# Patient Record
Sex: Male | Born: 1935 | Race: White | Hispanic: No | Marital: Married | Smoking: Former smoker
Health system: Southern US, Community
[De-identification: ages and names within clinical notes are randomized; demographics above are authoritative.]

## PROBLEM LIST (undated history)

## (undated) DIAGNOSIS — H269 Unspecified cataract: Secondary | ICD-10-CM

## (undated) DIAGNOSIS — Z8739 Personal history of other diseases of the musculoskeletal system and connective tissue: Secondary | ICD-10-CM

## (undated) DIAGNOSIS — N183 Chronic kidney disease, stage 3 unspecified: Secondary | ICD-10-CM

## (undated) DIAGNOSIS — I714 Abdominal aortic aneurysm, without rupture, unspecified: Secondary | ICD-10-CM

## (undated) DIAGNOSIS — M1990 Unspecified osteoarthritis, unspecified site: Secondary | ICD-10-CM

## (undated) DIAGNOSIS — M109 Gout, unspecified: Secondary | ICD-10-CM

## (undated) DIAGNOSIS — E785 Hyperlipidemia, unspecified: Secondary | ICD-10-CM

## (undated) DIAGNOSIS — Z8719 Personal history of other diseases of the digestive system: Secondary | ICD-10-CM

## (undated) DIAGNOSIS — Z923 Personal history of irradiation: Secondary | ICD-10-CM

## (undated) DIAGNOSIS — I10 Essential (primary) hypertension: Secondary | ICD-10-CM

## (undated) DIAGNOSIS — Z8619 Personal history of other infectious and parasitic diseases: Secondary | ICD-10-CM

## (undated) DIAGNOSIS — I471 Supraventricular tachycardia: Secondary | ICD-10-CM

## (undated) DIAGNOSIS — I4891 Unspecified atrial fibrillation: Secondary | ICD-10-CM

## (undated) DIAGNOSIS — I739 Peripheral vascular disease, unspecified: Secondary | ICD-10-CM

## (undated) DIAGNOSIS — Z8673 Personal history of transient ischemic attack (TIA), and cerebral infarction without residual deficits: Secondary | ICD-10-CM

## (undated) DIAGNOSIS — C61 Malignant neoplasm of prostate: Secondary | ICD-10-CM

## (undated) DIAGNOSIS — I4719 Other supraventricular tachycardia: Secondary | ICD-10-CM

## (undated) DIAGNOSIS — D649 Anemia, unspecified: Secondary | ICD-10-CM

## (undated) DIAGNOSIS — E119 Type 2 diabetes mellitus without complications: Secondary | ICD-10-CM

## (undated) DIAGNOSIS — K7460 Unspecified cirrhosis of liver: Secondary | ICD-10-CM

## (undated) HISTORY — DX: Personal history of irradiation: Z92.3

## (undated) HISTORY — DX: Essential (primary) hypertension: I10

## (undated) HISTORY — DX: Personal history of other diseases of the digestive system: Z87.19

## (undated) HISTORY — PX: APPENDECTOMY: SHX54

## (undated) HISTORY — PX: COLON SURGERY: SHX602

## (undated) HISTORY — DX: Chronic kidney disease, stage 3 (moderate): N18.3

## (undated) HISTORY — DX: Abdominal aortic aneurysm, without rupture, unspecified: I71.40

## (undated) HISTORY — PX: CAROTID ENDARTERECTOMY: SUR193

## (undated) HISTORY — PX: CHOLECYSTECTOMY: SHX55

## (undated) HISTORY — DX: Gout, unspecified: M10.9

## (undated) HISTORY — DX: Unspecified cataract: H26.9

## (undated) HISTORY — PX: PARTIAL COLECTOMY: SHX5273

## (undated) HISTORY — DX: Unspecified atrial fibrillation: I48.91

## (undated) HISTORY — DX: Other supraventricular tachycardia: I47.19

## (undated) HISTORY — DX: Supraventricular tachycardia: I47.1

## (undated) HISTORY — DX: Personal history of other diseases of the musculoskeletal system and connective tissue: Z87.39

## (undated) HISTORY — DX: Chronic kidney disease, stage 3 unspecified: N18.30

## (undated) HISTORY — DX: Abdominal aortic aneurysm, without rupture: I71.4

## (undated) HISTORY — DX: Personal history of transient ischemic attack (TIA), and cerebral infarction without residual deficits: Z86.73

## (undated) HISTORY — DX: Type 2 diabetes mellitus without complications: E11.9

## (undated) HISTORY — DX: Personal history of other infectious and parasitic diseases: Z86.19

## (undated) HISTORY — DX: Anemia, unspecified: D64.9

## (undated) HISTORY — DX: Hyperlipidemia, unspecified: E78.5

---

## 1979-05-16 HISTORY — PX: ABDOMINAL AORTIC ANEURYSM REPAIR: SUR1152

## 2009-05-31 ENCOUNTER — Ambulatory Visit: Admission: RE | Admit: 2009-05-31 | Discharge: 2009-08-22 | Payer: Self-pay | Admitting: Radiation Oncology

## 2009-09-17 ENCOUNTER — Encounter: Payer: Self-pay | Admitting: Cardiology

## 2009-12-19 ENCOUNTER — Encounter: Payer: Self-pay | Admitting: Cardiology

## 2009-12-20 ENCOUNTER — Ambulatory Visit: Payer: Self-pay | Admitting: Cardiology

## 2009-12-21 ENCOUNTER — Encounter: Payer: Self-pay | Admitting: Cardiology

## 2009-12-22 ENCOUNTER — Encounter: Payer: Self-pay | Admitting: Cardiology

## 2010-01-14 ENCOUNTER — Ambulatory Visit: Payer: Self-pay | Admitting: Cardiology

## 2010-01-14 DIAGNOSIS — I714 Abdominal aortic aneurysm, without rupture, unspecified: Secondary | ICD-10-CM | POA: Insufficient documentation

## 2010-01-14 DIAGNOSIS — I471 Supraventricular tachycardia: Secondary | ICD-10-CM | POA: Insufficient documentation

## 2010-01-14 DIAGNOSIS — N183 Chronic kidney disease, stage 3 unspecified: Secondary | ICD-10-CM | POA: Insufficient documentation

## 2010-01-14 DIAGNOSIS — I10 Essential (primary) hypertension: Secondary | ICD-10-CM | POA: Insufficient documentation

## 2010-01-14 DIAGNOSIS — E785 Hyperlipidemia, unspecified: Secondary | ICD-10-CM | POA: Insufficient documentation

## 2010-08-14 ENCOUNTER — Ambulatory Visit: Payer: Self-pay | Admitting: Cardiology

## 2010-08-29 ENCOUNTER — Encounter: Payer: Self-pay | Admitting: Cardiology

## 2010-10-14 NOTE — Consult Note (Signed)
Summary: [facility]OLOG[facility]ULT/ [facility]  [facility]PH[facility]OLOGY CONSULT/ [facility]   Imported By: Zachary George 01/14/2010 08:32:33  _____________________________________________________________________  External Attachment:    Type:   Image     Comment:   External Document

## 2010-10-14 NOTE — Assessment & Plan Note (Signed)
Summary: POST [facility] F/U-[facility]   Visit Type:  Follow-up Primary Provider:  Dr. Kyra Manges   History of Present Illness: 77 year old male presents to the office for the first time. He was seen in consultation at [facility] in early April. He had evidence of a supraventricular tachycardia at that time associated with hypotension. 2-D echocardiography is reviewed below. He was seen by nephrology for renal insufficiency, and also underwent a ventilation perfusion lung scan which was low probability for pulmonary embolus.  He reports doing well since discharge. Home blood pressure and heart rate checks have been stable with systolics generally ranging between 100 and 130, and heart rates in the 80s. He has had no palpitations or dizziness.  I reviewed his medications. It is not entirely clear whether his metoprolol is short acting oral long-acting, however he is taking it once a day. We will try to clarify this.  Preventive Screening-Counseling & Management  Alcohol-Tobacco     Smoking Status: current  Current Medications (verified): 1)  Simvastatin 40 Mg Tabs (Simvastatin) .... Take 1 Tab Daily 2)  Lopressor 50 Mg Tabs (Metoprolol Tartrate) .... Take 1 Tab Daily 3)  Quinapril Hcl 10 Mg Tabs (Quinapril Hcl) .... Take 1 Tab Daily 4)  Gemfibrozil 600 Mg Tabs (Gemfibrozil) .... Take 1 Tab Two Times A Day 5)  Hydrochlorothiazide 50 Mg Tabs (Hydrochlorothiazide) .... Take 1 Tab Daily 6)  Bicalutamide 50 Mg Tabs (Bicalutamide) .... Take 1 Tab Daily 7)  Calcium 600 600 Mg Tabs (Calcium Carbonate) .... Take 1 Tab Daily 8)  Aspir-Low 81 Mg Tbec (Aspirin) .... Take 1 Tab Daily 9)  Daily Multiple Vitamins  Tabs (Multiple Vitamin) .... Take 1 Tab Daily  Allergies (verified): 1)  ! Morphine  Past History:  Family History: Last updated: 01/14/2010 Mother: cancer Atrial fibrillation and other family members No obvious premature cardiovascular disease  Social History: Last updated:  01/14/2010 Married  Tobacco Use - Yes Alcohol Use - yes (occasional) Relocated here from [place]  Past Medical History: Aneurysm-Aortic Hyperlipidemia Hypertension Gouty arthritis Bilateral hearing loss Chronic renal insufficiency Prostate cancer SVT  Past Surgical History: Cholecystectomy Abdominal aortic aneurysm repair, 30 years ago ([place])  Family History: Mother: cancer Atrial fibrillation and other family members No obvious premature cardiovascular disease  Social History: Married  Tobacco Use - Yes Alcohol Use - yes (occasional) [place]  [place]  Review of Systems  The patient denies anorexia, fever, weight gain, chest pain, syncope, dyspnea on exertion, peripheral edema, melena, hematochezia, and severe indigestion/heartburn.         Otherwise reviewed and negative.  Vital Signs:  Patient profile:   77 year old male Height:      71 inches Weight:      176 pounds BMI:     24.64 Pulse rate:   89 / minute BP sitting:   115 / 63  (right arm)  Vitals Entered By: Dreama Saa, CNA (Jan 14, 2010 2:11 PM)  Physical Exam  Additional Exam:  Overweight male in no acute distress. HEENT: Conjunctiva and lids normal, oropharynx clear. Neck: Supple, no elevated JVP or loud carotid bruits. Lungs: Clear to auscultation, nonlabored. Cardiac: Regular rate and rhythm, no S3. Abdomen: Soft, nontender, bowel sounds present. Extremities: No pitting edema, distal pulses 1-2+.   Echocardiogram  Procedure date:  12/20/2009  Findings:      Mild LVH with LVEF 60-65%, diastolic dysfunction, trace mitral regurgitation, aortic valve sclerosis without stenosis, mild to  moderate right ventricular enlargement with mildly reduced function, mild right atrial enlargement, RVSP 27 mm mercury.  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 No obvious recurrence since discharge from the hospital. I did ask the patient to verify his metoprolol type. They will call us with the full name. He should be on a long acting formulation if he is taking it once daily. Followup in 6 months.  His updated medication list for this problem includes:    Lopressor 50 Mg Tabs (Metoprolol tartrate) .Marland Kitchen... Take 1 tab daily    Quinapril Hcl 10 Mg Tabs (Quinapril hcl) .Marland Kitchen... Take 1 tab daily    Aspir-low 81 Mg Tbec (Aspirin) .Marland Kitchen... Take 1 tab daily  Problem # 2:  ESSENTIAL HYPERTENSION, BENIGN (ICD-401.1)  Blood pressure stable as an outpatient, and well-controlled today.  His updated medication list for this problem includes:    Lopressor 50 Mg Tabs (Metoprolol tartrate) .Marland Kitchen... Take 1 tab daily    Quinapril Hcl 10 Mg Tabs (Quinapril hcl) .Marland Kitchen... Take 1 tab daily    Hydrochlorothiazide 50 Mg Tabs (Hydrochlorothiazide) .Marland Kitchen... Take 1 tab daily    Aspir-low 81 Mg Tbec (Aspirin) .Marland Kitchen... Take 1 tab daily  Patient Instructions: 1)  Your physician wants you to follow-up in: 6 months. You will receive a reminder letter in the mail one-two months in advance. If you don't receive a letter, please call our office to schedule the follow-up appointment. 2)  Please call the office to notify the nurse Antony Contras) of what type of metoprolol you are taking.  Appended Document: [facility] Pt's family member called back to clarify that Metoprolol is tartrate not succinate. She can be reached at [phone].  Appended Document: [facility]/U-[facility] Suggest Toprol XL 50 mg by mouth once daily - this is also what was listed on the hospital D/C summary.  Appended Document: POST [facility]/U-[facility] Pt's wife notified and verbalized understanding.   Clinical Lists Changes  Medications: Changed medication from LOPRESSOR 50 MG TABS (METOPROLOL TARTRATE) take 1 tab daily to METOPROLOL SUCCINATE 50 MG XR24H-TAB (METOPROLOL SUCCINATE) Take one tablet by mouth daily -  Signed Rx of METOPROLOL SUCCINATE 50 MG XR24H-TAB (METOPROLOL SUCCINATE) Take one tablet by mouth daily;  #30 x 3;  Signed;  Entered by: Cyril Loosen, RN, BSN;  Authorized by: Loreli Slot, MD, [facility];  Method used: Electronically to [facility] [phone]*, [address] [place], [place], [place]  [address], Ph: [phone] or [phone], Fax: [phone]    Prescriptions: METOPROLOL SUCCINATE 50 MG XR24H-TAB (METOPROLOL SUCCINATE) Take one tablet by mouth daily  #30 x 3   Entered by:   Cyril Loosen, RN, BSN   Authorized by:   Loreli Slot, MD, [facility]   Signed by:   Cyril Loosen, RN, BSN on 01/15/2010   Method used:   Electronically to        [facility] [phone]* (retail)       [address]       [place], [place]  [address]       Ph: [phone] or [phone]       Fax: [phone]   RxID:   [id number]

## 2010-10-14 NOTE — Letter (Signed)
Summary: [facility] D[facility]C DR. Wende Crease  [facility]C DR. Wende Crease   Imported By: Zachary George 01/14/2010 08:32:03  _____________________________________________________________________  External Attachment:    Type:   Image     Comment:   External Document

## 2010-10-14 NOTE — Letter (Signed)
Summary: Appointment -missed  [facility] [address]   [place], [place] [address]   Phone: [phone]  Fax: [phone]     August 14, 2010 MRN: [id number]     Erik Mullins [address] [place], [place]  [address]     Dear [name],  Our records indicate you missed your appointment on August 14, 2010                        with Dr.   Diona Browner .   It is very important that we reach you to reschedule this appointment. We look forward to participating in your health care needs.   Please contact us at the number listed above at your earliest convenience to reschedule this appointment.   Sincerely,    Glass blower/designer

## 2010-10-14 NOTE — Consult Note (Signed)
Summary: CARDIOLOGY CONSULT/ [facility]  [facility]OLOGY [facility]/ [facility]   Imported By: Zachary George 01/14/2010 08:29:22  _____________________________________________________________________  External Attachment:    Type:   Image     Comment:   External Document

## 2010-10-14 NOTE — Assessment & Plan Note (Signed)
Summary: 6 MO FUL   Visit Type:  Follow-up Primary Provider:  Dr. Kyra Manges   History of Present Illness: 77 year old male presents for followup. He was seen back in May of this year. He reports doing well, feeling better with more energy. He continues to play golf regularly, in fact walks some of the holes. He is not reporting any angina, no significant palpitations, dizziness, or syncope. Home blood pressure checks are actually a bit on the high side, but today's blood pressure looked quite good.  He reports compliance with his medications. States he is due to see Dr. Elana Alm later this month for a full battery of labs.  Clinical Review Panels:  Echocardiogram Echocardiogram Mild LVH with LVEF 60-65%, diastolic dysfunction, trace mitral regurgitation, aortic valve sclerosis without stenosis, mild to moderate right ventricular enlargement with mildly reduced function, mild right atrial enlargement, RVSP 27 mm mercury. (12/20/2009)    Preventive Screening-Counseling & Management  Alcohol-Tobacco     Smoking Status: current     Smoking Cessation Counseling: yes     Packs/Day: 1/2 PPD  Current Medications (verified): 1)  Simvastatin 40 Mg Tabs (Simvastatin) .... Take 1 Tab Daily 2)  Metoprolol Tartrate 25 Mg Tabs (Metoprolol Tartrate) .... Take 1 Tablet By Mouth Once A Day 3)  Quinapril Hcl 10 Mg Tabs (Quinapril Hcl) .... Take 1 Tab Daily 4)  Gemfibrozil 600 Mg Tabs (Gemfibrozil) .... Take 1 Tab Two Times A Day 5)  Hydrochlorothiazide 25 Mg Tabs (Hydrochlorothiazide) .... Take 1 Tablet By Mouth Once A Day 6)  Bicalutamide 50 Mg Tabs (Bicalutamide) .... Take 1 Tab Daily 7)  Caltrate 600+d Plus 600-400 Mg-Unit Tabs (Calcium Carbonate-Vit D-Min) .... Take 1 Tablet By Mouth Once A Day 8)  Aspir-Low 81 Mg Tbec (Aspirin) .... Take 1 Tab Daily 9)  Daily Multiple Vitamins  Tabs (Multiple Vitamin) .... Take 1 Tab Daily 10)  Allopurinol 100 Mg Tabs (Allopurinol) .... Take 1 Tablet By Mouth  Once A Day  Allergies (verified): 1)  ! Morphine  Comments:  Nurse/Medical Assistant: The patient's medication list and allergies were reviewed with the patient and were updated in the Medication and Allergy Lists.  Past History:  Social History: Last updated: 08/14/2010 Married  Relocated to this area from [place] Tobacco Use - Yes Alcohol Use - yes  Past Medical History: Hypertension Prostate Cancer (in remission) Hyperlipidemia Remote AAA (repair approx. 30 yrs ago in [place]) Anemia Tobacco Use Disorder Kidney Disease History of gout Possible AVNRT  Social History: Married  Relocated to this area from [place] Tobacco Use - Yes Alcohol Use - yes Packs/Day:  1/2 PPD  Review of Systems  The patient denies anorexia, fever, chest pain, syncope, dyspnea on exertion, peripheral edema, melena, and hematochezia.         Otherwise reviewed and negative.  Vital Signs:  Patient profile:   77 year old male Height:      71 inches Weight:      174 pounds Pulse rate:   83 / minute BP sitting:   114 / 79  (left arm) Cuff size:   large  Vitals Entered By: Carlye Grippe (August 14, 2010 11:39 AM)  Physical Exam  Additional Exam:  Overweight male in no acute distress. HEENT: Conjunctiva and lids normal, oropharynx clear. Neck: Supple, no elevated JVP or loud carotid bruits. Lungs: Clear to auscultation, nonlabored. Cardiac: Regular rate and rhythm, no S3. Abdomen: Soft, nontender, bowel sounds present. Extremities: No pitting edema, distal pulses  1-2+.   EKG  Procedure date:  08/14/2010  Findings:      Sinus rhythm at 68 beats per minute with occasional PACs and sinus arrhythmia.  Impression & Recommendations:  Problem # 1:  PSVT (ICD-427.0)  Well-controlled on present regimen. Followup in 6 months, sooner if needed.  His updated medication list for this problem includes:    Metoprolol Tartrate 25 Mg Tabs (Metoprolol  tartrate) .Marland Kitchen... Take 1 tablet by mouth once a day    Quinapril Hcl 10 Mg Tabs (Quinapril hcl) .Marland Kitchen... Take 1 tab daily    Aspir-low 81 Mg Tbec (Aspirin) .Marland Kitchen... Take 1 tab daily  Problem # 2:  ESSENTIAL HYPERTENSION, BENIGN (ICD-401.1)  Blood pressure well-controlled today, no changes made.  His updated medication list for this problem includes:    Metoprolol Tartrate 25 Mg Tabs (Metoprolol tartrate) .Marland Kitchen... Take 1 tablet by mouth once a day    Quinapril Hcl 10 Mg Tabs (Quinapril hcl) .Marland Kitchen... Take 1 tab daily    Hydrochlorothiazide 25 Mg Tabs (Hydrochlorothiazide) .Marland Kitchen... Take 1 tablet by mouth once a day    Aspir-low 81 Mg Tbec (Aspirin) .Marland Kitchen... Take 1 tab daily  Orders: EKG w/ Interpretation (93000)  Problem # 3:  HYPERLIPIDEMIA (ICD-272.4)  Patient has followup with primary care later in the month for repeat lab work.  His updated medication list for this problem includes:    Simvastatin 40 Mg Tabs (Simvastatin) .Marland Kitchen... Take 1 tab daily    Gemfibrozil 600 Mg Tabs (Gemfibrozil) .Marland Kitchen... Take 1 tab two times a day  Patient Instructions: 1)  Your physician wants you to follow-up in: 6 months. You will receive a reminder letter in the mail one-two months in advance. If you don't receive a letter, please call our office to schedule the follow-up appointment. 2)  Your physician recommends that you continue on your current medications as directed. Please refer to the Current Medication list given to you today.

## 2010-10-14 NOTE — Letter (Signed)
Summary: [facility]  [facility]   Imported By: Zachary George 01/14/2010 08:33:03  _____________________________________________________________________  External Attachment:    Type:   Image     Comment:   External Document

## 2010-12-14 DEATH — deceased

## 2011-01-06 ENCOUNTER — Encounter: Payer: Self-pay | Admitting: Cardiology

## 2011-02-24 ENCOUNTER — Encounter: Payer: Self-pay | Admitting: Cardiology

## 2011-02-24 ENCOUNTER — Ambulatory Visit: Payer: Self-pay | Admitting: Cardiology

## 2011-03-10 ENCOUNTER — Encounter: Payer: Self-pay | Admitting: Cardiology

## 2011-03-10 ENCOUNTER — Ambulatory Visit (INDEPENDENT_AMBULATORY_CARE_PROVIDER_SITE_OTHER): Payer: Medicare Other | Admitting: Cardiology

## 2011-03-10 VITALS — BP 104/70 | HR 92 | Ht 71.0 in | Wt 167.0 lb

## 2011-03-10 DIAGNOSIS — R42 Dizziness and giddiness: Secondary | ICD-10-CM

## 2011-03-10 DIAGNOSIS — I471 Supraventricular tachycardia: Secondary | ICD-10-CM

## 2011-03-10 DIAGNOSIS — E785 Hyperlipidemia, unspecified: Secondary | ICD-10-CM

## 2011-03-10 DIAGNOSIS — I10 Essential (primary) hypertension: Secondary | ICD-10-CM

## 2011-03-10 NOTE — Progress Notes (Signed)
Clinical Summary Erik Mullins is a 77 y.o.male presenting for followup. He was seen in December 2011. Previously followed by Dr. Elana Alm.  He describes episodes of weakness and mild dizziness with documented low blood pressures, systolics sometimes in the 80-100 range. He still plays golf regularly, even in the hot weather, admits that he does not drink fluids regularly. He is on a diuretic as noted below, and it is certainly likely that he has propensity for relative dehydration and symptomatically hypotension. We discussed this today.  Otherwise his blood pressure is very well controlled. He reports no significant palpitations or chest pain. No reported claudication symptoms.   Allergies  Allergen Reactions  . Morphine     Current outpatient prescriptions:allopurinol (ZYLOPRIM) 100 MG tablet, Take 100 mg by mouth daily.  , Disp: , Rfl: ;  aspirin (ASPIR-LOW) 81 MG EC tablet, Take 81 mg by mouth daily.  , Disp: , Rfl: ;  bicalutamide (CASODEX) 50 MG tablet, Take 50 mg by mouth daily.  , Disp: , Rfl: ;  Calcium Carbonate-Vitamin D (CALTRATE 600+D) 600-400 MG-UNIT per tablet, Take 1 tablet by mouth daily.  , Disp: , Rfl:  DAILY MULTIPLE VITAMINS PO, Take by mouth. 1 tablet daily , Disp: , Rfl: ;  gemfibrozil (LOPID) 600 MG tablet, Take 600 mg by mouth 2 (two) times daily.  , Disp: , Rfl: ;  hydrochlorothiazide 25 MG tablet, Take 25 mg by mouth daily.  , Disp: , Rfl: ;  metoprolol tartrate (LOPRESSOR) 25 MG tablet, Take 25 mg by mouth daily.  , Disp: , Rfl: ;  quinapril (ACCUPRIL) 10 MG tablet, Take 10 mg by mouth daily.  , Disp: , Rfl:  simvastatin (ZOCOR) 40 MG tablet, Take 40 mg by mouth daily.  , Disp: , Rfl:   Past Medical History  Diagnosis Date  . Essential hypertension, benign   . History of prostate cancer   . Hyperlipidemia   . Anemia   . Chronic kidney disease   . History of gout   . 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 AAA (abdominal aortic aneurysm)     Remote     Social History Erik Mullins reports that he has been smoking Cigarettes.  He has a 27.5 pack-year smoking history. He quit smokeless tobacco use about 51 years ago. His smokeless tobacco use included Chew. Erik Mullins reports that he drinks alcohol.  Review of Systems Otherwise negative except as outlined.  Physical Examination Filed Vitals:   03/10/11 0900  BP: 104/70  Pulse: 92   Overweight male in no acute distress.  HEENT: Conjunctiva and lids normal, oropharynx clear.  Neck: Supple, no elevated JVP or loud carotid bruits.  Lungs: Clear to auscultation, nonlabored.  Cardiac: Regular rate and rhythm, no S3.  Abdomen: Soft, nontender, bowel sounds present.  Skin: Scattered tattoos, no ulcerations. Extremities: No pitting edema, distal pulses 1-2+. Musculoskeletal: No kyphosis. Neuropsychiatric: Alert and oriented x3, affect appropriate.   ECG Normal sinus rhythm at 78 beats per minute.  Studies Echocardiogram 12/20/2009: Mild LVH with LVEF 60-65%, diastolic dysfunction, trace mitral regurgitation, aortic valve sclerosis without stenosis, mild to moderate right ventricular enlargement with mildly reduced function, mild right atrial enlargement, RVSP 27 mm mercury.  Problem List and Plan

## 2011-03-10 NOTE — Assessment & Plan Note (Signed)
Suspect that this is related to symptomatic hypotension in the setting of relative dehydration. We reviewed his antihypertensive regimen, and at this point plan to discontinue hydrochlorothiazide. He will continue to check blood pressures at home and let us know how he is doing.

## 2011-03-10 NOTE — Patient Instructions (Signed)
Follow up as scheduled. Stop HCTZ Your physician discussed the hazards of tobacco use. Tobacco use cessation is recommended and techniques and options to help you quit were discussed.

## 2011-03-10 NOTE — Assessment & Plan Note (Signed)
Patient continues followup with primary care.

## 2011-03-10 NOTE — Assessment & Plan Note (Signed)
Symptomatically stable without any significant palpitations. ECG shows normal sinus rhythm. Continue beta blocker therapy.

## 2011-03-10 NOTE — Assessment & Plan Note (Signed)
Seems to be very well controlled.

## 2011-06-09 ENCOUNTER — Encounter: Payer: Self-pay | Admitting: Cardiology

## 2011-06-10 ENCOUNTER — Ambulatory Visit (INDEPENDENT_AMBULATORY_CARE_PROVIDER_SITE_OTHER): Payer: Medicare Other | Admitting: Cardiology

## 2011-06-10 ENCOUNTER — Encounter: Payer: Self-pay | Admitting: Cardiology

## 2011-06-10 VITALS — BP 133/72 | HR 77 | Resp 16 | Ht 71.0 in | Wt 168.0 lb

## 2011-06-10 DIAGNOSIS — R42 Dizziness and giddiness: Secondary | ICD-10-CM

## 2011-06-10 DIAGNOSIS — I471 Supraventricular tachycardia: Secondary | ICD-10-CM

## 2011-06-10 DIAGNOSIS — I10 Essential (primary) hypertension: Secondary | ICD-10-CM

## 2011-06-10 NOTE — Progress Notes (Signed)
Clinical Summary [name] is a 77 y.o.male presenting for followup. He was seen back in June.  He reports feeling better, less lightheadedness since discontinuing diuretic for his blood pressure. He brings in a home blood pressure log that shows no sustained hypotension, one systolic blood pressure in the 90s. He has had a high systolic pressure in the 140s on one occasion. I asked him to keep an eye on this.  He reports no significant palpitations. Continues to play golf regularly.   Allergies  Allergen Reactions  . Morphine     Medication list reviewed.  Past Medical History  Diagnosis Date  . Essential hypertension, benign   . History of prostate cancer   . Hyperlipidemia   . Anemia   . Chronic kidney disease   . History of gout   . 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 AAA (abdominal aortic aneurysm)     Remote    Past Surgical History  Procedure Date  . Abdominal aortic aneurysm repair     Approximately 30 yrs ago in [place]  . Partial colectomy   . Cholecystectomy     Family History  Problem Relation Age of Onset  . Cancer Mother   . Arrhythmia Other     Atrial fibrillation    Social History [name] reports that he has been smoking Cigarettes.  He has a 27.5 pack-year smoking history. He quit smokeless tobacco use about 51 years ago. His smokeless tobacco use included Chew. [name] reports that he drinks alcohol.  Review of Systems Otherwise review negative except as outlined.  Physical Examination Filed Vitals:   06/10/11 1342  BP: 133/72  Pulse: 77  Resp: 16   Overweight male in no acute distress.  HEENT: Conjunctiva and lids normal, oropharynx clear.  Neck: Supple, no elevated JVP or loud carotid bruits.  Lungs: Clear to auscultation, nonlabored.  Cardiac: Regular rate and rhythm, no S3.  Abdomen: Soft, nontender, bowel sounds present.  Skin: Scattered tattoos, no ulcerations.  Extremities: No pitting edema,  distal pulses 1-2+.  Musculoskeletal: No kyphosis.  Neuropsychiatric: Alert and oriented x3, affect appropriate.   Studies Echocardiogram 12/20/2009:  Mild LVH with LVEF 60-65%, diastolic dysfunction, trace mitral regurgitation, aortic valve sclerosis without stenosis, mild to moderate right ventricular enlargement with mildly reduced function, mild right atrial enlargement, RVSP 27 mm mercury.   Problem List and Plan

## 2011-06-10 NOTE — Assessment & Plan Note (Signed)
No changes made to present regimen. Continue home checks.

## 2011-06-10 NOTE — Assessment & Plan Note (Signed)
Quiescent, no significant palpitations. Continue beta blocker therapy.

## 2011-06-10 NOTE — Patient Instructions (Signed)
Your physician recommends that you schedule a follow-up appointment in:6 months. You will receive a reminder letter in the mail about 1-2 months in advance reminding you to call our office and schedule your appointment. If you don't receive this letter, please call our office. Your physician recommends that you continue on your current medications as directed. Please refer to the Current Medication list given to you today.

## 2011-06-10 NOTE — Assessment & Plan Note (Signed)
Resolved following discontinuation of diuretic.

## 2011-09-16 DIAGNOSIS — R059 Cough, unspecified: Secondary | ICD-10-CM | POA: Diagnosis not present

## 2011-09-16 DIAGNOSIS — R05 Cough: Secondary | ICD-10-CM | POA: Diagnosis not present

## 2011-09-16 DIAGNOSIS — J069 Acute upper respiratory infection, unspecified: Secondary | ICD-10-CM | POA: Diagnosis not present

## 2011-09-29 DIAGNOSIS — J209 Acute bronchitis, unspecified: Secondary | ICD-10-CM | POA: Diagnosis not present

## 2011-10-13 DIAGNOSIS — M109 Gout, unspecified: Secondary | ICD-10-CM | POA: Diagnosis not present

## 2011-10-13 DIAGNOSIS — N189 Chronic kidney disease, unspecified: Secondary | ICD-10-CM | POA: Diagnosis not present

## 2011-10-13 DIAGNOSIS — I10 Essential (primary) hypertension: Secondary | ICD-10-CM | POA: Diagnosis not present

## 2011-10-13 DIAGNOSIS — E785 Hyperlipidemia, unspecified: Secondary | ICD-10-CM | POA: Diagnosis not present

## 2011-12-08 ENCOUNTER — Encounter: Payer: Self-pay | Admitting: Cardiology

## 2011-12-08 ENCOUNTER — Ambulatory Visit (INDEPENDENT_AMBULATORY_CARE_PROVIDER_SITE_OTHER): Payer: Medicare Other | Admitting: Cardiology

## 2011-12-08 VITALS — BP 132/73 | HR 75 | Ht 71.0 in | Wt 183.0 lb

## 2011-12-08 DIAGNOSIS — I10 Essential (primary) hypertension: Secondary | ICD-10-CM | POA: Diagnosis not present

## 2011-12-08 DIAGNOSIS — I471 Supraventricular tachycardia: Secondary | ICD-10-CM | POA: Diagnosis not present

## 2011-12-08 NOTE — Progress Notes (Signed)
   Clinical Summary Mr. Brickle is a 78 y.o.male presenting for followup. He was seen in September 2012. He reports no progressive palpitations, occasionally feels a brief episode when he lies down at night time. Otherwise during the daytime hours he reports no dizziness with exertion, no syncope. Plays golf most days of the week. Followup ECG is reviewed below. He reports compliance with his medications.   Allergies  Allergen Reactions  . Morphine     Current Outpatient Prescriptions  Medication Sig Dispense Refill  . allopurinol (ZYLOPRIM) 100 MG tablet Take 100 mg by mouth daily.        Marland Kitchen aspirin (ASPIR-LOW) 81 MG EC tablet Take 81 mg by mouth daily.        . Calcium Carbonate-Vitamin D (CALTRATE 600+D) 600-400 MG-UNIT per tablet Take 1 tablet by mouth daily.        Marland Kitchen DAILY MULTIPLE VITAMINS PO Take by mouth. 1 tablet daily       . metoprolol tartrate (LOPRESSOR) 25 MG tablet Take 25 mg by mouth daily.        . quinapril (ACCUPRIL) 10 MG tablet Take 10 mg by mouth daily.        . simvastatin (ZOCOR) 20 MG tablet Take 20 mg by mouth every evening.      Marland Kitchen ZETIA 10 MG tablet Take 1 tablet by mouth Daily.        Past Medical History  Diagnosis Date  . Essential hypertension, benign   . History of prostate cancer   . Hyperlipidemia   . Anemia   . Chronic kidney disease   . History of gout   . 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 AAA (abdominal aortic aneurysm)     Remote    Social History Mr. Lewelling reports that he has been smoking Cigarettes.  He has a 27.5 pack-year smoking history. He quit smokeless tobacco use about 52 years ago. His smokeless tobacco use included Chew. Mr. Salzman reports that he drinks alcohol.  Review of Systems Has noticed some flare of psoriasis. Has gained weight, feels more hungry after cutting back on smoking. No orthopnea or PND. Otherwise negative.  Physical Examination Filed Vitals:   12/08/11 1347  BP: 132/73  Pulse: 75    Overweight male in no acute distress.  HEENT: Conjunctiva and lids normal, oropharynx clear.  Neck: Supple, no elevated JVP or loud carotid bruits.  Lungs: Clear to auscultation, nonlabored.  Cardiac: Regular rate and rhythm, no S3.  Abdomen: Soft, nontender, bowel sounds present.  Skin: Scattered tattoos, no ulcerations.  Extremities: No pitting edema, distal pulses 1-2+.    ECG Ectopic atrial rhythm with PACs.   Problem List and Plan

## 2011-12-08 NOTE — Assessment & Plan Note (Addendum)
Continue present medical regimen and followup with Dr. Modesto Charon. We discussed diet and exercise.

## 2011-12-08 NOTE — Assessment & Plan Note (Signed)
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 Followup ECG reviewed. For now will continue observation and plan annual followup.

## 2011-12-08 NOTE — Patient Instructions (Signed)
Your physician you to follow up in 1 year. You will receive a reminder letter in the mail one-two months in advance. If you don't receive a letter, please call our office to schedule the follow-up appointment. Your physician recommends that you continue on your current medications as directed. Please refer to the Current Medication list given to you today. 

## 2012-02-02 DIAGNOSIS — E785 Hyperlipidemia, unspecified: Secondary | ICD-10-CM | POA: Diagnosis not present

## 2012-02-02 DIAGNOSIS — I10 Essential (primary) hypertension: Secondary | ICD-10-CM | POA: Diagnosis not present

## 2012-02-02 DIAGNOSIS — E291 Testicular hypofunction: Secondary | ICD-10-CM | POA: Diagnosis not present

## 2012-02-02 DIAGNOSIS — M109 Gout, unspecified: Secondary | ICD-10-CM | POA: Diagnosis not present

## 2012-02-02 DIAGNOSIS — Z125 Encounter for screening for malignant neoplasm of prostate: Secondary | ICD-10-CM | POA: Diagnosis not present

## 2012-02-09 DIAGNOSIS — E785 Hyperlipidemia, unspecified: Secondary | ICD-10-CM | POA: Diagnosis not present

## 2012-02-09 DIAGNOSIS — M109 Gout, unspecified: Secondary | ICD-10-CM | POA: Diagnosis not present

## 2012-02-16 DIAGNOSIS — E291 Testicular hypofunction: Secondary | ICD-10-CM | POA: Diagnosis not present

## 2012-02-16 DIAGNOSIS — C61 Malignant neoplasm of prostate: Secondary | ICD-10-CM | POA: Diagnosis not present

## 2012-06-06 DIAGNOSIS — L03211 Cellulitis of face: Secondary | ICD-10-CM | POA: Diagnosis not present

## 2012-06-06 DIAGNOSIS — L0201 Cutaneous abscess of face: Secondary | ICD-10-CM | POA: Diagnosis not present

## 2012-06-09 DIAGNOSIS — E785 Hyperlipidemia, unspecified: Secondary | ICD-10-CM | POA: Diagnosis not present

## 2012-06-09 DIAGNOSIS — N289 Disorder of kidney and ureter, unspecified: Secondary | ICD-10-CM | POA: Diagnosis not present

## 2012-06-09 DIAGNOSIS — L03211 Cellulitis of face: Secondary | ICD-10-CM | POA: Diagnosis not present

## 2012-06-09 DIAGNOSIS — L0201 Cutaneous abscess of face: Secondary | ICD-10-CM | POA: Diagnosis not present

## 2012-06-09 DIAGNOSIS — I10 Essential (primary) hypertension: Secondary | ICD-10-CM | POA: Diagnosis not present

## 2012-06-27 DIAGNOSIS — L0390 Cellulitis, unspecified: Secondary | ICD-10-CM | POA: Diagnosis not present

## 2012-06-27 DIAGNOSIS — L0291 Cutaneous abscess, unspecified: Secondary | ICD-10-CM | POA: Diagnosis not present

## 2012-07-01 DIAGNOSIS — L0291 Cutaneous abscess, unspecified: Secondary | ICD-10-CM | POA: Diagnosis not present

## 2012-07-01 DIAGNOSIS — L0390 Cellulitis, unspecified: Secondary | ICD-10-CM | POA: Diagnosis not present

## 2012-08-01 DIAGNOSIS — R7989 Other specified abnormal findings of blood chemistry: Secondary | ICD-10-CM | POA: Diagnosis not present

## 2012-08-01 DIAGNOSIS — R509 Fever, unspecified: Secondary | ICD-10-CM | POA: Diagnosis not present

## 2012-08-05 ENCOUNTER — Encounter: Payer: Self-pay | Admitting: Infectious Diseases

## 2012-08-05 ENCOUNTER — Ambulatory Visit (INDEPENDENT_AMBULATORY_CARE_PROVIDER_SITE_OTHER): Payer: Medicare Other | Admitting: Infectious Diseases

## 2012-08-05 VITALS — BP 112/74 | HR 80 | Temp 97.8°F | Ht 71.5 in | Wt 184.2 lb

## 2012-08-05 DIAGNOSIS — N189 Chronic kidney disease, unspecified: Secondary | ICD-10-CM

## 2012-08-05 DIAGNOSIS — R509 Fever, unspecified: Secondary | ICD-10-CM

## 2012-08-05 LAB — CBC
HCT: 41.3 % (ref 39.0–52.0)
MCV: 87.1 fL (ref 78.0–100.0)
Platelets: 183 10*3/uL (ref 150–400)
RBC: 4.74 MIL/uL (ref 4.22–5.81)
RDW: 13.5 % (ref 11.5–15.5)
WBC: 6.9 10*3/uL (ref 4.0–10.5)

## 2012-08-05 LAB — BASIC METABOLIC PANEL
BUN: 27 mg/dL — ABNORMAL HIGH (ref 6–23)
CO2: 27 mEq/L (ref 19–32)
Chloride: 104 mEq/L (ref 96–112)
Potassium: 5.5 mEq/L — ABNORMAL HIGH (ref 3.5–5.3)

## 2012-08-05 MED ORDER — AMOXICILLIN-POT CLAVULANATE 875-125 MG PO TABS: 1.0000 | ORAL_TABLET | Freq: Two times a day (BID) | ORAL | Status: AC

## 2012-08-05 NOTE — Assessment & Plan Note (Signed)
States he has one kidney but normal function. Will check his Cr before CT scan.

## 2012-08-05 NOTE — Assessment & Plan Note (Signed)
Suspect dental source of his infections. At this point, I am unclear if he has developed deeper infection of his maxilla, mandible or both. Will change his anbx to augmentin (this will also cover unusual bugs such as actinomyces and nocardia), plan for him to get a month. Will set him up for a CT maxilofacial with contrast. See him back in 1 week.

## 2012-08-05 NOTE — Progress Notes (Signed)
  Subjective:    Patient ID: Erik Mullins, male    DOB: [birth year], 78 y.o.   MRN: [id number]  HPI 78 yo M with repeated episodes of fever and cellulitis/facial swelling. He was seen 02-09-12 with facial swelling and erythema. He had a temp of 102 and had chills. He was treated with cipro x 10 days. He was seen 06-06-12 with a R posterior ear knot. He was given ceftriaxone, cipro x 10 days. He had another episode 06-27-12 of L jaw pain and facial swelling. He was given ceftriaxone, bactrim. He returned 08-01-12 with temp (to 104?),  fever and erythema of his face.  Was given a shot (?), and then started on bactrim. Has not had any further fevers.  He has been noted to have multiple dental abscesses.   Had wisdom tooth extracted ~ 1 month ago.    Review of Systems  Constitutional: Positive for fever and chills. Negative for appetite change and unexpected weight change.  HENT: Positive for hearing loss and facial swelling.   Gastrointestinal: Negative for diarrhea and constipation.  Genitourinary: Negative for difficulty urinating.  Neurological: Negative for dizziness and headaches.      Objective:   Physical Exam  Constitutional: He appears well-developed and well-nourished.  HENT:  Head:    Mouth/Throat: He has dentures. Abnormal dentition. Uvula swelling and dental caries present. No dental abscesses. No oropharyngeal exudate.    Eyes: EOM are normal. Pupils are equal, round, and reactive to light.  Neck: Neck supple.    Cardiovascular: Normal rate, regular rhythm and normal heart sounds.   Pulmonary/Chest: Effort normal and breath sounds normal.  Abdominal: Soft. Bowel sounds are normal. There is no tenderness.  Musculoskeletal: He exhibits no edema.  Lymphadenopathy:    He has no cervical adenopathy.          Assessment & Plan:

## 2012-08-08 ENCOUNTER — Telehealth: Payer: Self-pay | Admitting: *Deleted

## 2012-08-08 DIAGNOSIS — I10 Essential (primary) hypertension: Secondary | ICD-10-CM | POA: Diagnosis not present

## 2012-08-08 NOTE — Progress Notes (Signed)
Per Dr. Ninetta Lights, ok for patient to have repeat BMP at his appt on Wednesday 08/10/12. Wendall Mola

## 2012-08-08 NOTE — Telephone Encounter (Signed)
Pt needing repeat BMP due to abnormal values.  Erik Mullins requesting BMP be drawn at Dr. Nash Dimmer office today for visit w/ Dr. Ninetta Lights on Wed., Aug 10, 2012.  RN called Dr. Nash Dimmer office and arranged for BMP at their office and for results to be faxed to Dr. Ninetta Lights.  [name] agreed to take the pt to Dr. Nash Dimmer for blood draw today.

## 2012-08-10 ENCOUNTER — Ambulatory Visit (INDEPENDENT_AMBULATORY_CARE_PROVIDER_SITE_OTHER): Payer: Medicare Other | Admitting: Infectious Diseases

## 2012-08-10 ENCOUNTER — Other Ambulatory Visit: Payer: Self-pay | Admitting: Infectious Diseases

## 2012-08-10 ENCOUNTER — Encounter: Payer: Self-pay | Admitting: Infectious Diseases

## 2012-08-10 VITALS — BP 131/81 | HR 89 | Temp 98.2°F | Ht 71.5 in | Wt 183.0 lb

## 2012-08-10 DIAGNOSIS — N189 Chronic kidney disease, unspecified: Secondary | ICD-10-CM | POA: Diagnosis not present

## 2012-08-10 DIAGNOSIS — R509 Fever, unspecified: Secondary | ICD-10-CM | POA: Diagnosis not present

## 2012-08-10 DIAGNOSIS — E876 Hypokalemia: Secondary | ICD-10-CM | POA: Diagnosis not present

## 2012-08-10 DIAGNOSIS — I10 Essential (primary) hypertension: Secondary | ICD-10-CM | POA: Diagnosis not present

## 2012-08-10 NOTE — Progress Notes (Signed)
  Subjective:    Patient ID: Erik Mullins, male    DOB: 05-23-1931, 78 y.o.   MRN: [id number]  HPI 78 yo M with repeated episodes of fever and cellulitis/facial swelling. He was seen 02-09-12 with facial swelling and erythema. He had a temp of 102 and had chills. He was treated with cipro x 10 days. He was seen 06-06-12 with a R posterior ear knot. He was given ceftriaxone, cipro x 10 days. He had another episode 06-27-12 of L jaw pain and facial swelling. He was given ceftriaxone, bactrim. He returned 08-01-12 with temp (to 104?), fever and erythema of his face. Was given a shot (?), and then started on bactrim. Has not had any further fevers.  He has been noted to have multiple dental abscesses.  Had wisdom tooth extracted ~ 1 month ago.  Had BMP for eval for his CT face, found to have Cr 1.68, K 6.1. Has solitary kidney. Has been on accupril for many years. (PMD Leodis Sias, MD). Now trying to eat fewer K+ rich food. Eats a lot of peanut butter, candy bars.  No further fevers since last visit. Facial pain is better. No further erythematous lesions on his face.    Review of Systems     Objective:   Physical Exam  Constitutional: He appears well-developed and well-nourished.  HENT:  Mouth/Throat: No oropharyngeal exudate.    Neck:            Assessment & Plan:

## 2012-08-10 NOTE — Assessment & Plan Note (Signed)
Has improved on augmentin. Plan for 1 month. Will change his CT scan of maxillofacial to without contrast. See him back after CT scan.

## 2012-08-10 NOTE — Assessment & Plan Note (Addendum)
With try to get him into see his PCP. Have him reschedule his CT scan without contrast. Query if his kidney fxn is worse from his ACE-I? His K+ elevated from same? Could be from Augmentin?

## 2012-08-16 ENCOUNTER — Ambulatory Visit (HOSPITAL_COMMUNITY)
Admission: RE | Admit: 2012-08-16 | Discharge: 2012-08-16 | Disposition: A | Discharge status: Home / Self Care | Payer: Medicare Other | Source: Ambulatory Visit | Attending: Infectious Diseases | Admitting: Infectious Diseases

## 2012-08-16 DIAGNOSIS — R220 Localized swelling, mass and lump, head: Secondary | ICD-10-CM | POA: Insufficient documentation

## 2012-08-16 DIAGNOSIS — L0201 Cutaneous abscess of face: Secondary | ICD-10-CM | POA: Diagnosis not present

## 2012-08-16 DIAGNOSIS — R509 Fever, unspecified: Secondary | ICD-10-CM

## 2012-08-16 DIAGNOSIS — L03211 Cellulitis of face: Secondary | ICD-10-CM | POA: Diagnosis not present

## 2012-08-17 DIAGNOSIS — E291 Testicular hypofunction: Secondary | ICD-10-CM | POA: Diagnosis not present

## 2012-08-17 DIAGNOSIS — R7989 Other specified abnormal findings of blood chemistry: Secondary | ICD-10-CM | POA: Diagnosis not present

## 2012-08-17 DIAGNOSIS — E876 Hypokalemia: Secondary | ICD-10-CM | POA: Diagnosis not present

## 2012-08-17 DIAGNOSIS — C61 Malignant neoplasm of prostate: Secondary | ICD-10-CM | POA: Diagnosis not present

## 2012-08-23 DIAGNOSIS — E291 Testicular hypofunction: Secondary | ICD-10-CM | POA: Diagnosis not present

## 2012-08-23 DIAGNOSIS — Z8546 Personal history of malignant neoplasm of prostate: Secondary | ICD-10-CM | POA: Diagnosis not present

## 2012-08-31 ENCOUNTER — Ambulatory Visit (INDEPENDENT_AMBULATORY_CARE_PROVIDER_SITE_OTHER): Payer: Medicare Other | Admitting: Infectious Diseases

## 2012-08-31 VITALS — BP 167/75 | HR 84 | Temp 98.0°F | Ht 72.0 in | Wt 182.0 lb

## 2012-08-31 DIAGNOSIS — R509 Fever, unspecified: Secondary | ICD-10-CM

## 2012-08-31 DIAGNOSIS — I10 Essential (primary) hypertension: Secondary | ICD-10-CM | POA: Diagnosis not present

## 2012-08-31 NOTE — Progress Notes (Signed)
  Subjective:    Patient ID: Erik Mullins, male    DOB: 06/06/1931, 78 y.o.   MRN: [id number]  HPI 78 yo M with a hx of solitary kidney and CRI, and repeated episodes of fever and cellulitis/facial swelling. He was seen 02-09-12 with facial swelling and erythema. He had a temp of 102 and had chills. He was treated with cipro x 10 days. He was seen 06-06-12 with a R posterior ear knot. He was given ceftriaxone, cipro x 10 days. He had another episode 06-27-12 of L jaw pain and facial swelling. He was given ceftriaxone, bactrim. He returned 08-01-12 with temp (to 104?), fever and erythema of his face. Was given a shot (?), and then started on bactrim. Has not had any further fevers.  He has been noted to have multiple dental abscesses.  Had wisdom tooth extracted in [place].  He underwent maxilofacial CT 08-16-12 and which was (-).  Has ben feeling well. Has 1 day of augmentin left.  No further fevers, chills. No further jaw swelling or pain.    Review of Systems     Objective:   Physical Exam  Constitutional: He appears well-developed and well-nourished.  HENT:  Mouth/Throat: No oropharyngeal exudate.            Assessment & Plan:

## 2012-08-31 NOTE — Assessment & Plan Note (Signed)
Is having his BP medications changed by his PCP due to previously elevated K+. He is asx (denies HA, CP). Has f/u appt 09-09-12.

## 2012-08-31 NOTE — Assessment & Plan Note (Signed)
Suspect this is due to his teeth. I am hopeful that his course of anbx has improved this, however he still has teeth which may benefit from extraction. I made it clear to them that I am available if he has further fevers after he comes off anbx. He will otherwise f/u with his dentist and PCP.

## 2012-09-08 DIAGNOSIS — E785 Hyperlipidemia, unspecified: Secondary | ICD-10-CM | POA: Diagnosis not present

## 2012-09-08 DIAGNOSIS — E875 Hyperkalemia: Secondary | ICD-10-CM | POA: Diagnosis not present

## 2012-09-08 DIAGNOSIS — I10 Essential (primary) hypertension: Secondary | ICD-10-CM | POA: Diagnosis not present

## 2012-09-08 DIAGNOSIS — M109 Gout, unspecified: Secondary | ICD-10-CM | POA: Diagnosis not present

## 2012-09-09 DIAGNOSIS — H2510 Age-related nuclear cataract, unspecified eye: Secondary | ICD-10-CM | POA: Diagnosis not present

## 2012-09-09 DIAGNOSIS — G44209 Tension-type headache, unspecified, not intractable: Secondary | ICD-10-CM | POA: Diagnosis not present

## 2012-09-14 HISTORY — PX: CATARACT EXTRACTION W/ INTRAOCULAR LENS  IMPLANT, BILATERAL: SHX1307

## 2012-09-30 DIAGNOSIS — I10 Essential (primary) hypertension: Secondary | ICD-10-CM | POA: Diagnosis not present

## 2012-10-07 DIAGNOSIS — R7989 Other specified abnormal findings of blood chemistry: Secondary | ICD-10-CM | POA: Diagnosis not present

## 2012-11-08 DIAGNOSIS — B356 Tinea cruris: Secondary | ICD-10-CM | POA: Diagnosis not present

## 2012-11-30 DIAGNOSIS — H2510 Age-related nuclear cataract, unspecified eye: Secondary | ICD-10-CM | POA: Diagnosis not present

## 2012-12-08 ENCOUNTER — Telehealth: Payer: Self-pay | Admitting: Family Medicine

## 2012-12-08 ENCOUNTER — Ambulatory Visit (INDEPENDENT_AMBULATORY_CARE_PROVIDER_SITE_OTHER): Payer: Medicare Other | Admitting: Family Medicine

## 2012-12-08 ENCOUNTER — Encounter: Payer: Self-pay | Admitting: Family Medicine

## 2012-12-08 VITALS — BP 110/71 | HR 79 | Temp 97.4°F | Ht 71.0 in | Wt 189.6 lb

## 2012-12-08 DIAGNOSIS — N189 Chronic kidney disease, unspecified: Secondary | ICD-10-CM | POA: Diagnosis not present

## 2012-12-08 DIAGNOSIS — E785 Hyperlipidemia, unspecified: Secondary | ICD-10-CM

## 2012-12-08 DIAGNOSIS — B356 Tinea cruris: Secondary | ICD-10-CM | POA: Diagnosis not present

## 2012-12-08 DIAGNOSIS — I10 Essential (primary) hypertension: Secondary | ICD-10-CM

## 2012-12-08 MED ORDER — DOXYCYCLINE HYCLATE 100 MG PO TABS: 100.0000 mg | ORAL_TABLET | Freq: Two times a day (BID) | ORAL | Status: DC

## 2012-12-08 MED ORDER — LISINOPRIL 10 MG PO TABS: 10.0000 mg | ORAL_TABLET | Freq: Every day | ORAL | Status: DC

## 2012-12-08 MED ORDER — NYSTATIN-TRIAMCINOLONE 100000-0.1 UNIT/GM-% EX OINT: TOPICAL_OINTMENT | CUTANEOUS | Status: DC

## 2012-12-08 NOTE — Telephone Encounter (Signed)
What percentage of zinc should they use for compound rx? 40 or 11.3

## 2012-12-08 NOTE — Progress Notes (Signed)
Subjective:     Patient ID: Erik Mullins, male   DOB: February 28, 1931, 79 y.o.   MRN: [id number]  HPI Rash almost gone per patient. Wife has not checked. Less itching. No fever. Notes no chills. His other medical problems are stable. He continues with his medications. He has an appointment for lab work orders need to be placed. Past Medical History  Diagnosis Date  . Essential hypertension, benign   . History of prostate cancer   . Hyperlipidemia   . Anemia   . Chronic kidney disease   . History of gout   . 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 AAA (abdominal aortic aneurysm)     Remote   Past Surgical History  Procedure Laterality Date  . Abdominal aortic aneurysm repair      Approximately 30 yrs ago in [place]  . Partial colectomy    . Cholecystectomy     History   Social History  . Marital Status: Married    Spouse Name: N/A    Number of Children: N/A  . Years of Education: N/A   Occupational History  . Not on file.   Social History Main Topics  . Smoking status: Current Every Day Smoker -- 0.30 packs/day for 55 years    Types: Cigarettes  . Smokeless tobacco: Current User    Types: Snuff, Chew     Comment: smokes 2 cigartettes per day now  . Alcohol Use: Yes     Comment: rarely  . Drug Use: No  . Sexually Active: Not on file   Other Topics Concern  . Not on file   Social History Narrative   Relocated to this area from [place].   Family History  Problem Relation Age of Onset  . Cancer Mother     uterine deceased age 31  . Arrhythmia Other     Atrial fibrillation   Current Outpatient Prescriptions on File Prior to Visit  Medication Sig Dispense Refill  . aspirin (ASPIR-LOW) 81 MG EC tablet Take 81 mg by mouth daily.        . Calcium Carbonate-Vitamin D (CALTRATE 600+D) 600-400 MG-UNIT per tablet Take 1 tablet by mouth daily.        Marland Kitchen DAILY MULTIPLE VITAMINS PO Take by mouth. 1 tablet daily       . metoprolol tartrate  (LOPRESSOR) 25 MG tablet Take 50 mg by mouth daily.       . simvastatin (ZOCOR) 20 MG tablet Take 20 mg by mouth every evening.      Marland Kitchen amoxicillin-clavulanate (AUGMENTIN) 875-125 MG per tablet       . febuxostat (ULORIC) 40 MG tablet Take 80 mg by mouth daily with breakfast.       No current facility-administered medications on file prior to visit.   Allergies  Allergen Reactions  . Morphine     There is no immunization history on file for this patient. Prior to Admission medications   Medication Sig Start Date End Date Taking? Authorizing Provider  allopurinol (ZYLOPRIM) 100 MG tablet  12/05/12  Yes Historical Provider, MD  aspirin (ASPIR-LOW) 81 MG EC tablet Take 81 mg by mouth daily.     Yes Historical Provider, MD  Calcium Carbonate-Vitamin D (CALTRATE 600+D) 600-400 MG-UNIT per tablet Take 1 tablet by mouth daily.     Yes Historical Provider, MD  DAILY MULTIPLE VITAMINS PO Take by mouth. 1 tablet daily    Yes Historical Provider, MD  ketoconazole (NIZORAL) 2 % cream  11/08/12  Yes Historical Provider, MD  lisinopril (PRINIVIL,ZESTRIL) 10 MG tablet Take 1 tablet (10 mg total) by mouth daily. 12/08/12  Yes Ileana Ladd, MD  metoprolol tartrate (LOPRESSOR) 25 MG tablet Take 50 mg by mouth daily.    Yes Historical Provider, MD  simvastatin (ZOCOR) 20 MG tablet Take 20 mg by mouth every evening.   Yes Historical Provider, MD  amoxicillin-clavulanate (AUGMENTIN) 875-125 MG per tablet  08/05/12   Historical Provider, MD  cephALEXin (KEFLEX) 500 MG capsule  11/08/12   Historical Provider, MD  doxycycline (VIBRA-TABS) 100 MG tablet Take 1 tablet (100 mg total) by mouth 2 (two) times daily. 12/08/12   Ileana Ladd, MD  febuxostat (ULORIC) 40 MG tablet Take 80 mg by mouth daily with breakfast.    Historical Provider, MD  nystatin-triamcinolone ointment (MYCOLOG) Apply twice a day to the rash. 12/08/12   Ileana Ladd, MD     Review of Systems  Constitutional: Negative.   HENT: Negative.    Eyes: Negative.   Respiratory: Negative.   Cardiovascular: Negative.   Gastrointestinal: Negative.   Endocrine: Negative.   Genitourinary: Negative.   Musculoskeletal: Negative.   Skin: Positive for rash.  Allergic/Immunologic: Negative.   Neurological: Negative.   Hematological: Negative.   Psychiatric/Behavioral: Negative.        Objective:   Physical Exam BP 110/71  Pulse 79  Temp(Src) 97.4 F (36.3 C) (Oral)  Ht 5\' 11"  (1.803 m)  Wt 189 lb 9.6 oz (86.002 kg)  BMI 26.46 kg/m2 On examination he appeared in no acute distress. Overweight Vital signs as documented.  Skin warm and dry. There is are red rash still present in the groin area and perineum. Less extensive since his last visit. Still has some satellite lesions. The proximal thigh rash has resolved. There is no open sores or weeping lesions. Overall 50% better but not resolved.  Head &Neck without JVD. Normal. Lungs clear.  Heart exam notable for regular rhythm, normal sounds and absence of murmurs, rubs or gallops.  Abdomen unremarkable and without evidence of organomegaly, masses, or abdominal aortic enlargement.  Extremities nonedematous. Neurologic: oriented to name, place, and time. Nonfocal exam.    Assessment:     HYPERLIPIDEMIA - Plan: CANCELED: Hepatic function panel, CANCELED: NMR Lipoprofile with Lipids  Essential hypertension, benign - Plan: CANCELED: BASIC METABOLIC PANEL WITH GFR  CHRONIC KIDNEY DISEASE UNSPECIFIED - Plan: CANCELED: BASIC METABOLIC PANEL WITH GFR  Tinea cruris - Plan: nystatin-triamcinolone ointment (MYCOLOG)  No new symptoms associated with his regular medical problems     Plan:     Continue present level of care with his meds chronic medical problems. Opted to change their prescription regimen for his rash. Prescribed doxycycline 100 mg twice a day for 10 days in case this is really erythrasma. However, the satellite lesions are reflective of candidiasis. Skin care. Also  prescribed the Goo, compounded by the pharmacy. The routine is seeing him for followup in 3 months. Will order his labs in the next month.  Yeiden Frenkel P. Modesto Charon, M.D.

## 2012-12-08 NOTE — Telephone Encounter (Signed)
[facility] notified and per Dr Modesto Charon use zinc 11.3 %

## 2012-12-13 ENCOUNTER — Ambulatory Visit (INDEPENDENT_AMBULATORY_CARE_PROVIDER_SITE_OTHER): Payer: Medicare Other | Admitting: Cardiology

## 2012-12-13 ENCOUNTER — Encounter: Payer: Self-pay | Admitting: Cardiology

## 2012-12-13 VITALS — BP 105/69 | HR 78 | Ht 71.0 in | Wt 186.0 lb

## 2012-12-13 DIAGNOSIS — I471 Supraventricular tachycardia: Secondary | ICD-10-CM

## 2012-12-13 NOTE — Progress Notes (Signed)
   Clinical Summary Erik Mullins is a 79 y.o.male last seen in March 2013. He is here with his wife today. He reports no significant problems with palpitations, no sudden dizziness or syncope. He continues to play golf regularly. Has had some problems with decreased vision, and states that he is to have bilateral cataract surgery soon.  ECG today shows normal sinus rhythm. He continues on beta blocker.   Allergies  Allergen Reactions  . Morphine Other (See Comments)    hallucinate    Current Outpatient Prescriptions  Medication Sig Dispense Refill  . allopurinol (ZYLOPRIM) 100 MG tablet Take 100 mg by mouth daily.       Marland Kitchen aspirin (ASPIR-LOW) 81 MG EC tablet Take 81 mg by mouth daily.        . Calcium Carbonate-Vitamin D (CALTRATE 600+D) 600-400 MG-UNIT per tablet Take 1 tablet by mouth daily.        Marland Kitchen DAILY MULTIPLE VITAMINS PO Take by mouth. 1 tablet daily       . doxycycline (VIBRA-TABS) 100 MG tablet Take 100 mg by mouth 2 (two) times daily.      Marland Kitchen ketoconazole (NIZORAL) 2 % cream Apply 1 application topically 2 (two) times daily.       Marland Kitchen lisinopril (PRINIVIL,ZESTRIL) 10 MG tablet Take 1 tablet (10 mg total) by mouth daily.  90 tablet  2  . metoprolol tartrate (LOPRESSOR) 25 MG tablet Take 25 mg by mouth at bedtime.       . simvastatin (ZOCOR) 20 MG tablet Take 20 mg by mouth every evening.      . zinc oxide (BALMEX) 11.3 % CREA cream Apply 1 application topically 2 (two) times daily.       No current facility-administered medications for this visit.    Past Medical History  Diagnosis Date  . Essential hypertension, benign   . History of prostate cancer   . Hyperlipidemia   . Anemia   . Chronic kidney disease   . History of gout   . 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 AAA (abdominal aortic aneurysm)     Remote    Social History Erik Mullins reports that he has been smoking Cigarettes.  He has a 16.5 pack-year smoking history. His smokeless tobacco use  includes Snuff and Chew. Erik Mullins reports that  drinks alcohol.  Review of Systems Hard of hearing as before. Uses hearing aids. Otherwise negative except as outlined.  Physical Examination Filed Vitals:   12/13/12 0810  BP: 105/69  Pulse: 78   Filed Weights   12/13/12 0810  Weight: 186 lb (84.369 kg)    No acute distress.  HEENT: Conjunctiva and lids normal, oropharynx clear.  Neck: Supple, no elevated JVP or loud carotid bruits.  Lungs: Clear to auscultation, nonlabored.  Cardiac: Regular rate and rhythm, no S3.  Abdomen: Soft, nontender, bowel sounds present.  Skin: Scattered tattoos, no ulcerations.  Extremities: No pitting edema, distal pulses 1-2+.    Problem List and Plan   PSVT Quiescent on beta blocker. ECG reviewed, normal sinus rhythm. Continue observation, one year followup unless progressive symptoms.    Jonelle Sidle, M.D., F.A.C.C.

## 2012-12-13 NOTE — Assessment & Plan Note (Signed)
Quiescent on beta blocker. ECG reviewed, normal sinus rhythm. Continue observation, one year followup unless progressive symptoms.

## 2012-12-13 NOTE — Patient Instructions (Addendum)

## 2012-12-20 DIAGNOSIS — H269 Unspecified cataract: Secondary | ICD-10-CM | POA: Diagnosis not present

## 2012-12-20 DIAGNOSIS — H2510 Age-related nuclear cataract, unspecified eye: Secondary | ICD-10-CM | POA: Diagnosis not present

## 2012-12-20 DIAGNOSIS — Z961 Presence of intraocular lens: Secondary | ICD-10-CM | POA: Diagnosis not present

## 2012-12-20 DIAGNOSIS — H52229 Regular astigmatism, unspecified eye: Secondary | ICD-10-CM | POA: Diagnosis not present

## 2013-01-02 DIAGNOSIS — H2510 Age-related nuclear cataract, unspecified eye: Secondary | ICD-10-CM | POA: Diagnosis not present

## 2013-01-09 ENCOUNTER — Ambulatory Visit: Payer: Self-pay | Admitting: Family Medicine

## 2013-01-17 DIAGNOSIS — H2510 Age-related nuclear cataract, unspecified eye: Secondary | ICD-10-CM | POA: Diagnosis not present

## 2013-01-17 DIAGNOSIS — H26499 Other secondary cataract, unspecified eye: Secondary | ICD-10-CM | POA: Diagnosis not present

## 2013-01-19 DIAGNOSIS — H2510 Age-related nuclear cataract, unspecified eye: Secondary | ICD-10-CM | POA: Diagnosis not present

## 2013-01-19 DIAGNOSIS — H52229 Regular astigmatism, unspecified eye: Secondary | ICD-10-CM | POA: Diagnosis not present

## 2013-01-19 DIAGNOSIS — Z961 Presence of intraocular lens: Secondary | ICD-10-CM | POA: Diagnosis not present

## 2013-01-19 DIAGNOSIS — H269 Unspecified cataract: Secondary | ICD-10-CM | POA: Diagnosis not present

## 2013-01-24 ENCOUNTER — Other Ambulatory Visit: Payer: Self-pay | Admitting: Family Medicine

## 2013-02-02 DIAGNOSIS — G44209 Tension-type headache, unspecified, not intractable: Secondary | ICD-10-CM | POA: Diagnosis not present

## 2013-02-07 ENCOUNTER — Other Ambulatory Visit (INDEPENDENT_AMBULATORY_CARE_PROVIDER_SITE_OTHER): Payer: Medicare Other

## 2013-02-07 DIAGNOSIS — Z8546 Personal history of malignant neoplasm of prostate: Secondary | ICD-10-CM | POA: Diagnosis not present

## 2013-02-07 DIAGNOSIS — E785 Hyperlipidemia, unspecified: Secondary | ICD-10-CM

## 2013-02-07 DIAGNOSIS — I10 Essential (primary) hypertension: Secondary | ICD-10-CM | POA: Diagnosis not present

## 2013-02-07 DIAGNOSIS — N189 Chronic kidney disease, unspecified: Secondary | ICD-10-CM | POA: Diagnosis not present

## 2013-02-07 LAB — BASIC METABOLIC PANEL WITH GFR
BUN: 23 mg/dL (ref 6–23)
CO2: 27 mEq/L (ref 19–32)
Calcium: 8.9 mg/dL (ref 8.4–10.5)
Chloride: 108 mEq/L (ref 96–112)
Creat: 1.58 mg/dL — ABNORMAL HIGH (ref 0.50–1.35)
GFR, Est African American: 47 mL/min — ABNORMAL LOW
GFR, Est Non African American: 41 mL/min — ABNORMAL LOW
Glucose, Bld: 89 mg/dL (ref 70–99)
Potassium: 5.3 mEq/L (ref 3.5–5.3)
Sodium: 141 mEq/L (ref 135–145)

## 2013-02-07 LAB — HEPATIC FUNCTION PANEL
ALT: 16 U/L (ref 0–53)
AST: 21 U/L (ref 0–37)
Albumin: 4.1 g/dL (ref 3.5–5.2)
Alkaline Phosphatase: 81 U/L (ref 39–117)
Bilirubin, Direct: 0.1 mg/dL (ref 0.0–0.3)
Indirect Bilirubin: 0.3 mg/dL (ref 0.0–0.9)
Total Bilirubin: 0.4 mg/dL (ref 0.3–1.2)
Total Protein: 6.4 g/dL (ref 6.0–8.3)

## 2013-02-07 NOTE — Progress Notes (Signed)
Patient came in for labs only.

## 2013-02-07 NOTE — Addendum Note (Signed)
Addended by: Orma Render F on: 02/07/2013 09:11 AM   Modules accepted: Orders

## 2013-02-08 LAB — PSA: PSA: 0.03 ng/mL (ref <=4.00)

## 2013-02-09 LAB — NMR LIPOPROFILE WITH LIPIDS
Cholesterol, Total: 122 mg/dL (ref <200)
HDL Particle Number: 32.8 umol/L (ref >=30.5)
HDL Size: 8.5 nm — ABNORMAL LOW (ref >=9.2)
HDL-C: 41 mg/dL (ref >=40)
LDL (calc): 53 mg/dL (ref <100)
LDL Particle Number: 897 nmol/L (ref <1000)
LDL Size: 20.1 nm — ABNORMAL LOW (ref >20.5)
LP-IR Score: 69 — ABNORMAL HIGH (ref <=45)
Large HDL-P: 2 umol/L — ABNORMAL LOW (ref >=4.8)
Large VLDL-P: 1.9 nmol/L (ref <=2.7)
Small LDL Particle Number: 566 nmol/L — ABNORMAL HIGH (ref <=527)
Triglycerides: 140 mg/dL (ref <150)
VLDL Size: 49.1 nm — ABNORMAL HIGH (ref <=46.6)

## 2013-02-13 NOTE — Progress Notes (Signed)
Quick Note:  Lab result at goal. CKD is Stable. PSA not back yet. No change in Medications for now. No Change in plans and follow up. ______

## 2013-02-14 DIAGNOSIS — N138 Other obstructive and reflux uropathy: Secondary | ICD-10-CM | POA: Diagnosis not present

## 2013-02-14 DIAGNOSIS — E291 Testicular hypofunction: Secondary | ICD-10-CM | POA: Diagnosis not present

## 2013-02-14 DIAGNOSIS — Z8546 Personal history of malignant neoplasm of prostate: Secondary | ICD-10-CM | POA: Diagnosis not present

## 2013-02-16 ENCOUNTER — Encounter: Payer: Self-pay | Admitting: Family Medicine

## 2013-02-16 ENCOUNTER — Ambulatory Visit (INDEPENDENT_AMBULATORY_CARE_PROVIDER_SITE_OTHER): Payer: Medicare Other | Admitting: Family Medicine

## 2013-02-16 VITALS — BP 118/73 | HR 77 | Temp 97.8°F | Wt 179.4 lb

## 2013-02-16 DIAGNOSIS — I471 Supraventricular tachycardia, unspecified: Secondary | ICD-10-CM | POA: Diagnosis not present

## 2013-02-16 DIAGNOSIS — I714 Abdominal aortic aneurysm, without rupture, unspecified: Secondary | ICD-10-CM

## 2013-02-16 DIAGNOSIS — E785 Hyperlipidemia, unspecified: Secondary | ICD-10-CM | POA: Diagnosis not present

## 2013-02-16 DIAGNOSIS — I10 Essential (primary) hypertension: Secondary | ICD-10-CM

## 2013-02-16 DIAGNOSIS — F17200 Nicotine dependence, unspecified, uncomplicated: Secondary | ICD-10-CM

## 2013-02-16 DIAGNOSIS — Z23 Encounter for immunization: Secondary | ICD-10-CM | POA: Diagnosis not present

## 2013-02-16 DIAGNOSIS — Z720 Tobacco use: Secondary | ICD-10-CM | POA: Insufficient documentation

## 2013-02-16 DIAGNOSIS — N189 Chronic kidney disease, unspecified: Secondary | ICD-10-CM | POA: Diagnosis not present

## 2013-02-16 NOTE — Patient Instructions (Addendum)
Tetanus, Diphtheria, Pertussis (Tdap) Vaccine What You Need to Know WHY GET VACCINATED? Tetanus, diphtheria and pertussis can be very serious diseases, even for adolescents and adults. Tdap vaccine can protect us from these diseases. TETANUS (Lockjaw) causes painful muscle tightening and stiffness, usually all over the body.  It can lead to tightening of muscles in the head and neck so you can't open your mouth, swallow, or sometimes even breathe. Tetanus kills about 1 out of 5 people who are infected. DIPHTHERIA can cause a thick coating to form in the back of the throat.  It can lead to breathing problems, paralysis, heart failure, and death. PERTUSSIS (Whooping Cough) causes severe coughing spells, which can cause difficulty breathing, vomiting and disturbed sleep.  It can also lead to weight loss, incontinence, and rib fractures. Up to 2 in 100 adolescents and 5 in 100 adults with pertussis are hospitalized or have complications, which could include pneumonia and death. These diseases are caused by bacteria. Diphtheria and pertussis are spread from person to person through coughing or sneezing. Tetanus enters the body through cuts, scratches, or wounds. Before vaccines, the [place] saw as many as 200,000 cases a year of diphtheria and pertussis, and hundreds of cases of tetanus. Since vaccination began, tetanus and diphtheria have dropped by about 99% and pertussis by about 80%. TDAP VACCINE Tdap vaccine can protect adolescents and adults from tetanus, diphtheria, and pertussis. One dose of Tdap is routinely given at age 11 or 12. People who did not get Tdap at that age should get it as soon as possible. Tdap is especially important for health care professionals and anyone having close contact with a baby younger than 12 months. Pregnant women should get a dose of Tdap during every pregnancy, to protect the newborn from pertussis. Infants are most at risk for severe, life-threatening  complications from pertussis. A similar vaccine, called Td, protects from tetanus and diphtheria, but not pertussis. A Td booster should be given every 10 years. Tdap may be given as one of these boosters if you have not already gotten a dose. Tdap may also be given after a severe cut or burn to prevent tetanus infection. Your doctor can give you more information. Tdap may safely be given at the same time as other vaccines. SOME PEOPLE SHOULD NOT GET THIS VACCINE  If you ever had a life-threatening allergic reaction after a dose of any tetanus, diphtheria, or pertussis containing vaccine, OR if you have a severe allergy to any part of this vaccine, you should not get Tdap. Tell your doctor if you have any severe allergies.  If you had a coma, or long or multiple seizures within 7 days after a childhood dose of DTP or DTaP, you should not get Tdap, unless a cause other than the vaccine was found. You can still get Td.  Talk to your doctor if you:  have epilepsy or another nervous system problem,  had severe pain or swelling after any vaccine containing diphtheria, tetanus or pertussis,  ever had Guillain-Barr Syndrome (GBS),  aren't feeling well on the day the shot is scheduled. RISKS OF A VACCINE REACTION With any medicine, including vaccines, there is a chance of side effects. These are usually mild and go away on their own, but serious reactions are also possible. Brief fainting spells can follow a vaccination, leading to injuries from falling. Sitting or lying down for about 15 minutes can help prevent these. Tell your doctor if you feel dizzy or light-headed, or   have vision changes or ringing in the ears. Mild problems following Tdap (Did not interfere with activities)  Pain where the shot was given (about 3 in 4 adolescents or 2 in 3 adults)  Redness or swelling where the shot was given (about 1 person in 5)  Mild fever of at least 100.63F (up to about 1 in 25 adolescents or 1 in  100 adults)  Headache (about 3 or 4 people in 10)  Tiredness (about 1 person in 3 or 4)  Nausea, vomiting, diarrhea, stomach ache (up to 1 in 4 adolescents or 1 in 10 adults)  Chills, body aches, sore joints, rash, swollen glands (uncommon) Moderate problems following Tdap (Interfered with activities, but did not require medical attention)  Pain where the shot was given (about 1 in 5 adolescents or 1 in 100 adults)  Redness or swelling where the shot was given (up to about 1 in 16 adolescents or 1 in 25 adults)  Fever over 102F (about 1 in 100 adolescents or 1 in 250 adults)  Headache (about 3 in 20 adolescents or 1 in 10 adults)  Nausea, vomiting, diarrhea, stomach ache (up to 1 or 3 people in 100)  Swelling of the entire arm where the shot was given (up to about 3 in 100). Severe problems following Tdap (Unable to perform usual activities, required medical attention)  Swelling, severe pain, bleeding and redness in the arm where the shot was given (rare). A severe allergic reaction could occur after any vaccine (estimated less than 1 in a million doses). WHAT IF THERE IS A SERIOUS REACTION? What should I look for?  Look for anything that concerns you, such as signs of a severe allergic reaction, very high fever, or behavior changes. Signs of a severe allergic reaction can include hives, swelling of the face and throat, difficulty breathing, a fast heartbeat, dizziness, and weakness. These would start a few minutes to a few hours after the vaccination. What should I do?  If you think it is a severe allergic reaction or other emergency that can't wait, call 9-1-1 or get the person to the nearest hospital. Otherwise, call your doctor.  Afterward, the reaction should be reported to the "Vaccine Adverse Event Reporting System" (VAERS). Your doctor might file this report, or you can do it yourself through the VAERS web site at www.vaers.LAgents.no, or by calling 1-(501) 550-5206. VAERS is  only for reporting reactions. They do not give medical advice.  THE NATIONAL VACCINE INJURY COMPENSATION PROGRAM The National Vaccine Injury Compensation Program (VICP) is a federal program that was created to compensate people who may have been injured by certain vaccines. Persons who believe they may have been injured by a vaccine can learn about the program and about filing a claim by calling 1-(212)100-6291 or visiting the VICP website at SpiritualWord.at. HOW CAN I LEARN MORE?  Ask your doctor.  Call your local or state health department.  Contact the Centers for Disease Control and Prevention (CDC):  Call 939-632-2069 or visit CDC's website at PicCapture.uy. CDC Tdap Vaccine VIS (01/21/12) Document Released: 03/01/2012 Document Revised: 05/25/2012 Document Reviewed: 03/01/2012 [employer] Patient Information 2014 [place], [place].   Smoking Cessation Quitting smoking is important to your health and has many advantages. However, it is not always easy to quit since nicotine is a very addictive drug. Often times, people try 3 times or more before being able to quit. This document explains the best ways for you to prepare to quit smoking. Quitting takes hard work and  a lot of effort, but you can do it. ADVANTAGES OF QUITTING SMOKING  You will live longer, feel better, and live better.  Your body will feel the impact of quitting smoking almost immediately.  Within 20 minutes, blood pressure decreases. Your pulse returns to its normal level.  After 8 hours, carbon monoxide levels in the blood return to normal. Your oxygen level increases.  After 24 hours, the chance of having a heart attack starts to decrease. Your breath, hair, and body stop smelling like smoke.  After 48 hours, damaged nerve endings begin to recover. Your sense of taste and smell improve.  After 72 hours, the body is virtually free of nicotine. Your bronchial tubes relax and breathing becomes  easier.  After 2 to 12 weeks, lungs can hold more air. Exercise becomes easier and circulation improves.  The risk of having a heart attack, stroke, cancer, or lung disease is greatly reduced.  After 1 year, the risk of coronary heart disease is cut in half.  After 5 years, the risk of stroke falls to the same as a nonsmoker.  After 10 years, the risk of lung cancer is cut in half and the risk of other cancers decreases significantly.  After 15 years, the risk of coronary heart disease drops, usually to the level of a nonsmoker.  If you are pregnant, quitting smoking will improve your chances of having a healthy baby.  The people you live with, especially any children, will be healthier.  You will have extra money to spend on things other than cigarettes. QUESTIONS TO THINK ABOUT BEFORE ATTEMPTING TO QUIT You may want to talk about your answers with your caregiver.  Why do you want to quit?  If you tried to quit in the past, what helped and what did not?  What will be the most difficult situations for you after you quit? How will you plan to handle them?  Who can help you through the tough times? Your family? Friends? A caregiver?  What pleasures do you get from smoking? What ways can you still get pleasure if you quit? Here are some questions to ask your caregiver:  How can you help me to be successful at quitting?  What medicine do you think would be best for me and how should I take it?  What should I do if I need more help?  What is smoking withdrawal like? How can I get information on withdrawal? GET READY  Set a quit date.  Change your environment by getting rid of all cigarettes, ashtrays, matches, and lighters in your home, car, or work. Do not let people smoke in your home.  Review your past attempts to quit. Think about what worked and what did not. GET SUPPORT AND ENCOURAGEMENT You have a better chance of being successful if you have help. You can get  support in many ways.  Tell your family, friends, and co-workers that you are going to quit and need their support. Ask them not to smoke around you.  Get individual, group, or telephone counseling and support. Programs are available at [employer] and health centers. Call your local health department for information about programs in your area.  Spiritual beliefs and practices may help some smokers quit.  Download a "quit meter" on your computer to keep track of quit statistics, such as how long you have gone without smoking, cigarettes not smoked, and money saved.  Get a self-help book about quitting smoking and staying off of tobacco.  LEARN NEW SKILLS AND BEHAVIORS  Distract yourself from urges to smoke. Talk to someone, go for a walk, or occupy your time with a task.  Change your normal routine. Take a different route to work. Drink tea instead of coffee. Eat breakfast in a different place.  Reduce your stress. Take a hot bath, exercise, or read a book.  Plan something enjoyable to do every day. Reward yourself for not smoking.  Explore interactive web-based programs that specialize in helping you quit. GET MEDICINE AND USE IT CORRECTLY Medicines can help you stop smoking and decrease the urge to smoke. Combining medicine with the above behavioral methods and support can greatly increase your chances of successfully quitting smoking.  Nicotine replacement therapy helps deliver nicotine to your body without the negative effects and risks of smoking. Nicotine replacement therapy includes nicotine gum, lozenges, inhalers, nasal sprays, and skin patches. Some may be available over-the-counter and others require a prescription.  Antidepressant medicine helps people abstain from smoking, but how this works is unknown. This medicine is available by prescription.  Nicotinic receptor partial agonist medicine simulates the effect of nicotine in your brain. This medicine is available by  prescription. Ask your caregiver for advice about which medicines to use and how to use them based on your health history. Your caregiver will tell you what side effects to look out for if you choose to be on a medicine or therapy. Carefully read the information on the package. Do not use any other product containing nicotine while using a nicotine replacement product.  RELAPSE OR DIFFICULT SITUATIONS Most relapses occur within the first 3 months after quitting. Do not be discouraged if you start smoking again. Remember, most people try several times before finally quitting. You may have symptoms of withdrawal because your body is used to nicotine. You may crave cigarettes, be irritable, feel very hungry, cough often, get headaches, or have difficulty concentrating. The withdrawal symptoms are only temporary. They are strongest when you first quit, but they will go away within 10 14 days. To reduce the chances of relapse, try to:  Avoid drinking alcohol. Drinking lowers your chances of successfully quitting.  Reduce the amount of caffeine you consume. Once you quit smoking, the amount of caffeine in your body increases and can give you symptoms, such as a rapid heartbeat, sweating, and anxiety.  Avoid smokers because they can make you want to smoke.  Do not let weight gain distract you. Many smokers will gain weight when they quit, usually less than 10 pounds. Eat a healthy diet and stay active. You can always lose the weight gained after you quit.  Find ways to improve your mood other than smoking. FOR MORE INFORMATION  www.smokefree.gov  Document Released: 08/25/2001 Document Revised: 03/01/2012 Document Reviewed: 12/10/2011 [facility] Patient Information 2014 [place], [place].

## 2013-02-16 NOTE — Progress Notes (Signed)
Patient ID: Erik Mullins, male   DOB: 07-09-1931, 79 y.o.   MRN: [id number] SUBJECTIVE: Chief Complaint  Patient presents with  . Follow-up    4 month discuss labbs       HPI: Patient is here for follow up of hyperlipidemia:  denies Headache;denies Chest Pain;denies weakness;denies Shortness of Breath and orthopnea;denies Visual changes;denies palpitations;denies cough;denies pedal edema;denies symptoms of TIA or stroke;deniesClaudication symptoms. admits to Compliance with medications; denies Problems with medications.  Feels great, no problems, got to get going to his other appointment and golf game.  Continues to smoke and has no intention to stop.   PMH/PSH: reviewed/updated in Epic  SH/FH: reviewed/updated in Epic  Allergies: reviewed/updated in Epic  Medications: reviewed/updated in Epic  Immunizations: reviewed/updated in Epic  ROS: As above in the HPI. All other systems are stable or negative.  OBJECTIVE: APPEARANCE:  Patient in no acute distress.The patient appeared well nourished and normally developed. Acyanotic. Waist: VITAL SIGNS:BP 118/73  Pulse 77  Temp(Src) 97.8 F (36.6 C) (Oral)  Wt 179 lb 6.4 oz (81.375 kg)  BMI 25.03 kg/m2 WM with central obesity  SKIN: warm and  Dry without overt rashes, tattoos and scars  HEAD and Neck: without JVD, Head and scalp: normal Eyes:No scleral icterus. Fundi normal, eye movements normal. Ears: Auricle normal, canal normal, Tympanic membranes normal, insufflation normal. Nose: normal Throat: normal Neck & thyroid: normal  CHEST & LUNGS: Chest wall: normal Lungs: Clear  CVS: Reveals the PMI to be normally located. Regular rhythm, First and Second Heart sounds are normal,  absence of murmurs, rubs or gallops. Peripheral vasculature: Radial pulses: normal Dorsal pedis pulses: normal Posterior pulses: normal  ABDOMEN:  Appearance:obesity Benign, no organomegaly, no masses, no Abdominal Aortic  enlargement. No Guarding , no rebound. No Bruits. Bowel sounds: normal  RECTAL: N/A GU: N/A  EXTREMETIES: nonedematous.  MUSCULOSKELETAL:  Spine: normal  NEUROLOGIC: oriented to time,place and person; nonfocal. Strength is normal Cranial Nerves are normal.   Results for orders placed in visit on 02/07/13  HEPATIC FUNCTION PANEL      Result Value Range   Total Bilirubin 0.4  0.3 - 1.2 mg/dL   Bilirubin, Direct 0.1  0.0 - 0.3 mg/dL   Indirect Bilirubin 0.3  0.0 - 0.9 mg/dL   Alkaline Phosphatase 81  39 - 117 U/L   AST 21  0 - 37 U/L   ALT 16  0 - 53 U/L   Total Protein 6.4  6.0 - 8.3 g/dL   Albumin 4.1  3.5 - 5.2 g/dL  BASIC METABOLIC PANEL WITH GFR      Result Value Range   Sodium 141  135 - 145 mEq/L   Potassium 5.3  3.5 - 5.3 mEq/L   Chloride 108  96 - 112 mEq/L   CO2 27  19 - 32 mEq/L   Glucose, Bld 89  70 - 99 mg/dL   BUN 23  6 - 23 mg/dL   Creat 4.09 (*) 8.11 - 1.35 mg/dL   Calcium 8.9  8.4 - 91.4 mg/dL   GFR, Est African American 47 (*)    GFR, Est Non African American 41 (*)   NMR LIPOPROFILE WITH LIPIDS      Result Value Range   LDL Particle Number 897  <1000 nmol/L   LDL (calc) 53  <100 mg/dL   HDL-C 41  >=78 mg/dL   Triglycerides 295  <621 mg/dL   Cholesterol, Total 308  <200 mg/dL   HDL Particle  Number 32.8  >=30.5 umol/L   Large HDL-P 2.0 (*) >=4.8 umol/L   Large VLDL-P 1.9  <=2.7 nmol/L   Small LDL Particle Number 566 (*) <=527 nmol/L   LDL Size 20.1 (*) >20.5 nm   HDL Size 8.5 (*) >=9.2 nm   VLDL Size 49.1 (*) <=46.6 nm   LP-IR Score 69 (*) <=45  PSA      Result Value Range   PSA 0.03  <=4.00 ng/mL    ASSESSMENT: Need for Tdap vaccination - Plan: Tdap vaccine greater than or equal to 7yo IM  HYPERLIPIDEMIA  Essential hypertension, benign  CHRONIC KIDNEY DISEASE UNSPECIFIED  PSVT  AAA  Tobacco user   Reviewed labs with patient  PLAN: Orders Placed This Encounter  Procedures  . Tdap vaccine greater than or equal to 7yo IM     Meds ordered this encounter  Medications  . DISCONTD: DUREZOL 0.05 % EMUL    Sig:    Handouts in the AVS on smoking cessation Discussed lifestyle changes. Wife agrees and was supportive but patient not willing to improve his habits. Return in about 4 months (around 06/18/2013) for Recheck medical problems.  Labs on return.  Amitai Delaughter P. Modesto Charon, M.D.

## 2013-02-16 NOTE — Progress Notes (Signed)
Tolerated Tdap without any difficulty

## 2013-04-05 ENCOUNTER — Other Ambulatory Visit: Payer: Self-pay | Admitting: Family Medicine

## 2013-04-19 ENCOUNTER — Other Ambulatory Visit: Payer: Self-pay

## 2013-06-20 ENCOUNTER — Encounter: Payer: Self-pay | Admitting: Family Medicine

## 2013-06-20 ENCOUNTER — Ambulatory Visit (INDEPENDENT_AMBULATORY_CARE_PROVIDER_SITE_OTHER): Payer: Medicare Other | Admitting: Family Medicine

## 2013-06-20 VITALS — BP 114/73 | HR 78 | Temp 97.8°F | Ht 70.5 in | Wt 173.4 lb

## 2013-06-20 DIAGNOSIS — F17200 Nicotine dependence, unspecified, uncomplicated: Secondary | ICD-10-CM | POA: Diagnosis not present

## 2013-06-20 DIAGNOSIS — I714 Abdominal aortic aneurysm, without rupture, unspecified: Secondary | ICD-10-CM

## 2013-06-20 DIAGNOSIS — I471 Supraventricular tachycardia, unspecified: Secondary | ICD-10-CM

## 2013-06-20 DIAGNOSIS — I6529 Occlusion and stenosis of unspecified carotid artery: Secondary | ICD-10-CM | POA: Diagnosis not present

## 2013-06-20 DIAGNOSIS — N189 Chronic kidney disease, unspecified: Secondary | ICD-10-CM

## 2013-06-20 DIAGNOSIS — E785 Hyperlipidemia, unspecified: Secondary | ICD-10-CM

## 2013-06-20 DIAGNOSIS — I10 Essential (primary) hypertension: Secondary | ICD-10-CM | POA: Diagnosis not present

## 2013-06-20 DIAGNOSIS — I658 Occlusion and stenosis of other precerebral arteries: Secondary | ICD-10-CM | POA: Diagnosis not present

## 2013-06-20 DIAGNOSIS — Z720 Tobacco use: Secondary | ICD-10-CM

## 2013-06-20 DIAGNOSIS — I6523 Occlusion and stenosis of bilateral carotid arteries: Secondary | ICD-10-CM

## 2013-06-20 NOTE — Progress Notes (Signed)
Patient ID: Erik Mullins, male   DOB: [birth year], 79 y.o.   MRN: [id number] SUBJECTIVE: CC: Chief Complaint  Patient presents with  . Follow-up    4 month follow up chronic problems    HPI: Had a flare up of the rash on his bottom. Used the clobetasol and the rash is resolved. Patient is here for follow up of hyperlipidemia/HTN/AAA/CKD denies Headache;denies Chest Pain;denies weakness;denies Shortness of Breath and orthopnea;denies Visual changes;denies palpitations;denies cough;denies pedal edema;denies symptoms of TIA or stroke;deniesClaudication symptoms. admits to Compliance with medications; denies Problems with medications.  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  He does not plan to stop smoking and he would like Dr Modesto Charon to stop discussing his  Smoking every time he come shere.  Does not want any scans. He comes to get his blood work and Rx refilled when he needs it and that is it!!!!    Past Medical History  Diagnosis Date  . Essential hypertension, benign   . History of prostate cancer   . Hyperlipidemia   . Anemia   . Chronic kidney disease   . History of gout   . 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 AAA (abdominal aortic aneurysm)     Remote   Past Surgical History  Procedure Laterality Date  . Abdominal aortic aneurysm repair      Approximately 30 yrs ago in [place]  . Partial colectomy    . Cholecystectomy     History   Social History  . Marital Status: Married    Spouse Name: N/A    Number of Children: N/A  . Years of Education: N/A   Occupational History  . Not on file.   Social History Main Topics  . Smoking status: Current Every Day Smoker -- 0.30 packs/day for 55 years    Types: Cigarettes  . Smokeless tobacco: Current User    Types: Snuff, Chew     Comment: smokes 2 cigartettes per day now  . Alcohol Use: Yes     Comment: rarely  . Drug Use: No  . Sexual  Activity: Not on file   Other Topics Concern  . Not on file   Social History Narrative   Relocated to this area from [place].   Family History  Problem Relation Age of Onset  . Cancer Mother     uterine deceased age 75  . Arrhythmia Other     Atrial fibrillation   Current Outpatient Prescriptions on File Prior to Visit  Medication Sig Dispense Refill  . allopurinol (ZYLOPRIM) 100 MG tablet Take 100 mg by mouth daily.       Marland Kitchen aspirin (ASPIR-LOW) 81 MG EC tablet Take 81 mg by mouth daily.        . Calcium Carbonate-Vitamin D (CALTRATE 600+D) 600-400 MG-UNIT per tablet Take 1 tablet by mouth daily.        Marland Kitchen DAILY MULTIPLE VITAMINS PO Take by mouth. 1 tablet daily       . ketoconazole (NIZORAL) 2 % cream Apply 1 application topically 2 (two) times daily.       Marland Kitchen lisinopril (PRINIVIL,ZESTRIL) 10 MG tablet TAKE 1 TABLET BY MOUTH EVERY DAY  30 tablet  3  . metoprolol tartrate (LOPRESSOR) 25 MG tablet Take 25 mg by mouth at bedtime.       . simvastatin (ZOCOR) 20 MG tablet Take 20 mg by mouth  every evening.      . zinc oxide (BALMEX) 11.3 % CREA cream Apply 1 application topically 2 (two) times daily.       No current facility-administered medications on file prior to visit.   Allergies  Allergen Reactions  . Morphine Other (See Comments)    hallucinate   Immunization History  Administered Date(s) Administered  . Tdap 02/16/2013   Prior to Admission medications   Medication Sig Start Date End Date Taking? Authorizing Provider  allopurinol (ZYLOPRIM) 100 MG tablet Take 100 mg by mouth daily.  12/05/12  Yes Historical Provider, MD  aspirin (ASPIR-LOW) 81 MG EC tablet Take 81 mg by mouth daily.     Yes Historical Provider, MD  Calcium Carbonate-Vitamin D (CALTRATE 600+D) 600-400 MG-UNIT per tablet Take 1 tablet by mouth daily.     Yes Historical Provider, MD  DAILY MULTIPLE VITAMINS PO Take by mouth. 1 tablet daily    Yes Historical Provider, MD  ketoconazole (NIZORAL) 2 %  cream Apply 1 application topically 2 (two) times daily.  11/08/12  Yes Historical Provider, MD  lisinopril (PRINIVIL,ZESTRIL) 10 MG tablet TAKE 1 TABLET BY MOUTH EVERY DAY 04/05/13  Yes Ileana Ladd, MD  metoprolol tartrate (LOPRESSOR) 25 MG tablet Take 25 mg by mouth at bedtime.    Yes Historical Provider, MD  simvastatin (ZOCOR) 20 MG tablet Take 20 mg by mouth every evening.   Yes Historical Provider, MD  zinc oxide (BALMEX) 11.3 % CREA cream Apply 1 application topically 2 (two) times daily.   Yes Historical Provider, MD     ROS: As above in the HPI. All other systems are stable or negative.  OBJECTIVE: APPEARANCE:  Patient in no acute distress.The patient appeared well nourished and normally developed. Acyanotic. Waist: VITAL SIGNS:BP 114/73  Pulse 78  Temp(Src) 97.8 F (36.6 C) (Oral)  Ht 5' 10.5" (1.791 m)  Wt 173 lb 6.4 oz (78.654 kg)  BMI 24.52 kg/m2 WM elderly   SKIN: warm and  Dry without overt rashes, tattoos and scars  HEAD and Neck: without JVD, Head and scalp: normal Eyes:No scleral icterus. Fundi normal, eye movements normal. Ears: Auricle normal, canal normal, Tympanic membranes normal, insufflation normal. Nose: normal Throat: normal Neck & thyroid: normal  CHEST & LUNGS: Chest wall: normal Lungs: Clear  CVS: Reveals the PMI to be normally located. Regular rhythm, First and Second Heart sounds are normal,  absence of murmurs, rubs or gallops. Peripheral vasculature: Radial pulses: normal Dorsal pedis pulses: diminished Posterior pulses: diminished Carotid artery: absent pulse on the right. Left reduced upstroke. No bruits heard today.  ABDOMEN:  Appearance: normal Benign, no organomegaly, no masses, no Abdominal Aortic enlargement. No Guarding , no rebound. No Bruits. Bowel sounds: normal  RECTAL: N/A GU: N/A  EXTREMETIES: nonedematous.   NEUROLOGIC: oriented to time,place and person; nonfocal.  ASSESSMENT: HYPERLIPIDEMIA - Plan:  CMP14+EGFR, NMR, lipoprofile  Carotid artery stenosis, bilateral  AAA  Tobacco user  Essential hypertension, benign - Plan: CMP14+EGFR  PSVT  CHRONIC KIDNEY DISEASE UNSPECIFIED   PLAN:    Orders Placed This Encounter  Procedures  . CMP14+EGFR  . NMR, lipoprofile   Patient declines any scans or follow up of his vascular diseases. He actually got enraged when I have repeatedly brought up th eissue of smoking cessation or smoking relationship to his cardiovascular disorders.  Continue medications.  Keep active. Discussed with the wife.  Return in about 4 months (around 10/21/2013) for Recheck medical problems.  Lisbet Busker P. Modesto Charon,  M.D.

## 2013-06-22 ENCOUNTER — Other Ambulatory Visit: Payer: Self-pay | Admitting: Family Medicine

## 2013-06-22 DIAGNOSIS — E875 Hyperkalemia: Secondary | ICD-10-CM

## 2013-06-22 LAB — CMP14+EGFR
ALT: 13 IU/L (ref 0–44)
AST: 19 IU/L (ref 0–40)
Albumin/Globulin Ratio: 1.7 (ref 1.1–2.5)
Albumin: 4.3 g/dL (ref 3.5–4.8)
Alkaline Phosphatase: 99 IU/L (ref 39–117)
BUN/Creatinine Ratio: 18 (ref 10–22)
BUN: 27 mg/dL (ref 8–27)
CO2: 28 mmol/L (ref 18–29)
Calcium: 9.6 mg/dL (ref 8.6–10.2)
Chloride: 102 mmol/L (ref 97–108)
Creatinine, Ser: 1.51 mg/dL — ABNORMAL HIGH (ref 0.76–1.27)
GFR calc Af Amer: 50 mL/min/{1.73_m2} — ABNORMAL LOW (ref >59)
GFR calc non Af Amer: 43 mL/min/{1.73_m2} — ABNORMAL LOW (ref >59)
Globulin, Total: 2.6 g/dL (ref 1.5–4.5)
Glucose: 98 mg/dL (ref 65–99)
Potassium: 5.9 mmol/L — ABNORMAL HIGH (ref 3.5–5.2)
Sodium: 143 mmol/L (ref 134–144)
Total Bilirubin: 0.3 mg/dL (ref 0.0–1.2)
Total Protein: 6.9 g/dL (ref 6.0–8.5)

## 2013-06-22 LAB — NMR, LIPOPROFILE
Cholesterol: 133 mg/dL (ref <200)
HDL Cholesterol by NMR: 49 mg/dL (ref >=40)
HDL Particle Number: 32.8 umol/L (ref >=30.5)
LDL Particle Number: 1121 nmol/L — ABNORMAL HIGH (ref <1000)
LDL Size: 20.4 nm — ABNORMAL LOW (ref >20.5)
LDLC SERPL CALC-MCNC: 61 mg/dL (ref <100)
LP-IR Score: 49 — ABNORMAL HIGH (ref <=45)
Small LDL Particle Number: 770 nmol/L — ABNORMAL HIGH (ref <=527)
Triglycerides by NMR: 114 mg/dL (ref <150)

## 2013-06-22 NOTE — Progress Notes (Signed)
Quick Note:  Labs abnormal. Potassium was abnormal. The rest was stable. Need to recheck the potassium. Ordered in EPIC. ______

## 2013-06-27 ENCOUNTER — Other Ambulatory Visit (INDEPENDENT_AMBULATORY_CARE_PROVIDER_SITE_OTHER): Payer: Medicare Other

## 2013-06-27 DIAGNOSIS — E875 Hyperkalemia: Secondary | ICD-10-CM

## 2013-06-28 ENCOUNTER — Other Ambulatory Visit: Payer: Self-pay | Admitting: Family Medicine

## 2013-06-28 DIAGNOSIS — E875 Hyperkalemia: Secondary | ICD-10-CM

## 2013-06-28 LAB — BMP8+EGFR
BUN/Creatinine Ratio: 18 (ref 10–22)
BUN: 27 mg/dL (ref 8–27)
CO2: 28 mmol/L (ref 18–29)
Calcium: 9.4 mg/dL (ref 8.6–10.2)
Chloride: 103 mmol/L (ref 97–108)
Creatinine, Ser: 1.46 mg/dL — ABNORMAL HIGH (ref 0.76–1.27)
GFR calc Af Amer: 52 mL/min/{1.73_m2} — ABNORMAL LOW (ref >59)
GFR calc non Af Amer: 45 mL/min/{1.73_m2} — ABNORMAL LOW (ref >59)
Glucose: 130 mg/dL — ABNORMAL HIGH (ref 65–99)
Potassium: 5.5 mmol/L — ABNORMAL HIGH (ref 3.5–5.2)
Sodium: 145 mmol/L — ABNORMAL HIGH (ref 134–144)

## 2013-06-28 NOTE — Progress Notes (Signed)
Quick Note:  Call Patient Labs abnormal:potassium a little high still, CKD is Stable.  Recommendations: Stop his multivitamin, it may have potassium in it and with the lisinopril and CKD it can make his potassium high. Need to repeat a BMP in 2 weeks. If the potassium is still high then we may have to change the lisinopril to something else.   ______

## 2013-07-20 ENCOUNTER — Other Ambulatory Visit: Payer: Self-pay

## 2013-08-03 ENCOUNTER — Other Ambulatory Visit: Payer: Self-pay | Admitting: Family Medicine

## 2013-08-08 ENCOUNTER — Other Ambulatory Visit: Payer: Medicare Other

## 2013-08-09 ENCOUNTER — Other Ambulatory Visit (INDEPENDENT_AMBULATORY_CARE_PROVIDER_SITE_OTHER): Payer: Medicare Other

## 2013-08-09 DIAGNOSIS — E875 Hyperkalemia: Secondary | ICD-10-CM | POA: Diagnosis not present

## 2013-08-09 DIAGNOSIS — Z125 Encounter for screening for malignant neoplasm of prostate: Secondary | ICD-10-CM | POA: Diagnosis not present

## 2013-08-10 LAB — BASIC METABOLIC PANEL
BUN/Creatinine Ratio: 19 (ref 10–22)
BUN: 28 mg/dL — ABNORMAL HIGH (ref 8–27)
CO2: 28 mmol/L (ref 18–29)
Calcium: 9.6 mg/dL (ref 8.6–10.2)
Chloride: 102 mmol/L (ref 97–108)
Creatinine, Ser: 1.46 mg/dL — ABNORMAL HIGH (ref 0.76–1.27)
GFR calc Af Amer: 52 mL/min/{1.73_m2} — ABNORMAL LOW (ref >59)
GFR calc non Af Amer: 45 mL/min/{1.73_m2} — ABNORMAL LOW (ref >59)
Glucose: 85 mg/dL (ref 65–99)
Potassium: 4.8 mmol/L (ref 3.5–5.2)
Sodium: 144 mmol/L (ref 134–144)

## 2013-08-11 LAB — PSA, TOTAL AND FREE
PSA, Free: 0.03 ng/mL
PSA: 0.2 ng/mL (ref 0.0–4.0)

## 2013-08-11 LAB — TESTOSTERONE,FREE AND TOTAL
Testosterone, Free: 1.1 pg/mL — ABNORMAL LOW (ref 6.6–18.1)
Testosterone: 152 ng/dL — ABNORMAL LOW (ref 348–1197)

## 2013-08-14 ENCOUNTER — Ambulatory Visit (INDEPENDENT_AMBULATORY_CARE_PROVIDER_SITE_OTHER): Payer: Medicare Other | Admitting: Family Medicine

## 2013-08-14 ENCOUNTER — Telehealth: Payer: Self-pay | Admitting: Family Medicine

## 2013-08-14 VITALS — BP 130/77 | HR 80 | Temp 101.9°F | Ht 70.5 in | Wt 175.0 lb

## 2013-08-14 DIAGNOSIS — Z8546 Personal history of malignant neoplasm of prostate: Secondary | ICD-10-CM | POA: Diagnosis not present

## 2013-08-14 DIAGNOSIS — R509 Fever, unspecified: Secondary | ICD-10-CM

## 2013-08-14 DIAGNOSIS — L03317 Cellulitis of buttock: Secondary | ICD-10-CM

## 2013-08-14 DIAGNOSIS — L0231 Cutaneous abscess of buttock: Secondary | ICD-10-CM | POA: Diagnosis not present

## 2013-08-14 MED ORDER — CEPHALEXIN 500 MG PO CAPS: 500.0000 mg | ORAL_CAPSULE | Freq: Four times a day (QID) | ORAL | Status: DC

## 2013-08-14 MED ORDER — CEFTRIAXONE SODIUM 1 G IJ SOLR
1.0000 g | Freq: Once | INTRAMUSCULAR | Status: AC
  Administered 2013-08-14: 1 g via INTRAMUSCULAR

## 2013-08-14 NOTE — Addendum Note (Signed)
Addended by: Magdalene River on: 08/14/2013 06:08 PM   Modules accepted: Orders

## 2013-08-14 NOTE — Patient Instructions (Signed)
Warm tub soaks 20 minutes 3 or 4 times today Take Tylenol for aches pains and fever Take antibiotic as directed Return to clinic in 2 days or sooner if necessary

## 2013-08-14 NOTE — Progress Notes (Signed)
Subjective:    Patient ID: Erik Mullins, male    DOB: May 04, 1931, 79 y.o.   MRN: [id number]  HPI PATIENT HERE TODAY FOR FEVER, CHILLS AND BOIL/RASH ON BOTTOM. Patient comes in today with right buttock/perianal cellulitis for 1-2 day. He has had problems with this in the past. He has a history of prostate cancer with radiation therapy in 2010. Patient comes in today with his wife.    Patient Active Problem List   Diagnosis Date Noted  . Carotid artery stenosis 06/20/2013  . Need for Tdap vaccination 02/16/2013  . Tobacco user 02/16/2013  . Fever 08/05/2012  . Dizziness - light-headed 03/10/2011  . HYPERLIPIDEMIA 01/14/2010  . ESSENTIAL HYPERTENSION, BENIGN 01/14/2010  . PSVT 01/14/2010  . AAA 01/14/2010  . CHRONIC KIDNEY DISEASE UNSPECIFIED 01/14/2010   Outpatient Encounter Prescriptions as of 08/14/2013  Medication Sig  . allopurinol (ZYLOPRIM) 100 MG tablet Take 100 mg by mouth daily.   Marland Kitchen aspirin (ASPIR-LOW) 81 MG EC tablet Take 81 mg by mouth daily.    . Calcium Carbonate-Vitamin D (CALTRATE 600+D) 600-400 MG-UNIT per tablet Take 1 tablet by mouth daily.    Marland Kitchen DAILY MULTIPLE VITAMINS PO Take by mouth. 1 tablet daily   . ketoconazole (NIZORAL) 2 % cream Apply 1 application topically 2 (two) times daily.   Marland Kitchen lisinopril (PRINIVIL,ZESTRIL) 10 MG tablet TAKE 1 TABLET BY MOUTH EVERY DAY  . metoprolol tartrate (LOPRESSOR) 25 MG tablet Take 25 mg by mouth at bedtime.   . simvastatin (ZOCOR) 20 MG tablet Take 20 mg by mouth every evening.  . zinc oxide (BALMEX) 11.3 % CREA cream Apply 1 application topically 2 (two) times daily.    Review of Systems  Constitutional: Positive for fever and chills.  HENT: Negative.   Eyes: Negative.   Respiratory: Negative.   Cardiovascular: Negative.   Gastrointestinal: Negative.   Endocrine: Negative.   Genitourinary: Negative.   Musculoskeletal: Negative.   Skin: Positive for rash.  Allergic/Immunologic: Negative.   Neurological: Negative.     Hematological: Negative.   Psychiatric/Behavioral: Negative.        Objective:   Physical Exam  Nursing note and vitals reviewed. Constitutional: He is oriented to person, place, and time. He appears well-developed and well-nourished. No distress.  HENT:  Head: Normocephalic.  Eyes: Conjunctivae are normal. Right eye exhibits no discharge. Left eye exhibits no discharge. Scleral icterus is present.  Neck: Normal range of motion.  Abdominal: Soft. Bowel sounds are normal. He exhibits no distension and no mass. There is no tenderness. There is no rebound and no guarding.  Patient has abdominal scars which reflect a cholecystectomy and an aortic aneurysm repair.  Genitourinary:  There is right buttock and perianal erythema. There is no sign of a mass or abscess. There is a fissure externally around the anus in the midline. A rectal exam there is tenderness but once again there is no mass or abscess palpable  Musculoskeletal: Normal range of motion. He exhibits no edema.  Neurological: He is alert and oriented to person, place, and time.  Skin: Skin is warm and dry. He is not diaphoretic. There is erythema (right buttock near the anus).  Psychiatric: He has a normal mood and affect. His behavior is normal. Judgment and thought content normal.   BP 130/77  Pulse 80  Temp(Src) 101.9 F (38.8 C) (Oral)  Ht 5' 10.5" (1.791 m)  Wt 175 lb (79.379 kg)  BMI 24.75 kg/m2  Assessment & Plan:  1. Fever, unspecified - CBC With differential/Platelet  2. History of prostate cancer  3. Cellulitis, gluteal - cephALEXin (KEFLEX) 500 MG capsule; Take 1 capsule (500 mg total) by mouth 4 (four) times daily.  Dispense: 40 capsule; Refill: 0  Orders Placed This Encounter  Procedures  . CBC With differential/Platelet   Meds ordered this encounter  Medications  . cephALEXin (KEFLEX) 500 MG capsule    Sig: Take 1 capsule (500 mg total) by mouth 4 (four) times daily.    Dispense:  40  capsule    Refill:  0   --- 1 g of Rocephin IM  Patient Instructions  Warm tub soaks 20 minutes 3 or 4 times today Take Tylenol for aches pains and fever Take antibiotic as directed Return to clinic in 2 days or sooner if necessary    Nyra Capes MD

## 2013-08-14 NOTE — Telephone Encounter (Signed)
appt made

## 2013-08-15 ENCOUNTER — Other Ambulatory Visit: Payer: Self-pay | Admitting: Family Medicine

## 2013-08-15 DIAGNOSIS — Z8546 Personal history of malignant neoplasm of prostate: Secondary | ICD-10-CM | POA: Diagnosis not present

## 2013-08-15 DIAGNOSIS — E291 Testicular hypofunction: Secondary | ICD-10-CM | POA: Diagnosis not present

## 2013-08-15 LAB — CBC WITH DIFFERENTIAL
Basophils Absolute: 0 10*3/uL (ref 0.0–0.2)
Basos: 0 %
Eos: 0 %
Eosinophils Absolute: 0.1 10*3/uL (ref 0.0–0.4)
HCT: 44.6 % (ref 37.5–51.0)
Hemoglobin: 15.4 g/dL (ref 12.6–17.7)
Immature Grans (Abs): 0 10*3/uL (ref 0.0–0.1)
Immature Granulocytes: 0 %
Lymphocytes Absolute: 0.9 10*3/uL (ref 0.7–3.1)
Lymphs: 5 %
MCH: 30.7 pg (ref 26.6–33.0)
MCHC: 34.5 g/dL (ref 31.5–35.7)
MCV: 89 fL (ref 79–97)
Monocytes Absolute: 0.8 10*3/uL (ref 0.1–0.9)
Monocytes: 5 %
Neutrophils Absolute: 15.1 10*3/uL — ABNORMAL HIGH (ref 1.4–7.0)
Neutrophils Relative %: 90 %
Platelets: 153 10*3/uL (ref 150–379)
RBC: 5.01 x10E6/uL (ref 4.14–5.80)
RDW: 14.1 % (ref 12.3–15.4)
WBC: 17 10*3/uL — ABNORMAL HIGH (ref 3.4–10.8)

## 2013-08-17 ENCOUNTER — Encounter: Payer: Self-pay | Admitting: Family Medicine

## 2013-08-17 ENCOUNTER — Ambulatory Visit (INDEPENDENT_AMBULATORY_CARE_PROVIDER_SITE_OTHER): Payer: Medicare Other | Admitting: Family Medicine

## 2013-08-17 VITALS — BP 128/83 | HR 82 | Temp 98.1°F | Ht 70.5 in | Wt 175.0 lb

## 2013-08-17 DIAGNOSIS — L0231 Cutaneous abscess of buttock: Secondary | ICD-10-CM | POA: Diagnosis not present

## 2013-08-17 DIAGNOSIS — L03317 Cellulitis of buttock: Secondary | ICD-10-CM

## 2013-08-17 NOTE — Patient Instructions (Addendum)
Continue to use creams and finish antibiotics.  Return in 1 week for recheck

## 2013-08-17 NOTE — Progress Notes (Signed)
   Subjective:    Patient ID: Erik Mullins, male    DOB: 06-15-1931, 79 y.o.   MRN: [id number]  HPI Patient here today for follow up of cellulitis of the right buttock and perianal region. Patient indicates that he is doing much better following the Rocephin injection and taking the cephalexin. He is now able to sit without a lot of discomfort.     Patient Active Problem List   Diagnosis Date Noted  . History of prostate cancer 08/14/2013  . Carotid artery stenosis 06/20/2013  . Need for Tdap vaccination 02/16/2013  . Tobacco user 02/16/2013  . Fever 08/05/2012  . Dizziness - light-headed 03/10/2011  . HYPERLIPIDEMIA 01/14/2010  . ESSENTIAL HYPERTENSION, BENIGN 01/14/2010  . PSVT 01/14/2010  . AAA 01/14/2010  . CHRONIC KIDNEY DISEASE UNSPECIFIED 01/14/2010   Outpatient Encounter Prescriptions as of 08/17/2013  Medication Sig  . allopurinol (ZYLOPRIM) 100 MG tablet Take 100 mg by mouth daily.   Marland Kitchen aspirin (ASPIR-LOW) 81 MG EC tablet Take 81 mg by mouth daily.    . Calcium Carbonate-Vitamin D (CALTRATE 600+D) 600-400 MG-UNIT per tablet Take 1 tablet by mouth daily.    . cephALEXin (KEFLEX) 500 MG capsule Take 1 capsule (500 mg total) by mouth 4 (four) times daily.  Marland Kitchen DAILY MULTIPLE VITAMINS PO Take by mouth. 1 tablet daily   . ketoconazole (NIZORAL) 2 % cream Apply 1 application topically 2 (two) times daily.   Marland Kitchen lisinopril (PRINIVIL,ZESTRIL) 10 MG tablet TAKE 1 TABLET BY MOUTH EVERY DAY  . metoprolol tartrate (LOPRESSOR) 25 MG tablet Take 25 mg by mouth at bedtime.   . simvastatin (ZOCOR) 20 MG tablet Take 20 mg by mouth every evening.  . zinc oxide (BALMEX) 11.3 % CREA cream Apply 1 application topically 2 (two) times daily.    Review of Systems  Constitutional: Negative.   HENT: Negative.   Eyes: Negative.   Respiratory: Negative.   Cardiovascular: Negative.   Gastrointestinal: Negative.   Endocrine: Negative.   Genitourinary: Negative.   Musculoskeletal: Negative.     Skin: Positive for wound (cellulitis- bottom).  Allergic/Immunologic: Negative.   Neurological: Negative.   Hematological: Negative.   Psychiatric/Behavioral: Negative.        Objective:   Physical Exam  Constitutional: He is oriented to person, place, and time. He appears well-developed and well-nourished. No distress.  HENT:  Head: Normocephalic.  Hearing impaired  Neck: Normal range of motion.  Musculoskeletal: Normal range of motion.  Neurological: He is alert and oriented to person, place, and time.  Skin: Skin is warm and dry. No rash noted. There is erythema (still right buttock erythema, but definitely improved from 3 days ago. There is no fluctuance and no tenderness). No pallor.  Psychiatric: He has a normal mood and affect. His behavior is normal. Judgment and thought content normal.   BP 128/83  Pulse 82  Temp(Src) 98.1 F (36.7 C) (Oral)  Ht 5' 10.5" (1.791 m)  Wt 175 lb (79.379 kg)  BMI 24.75 kg/m2        Assessment & Plan:  Cellulitis, gluteal, improving  Patient Instructions  Continue to use creams and finish antibiotics.  Return in 1 week for recheck   Nyra Capes MD

## 2013-08-21 ENCOUNTER — Other Ambulatory Visit: Payer: Self-pay | Admitting: Family Medicine

## 2013-08-22 ENCOUNTER — Inpatient Hospital Stay (HOSPITAL_COMMUNITY)
Admission: EM | Admit: 2013-08-22 | Discharge: 2013-08-24 | DRG: 191 | Disposition: A | Discharge status: Home / Self Care | Payer: Medicare Other | Attending: Internal Medicine | Admitting: Internal Medicine

## 2013-08-22 ENCOUNTER — Encounter (HOSPITAL_COMMUNITY): Payer: Self-pay | Admitting: Emergency Medicine

## 2013-08-22 ENCOUNTER — Emergency Department (HOSPITAL_COMMUNITY): Payer: Medicare Other

## 2013-08-22 DIAGNOSIS — N184 Chronic kidney disease, stage 4 (severe): Secondary | ICD-10-CM | POA: Diagnosis not present

## 2013-08-22 DIAGNOSIS — Z8546 Personal history of malignant neoplasm of prostate: Secondary | ICD-10-CM | POA: Diagnosis not present

## 2013-08-22 DIAGNOSIS — J209 Acute bronchitis, unspecified: Secondary | ICD-10-CM

## 2013-08-22 DIAGNOSIS — Z720 Tobacco use: Secondary | ICD-10-CM | POA: Diagnosis present

## 2013-08-22 DIAGNOSIS — Z8673 Personal history of transient ischemic attack (TIA), and cerebral infarction without residual deficits: Secondary | ICD-10-CM

## 2013-08-22 DIAGNOSIS — E785 Hyperlipidemia, unspecified: Secondary | ICD-10-CM | POA: Diagnosis present

## 2013-08-22 DIAGNOSIS — R059 Cough, unspecified: Secondary | ICD-10-CM | POA: Diagnosis not present

## 2013-08-22 DIAGNOSIS — R0602 Shortness of breath: Secondary | ICD-10-CM | POA: Diagnosis not present

## 2013-08-22 DIAGNOSIS — I739 Peripheral vascular disease, unspecified: Secondary | ICD-10-CM | POA: Diagnosis present

## 2013-08-22 DIAGNOSIS — N189 Chronic kidney disease, unspecified: Secondary | ICD-10-CM

## 2013-08-22 DIAGNOSIS — E119 Type 2 diabetes mellitus without complications: Secondary | ICD-10-CM | POA: Diagnosis present

## 2013-08-22 DIAGNOSIS — I4891 Unspecified atrial fibrillation: Secondary | ICD-10-CM | POA: Diagnosis not present

## 2013-08-22 DIAGNOSIS — I10 Essential (primary) hypertension: Secondary | ICD-10-CM | POA: Diagnosis present

## 2013-08-22 DIAGNOSIS — Z8249 Family history of ischemic heart disease and other diseases of the circulatory system: Secondary | ICD-10-CM | POA: Diagnosis not present

## 2013-08-22 DIAGNOSIS — J441 Chronic obstructive pulmonary disease with (acute) exacerbation: Secondary | ICD-10-CM

## 2013-08-22 DIAGNOSIS — K7460 Unspecified cirrhosis of liver: Secondary | ICD-10-CM | POA: Diagnosis present

## 2013-08-22 DIAGNOSIS — I129 Hypertensive chronic kidney disease with stage 1 through stage 4 chronic kidney disease, or unspecified chronic kidney disease: Secondary | ICD-10-CM | POA: Diagnosis present

## 2013-08-22 DIAGNOSIS — M109 Gout, unspecified: Secondary | ICD-10-CM | POA: Diagnosis present

## 2013-08-22 DIAGNOSIS — D649 Anemia, unspecified: Secondary | ICD-10-CM | POA: Diagnosis present

## 2013-08-22 DIAGNOSIS — F17200 Nicotine dependence, unspecified, uncomplicated: Secondary | ICD-10-CM | POA: Diagnosis not present

## 2013-08-22 DIAGNOSIS — I6529 Occlusion and stenosis of unspecified carotid artery: Secondary | ICD-10-CM | POA: Diagnosis present

## 2013-08-22 DIAGNOSIS — Z7982 Long term (current) use of aspirin: Secondary | ICD-10-CM | POA: Diagnosis not present

## 2013-08-22 DIAGNOSIS — N183 Chronic kidney disease, stage 3 unspecified: Secondary | ICD-10-CM | POA: Diagnosis present

## 2013-08-22 DIAGNOSIS — I499 Cardiac arrhythmia, unspecified: Secondary | ICD-10-CM | POA: Diagnosis not present

## 2013-08-22 DIAGNOSIS — R509 Fever, unspecified: Secondary | ICD-10-CM | POA: Diagnosis not present

## 2013-08-22 DIAGNOSIS — J449 Chronic obstructive pulmonary disease, unspecified: Secondary | ICD-10-CM | POA: Diagnosis not present

## 2013-08-22 DIAGNOSIS — R000 Tachycardia, unspecified: Secondary | ICD-10-CM | POA: Diagnosis not present

## 2013-08-22 DIAGNOSIS — I471 Supraventricular tachycardia: Secondary | ICD-10-CM | POA: Diagnosis not present

## 2013-08-22 HISTORY — DX: Malignant neoplasm of prostate: C61

## 2013-08-22 HISTORY — DX: Unspecified cirrhosis of liver: K74.60

## 2013-08-22 HISTORY — DX: Peripheral vascular disease, unspecified: I73.9

## 2013-08-22 HISTORY — DX: Unspecified osteoarthritis, unspecified site: M19.90

## 2013-08-22 LAB — URINE MICROSCOPIC-ADD ON

## 2013-08-22 LAB — CBC WITH DIFFERENTIAL/PLATELET
Basophils Absolute: 0 10*3/uL (ref 0.0–0.1)
Basophils Relative: 0 % (ref 0–1)
Eosinophils Absolute: 0 10*3/uL (ref 0.0–0.7)
Eosinophils Relative: 1 % (ref 0–5)
Hemoglobin: 16.4 g/dL (ref 13.0–17.0)
Lymphs Abs: 0.7 10*3/uL (ref 0.7–4.0)
MCHC: 34.5 g/dL (ref 30.0–36.0)
MCV: 92.1 fL (ref 78.0–100.0)
Neutro Abs: 5.9 10*3/uL (ref 1.7–7.7)
Neutrophils Relative %: 81 % — ABNORMAL HIGH (ref 43–77)
Platelets: 145 10*3/uL — ABNORMAL LOW (ref 150–400)
RBC: 5.16 MIL/uL (ref 4.22–5.81)
RDW: 13.2 % (ref 11.5–15.5)

## 2013-08-22 LAB — BASIC METABOLIC PANEL
Chloride: 99 mEq/L (ref 96–112)
Creatinine, Ser: 1.45 mg/dL — ABNORMAL HIGH (ref 0.50–1.35)
GFR calc Af Amer: 51 mL/min — ABNORMAL LOW (ref >90)
GFR calc non Af Amer: 44 mL/min — ABNORMAL LOW (ref >90)
Potassium: 4.8 mEq/L (ref 3.5–5.1)
Sodium: 136 mEq/L (ref 135–145)

## 2013-08-22 LAB — MAGNESIUM: Magnesium: 1.8 mg/dL (ref 1.5–2.5)

## 2013-08-22 LAB — URINALYSIS, ROUTINE W REFLEX MICROSCOPIC
Bilirubin Urine: NEGATIVE
Hgb urine dipstick: NEGATIVE
Ketones, ur: 15 mg/dL — AB
Nitrite: NEGATIVE
Urobilinogen, UA: 0.2 mg/dL (ref 0.0–1.0)
pH: 5 (ref 5.0–8.0)

## 2013-08-22 LAB — INFLUENZA PANEL BY PCR (TYPE A & B)
H1N1 flu by pcr: NOT DETECTED
Influenza A By PCR: NEGATIVE
Influenza B By PCR: NEGATIVE

## 2013-08-22 LAB — PROTIME-INR: INR: 0.98 (ref 0.00–1.49)

## 2013-08-22 LAB — TROPONIN I: Troponin I: <0.3 ng/mL (ref <0.30)

## 2013-08-22 LAB — APTT: aPTT: 32 seconds (ref 24–37)

## 2013-08-22 MED ORDER — KETOCONAZOLE 2 % EX CREA
1.0000 "application " | TOPICAL_CREAM | Freq: Two times a day (BID) | CUTANEOUS | Status: DC
  Administered 2013-08-24: 1 via TOPICAL
  Filled 2013-08-22: qty 15

## 2013-08-22 MED ORDER — SODIUM CHLORIDE 0.9 % IV SOLN
Freq: Once | INTRAVENOUS | Status: AC
  Administered 2013-08-22: 14:00:00 via INTRAVENOUS

## 2013-08-22 MED ORDER — LISINOPRIL 10 MG PO TABS
10.0000 mg | ORAL_TABLET | Freq: Every day | ORAL | Status: DC
  Administered 2013-08-22 – 2013-08-24 (×3): 10 mg via ORAL
  Filled 2013-08-22 (×3): qty 1

## 2013-08-22 MED ORDER — ONDANSETRON HCL 4 MG PO TABS: 4.0000 mg | ORAL_TABLET | Freq: Four times a day (QID) | ORAL | Status: DC | PRN

## 2013-08-22 MED ORDER — SODIUM CHLORIDE 0.9 % IV BOLUS (SEPSIS)
1000.0000 mL | Freq: Once | INTRAVENOUS | Status: AC
  Administered 2013-08-22: 1000 mL via INTRAVENOUS

## 2013-08-22 MED ORDER — ALLOPURINOL 100 MG PO TABS
100.0000 mg | ORAL_TABLET | Freq: Every day | ORAL | Status: DC
  Administered 2013-08-22 – 2013-08-24 (×3): 100 mg via ORAL
  Filled 2013-08-22 (×3): qty 1

## 2013-08-22 MED ORDER — METOPROLOL TARTRATE 25 MG PO TABS
25.0000 mg | ORAL_TABLET | Freq: Once | ORAL | Status: AC
  Administered 2013-08-22: 25 mg via ORAL
  Filled 2013-08-22: qty 1

## 2013-08-22 MED ORDER — ZINC OXIDE 11.3 % EX CREA
1.0000 "application " | TOPICAL_CREAM | Freq: Two times a day (BID) | CUTANEOUS | Status: DC
  Administered 2013-08-23 – 2013-08-24 (×2): 1 via TOPICAL
  Filled 2013-08-22 (×2): qty 56

## 2013-08-22 MED ORDER — ACETAMINOPHEN 650 MG RE SUPP: 650.0000 mg | Freq: Four times a day (QID) | RECTAL | Status: DC | PRN

## 2013-08-22 MED ORDER — ONDANSETRON HCL 4 MG/2ML IJ SOLN: 4.0000 mg | Freq: Four times a day (QID) | INTRAMUSCULAR | Status: DC | PRN

## 2013-08-22 MED ORDER — MAGNESIUM SULFATE IN D5W 10-5 MG/ML-% IV SOLN
1.0000 g | Freq: Once | INTRAVENOUS | Status: AC
  Administered 2013-08-22: 1 g via INTRAVENOUS
  Filled 2013-08-22: qty 100

## 2013-08-22 MED ORDER — MAGNESIUM SULFATE 50 % IJ SOLN: 1.0000 g | Freq: Once | INTRAMUSCULAR | Status: DC

## 2013-08-22 MED ORDER — ATORVASTATIN CALCIUM 10 MG PO TABS
10.0000 mg | ORAL_TABLET | Freq: Every day | ORAL | Status: DC
  Administered 2013-08-22 – 2013-08-23 (×2): 10 mg via ORAL
  Filled 2013-08-22 (×3): qty 1

## 2013-08-22 MED ORDER — SODIUM CHLORIDE 0.9 % IJ SOLN
3.0000 mL | Freq: Two times a day (BID) | INTRAMUSCULAR | Status: DC
  Administered 2013-08-23 – 2013-08-24 (×2): 3 mL via INTRAVENOUS

## 2013-08-22 MED ORDER — ALBUTEROL SULFATE (5 MG/ML) 0.5% IN NEBU
2.5000 mg | INHALATION_SOLUTION | Freq: Four times a day (QID) | RESPIRATORY_TRACT | Status: DC | PRN
  Administered 2013-08-22: 2.5 mg via RESPIRATORY_TRACT
  Filled 2013-08-22: qty 0.5

## 2013-08-22 MED ORDER — HEPARIN BOLUS VIA INFUSION
4000.0000 [IU] | Freq: Once | INTRAVENOUS | Status: DC
  Filled 2013-08-22: qty 4000

## 2013-08-22 MED ORDER — DILTIAZEM HCL 25 MG/5ML IV SOLN
15.0000 mg | Freq: Once | INTRAVENOUS | Status: AC
  Administered 2013-08-22: 15 mg via INTRAVENOUS
  Filled 2013-08-22: qty 5

## 2013-08-22 MED ORDER — MOMETASONE FURO-FORMOTEROL FUM 100-5 MCG/ACT IN AERO
2.0000 | INHALATION_SPRAY | Freq: Two times a day (BID) | RESPIRATORY_TRACT | Status: DC
  Administered 2013-08-22 – 2013-08-24 (×3): 2 via RESPIRATORY_TRACT
  Filled 2013-08-22: qty 8.8

## 2013-08-22 MED ORDER — RIVAROXABAN 15 MG PO TABS
15.0000 mg | ORAL_TABLET | Freq: Every day | ORAL | Status: DC
  Filled 2013-08-22: qty 1

## 2013-08-22 MED ORDER — ACETAMINOPHEN 325 MG PO TABS
650.0000 mg | ORAL_TABLET | Freq: Once | ORAL | Status: AC
  Administered 2013-08-22: 650 mg via ORAL
  Filled 2013-08-22: qty 2

## 2013-08-22 MED ORDER — ACETAMINOPHEN 325 MG PO TABS: 650.0000 mg | ORAL_TABLET | Freq: Four times a day (QID) | ORAL | Status: DC | PRN

## 2013-08-22 MED ORDER — LEVOFLOXACIN IN D5W 750 MG/150ML IV SOLN
750.0000 mg | INTRAVENOUS | Status: DC
  Administered 2013-08-22: 750 mg via INTRAVENOUS
  Filled 2013-08-22: qty 150

## 2013-08-22 MED ORDER — HEPARIN (PORCINE) IN NACL 100-0.45 UNIT/ML-% IJ SOLN
1100.0000 [IU]/h | INTRAMUSCULAR | Status: DC
  Filled 2013-08-22: qty 250

## 2013-08-22 MED ORDER — METOPROLOL TARTRATE 25 MG PO TABS
25.0000 mg | ORAL_TABLET | Freq: Every day | ORAL | Status: DC
  Administered 2013-08-22: 25 mg via ORAL
  Filled 2013-08-22 (×2): qty 1

## 2013-08-22 MED ORDER — SODIUM CHLORIDE 0.9 % IV SOLN
Freq: Once | INTRAVENOUS | Status: AC
  Administered 2013-08-22: 11:00:00 via INTRAVENOUS

## 2013-08-22 MED ORDER — SIMVASTATIN 20 MG PO TABS
20.0000 mg | ORAL_TABLET | Freq: Every evening | ORAL | Status: DC
  Filled 2013-08-22: qty 1

## 2013-08-22 MED ORDER — TIOTROPIUM BROMIDE MONOHYDRATE 18 MCG IN CAPS
18.0000 ug | ORAL_CAPSULE | Freq: Every day | RESPIRATORY_TRACT | Status: DC
  Administered 2013-08-24: 18 ug via RESPIRATORY_TRACT
  Filled 2013-08-22: qty 5

## 2013-08-22 MED ORDER — DILTIAZEM HCL ER COATED BEADS 180 MG PO CP24
180.0000 mg | ORAL_CAPSULE | Freq: Every day | ORAL | Status: DC
  Filled 2013-08-22: qty 1

## 2013-08-22 MED ORDER — DILTIAZEM HCL 100 MG IV SOLR
5.0000 mg/h | INTRAVENOUS | Status: DC
  Administered 2013-08-22 – 2013-08-23 (×2): 5 mg/h via INTRAVENOUS
  Filled 2013-08-22 (×2): qty 100

## 2013-08-22 MED ORDER — RIVAROXABAN 15 MG PO TABS
15.0000 mg | ORAL_TABLET | Freq: Every day | ORAL | Status: DC
  Administered 2013-08-22 – 2013-08-24 (×3): 15 mg via ORAL
  Filled 2013-08-22 (×4): qty 1

## 2013-08-22 NOTE — ED Notes (Signed)
NOTIFIED DR. NANAVATI OF PATIENTS LAB RESULTS OF CG4 LACTIC ACID = 209 mmoI/L, @11 :30 AM, 08/22/2013.

## 2013-08-22 NOTE — ED Notes (Addendum)
Called to cardiology to confirm that pt will hold oral cardizem and start IV drip. Erik Mullins on 2W made aware of new plan of care. Heparin dc per verbal order from [facility] PT remains in sinus rhythm, rate 80-90's. Transported to floor.

## 2013-08-22 NOTE — ED Notes (Signed)
Md Rhunette Croft made aware of pt's pressure on 102/64. Pt still appropriate to receive metoprolol.

## 2013-08-22 NOTE — Consult Note (Addendum)
CARDIOLOGY CONSULT NOTE   Patient ID: Erik Mullins MRN: [id number] DOB/AGE: 1931/01/13 79 y.o.  Admit date: 08/22/2013  Primary Physician   [name], MD Primary Cardiologist   Dr. Diona Browner  Reason for Consultation   Afib  HPI: Erik Mullins is a 79 y.o. male with a history of 70 years tobacco abuse, HTN, HLD, CVAs, carotid artery stenosis s/p R CEA, PVD, CKD, history of PVST quiescent on beta blocker, AAA s/p repair, prostate cancer and cirrhosis who presented to the ED today in Afib with RVR.  Patient complains of a cough with clear sputum and nasal drainage since Sunday with some "unsteadiness."  He took some cough and cold medications with no relief. Had a temperature of 101.3 F this AM and went to the urgent care. At the urgent care they found him to be in Afib with RVR 120-180s and called EMS.  He denies chest pain, SOB, palpitations, n/v, abdominal pain, lightheadedness, dizziness, pre syncope, syncope or edema. Patient with no symptoms currently.  He was given a cardizem bolus in the ED which broke rhythm and now he is in sinus tach HR 90-100s.  EKG with no acute ST or TW changes, troponin POC neg, no chest pain   Past Medical History  Diagnosis Date  . Essential hypertension, benign   . History of prostate cancer     in remission   . Hyperlipidemia   . Anemia   . Chronic kidney disease   . History of gout   . 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 AAA (abdominal aortic aneurysm)     Remote ~1994   . Diabetes mellitus without complication   . Hepatitis   . Cirrhosis   . Tobacco abuse   . History of CEA (carotid endarterectomy)     Right (~1999)  . PAD (peripheral artery disease)     stenting in lower extremities (no records)  . Atrial fibrillation with RVR      Past Surgical History  Procedure Laterality Date  . Abdominal aortic aneurysm repair      Approximately 30 yrs ago in [place]  . Partial colectomy    .  Cholecystectomy      Allergies  Allergen Reactions  . Morphine Other (See Comments)    hallucinate    I have reviewed the patient's current medications   Prior to Admission medications   Medication Sig Start Date End Date Taking? Authorizing Provider  allopurinol (ZYLOPRIM) 100 MG tablet Take 100 mg by mouth daily.  12/05/12   Historical Provider, MD  aspirin (ASPIR-LOW) 81 MG EC tablet Take 81 mg by mouth daily.      Historical Provider, MD  Calcium Carbonate-Vitamin D (CALTRATE 600+D) 600-400 MG-UNIT per tablet Take 1 tablet by mouth daily.      Historical Provider, MD  cephALEXin (KEFLEX) 500 MG capsule Take 1 capsule (500 mg total) by mouth 4 (four) times daily. 08/14/13   Ernestina Penna, MD  DAILY MULTIPLE VITAMINS PO Take by mouth. 1 tablet daily     Historical Provider, MD  ketoconazole (NIZORAL) 2 % cream Apply 1 application topically 2 (two) times daily.  11/08/12   Historical Provider, MD  lisinopril (PRINIVIL,ZESTRIL) 10 MG tablet TAKE 1 TABLET BY MOUTH EVERY DAY 08/03/13   Ileana Ladd, MD  metoprolol tartrate (LOPRESSOR) 25 MG tablet Take 25 mg by mouth at bedtime.     Historical Provider, MD  simvastatin (ZOCOR)  20 MG tablet Take 20 mg by mouth every evening.    Historical Provider, MD  zinc oxide (BALMEX) 11.3 % CREA cream Apply 1 application topically 2 (two) times daily.    Historical Provider, MD     History   Social History  . Marital Status: Married    Spouse Name: N/A    Number of Children: N/A  . Years of Education: N/A   Occupational History  . Not on file.   Social History Main Topics  . Smoking status: Current Every Day Smoker -- 0.30 packs/day for 70 years    Types: Cigarettes  . Smokeless tobacco: Current User    Types: Snuff, Chew  . Alcohol Use: Yes     Comment: rarely  . Drug Use: No  . Sexual Activity: Not on file   Other Topics Concern  . Not on file   Social History Narrative   From [place]. Relocated to this area from [facility].    Family Status  Relation Status Death Age  . Mother Deceased   . Father Deceased   . Sister Alive   . Daughter Alive   . Son Deceased    Family History  Problem Relation Age of Onset  . Cancer Mother     uterine deceased age 66  . Arrhythmia Other     Atrial fibrillation  . Hypertension Maternal Grandmother      ROS:  Full 14 point review of systems complete and found to be negative unless listed above.  Physical Exam: Blood pressure 122/76, pulse 89, temperature 98.4 F (36.9 C), temperature source Oral, resp. rate 20, weight 175 lb (79.379 kg), SpO2 97.00%.  General: Well developed, well nourished, male in no acute distress. He is hard of hearing and wears hearing aid Head: Eyes PERRLA, No xanthomas.   Normocephalic and atraumatic, oropharynx without edema or exudate.   Lungs: +Rhonchi and rales Heart: HRRR S1 S2, no rub/gallop, Heart regular rate and rhythm, tachycardic. pulses are normal  Neck: No lymphadenopathy. No JVD. Abdomen: Bowel sounds present, abdomen soft and non-tender without masses or hernias noted. Msk:  No spine or cva tenderness. No weakness, no joint deformities or effusions. Extremities: No clubbing or cyanosis.  No edema.  Neuro: Alert and oriented X 3. No focal deficits noted. Psych:  Good affect, responds appropriately Skin: No rashes or lesions noted.  Labs:   Lab Results  Component Value Date   WBC 7.2 08/22/2013   HGB 16.4 08/22/2013   HCT 47.5 08/22/2013   MCV 92.1 08/22/2013   PLT 145* 08/22/2013    Recent Labs  08/22/13 1055  INR 0.98     Recent Labs Lab 08/22/13 1055  NA 136  K 4.8  CL 99  CO2 24  BUN 23  CREATININE 1.45*  CALCIUM 9.0  GLUCOSE 90   Magnesium  Date Value Range Status  08/22/2013 1.8  1.5 - 2.5 mg/dL Final    Recent Labs  16/10/96 1055  TROPONINI <0.30   No results found for this basename: TROPIPOC,  in the last 72 hours No results found for this basename: probnp   Lab Results  Component  Value Date   CHOL 133 06/20/2013   LDLCALC 53 02/07/2013   TRIG 140 02/07/2013    Echo: 12/20/2009 1.Study technically limited by poor acoustic windows 2. LV chamber nl. Mild concentric left vent wall hypertrophy. Global L vent wall motion and contractility are WNL. EF: 60-65%. Abnormal left ventricular diastolic filling is observed,  consistent with impaired relaxation. 3. Trace trivial regurgitation  4. Aortic valve is not well visualized, mild aortic leaflet   ECG:  Afib RVR HR 144  Radiology:  Dg Chest Port 1 View  08/22/2013   CLINICAL DATA:  Cough  EXAM: PORTABLE CHEST - 1 VIEW  COMPARISON:  08/22/2013  FINDINGS: Cardiac shadow is stable. The lungs are hyperinflated consistent with COPD. No focal infiltrate or sizable effusion is noted. No pneumothorax is seen. Old rib fractures are again noted on the right.  IMPRESSION: No acute abnormality noted.      ASSESSMENT AND PLAN:    Active Problems:   Atrial fibrillation with RVR   Hyperlipidemia   Essential hypertension, benign   Chronic kidney disease, unspecified    Tobacco user   Carotid artery stenosis    Erik Mullins is a 79 y.o. male with a history of 70 years tobacco abuse, HTN, HLD, CVAs, carotid artery stenosis s/p R CEA, PVD, CKD, history of PVST, AAA s/p repair, prostate cancer and cirrhosis who presented to the ED today in Afib with RVR.   New onset Afib -- Given dilt bolus 15 mg in ED and now in sinus tach: HR 96 -- CHADS score 5 (HTN, DM, Age 80, previous stroke/TIA), will start Xarelto 15 in the setting of CKD (cr. 1.45) -- Add Diltiazem for rate control  -- Patient with fever and URI symptoms, could have flu. Will be admitted to medicine for further observation.   PSVT -- Followed by Dr. 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 --Cr 1.45 -- Continue lisinopril   Signed: Thereasa Parkin, PA-C 08/22/2013 2:14 PM   Co-Sign MD  Patient seen with PA, agree with the above note.  He has had a febrile illness with upper respiratory congestion and cough for several days.  He went to urgent care today for evaluation and was noted to be in atrial fibrillation with RVR.  This has not been noted in the past. On exam, he has decreased breath sounds and diffuse wheezing bilaterally.  He is back in NSR now.   1. Atrial fibrillation with RVR: Now back in NSR.  Suspect this was triggered by his acute respiratory illness.  Given wheezing, will avoid beta blocker use.  No chest pain and has had 1 set negative cardiac enzymes.  - Will start diltiazem CD 180 mg daily, 1st dose now.  - Should be anticoagulated given high CHADSVASC score.  No bleeding history.  Will start Xarelto 15 mg daily given GFR < 50.  Does not need to be on heparin gtt.  - Echocardiogram to assess LV/RV function. - Check TSH.  2. ID: Febrile with cough, wheezing, and upper respiratory congestion.  Suspect he has underlying COPD with exacerbation.  He could have influenza.  This is being assessed by the hospitalist service.  3. H/o PAD: No significant claudication reported.  He is on a statin.   Marca Ancona 08/22/2013 .2:20 PM   Patient back in atrial fibrillation with RVR.  Will start diltiazem gtt to rate control.   Marca Ancona 08/22/2013 2:21 PM

## 2013-08-22 NOTE — Progress Notes (Signed)
ANTICOAGULATION CONSULT NOTE - Initial Consult  Pharmacy Consult for heparin Indication: atrial fibrillation  Allergies  Allergen Reactions  . Morphine Other (See Comments)    hallucinate    Patient Measurements: Weight: 175 lb (79.379 kg) Heparin Dosing Weight: 79kg  Vital Signs: Temp: 99.2 F (37.3 C) (12/09 1229) Temp src: Oral (12/09 1229) BP: 102/64 mmHg (12/09 1319) Pulse Rate: 97 (12/09 1319)  Labs:  Recent Labs  08/22/13 1055  HGB 16.4  HCT 47.5  PLT 145*  APTT 32  LABPROT 12.8  INR 0.98  CREATININE 1.45*  TROPONINI <0.30    The CrCl is unknown because both a height and weight (above a minimum accepted value) are required for this calculation.   Medical History: Past Medical History  Diagnosis Date  . Essential hypertension, benign   . History of prostate cancer     in remission   . Hyperlipidemia   . Anemia   . Chronic kidney disease   . History of gout   . 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 AAA (abdominal aortic aneurysm)     Remote ~1994   . Diabetes mellitus without complication   . Hepatitis   . Cirrhosis   . Tobacco abuse   . History of CEA (carotid endarterectomy)     Right (~1999)  . PAD (peripheral artery disease)     stenting in lower extremities (no records)  . Atrial fibrillation with RVR    Assessment: 81 YOM found to be in AFib that is new onset. He is not on anticoagulants PTA. Baseline Hgb 16.4, plts slightly low at 145. Baseline aPTT and INR are WNL. No overt bleeding noted.  Goal of Therapy:  Heparin level 0.3-0.7 units/ml Monitor platelets by anticoagulation protocol: Yes   Plan:  1. Heparin bolus with 4000 units IV x1 2. Start heparin infusion at 1100 units/hr 3. Heparin level in 8 hours 4. Daily heparin level and CBC 5. Will follow for long term plans  Pascale Maves D. Adalynne Steffensmeier, PharmD, BCPS Clinical Pharmacist Pager: [phone] 08/22/2013 1:38 PM

## 2013-08-22 NOTE — ED Notes (Signed)
Cardiologist Dr. Shirlee Latch at bedside.

## 2013-08-22 NOTE — H&P (Signed)
[facility] History and Physical  Erik Mullins VWU:[id number] DOB: May 02, 1931 DOA: 08/22/2013  Referring physician:  PCP: [name], MD  Specialists:   Chief Complaint:   HPI: Erik Mullins is a 79 y.o. male with PMH of HTN, CKD, HPL,PAD, CVA, AAA s/p repair, prostate cancer, carotid artery stenosis s/p R CEA, hepatitis,  Gout, presented with productive cough, fever, SOB and found to be a fib RVR; denies chest pain, no nausea, vomiting or diarrhea -patient was seen by cards in ED, started on xarelto, IV cardizem converted to NSR spontaneously   Review of Systems: The patient denies anorexia, weight loss,, vision loss, decreased hearing, hoarseness, chest pain, syncope, dyspnea on exertion, peripheral edema, balance deficits, hemoptysis, abdominal pain, melena, hematochezia, severe indigestion/heartburn, hematuria, incontinence, genital sores, muscle weakness, suspicious skin lesions, transient blindness, difficulty walking, depression, unusual weight change, abnormal bleeding, enlarged lymph nodes, angioedema, and breast masses.    Past Medical History  Diagnosis Date  . Essential hypertension, benign   . History of prostate cancer     in remission   . Hyperlipidemia   . Anemia   . Chronic kidney disease   . History of gout   . 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 AAA (abdominal aortic aneurysm)     Remote ~1994   . Diabetes mellitus without complication   . Hepatitis   . Cirrhosis   . Tobacco abuse   . History of CEA (carotid endarterectomy)     Right (~1999)  . PAD (peripheral artery disease)     stenting in lower extremities (no records)  . Atrial fibrillation with RVR    Past Surgical History  Procedure Laterality Date  . Abdominal aortic aneurysm repair      Approximately 30 yrs ago in [place]  . Partial colectomy    . Cholecystectomy     Social History:  reports that he has been smoking Cigarettes.  He has a 21 pack-year  smoking history. His smokeless tobacco use includes Snuff and Chew. He reports that he drinks alcohol. He reports that he does not use illicit drugs. Home: where does patient live--home, ALF, SNF? and with whom if at home? Yes: Can patient participate in ADLs?  Allergies  Allergen Reactions  . Morphine Other (See Comments)    hallucinate    Family History  Problem Relation Age of Onset  . Cancer Mother     uterine deceased age 60  . Arrhythmia Other     Atrial fibrillation  . Hypertension Maternal Grandmother     (be sure to complete)  Prior to Admission medications   Medication Sig Start Date End Date Taking? Authorizing Provider  allopurinol (ZYLOPRIM) 100 MG tablet Take 100 mg by mouth daily.  12/05/12  Yes Historical Provider, MD  aspirin (ASPIR-LOW) 81 MG EC tablet Take 81 mg by mouth daily.     Yes Historical Provider, MD  Calcium Carbonate-Vitamin D (CALTRATE 600+D) 600-400 MG-UNIT per tablet Take 1 tablet by mouth daily.     Yes Historical Provider, MD  cephALEXin (KEFLEX) 500 MG capsule Take 1 capsule (500 mg total) by mouth 4 (four) times daily. 08/14/13  Yes Ernestina Penna, MD  DAILY MULTIPLE VITAMINS PO Take by mouth. 1 tablet daily    Yes Historical Provider, MD  Dextromethorphan-Guaifenesin (GUAIFENESIN DM PO) Take 2 tablets by mouth every evening.   Yes Historical Provider, MD  ketoconazole (NIZORAL) 2 % cream Apply 1 application topically 2 (two)  times daily.  11/08/12  Yes Historical Provider, MD  lisinopril (PRINIVIL,ZESTRIL) 10 MG tablet TAKE 1 TABLET BY MOUTH EVERY DAY 08/03/13  Yes Ileana Ladd, MD  metoprolol tartrate (LOPRESSOR) 25 MG tablet Take 25 mg by mouth at bedtime.    Yes Historical Provider, MD  simvastatin (ZOCOR) 20 MG tablet Take 20 mg by mouth every evening.   Yes Historical Provider, MD  zinc oxide (BALMEX) 11.3 % CREA cream Apply 1 application topically 2 (two) times daily.   Yes Historical Provider, MD   Physical Exam: Filed Vitals:   08/22/13  1346  BP:   Pulse:   Temp: 98.4 F (36.9 C)  Resp:      General:  alert  Eyes: EOM-I  ENT: no oral ulcers   Neck: supple   Cardiovascular: s1,s2 regular   Respiratory: Poor ventilation BL  Abdomen: soft, nt, nd   Skin: perianal erythema   Musculoskeletal: no LE edema  Psychiatric: no hallucinations   Neurologic: CN 2-12 intact; motor 5/5 intact   Labs on Admission:  Basic Metabolic Panel:  Recent Labs Lab 08/22/13 1055  NA 136  K 4.8  CL 99  CO2 24  GLUCOSE 90  BUN 23  CREATININE 1.45*  CALCIUM 9.0  MG 1.8   Liver Function Tests: No results found for this basename: AST, ALT, ALKPHOS, BILITOT, PROT, ALBUMIN,  in the last 168 hours No results found for this basename: LIPASE, AMYLASE,  in the last 168 hours No results found for this basename: AMMONIA,  in the last 168 hours CBC:  Recent Labs Lab 08/22/13 1055  WBC 7.2  NEUTROABS 5.9  HGB 16.4  HCT 47.5  MCV 92.1  PLT 145*   Cardiac Enzymes:  Recent Labs Lab 08/22/13 1055  TROPONINI <0.30    BNP (last 3 results) No results found for this basename: PROBNP,  in the last 8760 hours CBG: No results found for this basename: GLUCAP,  in the last 168 hours  Radiological Exams on Admission: Dg Chest Port 1 View  08/22/2013   CLINICAL DATA:  Cough  EXAM: PORTABLE CHEST - 1 VIEW  COMPARISON:  08/22/2013  FINDINGS: Cardiac shadow is stable. The lungs are hyperinflated consistent with COPD. No focal infiltrate or sizable effusion is noted. No pneumothorax is seen. Old rib fractures are again noted on the right.  IMPRESSION: No acute abnormality noted.   Electronically Signed   By: Alcide Clever M.D.   On: 08/22/2013 12:00    EKG: Independently reviewed. A fib RVR  Assessment/Plan Principal Problem:   Atrial fibrillation with RVR Active Problems:   HYPERLIPIDEMIA   Essential hypertension, benign   CHRONIC KIDNEY DISEASE UNSPECIFIED   Fever   Tobacco user   Carotid artery stenosis  79 y.o.  male with PMH of HTN, CKD, HPL, PAD, CVA, AAA s/p repair, prostate cancer, carotid artery stenosis s/p R CEA, hepatitis,  Gout, presented with productive cough, fever, SOB and found fib RVR;  1. A fib RVR; now in NSR; started on cardizem, xarelto per cards; high CHADS score; pend echo, tsh  2. COPD exacerbation; no PFTs, active smoker; CXR: no clear infiltrate; lungs poor ventilation BL -start IV atx, bronchodilators; steroids inhaled; influenza pcr; PFT outpatient; stop smoking;    3. HTN cont home regimen, added cardizem per cards    4. Fever unclear etiology at this time, possible viral illness; pend blood cultures; mild perianal cellulitis which is improving per patient  -cont IV atx, pend influenza; blood  c/s;    Cardiology;  if consultant consulted, please document name and whether formally or informally consulted  Code Status: full (must indicate code status--if unknown or must be presumed, indicate so) Family Communication: wife at  The bedside (indicate person spoken with, if applicable, with phone number if by telephone) Disposition Plan: hoem in 24-48 hours pend clinical improvement  (indicate anticipated LOS)  Time spent: >45 minutes   Esperanza Sheets [facility] Pager [phone]  If 7PM-7AM, please contact night-coverage [email or web address] Password [email or web address] 08/22/2013, 2:22 PM

## 2013-08-22 NOTE — ED Notes (Signed)
Pt now in sinus rhythm on monitor. Cardiology PA at bedside. Repeat EKG captured, shown to [place]. Pt resting comfortably in bed.

## 2013-08-22 NOTE — ED Notes (Signed)
Pt removed from 2L [place], O2 decreased to 89%. Pt denies SOB. Placed again on 2L [place], increased to 94%.

## 2013-08-22 NOTE — ED Notes (Signed)
Pt is here from urgent care where he went for productive cough for 2 weeks and found to have temp 103.2 not medicated for and then found to be in atrial fib from 140-220 that is new onset.

## 2013-08-22 NOTE — ED Notes (Signed)
Pt given 500 bolus before Cardizem given per MD orders.

## 2013-08-22 NOTE — ED Provider Notes (Signed)
CSN: [id number]     Arrival date & time 08/22/13  1023 History   First MD Initiated Contact with Patient 08/22/13 1034     Chief Complaint  Patient presents with  . Atrial Fibrillation  . Cough   (Consider location/radiation/quality/duration/timing/severity/associated sxs/prior Treatment) HPI Comments: Erik Mullins is a 79 y.o. male with PMH of HTN, CKD, HPL,PAD, CVA, AAA s/p repair, prostate cancer, carotid artery stenosis s/p R CEA, hepatitis,  Gout, who presents to the ED transferred from outside facility with cc of cough, fever, dib - and was found to be in afib with RVR. Pt has been feeling sick for the past few days, as he has a productive cough, and exertional dib but he denies chest pain, palpitations, dizziness.  Patient is a 79 y.o. male presenting with atrial fibrillation and cough. The history is provided by the patient.  Atrial Fibrillation Associated symptoms include shortness of breath. Pertinent negatives include no chest pain and no headaches.  Cough Associated symptoms: fever and shortness of breath   Associated symptoms: no chest pain, no chills, no headaches and no rash     Past Medical History  Diagnosis Date  . Essential hypertension, benign   . History of prostate cancer     in remission   . Hyperlipidemia   . Anemia   . Chronic kidney disease   . History of gout   . 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 AAA (abdominal aortic aneurysm)     Remote ~1994   . Diabetes mellitus without complication   . Hepatitis   . Cirrhosis   . Tobacco abuse   . History of CEA (carotid endarterectomy)     Right (~1999)  . PAD (peripheral artery disease)     stenting in lower extremities (no records)   Past Surgical History  Procedure Laterality Date  . Abdominal aortic aneurysm repair      Approximately 30 yrs ago in [place]  . Partial colectomy    . Cholecystectomy     Family History  Problem Relation Age of Onset  . Cancer Mother      uterine deceased age 58  . Arrhythmia Other     Atrial fibrillation  . Hypertension Maternal Grandmother    History  Substance Use Topics  . Smoking status: Current Every Day Smoker -- 0.30 packs/day for 70 years    Types: Cigarettes  . Smokeless tobacco: Current User    Types: Snuff, Chew  . Alcohol Use: Yes     Comment: rarely    Review of Systems  Constitutional: Positive for fever. Negative for chills and activity change.  Eyes: Negative for visual disturbance.  Respiratory: Positive for cough and shortness of breath. Negative for chest tightness.   Cardiovascular: Negative for chest pain.  Gastrointestinal: Negative for abdominal distention.  Genitourinary: Negative for dysuria, enuresis and difficulty urinating.  Musculoskeletal: Negative for arthralgias and neck pain.  Skin: Negative for rash.  Neurological: Negative for dizziness, light-headedness and headaches.  Psychiatric/Behavioral: Negative for confusion.    Allergies  Morphine  Home Medications   Current Outpatient Rx  Name  Route  Sig  Dispense  Refill  . allopurinol (ZYLOPRIM) 100 MG tablet   Oral   Take 100 mg by mouth daily.          Marland Kitchen aspirin (ASPIR-LOW) 81 MG EC tablet   Oral   Take 81 mg by mouth daily.           Marland Kitchen  Calcium Carbonate-Vitamin D (CALTRATE 600+D) 600-400 MG-UNIT per tablet   Oral   Take 1 tablet by mouth daily.           . cephALEXin (KEFLEX) 500 MG capsule   Oral   Take 1 capsule (500 mg total) by mouth 4 (four) times daily.   40 capsule   0   . DAILY MULTIPLE VITAMINS PO   Oral   Take by mouth. 1 tablet daily          . ketoconazole (NIZORAL) 2 % cream   Topical   Apply 1 application topically 2 (two) times daily.          Marland Kitchen lisinopril (PRINIVIL,ZESTRIL) 10 MG tablet      TAKE 1 TABLET BY MOUTH EVERY DAY   30 tablet   3   . metoprolol tartrate (LOPRESSOR) 25 MG tablet   Oral   Take 25 mg by mouth at bedtime.          . simvastatin (ZOCOR) 20 MG  tablet   Oral   Take 20 mg by mouth every evening.         . zinc oxide (BALMEX) 11.3 % CREA cream   Topical   Apply 1 application topically 2 (two) times daily.          BP 102/64  Pulse 97  Temp(Src) 99.2 F (37.3 C) (Oral)  Resp 20  Wt 175 lb (79.379 kg)  SpO2 96% Physical Exam  Nursing note and vitals reviewed. Constitutional: He is oriented to person, place, and time. He appears well-developed.  HENT:  Head: Normocephalic and atraumatic.  Eyes: Conjunctivae and EOM are normal. Pupils are equal, round, and reactive to light.  Neck: Normal range of motion. Neck supple.  Pulmonary/Chest: Effort normal and breath sounds normal. No respiratory distress. He has no wheezes. He has no rales.  Abdominal: Soft. Bowel sounds are normal. He exhibits no distension. There is no tenderness. There is no rebound and no guarding.  Neurological: He is alert and oriented to person, place, and time.  Skin: Skin is warm.    ED Course  Procedures (including critical care time) Labs Review Labs Reviewed  CBC WITH DIFFERENTIAL - Abnormal; Notable for the following:    Platelets 145 (*)    Neutrophils Relative % 81 (*)    Lymphocytes Relative 9 (*)    All other components within normal limits  BASIC METABOLIC PANEL - Abnormal; Notable for the following:    Creatinine, Ser 1.45 (*)    GFR calc non Af Amer 44 (*)    GFR calc Af Amer 51 (*)    All other components within normal limits  URINALYSIS, ROUTINE W REFLEX MICROSCOPIC - Abnormal; Notable for the following:    Ketones, ur 15 (*)    Protein, ur 100 (*)    All other components within normal limits  URINE MICROSCOPIC-ADD ON - Abnormal; Notable for the following:    Casts GRANULAR CAST (*)    All other components within normal limits  URINE CULTURE  CULTURE, BLOOD (ROUTINE X 2)  CULTURE, BLOOD (ROUTINE X 2)  TROPONIN I  PROTIME-INR  APTT  MAGNESIUM  CG4 I-STAT (LACTIC ACID)   Imaging Review Dg Chest Port 1 View  08/22/2013    CLINICAL DATA:  Cough  EXAM: PORTABLE CHEST - 1 VIEW  COMPARISON:  08/22/2013  FINDINGS: Cardiac shadow is stable. The lungs are hyperinflated consistent with COPD. No focal infiltrate or sizable effusion is noted. No  pneumothorax is seen. Old rib fractures are again noted on the right.  IMPRESSION: No acute abnormality noted.   Electronically Signed   By: Alcide Clever M.D.   On: 08/22/2013 12:00    EKG Interpretation    Date/Time:  Tuesday August 22 2013 12:56:29 EST Ventricular Rate:  96 PR Interval:  170 QRS Duration: 84 QT Interval:  355 QTC Calculation: 449 R Axis:   91 Text Interpretation:  Sinus arrhythmia Right axis deviation ED PHYSICIAN INTERPRETATION AVAILABLE IN [facility] Confirmed by TEST, RECORD ([id number]) on 08/24/2013 11:02:39 AM            MDM  No diagnosis found.   Date: 08/25/2013  Rate: 744  Rhythm: atrial fibrillation  QRS Axis: normal  Intervals: normal  ST/T Wave abnormalities: nonspecific ST/T changes  Conduction Disutrbances:none  Narrative Interpretation:   Old EKG Reviewed: none available  Pt comes in with tachycardia, fever, increased RR (3/4 SIRs criteria), and a productive cough - found to be in afib with RVR. Pt's CHADSs 2 score is 2 - (age, DM) - so we have started him on heparin drip. Pt given iv blous diltiazem, and he responded well, and went into sinus rhythm. We gave him some oral dilt in the interval. Pt denies any lung dx, no CHF, no substance abuse, No PE, DVT risk factors. Pt's Xray show no PNA. I have sent flu swab. No antibiotics for now, as we are not sure what the source is - and this could be just viral infection. We will get urine and blood cultures.   CRITICAL CARE Performed by: Derwood Kaplan   Total critical care time:  45 minutes for Afib with RVR, possible sepsis - iv fluids, iv diltiazem and iv heparin.  Critical care time was exclusive of separately billable procedures and treating other  patients.  Critical care was necessary to treat or prevent imminent or life-threatening deterioration.  Critical care was time spent personally by me on the following activities: development of treatment plan with patient and/or surrogate as well as nursing, discussions with consultants, evaluation of patient's response to treatment, examination of patient, obtaining history from patient or surrogate, ordering and performing treatments and interventions, ordering and review of laboratory studies, ordering and review of radiographic studies, pulse oximetry and re-evaluation of patient's condition.       Derwood Kaplan, MD 08/25/13 [phone]

## 2013-08-22 NOTE — Progress Notes (Signed)
Utilization Review Completed.Tiye Huwe T12/05/2013  

## 2013-08-23 ENCOUNTER — Encounter (HOSPITAL_COMMUNITY): Payer: Self-pay | Admitting: General Practice

## 2013-08-23 ENCOUNTER — Other Ambulatory Visit: Payer: Self-pay

## 2013-08-23 ENCOUNTER — Ambulatory Visit: Payer: Medicare Other | Admitting: Family Medicine

## 2013-08-23 DIAGNOSIS — J441 Chronic obstructive pulmonary disease with (acute) exacerbation: Secondary | ICD-10-CM | POA: Diagnosis not present

## 2013-08-23 DIAGNOSIS — K7460 Unspecified cirrhosis of liver: Secondary | ICD-10-CM | POA: Diagnosis not present

## 2013-08-23 DIAGNOSIS — I4891 Unspecified atrial fibrillation: Secondary | ICD-10-CM

## 2013-08-23 DIAGNOSIS — I129 Hypertensive chronic kidney disease with stage 1 through stage 4 chronic kidney disease, or unspecified chronic kidney disease: Secondary | ICD-10-CM | POA: Diagnosis not present

## 2013-08-23 DIAGNOSIS — N184 Chronic kidney disease, stage 4 (severe): Secondary | ICD-10-CM | POA: Diagnosis not present

## 2013-08-23 LAB — CBC
MCHC: 34.1 g/dL (ref 30.0–36.0)
Platelets: 118 10*3/uL — ABNORMAL LOW (ref 150–400)
RDW: 13.4 % (ref 11.5–15.5)
WBC: 4.3 10*3/uL (ref 4.0–10.5)

## 2013-08-23 LAB — TSH: TSH: 1.351 u[IU]/mL (ref 0.350–4.500)

## 2013-08-23 MED ORDER — LEVOFLOXACIN 750 MG PO TABS
750.0000 mg | ORAL_TABLET | ORAL | Status: DC
  Administered 2013-08-24: 750 mg via ORAL
  Filled 2013-08-23: qty 1

## 2013-08-23 MED ORDER — METOPROLOL TARTRATE 50 MG PO TABS
50.0000 mg | ORAL_TABLET | Freq: Two times a day (BID) | ORAL | Status: DC
  Administered 2013-08-23 – 2013-08-24 (×3): 50 mg via ORAL
  Filled 2013-08-23 (×5): qty 1

## 2013-08-23 NOTE — Progress Notes (Signed)
SATURATION QUALIFICATIONS: (This note is used to comply with regulatory documentation for home oxygen)  Patient Saturations on Room Air at Rest =%  Patient Saturations on Room Air while Ambulating = 86%  Patient Saturations on 2 Liters of oxygen at rest= 92%  Please briefly explain why patient needs home oxygen: Hypoxic and short of breath ambulating without oxygen Sheran Lawless, [place] [phone] 08/23/2013

## 2013-08-23 NOTE — Evaluation (Signed)
Physical Therapy Evaluation Patient Details Name: Erik Mullins MRN: [id number] DOB: Apr 15, 1931 Today's Date: 08/23/2013 Time: 1520-1540 PT Time Calculation (min): 20 min  PT Assessment / Plan / Recommendation History of Present Illness  Erik Mullins is a 79 y.o. male with PMH of HTN, CKD, HPL,PAD, CVA, AAA s/p repair, prostate cancer, carotid artery stenosis s/p R CEA, hepatitis,  Gout, presented with productive cough, fever, SOB and found to be a fib RVR; denies chest pain, no nausea, vomiting or diarrhea.  seen by cards in ED, started on xarelto, IV cardizem converted to NSR spontaneously .  Clinical Impression  Patient presents close to baseline.  Still reports short of breath with ambulation on room air and noted to be hypoxic.  Will need to be checked again in am by nursing to determine if needs home O2.  No further skilled PT needs at this time.    PT Assessment  Patent does not need any further PT services    Follow Up Recommendations  No PT follow up;Supervision - Intermittent    Does the patient have the potential to tolerate intense rehabilitation    N/A  Barriers to Discharge  None      Equipment Recommendations  None recommended by PT    Recommendations for Other Services     Frequency      Precautions / Restrictions Precautions Precautions: None   Pertinent Vitals/Pain No pain complaints; note SpO2 on O2 saturation note      Mobility  Bed Mobility Bed Mobility: Supine to Sit;Sit to Supine Supine to Sit: 6: Modified independent (Device/Increase time);HOB elevated Sit to Supine: 6: Modified independent (Device/Increase time);HOB elevated Transfers Transfers: Sit to Stand;Stand to Sit Sit to Stand: 6: Modified independent (Device/Increase time);From bed;With upper extremity assist Stand to Sit: 6: Modified independent (Device/Increase time);To bed;With upper extremity assist Ambulation/Gait Ambulation/Gait Assistance: 7: Independent Ambulation Distance  (Feet): 200 Feet Assistive device: None Ambulation/Gait Assistance Details: no loss of balance or veering from straight path seen negotiating around corners and with head turns for scanning environment Gait Pattern: Step-through pattern;Within Functional Limits    Exercises Other Exercises Other Exercises: did caution patient importance of vision for balance due to reports of loss of balance getting up to bathroom at night (no falls reported, however)      PT Goals(Current goals can be found in the care plan section) Acute Rehab PT Goals PT Goal Formulation: No goals set, d/c therapy  Visit Information  Last PT Received On: 08/23/13 Assistance Needed: +1 History of Present Illness: ADELFO Mullins is a 79 y.o. male with PMH of HTN, CKD, HPL,PAD, CVA, AAA s/p repair, prostate cancer, carotid artery stenosis s/p R CEA, hepatitis,  Gout, presented with productive cough, fever, SOB and found to be a fib RVR; denies chest pain, no nausea, vomiting or diarrhea.  seen by cards in ED, started on xarelto, IV cardizem converted to NSR spontaneously .       Prior Functioning  Home Living Family/patient expects to be discharged to:: Private residence Living Arrangements: Spouse/significant other Available Help at Discharge: Family;Available PRN/intermittently Type of Home: House Home Access: Stairs to enter [employer] of Steps: 3 Entrance Stairs-Rails: Left Home Layout: One level Home Equipment: None Prior Function Level of Independence: Independent Communication Communication: HOH    Cognition  Cognition Arousal/Alertness: Awake/alert Behavior During Therapy: WFL for tasks assessed/performed Overall Cognitive Status: Within Functional Limits for tasks assessed    Extremity/Trunk Assessment Lower Extremity Assessment Lower Extremity Assessment: Overall  WFL for tasks assessed   Balance Balance Balance Assessed: Yes Dynamic Standing Balance Dynamic Standing - Balance  Support: No upper extremity supported;During functional activity Dynamic Standing - Level of Assistance: 5: Stand by assistance Dynamic Standing - Comments: leaning over to fix linens on bed  End of Session PT - End of Session Equipment Utilized During Treatment: Gait belt Activity Tolerance: Patient tolerated treatment well (but with decreased SpO2) Patient left: in bed;with call bell/phone within reach;with family/visitor present  GP     [facility] 08/23/2013, 4:18 PM Sheran Lawless, PT [phone] 08/23/2013

## 2013-08-23 NOTE — Progress Notes (Signed)
   CARDIOLOGY ROUNDING NOTE    Patient Name: Erik Mullins Date of Encounter: 08/23/2013    SUBJECTIVE:Patient feels improved this morning. He is back in SR on Diltiazem drip.    Echo pending.   TELEMETRY: Reviewed telemetry pt in afib with controlled ventricular response overnight, now in SR Filed Vitals:   08/22/13 1430 08/22/13 1504 08/22/13 2004 08/23/13 0433  BP: 137/81 123/83 142/71 135/70  Pulse: 85 86 79 74  Temp:  98.5 F (36.9 C) 98.6 F (37 C) 98.3 F (36.8 C)  TempSrc:   Oral Oral  Resp: 19 20 20 18   Height:  5\' 11"  (1.803 m)    Weight:  170 lb 1.6 oz (77.157 kg)    SpO2: 96% 95% 95% 96%    Intake/Output Summary (Last 24 hours) at 08/23/13 0703 Last data filed at 08/23/13 0433  Gross per 24 hour  Intake   2240 ml  Output    650 ml  Net   1590 ml    CURRENT MEDICATIONS: . allopurinol  100 mg Oral Daily  . atorvastatin  10 mg Oral q1800  . diltiazem  180 mg Oral Daily  . ketoconazole  1 application Topical BID  . levofloxacin (LEVAQUIN) IV  750 mg Intravenous Q48H  . lisinopril  10 mg Oral Daily  . metoprolol tartrate  25 mg Oral QHS  . mometasone-formoterol  2 puff Inhalation BID  . rivaroxaban  15 mg Oral Q supper  . sodium chloride  3 mL Intravenous Q12H  . tiotropium  18 mcg Inhalation Daily  . zinc oxide  1 application Topical BID    LABS: Basic Metabolic Panel:  Recent Labs  40/98/11 1055  NA 136  K 4.8  CL 99  CO2 24  GLUCOSE 90  BUN 23  CREATININE 1.45*  CALCIUM 9.0  MG 1.8   CBC:  Recent Labs  08/22/13 1055 08/23/13 0550  WBC 7.2 4.3  NEUTROABS 5.9  --   HGB 16.4 13.7  HCT 47.5 40.2  MCV 92.1 90.5  PLT 145* 118*   Cardiac Enzymes:  Recent Labs  08/22/13 1055  TROPONINI <0.30   Thyroid Function Tests:  Recent Labs  08/22/13 1540  TSH 1.351    Radiology/Studies:  Dg Chest Port 1 View 08/22/2013   CLINICAL DATA:  Cough  EXAM: PORTABLE CHEST - 1 VIEW  COMPARISON:  08/22/2013  FINDINGS: Cardiac shadow is  stable. The lungs are hyperinflated consistent with COPD. No focal infiltrate or sizable effusion is noted. No pneumothorax is seen. Old rib fractures are again noted on the right.  IMPRESSION: No acute abnormality noted.   Electronically Signed   By: Alcide Clever M.D.   On: 08/22/2013 12:00     PHYSICAL EXAM     Principal Problem:   Atrial fibrillation with RVR Active Problems:   HYPERLIPIDEMIA   Essential hypertension, benign   CHRONIC KIDNEY DISEASE UNSPECIFIED   Fever   Tobacco user   Carotid artery stenosis   D/c dilt awaut echo Change BB to bid

## 2013-08-23 NOTE — Progress Notes (Signed)
Paged and spoke with Ward Givens PA after receiving a call from centralize monitoring that the patient was converting to A-fib and Aflutter back  To NSR.Patient remains asymptomatic. Will continue to monitor for patient safety.

## 2013-08-23 NOTE — Progress Notes (Signed)
[facility]                                                                                Patient Demographics  Erik Mullins, is a 79 y.o. male, DOB - Mar 02, 1931, ZOX:[id number]  Admit date - 08/22/2013   Admitting Physician Esperanza Sheets, MD  Outpatient Primary MD for the patient is [name], MD  LOS - 1   Chief Complaint  Patient presents with  . Atrial Fibrillation  . Cough        Assessment & Plan    79 y.o. male with PMH of HTN, CKD, HPL, PAD, CVA, AAA s/p repair, prostate cancer, carotid artery stenosis s/p R CEA, hepatitis, Gout, presented with productive cough, fever likely secondary to acute bronchitis, SOB and found fib RVR    1. A fib RVR; new onset, currently in sinus after initial Cardizem drip, cardiology following, transitioned to Lopressor higher than home dose and xaralto I. cardiology. Stable TSH and echo ordered is pending.    2. COPD - no acute issues, no wheezing on exam, currently appears to be in no distress, will increase activity and titrate off oxygen. As needed nebulizer treatments.    3. Acute bronchitis. Fevers have resolved, follow cultures, stable on Levaquin. Chest x-ray stable. PCR negative.     4. HTN- stable or home dose lisinopril along with [place].    5. CKD stage IV. Creatinine at baseline.       Code Status: Full  Family Communication: None present  Disposition Plan: To be decided, PT eval requested   Procedures echogram ordered   Consults  cardiology   Medications  Scheduled Meds: . allopurinol  100 mg Oral Daily  . atorvastatin  10 mg Oral q1800  . ketoconazole  1 application Topical BID  . [START ON 08/24/2013] levofloxacin  750 mg Oral Q48H  . lisinopril  10 mg Oral Daily  . metoprolol tartrate  50 mg Oral BID  . mometasone-formoterol  2 puff Inhalation BID  . rivaroxaban  15 mg Oral Q supper  . sodium chloride  3 mL Intravenous Q12H  . tiotropium  18 mcg Inhalation Daily  .  zinc oxide  1 application Topical BID   Continuous Infusions:  PRN Meds:.acetaminophen, albuterol, ondansetron (ZOFRAN) IV, ondansetron  DVT Prophylaxis   xaralto    Lab Results  Component Value Date   PLT 118* 08/23/2013    Antibiotics     Anti-infectives   Start     Dose/Rate Route Frequency Ordered Stop   08/24/13 1000  levofloxacin (LEVAQUIN) tablet 750 mg     750 mg Oral Every 48 hours 08/23/13 0852     08/22/13 1515  levofloxacin (LEVAQUIN) IVPB 750 mg  Status:  Discontinued     750 mg 100 mL/hr over 90 Minutes Intravenous Every 48 hours 08/22/13 1503 08/23/13 8119          Subjective:   Floyde Mullins today has, No headache, No chest pain, No abdominal pain - No Nausea, No new weakness tingling or numbness, much improved Cough - SOB.    Objective:   Filed Vitals:   08/22/13 1430 08/22/13 1504 08/22/13 2004 08/23/13 1478  BP: 137/81 123/83 142/71 135/70  Pulse: 85 86 79 74  Temp:  98.5 F (36.9 C) 98.6 F (37 C) 98.3 F (36.8 C)  TempSrc:   Oral Oral  Resp: 19 20 20 18   Height:  5\' 11"  (1.803 m)    Weight:  77.157 kg (170 lb 1.6 oz)    SpO2: 96% 95% 95% 96%    Wt Readings from Last 3 Encounters:  08/22/13 77.157 kg (170 lb 1.6 oz)  08/17/13 79.379 kg (175 lb)  08/14/13 79.379 kg (175 lb)     Intake/Output Summary (Last 24 hours) at 08/23/13 1048 Last data filed at 08/23/13 0800  Gross per 24 hour  Intake   2480 ml  Output   1000 ml  Net   1480 ml    Exam Awake Alert, Oriented X 3, No new F.N deficits, Normal affect [place].AT,PERRAL Supple Neck,No JVD, No cervical lymphadenopathy appriciated.  Symmetrical Chest wall movement, Good air movement bilaterally, CTAB iRRR,No Gallops,Rubs or new Murmurs, No Parasternal Heave +ve B.Sounds, Abd Soft, Non tender, No organomegaly appriciated, No rebound - guarding or rigidity. No Cyanosis, Clubbing or edema, No new Rash or bruise     Data Review   Micro Results Recent Results (from the past 240 hour(s))   CULTURE, BLOOD (ROUTINE X 2)     Status: None   Collection Time    08/22/13 10:55 AM      Result Value Range Status   Specimen Description BLOOD LEFT FOREARM   Final   Special Requests BOTTLES DRAWN AEROBIC AND ANAEROBIC 5CCS   Final   Culture  Setup Time     Final   Value: 08/22/2013 16:39     Performed at [facility]   Culture     Final   Value:        BLOOD CULTURE RECEIVED NO GROWTH TO DATE CULTURE WILL BE HELD FOR 5 DAYS BEFORE ISSUING A FINAL NEGATIVE REPORT     Performed at [facility]   Report Status PENDING   Incomplete  CULTURE, BLOOD (ROUTINE X 2)     Status: None   Collection Time    08/22/13 11:15 AM      Result Value Range Status   Specimen Description BLOOD HAND RIGHT   Final   Special Requests BOTTLES DRAWN AEROBIC AND ANAEROBIC 10CC   Final   Culture  Setup Time     Final   Value: 08/22/2013 16:39     Performed at [facility]   Culture     Final   Value:        BLOOD CULTURE RECEIVED NO GROWTH TO DATE CULTURE WILL BE HELD FOR 5 DAYS BEFORE ISSUING A FINAL NEGATIVE REPORT     Performed at [facility]   Report Status PENDING   Incomplete    Radiology Reports Dg Chest Port 1 View  08/22/2013   CLINICAL DATA:  Cough  EXAM: PORTABLE CHEST - 1 VIEW  COMPARISON:  08/22/2013  FINDINGS: Cardiac shadow is stable. The lungs are hyperinflated consistent with COPD. No focal infiltrate or sizable effusion is noted. No pneumothorax is seen. Old rib fractures are again noted on the right.  IMPRESSION: No acute abnormality noted.   Electronically Signed   By: Alcide Clever M.D.   On: 08/22/2013 12:00    CBC  Recent Labs Lab 08/22/13 1055 08/23/13 0550  WBC 7.2 4.3  HGB 16.4 13.7  HCT 47.5 40.2  PLT 145* 118*  MCV 92.1 90.5  MCH 31.8 30.9  MCHC 34.5 34.1  RDW 13.2 13.4  LYMPHSABS 0.7  --   MONOABS 0.6  --   EOSABS 0.0  --   BASOSABS 0.0  --    Lab Results  Component Value Date   TSH 1.351 08/22/2013    Chemistries   Recent  Labs Lab 08/22/13 1055  NA 136  K 4.8  CL 99  CO2 24  GLUCOSE 90  BUN 23  CREATININE 1.45*  CALCIUM 9.0  MG 1.8   ------------------------------------------------------------------------------------------------------------------ estimated creatinine clearance is 44 ml/min (by C-G formula based on Cr of 1.45). ------------------------------------------------------------------------------------------------------------------ No results found for this basename: HGBA1C,  in the last 72 hours ------------------------------------------------------------------------------------------------------------------ No results found for this basename: CHOL, HDL, LDLCALC, TRIG, CHOLHDL, LDLDIRECT,  in the last 72 hours ------------------------------------------------------------------------------------------------------------------  Recent Labs  08/22/13 1540  TSH 1.351   ------------------------------------------------------------------------------------------------------------------ No results found for this basename: VITAMINB12, FOLATE, FERRITIN, TIBC, IRON, RETICCTPCT,  in the last 72 hours  Coagulation profile  Recent Labs Lab 08/22/13 1055  INR 0.98    No results found for this basename: DDIMER,  in the last 72 hours  Cardiac Enzymes  Recent Labs Lab 08/22/13 1055  TROPONINI <0.30   ------------------------------------------------------------------------------------------------------------------ No components found with this basename: POCBNP,      Time Spent in minutes   35   SINGH,PRASHANT K M.D on 08/23/2013 at 10:48 AM  Between 7am to 7pm - Pager - [phone]  After 7pm go to [email or web address] - password [email or web address]  And look for the night coverage person covering for me after hours  [facility] Office  [phone]

## 2013-08-23 NOTE — Progress Notes (Signed)
*  PRELIMINARY RESULTS* Echocardiogram 2D Echocardiogram has been performed.  Erik Mullins 08/23/2013, 10:37 AM

## 2013-08-24 DIAGNOSIS — N189 Chronic kidney disease, unspecified: Secondary | ICD-10-CM

## 2013-08-24 DIAGNOSIS — F17200 Nicotine dependence, unspecified, uncomplicated: Secondary | ICD-10-CM

## 2013-08-24 DIAGNOSIS — I10 Essential (primary) hypertension: Secondary | ICD-10-CM

## 2013-08-24 DIAGNOSIS — J209 Acute bronchitis, unspecified: Secondary | ICD-10-CM

## 2013-08-24 DIAGNOSIS — I4891 Unspecified atrial fibrillation: Secondary | ICD-10-CM

## 2013-08-24 DIAGNOSIS — J441 Chronic obstructive pulmonary disease with (acute) exacerbation: Principal | ICD-10-CM

## 2013-08-24 LAB — CBC
HCT: 46.2 % (ref 39.0–52.0)
MCHC: 33.8 g/dL (ref 30.0–36.0)
RBC: 4.98 MIL/uL (ref 4.22–5.81)
RDW: 13.7 % (ref 11.5–15.5)
WBC: 4.4 10*3/uL (ref 4.0–10.5)

## 2013-08-24 LAB — URINE CULTURE: Colony Count: 75000

## 2013-08-24 MED ORDER — CIPROFLOXACIN HCL 250 MG PO TABS: 250.0000 mg | ORAL_TABLET | Freq: Two times a day (BID) | ORAL | Status: DC

## 2013-08-24 MED ORDER — RIVAROXABAN 15 MG PO TABS: 15.0000 mg | ORAL_TABLET | Freq: Every day | ORAL | Status: DC

## 2013-08-24 MED ORDER — TIOTROPIUM BROMIDE MONOHYDRATE 18 MCG IN CAPS: 18.0000 ug | ORAL_CAPSULE | Freq: Every day | RESPIRATORY_TRACT | Status: DC

## 2013-08-24 MED ORDER — MOMETASONE FURO-FORMOTEROL FUM 100-5 MCG/ACT IN AERO: 2.0000 | INHALATION_SPRAY | Freq: Two times a day (BID) | RESPIRATORY_TRACT | Status: DC

## 2013-08-24 NOTE — Progress Notes (Signed)
Patient: Erik Mullins Date of Encounter: 08/24/2013, 9:09 AM Admit date: 08/22/2013     Subjective  Mr. Gaida reports he is feeling better. He denies CP, SOB or palpitations.   Objective  Physical Exam: Vitals: BP 144/72  Pulse 69  Temp(Src) 98.6 F (37 C) (Oral)  Resp 18  Ht 5\' 11"  (1.803 m)  Wt 170 lb 1.6 oz (77.157 kg)  BMI 23.73 kg/m2  SpO2 99% General: Well developed, well appearing 79 year old male in no acute distress. Neck: Supple. JVD not elevated. Lungs: Expiratory wheezes bilaterally otherwise clear to auscultation without rales, or rhonchi. Breathing is unlabored. Heart: RRR S1 S2 without murmurs, rubs, or gallops.  Abdomen: Soft, non-distended. Extremities: No clubbing or cyanosis. No edema.  Distal pedal pulses are 2+ and equal bilaterally. Neuro: Alert and oriented X 3. Moves all extremities spontaneously. No focal deficits.  Intake/Output:  Intake/Output Summary (Last 24 hours) at 08/24/13 0909 Last data filed at 08/23/13 1700  Gross per 24 hour  Intake    615 ml  Output    250 ml  Net    365 ml    Inpatient Medications:  . allopurinol  100 mg Oral Daily  . atorvastatin  10 mg Oral q1800  . ketoconazole  1 application Topical BID  . levofloxacin  750 mg Oral Q48H  . lisinopril  10 mg Oral Daily  . metoprolol tartrate  50 mg Oral BID  . mometasone-formoterol  2 puff Inhalation BID  . rivaroxaban  15 mg Oral Q supper  . sodium chloride  3 mL Intravenous Q12H  . tiotropium  18 mcg Inhalation Daily  . zinc oxide  1 application Topical BID    Labs: Influenza A, B and H1N1 negative by PCR  Recent Labs  08/22/13 1055  NA 136  K 4.8  CL 99  CO2 24  GLUCOSE 90  BUN 23  CREATININE 1.45*  CALCIUM 9.0  MG 1.8    Recent Labs  08/22/13 1055 08/23/13 0550 08/24/13 0435  WBC 7.2 4.3 4.4  NEUTROABS 5.9  --   --   HGB 16.4 13.7 15.6  HCT 47.5 40.2 46.2  MCV 92.1 90.5 92.8  PLT 145* 118* 110*    Recent Labs  08/22/13 1055  TROPONINI  <0.30    Recent Labs  08/22/13 1540  TSH 1.351    Recent Labs  08/22/13 1055  INR 0.98    Radiology/Studies: Dg Chest Port 1 View  08/22/2013   CLINICAL DATA:  Cough  EXAM: PORTABLE CHEST - 1 VIEW  COMPARISON:  08/22/2013  FINDINGS: Cardiac shadow is stable. The lungs are hyperinflated consistent with COPD. No focal infiltrate or sizable effusion is noted. No pneumothorax is seen. Old rib fractures are again noted on the right.  IMPRESSION: No acute abnormality noted.   Electronically Signed   By: Alcide Clever M.D.   On: 08/22/2013 12:00    Echocardiogram: Left ventricle: The cavity size was normal. Wall thickness was normal. Systolic function was normal. The estimated ejection fraction was in the range of 60% to 65%. ------------------------------------------------------------ Aortic valve: Structurally normal valve. Cusp separation was normal. Doppler: Transvalvular velocity was within the normal range. There was no stenosis. No regurgitation. ------------------------------------------------------------ Aorta: Aortic root: The aortic root was normal in size. Ascending aorta: The ascending aorta was normal in size. ------------------------------------------------------------ Mitral valve: Structurally normal valve. Leaflet separation was normal. Doppler: Transvalvular velocity was within the normal range. There was no evidence for stenosis.  No regurgitation. ------------------------------------------------------------ Left atrium: The atrium was normal in size. ------------------------------------------------------------ Right ventricle: The cavity size was normal. Systolic function was normal. ------------------------------------------------------------ Pulmonic valve: Structurally normal valve. Cusp separation was normal. Doppler: Transvalvular velocity was within the normal range. No  regurgitation. ------------------------------------------------------------ Tricuspid valve: Structurally normal valve. Leaflet separation was normal. Doppler: Transvalvular velocity was within the normal range. No regurgitation. ------------------------------------------------------------ Right atrium: The atrium was normal in size. ------------------------------------------------------------ Pericardium: There was no pericardial effusion.   Telemetry: NSR currently   Assessment and Plan  1. AF w/ RVR  - newly diagnosed in setting of fever and URI - now back in SR - TSH normal - normal LVEF and normal LA size - suspect triggered by acute illness  - high CHADS2-VASc score so Xarelto started yesterday for stroke prevention - on diltiazem for rate control 2. CKD 3. HTN 4. PVD 5. Prior CVA  Dr. Ladona Ridgel to see Signed, Rick Duff PA-C

## 2013-08-24 NOTE — Progress Notes (Signed)
Went over discharge instructions with patient, wife at bedside. Patient had no additional questions or concerns related to discharge. IV d/c'd. Patient taken off heart monitor. Patient stable, and ready for discharge. Discharged home with wife. Stanton Kidney R

## 2013-08-24 NOTE — Care Management Note (Signed)
    Page 1 of 1   08/24/2013     4:32:55 PM   CARE MANAGEMENT NOTE 08/24/2013  Patient:  Erik Mullins, Erik Mullins   Account Number:  [id number]  Date Initiated:  08/24/2013  Documentation initiated by:  Annel Zunker  Subjective/Objective Assessment:   PT ADM WITH AFIB WITH RVR ON 08/22/13.  PTA, PT INDEPENDENT, LIVES WITH SPOUSE.     Action/Plan:   PT TO DC ON XARELTO.  WILL CHECK COVERAGE.  PT HAS 30 DAY FREE TRIAL CARD.   Anticipated DC Date:  08/24/2013   Anticipated DC Plan:  HOME/SELF CARE      DC Planning Services  CM consult  Medication Assistance      Choice offered to / List presented to:             Status of service:  Completed, signed off Medicare Important Message given?   (If response is "NO", the following Medicare IM given date fields will be blank) Date Medicare IM given:   Date Additional Medicare IM given:    Discharge Disposition:  HOME/SELF CARE  Per UR Regulation:  Reviewed for med. necessity/level of care/duration of stay  If discussed at Long Length of Stay Meetings, dates discussed:    Comments:  08/24/13 Rosalita Chessman [phone] per rep at [facility]: xarelto is covered, tier 3, co-pay $35.00 preferred retail, $45.00 non-preferred, no auth required  preferred pharmacies are: [facility], [facility], [facility], [facility], [facility]

## 2013-08-24 NOTE — Discharge Summary (Signed)
Physician Discharge Summary  CORRIE Mullins JXB:[id number] DOB: [birth year] DOA: 08/22/2013  PCP: [name], MD  Admit date: 08/22/2013 Discharge date: 08/24/2013  Time spent: 60 minutes  Recommendations for Outpatient Follow-up:  1) Mr Erik Mullins is to follow up with Dr. Modesto Charon within two weeks of discharge.  At that time his breathing and heart rate will need to be reassessed.  2) He will need to follow up with cardiology within 1-2 weeks and this will be arranged by the cardiology service   Discharge Diagnoses:  1) atrial fibrillation with RVR 2) acute bronchitis 3) hyperlipidemia 4) hypertension 5) chronic kidney disease 3 6) tobacco use   Discharge Condition: stable Diet recommendation: heart healthy   Filed Weights   08/22/13 1031 08/22/13 1504  Weight: 79.379 kg (175 lb) 77.157 kg (170 lb 1.6 oz)    History of present illness:  Erik Mullins is a 79 y.o. male with PMH of HTN, CKD, HPL,PAD, CVA, AAA s/p repair, prostate cancer, carotid artery stenosis s/p R CEA, hepatitis, Gout, presented with productive cough, fever, SOB and found to be a fib RVR; denies chest pain, no nausea, vomiting or diarrhea .  Patient was seen by cards in ED, started on xarelto, IV cardizem converted to NSR spontaneously.   Hospital Course:  1) Atrial fibrillation with RVR: likely caused by respiratory infection.  Converted back to NSR in the ED.  TSH normal. ECHO normal.  He was followed by cardiology in hospital. Started on diltiazem for rate control and Xarelto for stroke prevention. 2) acute bronchitis: reports that prior to presentation was coughing continuously for several days.  Now stable with no respiratory complaints. Discharged on ciprofloxacin for URI.  He is a current smoker and should be evaluated for possible COPD in the future.  He is also discharged on spiriva and dulera to improve respiratory function. 3) hyperlipidemia: continue statin 4) hypertension: continue previous  regimen 5) CKD3: continue to monitor. Stable during hospitalization. 6) tobacco use: cessation counseling provided.  Procedures:  2D ECHO 12/10 : EF 60-65%. Structurally normal.  Consultations:  Cardiology  Discharge Exam: Filed Vitals:   08/24/13 1105  BP: 142/86  Pulse: 82  Temp:   Resp:     General: no distress, alert, calm, comfortable, hard of hearing Cardiovascular: RRR no MRG Respiratory: CTAB, good air movement, lots of coughing with conversation and exam  Discharge Instructions  Discharge Orders   Future Appointments Provider Department Dept Phone   10/24/2013 8:20 AM Ileana Ladd, MD [facility] [phone]   Future Orders Complete By Expires   Call MD for:  difficulty breathing, headache or visual disturbances  As directed    Call MD for:  temperature >100.4  As directed    Diet - low sodium heart healthy  As directed    Increase activity slowly  As directed        Medication List    STOP taking these medications       cephALEXin 500 MG capsule  Commonly known as:  KEFLEX      TAKE these medications       allopurinol 100 MG tablet  Commonly known as:  ZYLOPRIM  Take 1 tablet by mouth  every day     ASPIR-LOW 81 MG EC tablet  Generic drug:  aspirin  Take 81 mg by mouth daily.     CALTRATE 600+D 600-400 MG-UNIT per tablet  Generic drug:  Calcium Carbonate-Vitamin D  Take 1 tablet by mouth daily.  ciprofloxacin 250 MG tablet  Commonly known as:  CIPRO  Take 1 tablet (250 mg total) by mouth 2 (two) times daily.     DAILY MULTIPLE VITAMINS PO  Take by mouth. 1 tablet daily     GUAIFENESIN DM PO  Take 2 tablets by mouth every evening.     ketoconazole 2 % cream  Commonly known as:  NIZORAL  Apply 1 application topically 2 (two) times daily.     lisinopril 10 MG tablet  Commonly known as:  PRINIVIL,ZESTRIL  TAKE 1 TABLET BY MOUTH EVERY DAY     metoprolol tartrate 25 MG tablet  Commonly known as:  LOPRESSOR   Take 1 tablet by mouth two  times daily     mometasone-formoterol 100-5 MCG/ACT Aero  Commonly known as:  DULERA  Inhale 2 puffs into the lungs 2 (two) times daily.     Rivaroxaban 15 MG Tabs tablet  Commonly known as:  XARELTO  Take 1 tablet (15 mg total) by mouth daily with supper.     simvastatin 20 MG tablet  Commonly known as:  ZOCOR  Take 1 tablet by mouth at  bedtime     tiotropium 18 MCG inhalation capsule  Commonly known as:  SPIRIVA  Place 1 capsule (18 mcg total) into inhaler and inhale daily.     zinc oxide 11.3 % Crea cream  Commonly known as:  BALMEX  Apply 1 application topically 2 (two) times daily.       Allergies  Allergen Reactions  . Morphine Other (See Comments)    hallucinate      The results of significant diagnostics from this hospitalization (including imaging, microbiology, ancillary and laboratory) are listed below for reference.    Significant Diagnostic Studies: Dg Chest Port 1 View  08/22/2013   CLINICAL DATA:  Cough  EXAM: PORTABLE CHEST - 1 VIEW  COMPARISON:  08/22/2013  FINDINGS: Cardiac shadow is stable. The lungs are hyperinflated consistent with COPD. No focal infiltrate or sizable effusion is noted. No pneumothorax is seen. Old rib fractures are again noted on the right.  IMPRESSION: No acute abnormality noted.   Electronically Signed   By: Alcide Clever M.D.   On: 08/22/2013 12:00    Microbiology: Recent Results (from the past 240 hour(s))  CULTURE, BLOOD (ROUTINE X 2)     Status: None   Collection Time    08/22/13 10:55 AM      Result Value Range Status   Specimen Description BLOOD LEFT FOREARM   Final   Special Requests BOTTLES DRAWN AEROBIC AND ANAEROBIC 5CCS   Final   Culture  Setup Time     Final   Value: 08/22/2013 16:39     Performed at [facility]   Culture     Final   Value:        BLOOD CULTURE RECEIVED NO GROWTH TO DATE CULTURE WILL BE HELD FOR 5 DAYS BEFORE ISSUING A FINAL NEGATIVE REPORT     Performed at  [facility]   Report Status PENDING   Incomplete  CULTURE, BLOOD (ROUTINE X 2)     Status: None   Collection Time    08/22/13 11:15 AM      Result Value Range Status   Specimen Description BLOOD HAND RIGHT   Final   Special Requests BOTTLES DRAWN AEROBIC AND ANAEROBIC 10CC   Final   Culture  Setup Time     Final   Value: 08/22/2013 16:39  Performed at [facility]     Final   Value:        BLOOD CULTURE RECEIVED NO GROWTH TO DATE CULTURE WILL BE HELD FOR 5 DAYS BEFORE ISSUING A FINAL NEGATIVE REPORT     Performed at [facility]   Report Status PENDING   Incomplete  URINE CULTURE     Status: None   Collection Time    08/22/13 12:37 PM      Result Value Range Status   Specimen Description URINE, RANDOM   Final   Special Requests NONE   Final   Culture  Setup Time     Final   Value: 08/22/2013 13:00     Performed at [facility] Count     Final   Value: 75,000 COLONIES/ML     Performed at [facility]   Culture     Final   Value: PSEUDOMONAS AERUGINOSA     Performed at [facility]   Report Status 08/24/2013 FINAL   Final   Organism ID, Bacteria PSEUDOMONAS AERUGINOSA   Final     Labs: Basic Metabolic Panel:  Recent Labs Lab 08/22/13 1055  NA 136  K 4.8  CL 99  CO2 24  GLUCOSE 90  BUN 23  CREATININE 1.45*  CALCIUM 9.0  MG 1.8   Liver Function Tests: No results found for this basename: AST, ALT, ALKPHOS, BILITOT, PROT, ALBUMIN,  in the last 168 hours No results found for this basename: LIPASE, AMYLASE,  in the last 168 hours No results found for this basename: AMMONIA,  in the last 168 hours CBC:  Recent Labs Lab 08/22/13 1055 08/23/13 0550 08/24/13 0435  WBC 7.2 4.3 4.4  NEUTROABS 5.9  --   --   HGB 16.4 13.7 15.6  HCT 47.5 40.2 46.2  MCV 92.1 90.5 92.8  PLT 145* 118* 110*   Cardiac Enzymes:  Recent Labs Lab 08/22/13 1055  TROPONINI <0.30   BNP: BNP (last 3 results) No  results found for this basename: PROBNP,  in the last 8760 hours CBG: No results found for this basename: GLUCAP,  in the last 168 hours     Signed:  Leanord Thibeau  [facility] 08/24/2013, 1:16 PM

## 2013-08-24 NOTE — Progress Notes (Signed)
Patient ID: Erik Mullins, male   DOB: 1930/09/24, 79 y.o.   MRN: [id number] [facility]

## 2013-08-24 NOTE — Progress Notes (Signed)
Patient weaned from 2L to RA. Patient SAT 91-93% on room air, at rest. [place], [place] R

## 2013-08-25 ENCOUNTER — Telehealth: Payer: Self-pay | Admitting: Family Medicine

## 2013-08-25 NOTE — Telephone Encounter (Signed)
appt on 12/23 with wong

## 2013-08-28 LAB — CULTURE, BLOOD (ROUTINE X 2)
Culture: NO GROWTH
Culture: NO GROWTH

## 2013-09-05 ENCOUNTER — Ambulatory Visit (INDEPENDENT_AMBULATORY_CARE_PROVIDER_SITE_OTHER): Payer: Medicare Other | Admitting: Family Medicine

## 2013-09-05 ENCOUNTER — Encounter: Payer: Self-pay | Admitting: Family Medicine

## 2013-09-05 VITALS — BP 113/64 | HR 63 | Temp 97.4°F | Ht 70.5 in | Wt 175.2 lb

## 2013-09-05 DIAGNOSIS — I714 Abdominal aortic aneurysm, without rupture, unspecified: Secondary | ICD-10-CM | POA: Diagnosis not present

## 2013-09-05 DIAGNOSIS — I6529 Occlusion and stenosis of unspecified carotid artery: Secondary | ICD-10-CM | POA: Diagnosis not present

## 2013-09-05 DIAGNOSIS — Z7901 Long term (current) use of anticoagulants: Secondary | ICD-10-CM

## 2013-09-05 DIAGNOSIS — Z5181 Encounter for therapeutic drug level monitoring: Secondary | ICD-10-CM | POA: Diagnosis not present

## 2013-09-05 DIAGNOSIS — I471 Supraventricular tachycardia, unspecified: Secondary | ICD-10-CM

## 2013-09-05 DIAGNOSIS — I4891 Unspecified atrial fibrillation: Secondary | ICD-10-CM

## 2013-09-05 DIAGNOSIS — N189 Chronic kidney disease, unspecified: Secondary | ICD-10-CM | POA: Diagnosis not present

## 2013-09-05 DIAGNOSIS — E785 Hyperlipidemia, unspecified: Secondary | ICD-10-CM

## 2013-09-05 DIAGNOSIS — Z8546 Personal history of malignant neoplasm of prostate: Secondary | ICD-10-CM

## 2013-09-05 DIAGNOSIS — F17200 Nicotine dependence, unspecified, uncomplicated: Secondary | ICD-10-CM

## 2013-09-05 DIAGNOSIS — Z720 Tobacco use: Secondary | ICD-10-CM

## 2013-09-05 DIAGNOSIS — I10 Essential (primary) hypertension: Secondary | ICD-10-CM

## 2013-09-05 LAB — POCT CBC
Granulocyte percent: 69.2 %G (ref 37–80)
HCT, POC: 44 % (ref 43.5–53.7)
Hemoglobin: 14 g/dL — AB (ref 14.1–18.1)
Lymph, poc: 2.2 (ref 0.6–3.4)
MCH, POC: 28.8 pg (ref 27–31.2)
MCHC: 31.7 g/dL — AB (ref 31.8–35.4)
MCV: 90.8 fL (ref 80–97)
MPV: 8.5 fL (ref 0–99.8)
POC Granulocyte: 6.7 (ref 2–6.9)
POC LYMPH PERCENT: 22.9 %L (ref 10–50)
Platelet Count, POC: 235 10*3/uL (ref 142–424)
RBC: 4.9 M/uL (ref 4.69–6.13)
RDW, POC: 13.7 %
WBC: 9.7 10*3/uL (ref 4.6–10.2)

## 2013-09-05 NOTE — Progress Notes (Signed)
Patient ID: Erik Mullins, male   DOB: [birth year], 79 y.o.   MRN: [id number] SUBJECTIVE: CC: Chief Complaint  Patient presents with  . Hospitalization Follow-up    HPI: Recent hospitalization for A Fib with RVR. Treated with diltiazem and reversed. Has been on Xarelto without problems apparently making efforts to stop smoking. And is successful so far. Feels well. No chest pain, no palpitations.   Past Medical History  Diagnosis Date  . Essential hypertension, benign   . Hyperlipidemia   . Anemia   . History of gout   . AAA (abdominal aortic aneurysm)     Remote ~1994   . Cirrhosis   . Tobacco abuse   . PAD (peripheral artery disease)     stenting in lower extremities (no records)  . 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 Atrial fibrillation with RVR   . Complication of anesthesia     "I was hard to get out one time" (08/22/2013)  . Prostate cancer     S/P radiation; in remission  . Exertional shortness of breath   . Diabetes mellitus without complication     "I'm hyperglycemic" (08/22/2013)  . History of blood transfusion     "had 4 units put in when I was bleeding in my colon; none since" (08/22/2013)  . Hepatitis     "don't know what kind; stayed 2 months in the hospital in [place] w/it" (08/22/2013)  . Headache(784.0)     "quite a few recently when I was doing alot of coughing recently" (08/22/2013)  . Stroke     "I've had 2"; denies residual on 08/22/2013  . Arthritis     "fingers" (08/22/2013)  . Chronic kidney disease     "only have my right kidney; it's oversized" (08/22/2013)   Past Surgical History  Procedure Laterality Date  . Abdominal aortic aneurysm repair  1980's    in [place]  . Partial colectomy      "I was bleeding to death inside; took out 2/3 of my colon" (08/22/2013)  . Cholecystectomy    . Colon surgery    . Appendectomy    . Cataract extraction w/ intraocular lens  implant, bilateral Bilateral 2014  . Carotid endarterectomy  Right ~ 1999   History   Social History  . Marital Status: Married    Spouse Name: N/A    Number of Children: N/A  . Years of Education: N/A   Occupational History  . Not on file.   Social History Main Topics  . Smoking status: Current Every Day Smoker -- 0.50 packs/day for 70 years    Types: Cigarettes  . Smokeless tobacco: Former Neurosurgeon    Types: Snuff, Chew  . Alcohol Use: Yes     Comment: 08/22/2013 "drink ~ 1 beer/yr"  . Drug Use: No  . Sexual Activity: No   Other Topics Concern  . Not on file   Social History Narrative   From [place]. Relocated to this area from [place].   Family History  Problem Relation Age of Onset  . Cancer Mother     uterine deceased age 102  . Arrhythmia Other     Atrial fibrillation  . Hypertension Maternal Grandmother    Current Outpatient Prescriptions on File Prior to Visit  Medication Sig Dispense Refill  . allopurinol (ZYLOPRIM) 100 MG tablet Take 1 tablet by mouth  every day  30 tablet  3  . aspirin (ASPIR-LOW) 81 MG EC tablet Take  81 mg by mouth daily.        . Calcium Carbonate-Vitamin D (CALTRATE 600+D) 600-400 MG-UNIT per tablet Take 1 tablet by mouth daily.        . ciprofloxacin (CIPRO) 250 MG tablet Take 1 tablet (250 mg total) by mouth 2 (two) times daily.  12 tablet  0  . DAILY MULTIPLE VITAMINS PO Take by mouth. 1 tablet daily       . Dextromethorphan-Guaifenesin (GUAIFENESIN DM PO) Take 2 tablets by mouth every evening.      Marland Kitchen ketoconazole (NIZORAL) 2 % cream Apply 1 application topically 2 (two) times daily.       Marland Kitchen lisinopril (PRINIVIL,ZESTRIL) 10 MG tablet TAKE 1 TABLET BY MOUTH EVERY DAY  30 tablet  3  . metoprolol tartrate (LOPRESSOR) 25 MG tablet Take 1 tablet by mouth two  times daily  60 tablet  3  . mometasone-formoterol (DULERA) 100-5 MCG/ACT AERO Inhale 2 puffs into the lungs 2 (two) times daily.  1 Inhaler  1  . Rivaroxaban (XARELTO) 15 MG TABS tablet Take 1 tablet (15 mg total) by mouth daily with  supper.  30 tablet  0  . simvastatin (ZOCOR) 20 MG tablet Take 1 tablet by mouth at  bedtime  30 tablet  3  . tiotropium (SPIRIVA) 18 MCG inhalation capsule Place 1 capsule (18 mcg total) into inhaler and inhale daily.  30 capsule  12  . zinc oxide (BALMEX) 11.3 % CREA cream Apply 1 application topically 2 (two) times daily.       No current facility-administered medications on file prior to visit.   Allergies  Allergen Reactions  . Morphine Other (See Comments)    hallucinate   Immunization History  Administered Date(s) Administered  . Tdap 02/16/2013   Prior to Admission medications   Medication Sig Start Date End Date Taking? Authorizing Provider  allopurinol (ZYLOPRIM) 100 MG tablet Take 1 tablet by mouth  every day 08/21/13   Ileana Ladd, MD  aspirin (ASPIR-LOW) 81 MG EC tablet Take 81 mg by mouth daily.      Historical Provider, MD  Calcium Carbonate-Vitamin D (CALTRATE 600+D) 600-400 MG-UNIT per tablet Take 1 tablet by mouth daily.      Historical Provider, MD  ciprofloxacin (CIPRO) 250 MG tablet Take 1 tablet (250 mg total) by mouth 2 (two) times daily. 08/24/13   Elby Showers, MD  DAILY MULTIPLE VITAMINS PO Take by mouth. 1 tablet daily     Historical Provider, MD  Dextromethorphan-Guaifenesin (GUAIFENESIN DM PO) Take 2 tablets by mouth every evening.    Historical Provider, MD  ketoconazole (NIZORAL) 2 % cream Apply 1 application topically 2 (two) times daily.  11/08/12   Historical Provider, MD  lisinopril (PRINIVIL,ZESTRIL) 10 MG tablet TAKE 1 TABLET BY MOUTH EVERY DAY 08/03/13   Ileana Ladd, MD  metoprolol tartrate (LOPRESSOR) 25 MG tablet Take 1 tablet by mouth two  times daily 08/21/13   Ileana Ladd, MD  mometasone-formoterol (DULERA) 100-5 MCG/ACT AERO Inhale 2 puffs into the lungs 2 (two) times daily. 08/24/13   Elby Showers, MD  Rivaroxaban (XARELTO) 15 MG TABS tablet Take 1 tablet (15 mg total) by mouth daily with supper. 08/24/13   Elby Showers, MD   simvastatin (ZOCOR) 20 MG tablet Take 1 tablet by mouth at  bedtime 08/21/13   Ileana Ladd, MD  tiotropium [facility]) 18 MCG inhalation capsule Place 1 capsule (18 mcg total) into inhaler and inhale daily. 08/24/13  Elby Showers, MD  zinc oxide (BALMEX) 11.3 % CREA cream Apply 1 application topically 2 (two) times daily.    Historical Provider, MD     ROS: As above in the HPI. All other systems are stable or negative.  OBJECTIVE: APPEARANCE:  Patient in no acute distress.The patient appeared well nourished and normally developed. Acyanotic. Waist: VITAL SIGNS:BP 113/64  Pulse 63  Temp(Src) 97.4 F (36.3 C) (Oral)  Ht 5' 10.5" (1.791 m)  Wt 175 lb 3.2 oz (79.47 kg)  BMI 24.77 kg/m2  SpO2 97%  WM NAD looks well  SKIN: warm and  Dry without overt rashes, tattoos and scars  HEAD and Neck: without JVD, Head and scalp: normal Eyes:No scleral icterus. Fundi normal, eye movements normal. Ears: Auricle normal, canal normal, Tympanic membranes normal, insufflation normal. Nose: normal Throat: normal Neck & thyroid: normal  CHEST & LUNGS: Chest wall: normal Lungs: Clear  CVS: Reveals the PMI to be normally located. Regular rhythm, First and Second Heart sounds are normal,  absence of murmurs, rubs or gallops. Peripheral vasculature: Radial pulses: normal Dorsal pedis pulses: normal Posterior pulses: normal  ABDOMEN:  Appearance: normal Benign, no organomegaly, no masses, no Abdominal Aortic enlargement. No Guarding , no rebound. No Bruits. Bowel sounds: normal  RECTAL: N/A GU: N/A  EXTREMETIES: nonedematous.  MUSCULOSKELETAL:  Spine: normal Joints: intact  NEUROLOGIC: oriented to time,place and person; nonfocal. Strength is normal Sensory is normal Reflexes are normal Cranial Nerves are normal.  ASSESSMENT: Atrial fibrillation with RVR  Anticoagulation management encounter - Plan: POCT CBC  AAA  Carotid artery stenosis, unspecified  laterality  CHRONIC KIDNEY DISEASE UNSPECIFIED  Essential hypertension, benign  History of prostate cancer  HYPERLIPIDEMIA  PSVT  Tobacco user  PLAN: Make appointment with cardiology for follow up  Keep the follow up with me in 2 months. Continue with xarelto.  Orders Placed This Encounter  Procedures  . POCT CBC   Meds ordered this encounter  Medications  . rivaroxaban (XARELTO) 10 MG TABS tablet    Sig: Take 10 mg by mouth daily.   There are no discontinued medications. Return in about 2 months (around 11/06/2013), or keep appointment., for Recheck medical problems.  Aanvi Voyles P. Modesto Charon, M.D.

## 2013-09-08 DIAGNOSIS — H16229 Keratoconjunctivitis sicca, not specified as Sjogren's, unspecified eye: Secondary | ICD-10-CM | POA: Diagnosis not present

## 2013-09-09 DIAGNOSIS — H40039 Anatomical narrow angle, unspecified eye: Secondary | ICD-10-CM | POA: Diagnosis not present

## 2013-09-09 DIAGNOSIS — H5789 Other specified disorders of eye and adnexa: Secondary | ICD-10-CM | POA: Diagnosis not present

## 2013-09-19 ENCOUNTER — Ambulatory Visit (INDEPENDENT_AMBULATORY_CARE_PROVIDER_SITE_OTHER): Payer: Medicare Other | Admitting: Cardiology

## 2013-09-19 ENCOUNTER — Encounter: Payer: Self-pay | Admitting: Cardiology

## 2013-09-19 VITALS — BP 104/70 | HR 76 | Ht 71.0 in | Wt 181.0 lb

## 2013-09-19 DIAGNOSIS — I4891 Unspecified atrial fibrillation: Secondary | ICD-10-CM

## 2013-09-19 DIAGNOSIS — F17200 Nicotine dependence, unspecified, uncomplicated: Secondary | ICD-10-CM

## 2013-09-19 DIAGNOSIS — I471 Supraventricular tachycardia: Secondary | ICD-10-CM

## 2013-09-19 DIAGNOSIS — N183 Chronic kidney disease, stage 3 unspecified: Secondary | ICD-10-CM

## 2013-09-19 DIAGNOSIS — Z720 Tobacco use: Secondary | ICD-10-CM

## 2013-09-19 MED ORDER — RIVAROXABAN 15 MG PO TABS
15.0000 mg | ORAL_TABLET | Freq: Every day | ORAL | Status: DC
Start: 2013-09-19 — End: 2014-03-02

## 2013-09-19 NOTE — Assessment & Plan Note (Signed)
Newly diagnosed, stable at this time in sinus rhythm on examination. I agree with initiation of anticoagulant for stroke risk reduction as detailed above. Refill given for Xarelto 15 mg daily. Followup BMET and CBC. Office visit in 4 months.

## 2013-09-19 NOTE — Assessment & Plan Note (Signed)
Patient quit smoking in December 2014.

## 2013-09-19 NOTE — Assessment & Plan Note (Signed)
Creatinine 1.4 in December 2014.

## 2013-09-19 NOTE — Assessment & Plan Note (Signed)
Prior history, no recent recurrences.

## 2013-09-19 NOTE — Patient Instructions (Signed)
Your physician recommends that you schedule a follow-up appointment in: 4 months. You will receive a reminder letter in the mail in about 2 months reminding you to call and schedule your appointment. If you don't receive this letter, please contact our office. Your physician recommends that you continue on your current medications as directed. Please refer to the Current Medication list given to you today. Your physician recommends that you have lab work today to check your BMET and CBC.

## 2013-09-19 NOTE — Progress Notes (Signed)
Clinical Summary Erik Mullins is an 80 y.o.male presenting for a post hospital followup. I last saw him in clinic in April 2014 at which time he was stable. In December 2014 he was admitted to the hospital with acute bronchitis, at that time and an episode of rapid atrial fibrillation that spontaneously converted to sinus rhythm with rate control. He was seen by our cardiology service initiated on Xarelto for stroke prophylaxis with CHADSVASC score 6 , otherwise metoprolol for rate control.  Echocardiogram from December 2014 demonstrated LVEF 60-65%, no major valvular abnormalities. Lab work at that time showed potassium 4.8, BUN 23, creatinine 1.4, hemoglobin 16.4, platelets 145.  He is here with his wife today, states that he has been doing well. No palpitations, has gotten over the bronchitis. He reports no bleeding problems on Xarelto.   Allergies  Allergen Reactions  . Morphine Other (See Comments)    hallucinate    Current Outpatient Prescriptions  Medication Sig Dispense Refill  . allopurinol (ZYLOPRIM) 100 MG tablet Take 1 tablet by mouth  every day  30 tablet  3  . Calcium Carbonate-Vitamin D (CALTRATE 600+D) 600-400 MG-UNIT per tablet Take 1 tablet by mouth daily.        Marland Kitchen ketoconazole (NIZORAL) 2 % cream Apply 1 application topically 2 (two) times daily.       Marland Kitchen lisinopril (PRINIVIL,ZESTRIL) 10 MG tablet TAKE 1 TABLET BY MOUTH EVERY DAY  30 tablet  3  . metoprolol tartrate (LOPRESSOR) 25 MG tablet Take 1 tablet by mouth two  times daily  60 tablet  3  . mometasone-formoterol (DULERA) 100-5 MCG/ACT AERO Inhale 2 puffs into the lungs 2 (two) times daily.  1 Inhaler  1  . RESTASIS 0.05 % ophthalmic emulsion Place 1 drop into both eyes 2 (two) times daily.       . Rivaroxaban (XARELTO) 15 MG TABS tablet Take 1 tablet (15 mg total) by mouth daily with supper.  30 tablet  4  . simvastatin (ZOCOR) 20 MG tablet Take 1 tablet by mouth at  bedtime  30 tablet  3  . tiotropium (SPIRIVA) 18  MCG inhalation capsule Place 1 capsule (18 mcg total) into inhaler and inhale daily.  30 capsule  12  . zinc oxide (BALMEX) 11.3 % CREA cream Apply 1 application topically 2 (two) times daily.       No current facility-administered medications for this visit.    Past Medical History  Diagnosis Date  . Essential hypertension, benign   . Hyperlipidemia   . Anemia   . History of gout   . AAA (abdominal aortic aneurysm)     Remote ~1994   . Cirrhosis   . PAD (peripheral artery disease)     Stenting in lower extremities (no records)  . 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 Atrial fibrillation     Diagnosed December 2014  . Prostate cancer     S/P radiation; in remission  . Diabetes mellitus without complication     "I'm hyperglycemic"  . History of blood transfusion     "had 4 units put in when I was bleeding in my colon; none since"  . History of hepatitis     "don't know what kind; stayed 2 months in the hospital in [place] w/it"  . History of stroke   . Arthritis   . CKD (chronic kidney disease) stage 3, GFR 30-59 ml/min     Social History Erik Mullins  reports that he has been smoking Cigarettes.  He has a 35 pack-year smoking history. He has quit using smokeless tobacco. His smokeless tobacco use included Snuff and Chew. Erik Mullins reports that he drinks alcohol.  Review of Systems Hard of hearing, uses hearing aids. Negative except as outlined.  Physical Examination Filed Vitals:   09/19/13 0913  BP: 104/70  Pulse: 76   Filed Weights   09/19/13 0913  Weight: 181 lb (82.101 kg)    Appears comfortable at rest. HEENT: Conjunctiva and lids normal, oropharynx clear.  Neck: Supple, no elevated JVP or loud carotid bruits.  Lungs: Clear to auscultation, nonlabored.  Cardiac: Regular rate and rhythm, no S3.  Abdomen: Soft, nontender, bowel sounds present.  Skin: Scattered tattoos, no ulcerations.  Extremities: No pitting edema, distal pulses 1-2+.     Problem List and Plan   Atrial fibrillation Newly diagnosed, stable at this time in sinus rhythm on examination. I agree with initiation of anticoagulant for stroke risk reduction as detailed above. Refill given for Xarelto 15 mg daily. Followup BMET and CBC. Office visit in 4 months.  PSVT Prior history, no recent recurrences.  Tobacco user Patient quit smoking in December 2014.  CKD (chronic kidney disease) stage 3, GFR 30-59 ml/min Creatinine 1.4 in December 2014.    Satira Sark, M.D., F.A.C.C.

## 2013-09-20 ENCOUNTER — Telehealth: Payer: Self-pay | Admitting: Cardiology

## 2013-09-20 NOTE — Progress Notes (Signed)
Quick Note:  Call Patient Labs that are abnormal: Potassium was elevated at the cardiologist office.   Recommendations: Will need to recheck ina Couple of weeks. Appointment to see Dr Jacelyn Grip in 2 weeks.   ______

## 2013-09-20 NOTE — Telephone Encounter (Signed)
Wife called about status of Prior Auth. Nurse informed wife that pending samples would be left up front and prior auth would be requested. Wife said patient had 2 pills left. Patient will be notified if prior auth approved before running out of xarelto.

## 2013-09-20 NOTE — Telephone Encounter (Signed)
Received fax refill request  Rx # [id number] Medication:  Xarelto 15 mg tab Qty 30 Sig:  Take one tablet by mouth once daily Physician:  Domenic Polite  Prior Authorization Required / Call [phone]

## 2013-09-21 ENCOUNTER — Telehealth: Payer: Self-pay | Admitting: *Deleted

## 2013-09-21 NOTE — Telephone Encounter (Signed)
Message copied by Merlene Laughter on Thu Sep 21, 2013  9:06 AM ------      Message from: MCDOWELL, Aloha Gell      Created: Wed Sep 20, 2013 10:50 AM       Reviewed. Renal function is stable with creatinine 1.5, potassium is 5.9. Hemoglobin normal at 14.0, platelets low but stable at 126. Would continue current dose of Xarelto. Forward results to patient's primary care provider Dr. Jacelyn Grip. May need to consider reducing or even illuminating ACE inhibitor if his hyperkalemia persists - this appears to be a recurring issue. He is not on any KCL supplements. ------

## 2013-09-21 NOTE — Telephone Encounter (Signed)
Patient's wife informed

## 2013-09-21 NOTE — Telephone Encounter (Signed)
Wife informed that approval came for xarelto from patient's insurance and [facility] faxed. No need for samples at this time.

## 2013-10-24 ENCOUNTER — Ambulatory Visit (INDEPENDENT_AMBULATORY_CARE_PROVIDER_SITE_OTHER): Payer: Medicare Other | Admitting: Family Medicine

## 2013-10-24 ENCOUNTER — Encounter: Payer: Self-pay | Admitting: Family Medicine

## 2013-10-24 VITALS — BP 124/75 | HR 90 | Temp 97.6°F | Ht 70.5 in | Wt 182.0 lb

## 2013-10-24 DIAGNOSIS — I4891 Unspecified atrial fibrillation: Secondary | ICD-10-CM

## 2013-10-24 DIAGNOSIS — E785 Hyperlipidemia, unspecified: Secondary | ICD-10-CM

## 2013-10-24 DIAGNOSIS — N183 Chronic kidney disease, stage 3 unspecified: Secondary | ICD-10-CM

## 2013-10-24 DIAGNOSIS — I714 Abdominal aortic aneurysm, without rupture, unspecified: Secondary | ICD-10-CM | POA: Diagnosis not present

## 2013-10-24 DIAGNOSIS — I10 Essential (primary) hypertension: Secondary | ICD-10-CM | POA: Diagnosis not present

## 2013-10-24 DIAGNOSIS — F17200 Nicotine dependence, unspecified, uncomplicated: Secondary | ICD-10-CM

## 2013-10-24 DIAGNOSIS — Z720 Tobacco use: Secondary | ICD-10-CM

## 2013-10-24 DIAGNOSIS — M109 Gout, unspecified: Secondary | ICD-10-CM | POA: Insufficient documentation

## 2013-10-24 DIAGNOSIS — I6529 Occlusion and stenosis of unspecified carotid artery: Secondary | ICD-10-CM

## 2013-10-24 DIAGNOSIS — D696 Thrombocytopenia, unspecified: Secondary | ICD-10-CM

## 2013-10-24 LAB — POCT CBC
Granulocyte percent: 77.1 %G (ref 37–80)
HCT, POC: 44.4 % (ref 43.5–53.7)
Hemoglobin: 14.2 g/dL (ref 14.1–18.1)
Lymph, poc: 1.6 (ref 0.6–3.4)
MCH, POC: 28.7 pg (ref 27–31.2)
MCHC: 31.9 g/dL (ref 31.8–35.4)
MCV: 90.2 fL (ref 80–97)
MPV: 8.3 fL (ref 0–99.8)
POC Granulocyte: 6.4 (ref 2–6.9)
POC LYMPH PERCENT: 19.4 %L (ref 10–50)
Platelet Count, POC: 189 10*3/uL (ref 142–424)
RBC: 4.9 M/uL (ref 4.69–6.13)
RDW, POC: 13.5 %
WBC: 8.3 10*3/uL (ref 4.6–10.2)

## 2013-10-24 NOTE — Progress Notes (Signed)
Patient ID: Erik Mullins, male   DOB: 16-Nov-1930, 80 y.o.   MRN: [id number] SUBJECTIVE: CC: Chief Complaint  Patient presents with  . Follow-up    4 month follow up     HPI: Patient is here for follow up of hyperlipidemia/HTN/tobacco user/a fib/hyperkalemia/gout: Has been doing well. Presently only irritating problem is the psoriasis involving the skin of his  Ears. Get itchy and irritating.  denies Headache;denies Chest Pain;denies weakness;denies Shortness of Breath and orthopnea;denies Visual changes;denies palpitations;denies cough;denies pedal edema;denies symptoms of TIA or stroke;deniesClaudication symptoms. admits to Compliance with medications; denies Problems with medications.    Past Medical History  Diagnosis Date  . Essential hypertension, benign   . Hyperlipidemia   . Anemia   . History of gout   . AAA (abdominal aortic aneurysm)     Remote ~1994   . Cirrhosis   . PAD (peripheral artery disease)     Stenting in lower extremities (no records)  . 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 Atrial fibrillation     Diagnosed December 2014  . Prostate cancer     S/P radiation; in remission  . Diabetes mellitus without complication     "I'm hyperglycemic"  . History of blood transfusion     "had 4 units put in when I was bleeding in my colon; none since"  . History of hepatitis     "don't know what kind; stayed 2 months in the hospital in [place] w/it"  . History of stroke   . Arthritis   . CKD (chronic kidney disease) stage 3, GFR 30-59 ml/min   . Gout    Past Surgical History  Procedure Laterality Date  . Abdominal aortic aneurysm repair  1980's    in [place]  . Partial colectomy      "I was bleeding to death inside; took out 2/3 of my colon" (08/22/2013)  . Cholecystectomy    . Colon surgery    . Appendectomy    . Cataract extraction w/ intraocular lens  implant, bilateral Bilateral 2014  . Carotid endarterectomy Right ~ 1999    History   Social History  . Marital Status: Married    Spouse Name: N/A    Number of Children: N/A  . Years of Education: N/A   Occupational History  . Not on file.   Social History Main Topics  . Smoking status: Current Every Day Smoker -- 0.50 packs/day for 70 years    Types: Cigarettes  . Smokeless tobacco: Former Systems developer    Types: Snuff, Chew  . Alcohol Use: Yes     Comment: 08/22/2013 "drink ~ 1 beer/yr"  . Drug Use: No  . Sexual Activity: No   Other Topics Concern  . Not on file   Social History Narrative   From [place]. Relocated to this area from [facility].   Family History  Problem Relation Age of Onset  . Cancer Mother     uterine deceased age 42  . Arrhythmia Other     Atrial fibrillation  . Hypertension Maternal Grandmother    Current Outpatient Prescriptions on File Prior to Visit  Medication Sig Dispense Refill  . allopurinol (ZYLOPRIM) 100 MG tablet Take 1 tablet by mouth  every day  30 tablet  3  . Calcium Carbonate-Vitamin D (CALTRATE 600+D) 600-400 MG-UNIT per tablet Take 1 tablet by mouth daily.        Marland Kitchen ketoconazole (NIZORAL) 2 % cream Apply 1 application topically  2 (two) times daily.       Marland Kitchen lisinopril (PRINIVIL,ZESTRIL) 10 MG tablet TAKE 1 TABLET BY MOUTH EVERY DAY  30 tablet  3  . metoprolol tartrate (LOPRESSOR) 25 MG tablet Take 1 tablet by mouth two  times daily  60 tablet  3  . RESTASIS 0.05 % ophthalmic emulsion Place 1 drop into both eyes 2 (two) times daily.       . Rivaroxaban (XARELTO) 15 MG TABS tablet Take 1 tablet (15 mg total) by mouth daily with supper.  30 tablet  4  . simvastatin (ZOCOR) 20 MG tablet Take 1 tablet by mouth at  bedtime  30 tablet  3  . zinc oxide (BALMEX) 11.3 % CREA cream Apply 1 application topically 2 (two) times daily.      . mometasone-formoterol (DULERA) 100-5 MCG/ACT AERO Inhale 2 puffs into the lungs 2 (two) times daily.  1 Inhaler  1  . tiotropium (SPIRIVA) 18 MCG inhalation capsule Place 1 capsule  (18 mcg total) into inhaler and inhale daily.  30 capsule  12   No current facility-administered medications on file prior to visit.   Allergies  Allergen Reactions  . Morphine Other (See Comments)    hallucinate   Immunization History  Administered Date(s) Administered  . Tdap 02/16/2013   Prior to Admission medications   Medication Sig Start Date End Date Taking? Authorizing Provider  allopurinol (ZYLOPRIM) 100 MG tablet Take 1 tablet by mouth  every day 08/21/13  Yes Vernie Shanks, MD  Calcium Carbonate-Vitamin D (CALTRATE 600+D) 600-400 MG-UNIT per tablet Take 1 tablet by mouth daily.     Yes Historical Provider, MD  ketoconazole (NIZORAL) 2 % cream Apply 1 application topically 2 (two) times daily.  11/08/12  Yes Historical Provider, MD  lisinopril (PRINIVIL,ZESTRIL) 10 MG tablet TAKE 1 TABLET BY MOUTH EVERY DAY 08/03/13  Yes Vernie Shanks, MD  metoprolol tartrate (LOPRESSOR) 25 MG tablet Take 1 tablet by mouth two  times daily 08/21/13  Yes Vernie Shanks, MD  RESTASIS 0.05 % ophthalmic emulsion Place 1 drop into both eyes 2 (two) times daily.  09/09/13  Yes Historical Provider, MD  Rivaroxaban (XARELTO) 15 MG TABS tablet Take 1 tablet (15 mg total) by mouth daily with supper. 09/19/13  Yes Satira Sark, MD  simvastatin (ZOCOR) 20 MG tablet Take 1 tablet by mouth at  bedtime 08/21/13  Yes Vernie Shanks, MD  zinc oxide (BALMEX) 11.3 % CREA cream Apply 1 application topically 2 (two) times daily.   Yes Historical Provider, MD  mometasone-formoterol (DULERA) 100-5 MCG/ACT AERO Inhale 2 puffs into the lungs 2 (two) times daily. 08/24/13   Myrtis Ser, MD  tiotropium (SPIRIVA) 18 MCG inhalation capsule Place 1 capsule (18 mcg total) into inhaler and inhale daily. 08/24/13   Myrtis Ser, MD     ROS: As above in the HPI. All other systems are stable or negative.  OBJECTIVE: APPEARANCE:  Patient in no acute distress.The patient appeared well nourished and normally  developed. Acyanotic. Waist: VITAL SIGNS:BP 124/75  Pulse 90  Temp(Src) 97.6 F (36.4 C) (Oral)  Ht 5' 10.5" (1.791 m)  Wt 182 lb (82.555 kg)  BMI 25.74 kg/m2  WM  SKIN: warm and  Dry without overt rashes, tattoos and scars  HEAD and Neck: without JVD, Head and scalp: normal Eyes:No scleral icterus. Fundi normal, eye movements normal. Ears: Auricle flakey scales., canal flakiness at the orifice., Tympanic membranes normal, insufflation normal. Nose: normal Throat:  normal Neck: supple thyroid: normal  CHEST & LUNGS: Chest wall: normal Lungs: Clear  CVS: Reveals the PMI to be normally located. Rhythm,: a fib with controlled rate. First and Second Heart sounds are normal,  absence of murmurs, rubs or gallops. Peripheral vasculature: Radial pulses: palpable  ABDOMEN:  Appearance: normal Benign, no organomegaly, no masses, no Abdominal Aortic enlargement. No Guarding , no rebound. No Bruits. Bowel sounds: normal  RECTAL: N/A GU: N/A  EXTREMETIES: nonedematous.  NEUROLOGIC: oriented to time,place and person; nonfocal. Results for orders placed in visit on 09/05/13  POCT CBC      Result Value Range   WBC 9.7  4.6 - 10.2 K/uL   Lymph, poc 2.2  0.6 - 3.4   POC LYMPH PERCENT 22.9  10 - 50 %L   POC Granulocyte 6.7  2 - 6.9   Granulocyte percent 69.2  37 - 80 %G   RBC 4.9  4.69 - 6.13 M/uL   Hemoglobin 14.0 (*) 14.1 - 18.1 g/dL   HCT, POC 44.0  43.5 - 53.7 %   MCV 90.8  80 - 97 fL   MCH, POC 28.8  27 - 31.2 pg   MCHC 31.7 (*) 31.8 - 35.4 g/dL   RDW, POC 13.7     Platelet Count, POC 235.0  142 - 424 K/uL   MPV 8.5  0 - 99.8 fL    ASSESSMENT:  Carotid artery stenosis  Atrial fibrillation  AAA  Tobacco user  HYPERLIPIDEMIA - Plan: CMP14+EGFR, NMR, lipoprofile  CKD (chronic kidney disease) stage 3, GFR 30-59 ml/min - Plan: POCT CBC  Essential hypertension, benign - Plan: CMP14+EGFR  Gout - Plan: Uric acid  Thrombocytopenia,  unspecified  PLAN: coenzyme q 10 200 mg daily. Continue same  Medications. Patient has no intentions of stopping smoking and gets upset whenever the issue is raised.  Orders Placed This Encounter  Procedures  . CMP14+EGFR  . NMR, lipoprofile  . Uric acid  . POCT CBC  reviewed labs.  reviewed cardiology notes. Same medications and regimen for now. No orders of the defined types were placed in this encounter.   There are no discontinued medications. Return in about 2 months (around 12/22/2013) for Recheck medical problems.  Arrow Emmerich P. Jacelyn Grip, M.D.

## 2013-10-24 NOTE — Patient Instructions (Signed)
coenzyme q 10 200 mg daily.

## 2013-10-26 LAB — CMP14+EGFR
ALT: 11 IU/L (ref 0–44)
AST: 16 IU/L (ref 0–40)
Albumin/Globulin Ratio: 1.8 (ref 1.1–2.5)
Albumin: 4.5 g/dL (ref 3.5–4.7)
Alkaline Phosphatase: 97 IU/L (ref 39–117)
BUN/Creatinine Ratio: 16 (ref 10–22)
BUN: 25 mg/dL (ref 8–27)
CO2: 26 mmol/L (ref 18–29)
Calcium: 9.4 mg/dL (ref 8.6–10.2)
Chloride: 103 mmol/L (ref 97–108)
Creatinine, Ser: 1.61 mg/dL — ABNORMAL HIGH (ref 0.76–1.27)
GFR calc Af Amer: 46 mL/min/{1.73_m2} — ABNORMAL LOW (ref >59)
GFR calc non Af Amer: 40 mL/min/{1.73_m2} — ABNORMAL LOW (ref >59)
Globulin, Total: 2.5 g/dL (ref 1.5–4.5)
Glucose: 109 mg/dL — ABNORMAL HIGH (ref 65–99)
Potassium: 5.3 mmol/L — ABNORMAL HIGH (ref 3.5–5.2)
Sodium: 142 mmol/L (ref 134–144)
Total Bilirubin: 0.3 mg/dL (ref 0.0–1.2)
Total Protein: 7 g/dL (ref 6.0–8.5)

## 2013-10-26 LAB — NMR, LIPOPROFILE
Cholesterol: 147 mg/dL (ref <200)
HDL Cholesterol by NMR: 44 mg/dL (ref >=40)
HDL Particle Number: 29.8 umol/L — ABNORMAL LOW (ref >=30.5)
LDL Particle Number: 1071 nmol/L — ABNORMAL HIGH (ref <1000)
LDL Size: 20.5 nm — ABNORMAL LOW (ref >20.5)
LDLC SERPL CALC-MCNC: 69 mg/dL (ref <100)
LP-IR Score: 59 — ABNORMAL HIGH (ref <=45)
Small LDL Particle Number: 494 nmol/L (ref <=527)
Triglycerides by NMR: 168 mg/dL — ABNORMAL HIGH (ref <150)

## 2013-10-26 LAB — URIC ACID: Uric Acid: 6.9 mg/dL (ref 3.7–8.6)

## 2013-10-31 ENCOUNTER — Telehealth: Payer: Self-pay | Admitting: *Deleted

## 2013-10-31 NOTE — Telephone Encounter (Signed)
Message copied by Marin Olp on Tue Oct 31, 2013  4:40 PM ------      Message from: Vernie Shanks      Created: Sun Oct 29, 2013  6:00 PM       Call Patient      Lab result at or close to goal, or stable      No change in Medications for now.      No Change in recommendations.      No change in plans for follow up. ------

## 2013-10-31 NOTE — Telephone Encounter (Signed)
Pt's wife notified of results Verbalizes understanding 

## 2013-12-04 ENCOUNTER — Other Ambulatory Visit: Payer: Self-pay | Admitting: Family Medicine

## 2013-12-26 ENCOUNTER — Ambulatory Visit (INDEPENDENT_AMBULATORY_CARE_PROVIDER_SITE_OTHER): Payer: Medicare Other | Admitting: Family Medicine

## 2013-12-26 ENCOUNTER — Encounter: Payer: Self-pay | Admitting: Family Medicine

## 2013-12-26 VITALS — BP 131/79 | HR 65 | Temp 97.1°F | Ht 70.5 in | Wt 186.4 lb

## 2013-12-26 DIAGNOSIS — I10 Essential (primary) hypertension: Secondary | ICD-10-CM

## 2013-12-26 DIAGNOSIS — I4891 Unspecified atrial fibrillation: Secondary | ICD-10-CM

## 2013-12-26 DIAGNOSIS — E785 Hyperlipidemia, unspecified: Secondary | ICD-10-CM

## 2013-12-26 DIAGNOSIS — D696 Thrombocytopenia, unspecified: Secondary | ICD-10-CM

## 2013-12-26 DIAGNOSIS — I714 Abdominal aortic aneurysm, without rupture, unspecified: Secondary | ICD-10-CM

## 2013-12-26 DIAGNOSIS — N183 Chronic kidney disease, stage 3 unspecified: Secondary | ICD-10-CM | POA: Diagnosis not present

## 2013-12-26 DIAGNOSIS — Z7901 Long term (current) use of anticoagulants: Secondary | ICD-10-CM

## 2013-12-26 DIAGNOSIS — Z5181 Encounter for therapeutic drug level monitoring: Secondary | ICD-10-CM

## 2013-12-26 DIAGNOSIS — I6529 Occlusion and stenosis of unspecified carotid artery: Secondary | ICD-10-CM

## 2013-12-26 DIAGNOSIS — Z720 Tobacco use: Secondary | ICD-10-CM

## 2013-12-26 DIAGNOSIS — F17200 Nicotine dependence, unspecified, uncomplicated: Secondary | ICD-10-CM

## 2013-12-26 MED ORDER — SIMVASTATIN 20 MG PO TABS: ORAL_TABLET | ORAL | Status: DC

## 2013-12-26 MED ORDER — ALLOPURINOL 100 MG PO TABS: ORAL_TABLET | ORAL | Status: DC

## 2013-12-26 MED ORDER — LISINOPRIL 10 MG PO TABS: ORAL_TABLET | ORAL | Status: DC

## 2013-12-26 NOTE — Progress Notes (Signed)
Patient ID: Erik Mullins, male   DOB: 01/31/1931, 80 y.o.   MRN: [id number] SUBJECTIVE: CC: Chief Complaint  Patient presents with  . Follow-up    2 MONTH FOLLOW UP     HPI:  Patient is here for follow up of hyperlipidemia/HTN/tob smoker: denies Headache;denies Chest Pain;denies weakness;denies Shortness of Breath and orthopnea;denies Visual changes;denies palpitations;denies cough;denies pedal edema;denies symptoms of TIA or stroke;deniesClaudication symptoms. admits to Compliance with medications; denies Problems with medications.  He has stopped smoking now.  Doing well. He is gaining weight from eating.   Past Medical History  Diagnosis Date  . Essential hypertension, benign   . Hyperlipidemia   . Anemia   . History of gout   . AAA (abdominal aortic aneurysm)     Remote ~1994   . Cirrhosis   . PAD (peripheral artery disease)     Stenting in lower extremities (no records)  . 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 Atrial fibrillation     Diagnosed December 2014  . Prostate cancer     S/P radiation; in remission  . Diabetes mellitus without complication     "I'm hyperglycemic"  . History of blood transfusion     "had 4 units put in when I was bleeding in my colon; none since"  . History of hepatitis     "don't know what kind; stayed 2 months in the hospital in [place] w/it"  . History of stroke   . Arthritis   . CKD (chronic kidney disease) stage 3, GFR 30-59 ml/min   . Gout    Past Surgical History  Procedure Laterality Date  . Abdominal aortic aneurysm repair  1980's    in [place]  . Partial colectomy      "I was bleeding to death inside; took out 2/3 of my colon" (08/22/2013)  . Cholecystectomy    . Colon surgery    . Appendectomy    . Cataract extraction w/ intraocular lens  implant, bilateral Bilateral 2014  . Carotid endarterectomy Right ~ 1999   History   Social History  . Marital Status: Married    Spouse Name: N/A     Number of Children: N/A  . Years of Education: N/A   Occupational History  . Not on file.   Social History Main Topics  . Smoking status: Former Smoker -- 0.50 packs/day for 70 years    Types: Cigarettes  . Smokeless tobacco: Former Systems developer    Types: Snuff, Chew  . Alcohol Use: Yes     Comment: 08/22/2013 "drink ~ 1 beer/yr"  . Drug Use: No  . Sexual Activity: No   Other Topics Concern  . Not on file   Social History Narrative   From [place]. Relocated to this area from [facility].   Family History  Problem Relation Age of Onset  . Cancer Mother     uterine deceased age 34  . Arrhythmia Other     Atrial fibrillation  . Hypertension Maternal Grandmother    Current Outpatient Prescriptions on File Prior to Visit  Medication Sig Dispense Refill  . Calcium Carbonate-Vitamin D (CALTRATE 600+D) 600-400 MG-UNIT per tablet Take 1 tablet by mouth daily.        Marland Kitchen ketoconazole (NIZORAL) 2 % cream Apply 1 application topically 2 (two) times daily.       . metoprolol tartrate (LOPRESSOR) 25 MG tablet Take 1 tablet by mouth two  times daily  60 tablet  3  .  mometasone-formoterol (DULERA) 100-5 MCG/ACT AERO Inhale 2 puffs into the lungs 2 (two) times daily.  1 Inhaler  1  . RESTASIS 0.05 % ophthalmic emulsion Place 1 drop into both eyes 2 (two) times daily.       . Rivaroxaban (XARELTO) 15 MG TABS tablet Take 1 tablet (15 mg total) by mouth daily with supper.  30 tablet  4  . zinc oxide (BALMEX) 11.3 % CREA cream Apply 1 application topically 2 (two) times daily.      Marland Kitchen tiotropium (SPIRIVA) 18 MCG inhalation capsule Place 1 capsule (18 mcg total) into inhaler and inhale daily.  30 capsule  12   No current facility-administered medications on file prior to visit.   Allergies  Allergen Reactions  . Morphine Other (See Comments)    hallucinate   Immunization History  Administered Date(s) Administered  . Tdap 02/16/2013   Prior to Admission medications   Medication Sig Start Date  End Date Taking? Authorizing Provider  allopurinol (ZYLOPRIM) 100 MG tablet Take 1 tablet by mouth  every day 08/21/13  Yes Vernie Shanks, MD  Calcium Carbonate-Vitamin D (CALTRATE 600+D) 600-400 MG-UNIT per tablet Take 1 tablet by mouth daily.     Yes Historical Provider, MD  ketoconazole (NIZORAL) 2 % cream Apply 1 application topically 2 (two) times daily.  11/08/12  Yes Historical Provider, MD  lisinopril (PRINIVIL,ZESTRIL) 10 MG tablet TAKE 1 TABLET BY MOUTH EVERY DAY   Yes Vernie Shanks, MD  metoprolol tartrate (LOPRESSOR) 25 MG tablet Take 1 tablet by mouth two  times daily 08/21/13  Yes Vernie Shanks, MD  mometasone-formoterol (DULERA) 100-5 MCG/ACT AERO Inhale 2 puffs into the lungs 2 (two) times daily. 08/24/13  Yes Myrtis Ser, MD  RESTASIS 0.05 % ophthalmic emulsion Place 1 drop into both eyes 2 (two) times daily.  09/09/13  Yes Historical Provider, MD  Rivaroxaban (XARELTO) 15 MG TABS tablet Take 1 tablet (15 mg total) by mouth daily with supper. 09/19/13  Yes Satira Sark, MD  simvastatin (ZOCOR) 20 MG tablet Take 1 tablet by mouth at  bedtime 08/21/13  Yes Vernie Shanks, MD  zinc oxide (BALMEX) 11.3 % CREA cream Apply 1 application topically 2 (two) times daily.   Yes Historical Provider, MD  tiotropium (SPIRIVA) 18 MCG inhalation capsule Place 1 capsule (18 mcg total) into inhaler and inhale daily. 08/24/13   Myrtis Ser, MD     ROS: As above in the HPI. All other systems are stable or negative.  OBJECTIVE: APPEARANCE:  Patient in no acute distress.The patient appeared well nourished and normally developed. Acyanotic. Waist: VITAL SIGNS:BP 131/79  Pulse 65  Temp(Src) 97.1 F (36.2 C) (Oral)  Ht 5' 10.5" (1.791 m)  Wt 186 lb 6.4 oz (84.55 kg)  BMI 26.36 kg/m2  WM Overweight  SKIN: warm and  Dry without overt rashes, tattoos and scars  HEAD and Neck: without JVD, Head and scalp: normal Eyes:No scleral icterus. Fundi normal, eye movements normal. Ears:  Auricle normal, canal normal, Tympanic membranes normal, insufflation normal. Nose: normal Throat: normal Neck & thyroid: normal  CHEST & LUNGS: Chest wall: normal Lungs: Clear  CVS: Reveals the PMI to be normally located. Regular rhythm, First and Second Heart sounds are normal,  absence of murmurs, rubs or gallops. Peripheral vasculature: Radial pulses: normal Dorsal pedis pulses: normal Posterior pulses: normal  ABDOMEN:  Appearance: normal Benign, no organomegaly, no masses, no Abdominal Aortic enlargement. No Guarding , no rebound. No Bruits. Bowel  sounds: normal  RECTAL: N/A GU: N/A  EXTREMETIES: nonedematous.  MUSCULOSKELETAL:  Spine: normal Joints: intact  NEUROLOGIC: oriented to time,place and person; nonfocal. Strength is normal Sensory is normal Reflexes are normal Cranial Nerves are normal. Results for orders placed in visit on 10/24/13  CMP14+EGFR      Result Value Ref Range   Glucose 109 (*) 65 - 99 mg/dL   BUN 25  8 - 27 mg/dL   Creatinine, Ser 1.61 (*) 0.76 - 1.27 mg/dL   GFR calc non Af Amer 40 (*) >59 mL/min/1.73   GFR calc Af Amer 46 (*) >59 mL/min/1.73   BUN/Creatinine Ratio 16  10 - 22   Sodium 142  134 - 144 mmol/L   Potassium 5.3 (*) 3.5 - 5.2 mmol/L   Chloride 103  97 - 108 mmol/L   CO2 26  18 - 29 mmol/L   Calcium 9.4  8.6 - 10.2 mg/dL   Total Protein 7.0  6.0 - 8.5 g/dL   Albumin 4.5  3.5 - 4.7 g/dL   Globulin, Total 2.5  1.5 - 4.5 g/dL   Albumin/Globulin Ratio 1.8  1.1 - 2.5   Total Bilirubin 0.3  0.0 - 1.2 mg/dL   Alkaline Phosphatase 97  39 - 117 IU/L   AST 16  0 - 40 IU/L   ALT 11  0 - 44 IU/L  NMR, LIPOPROFILE      Result Value Ref Range   LDL Particle Number [id number] (*) <1000 nmol/L   LDLC SERPL CALC-MCNC 69  <100 mg/dL   HDL Cholesterol by NMR 44  >=40 mg/dL   Triglycerides by NMR 168 (*) <150 mg/dL   Cholesterol 147  <200 mg/dL   HDL Particle Number 29.8 (*) >=30.5 umol/L   Small LDL Particle Number 494  <=527 nmol/L    LDL Size 20.5 (*) >20.5 nm   LP-IR Score 59 (*) <=45  URIC ACID      Result Value Ref Range   Uric Acid 6.9  3.7 - 8.6 mg/dL  POCT CBC      Result Value Ref Range   WBC 8.3  4.6 - 10.2 K/uL   Lymph, poc 1.6  0.6 - 3.4   POC LYMPH PERCENT 19.4  10 - 50 %L   POC Granulocyte 6.4  2 - 6.9   Granulocyte percent 77.1  37 - 80 %G   RBC 4.9  4.69 - 6.13 M/uL   Hemoglobin 14.2  14.1 - 18.1 g/dL   HCT, POC 44.4  43.5 - 53.7 %   MCV 90.2  80 - 97 fL   MCH, POC 28.7  27 - 31.2 pg   MCHC 31.9  31.8 - 35.4 g/dL   RDW, POC 13.5     Platelet Count, POC 189.0  142 - 424 K/uL   MPV 8.3  0 - 99.8 fL    ASSESSMENT:  HYPERLIPIDEMIA - Plan: simvastatin (ZOCOR) 20 MG tablet, DISCONTINUED: simvastatin (ZOCOR) 20 MG tablet  Essential hypertension, benign - Plan: lisinopril (PRINIVIL,ZESTRIL) 10 MG tablet, DISCONTINUED: lisinopril (PRINIVIL,ZESTRIL) 10 MG tablet  CKD (chronic kidney disease) stage 3, GFR 30-59 ml/min - Plan: BMP8+EGFR  Carotid artery stenosis  Atrial fibrillation  Anticoagulation management encounter  AAA  Tobacco user  Thrombocytopenia, unspecified Stable   PLAN: Praised patient in regards to his smoking cessation  Orders Placed This Encounter  Procedures  . BMP8+EGFR   Meds ordered this encounter  Medications  . DISCONTD: allopurinol (ZYLOPRIM) 100 MG tablet  Sig: Take 1 tablet by mouth  every day    Dispense:  30 tablet    Refill:  11  . DISCONTD: lisinopril (PRINIVIL,ZESTRIL) 10 MG tablet    Sig: TAKE 1 TABLET BY MOUTH EVERY DAY    Dispense:  30 tablet    Refill:  11  . DISCONTD: simvastatin (ZOCOR) 20 MG tablet    Sig: Take 1 tablet by mouth at  bedtime    Dispense:  30 tablet    Refill:  11  . allopurinol (ZYLOPRIM) 100 MG tablet    Sig: Take 1 tablet by mouth  every day    Dispense:  90 tablet    Refill:  3  . simvastatin (ZOCOR) 20 MG tablet    Sig: Take 1 tablet by mouth at  bedtime    Dispense:  90 tablet    Refill:  3  . lisinopril  (PRINIVIL,ZESTRIL) 10 MG tablet    Sig: TAKE 1 TABLET BY MOUTH EVERY DAY    Dispense:  90 tablet    Refill:  3   Medications Discontinued During This Encounter  Medication Reason  . allopurinol (ZYLOPRIM) 100 MG tablet Reorder  . lisinopril (PRINIVIL,ZESTRIL) 10 MG tablet Reorder  . simvastatin (ZOCOR) 20 MG tablet Reorder  . allopurinol (ZYLOPRIM) 100 MG tablet Reorder  . simvastatin (ZOCOR) 20 MG tablet Reorder  . lisinopril (PRINIVIL,ZESTRIL) 10 MG tablet Reorder   Return in about 3 months (around 03/27/2014) for Recheck medical problems.  Hadli Vandemark P. Jacelyn Grip, M.D.

## 2013-12-27 ENCOUNTER — Telehealth: Payer: Self-pay | Admitting: Family Medicine

## 2013-12-27 LAB — BMP8+EGFR
BUN/Creatinine Ratio: 14 (ref 10–22)
BUN: 22 mg/dL (ref 8–27)
CO2: 27 mmol/L (ref 18–29)
Calcium: 9.6 mg/dL (ref 8.6–10.2)
Chloride: 98 mmol/L (ref 97–108)
Creatinine, Ser: 1.57 mg/dL — ABNORMAL HIGH (ref 0.76–1.27)
GFR calc Af Amer: 47 mL/min/{1.73_m2} — ABNORMAL LOW (ref >59)
GFR calc non Af Amer: 41 mL/min/{1.73_m2} — ABNORMAL LOW (ref >59)
Glucose: 80 mg/dL (ref 65–99)
Potassium: 4.8 mmol/L (ref 3.5–5.2)
Sodium: 142 mmol/L (ref 134–144)

## 2014-01-08 DIAGNOSIS — M549 Dorsalgia, unspecified: Secondary | ICD-10-CM | POA: Diagnosis not present

## 2014-01-17 ENCOUNTER — Telehealth: Payer: Self-pay | Admitting: Family Medicine

## 2014-01-17 NOTE — Telephone Encounter (Signed)
appt given with bill for friday

## 2014-01-19 ENCOUNTER — Encounter: Payer: Self-pay | Admitting: Family Medicine

## 2014-01-19 ENCOUNTER — Ambulatory Visit (INDEPENDENT_AMBULATORY_CARE_PROVIDER_SITE_OTHER): Payer: Medicare Other | Admitting: Family Medicine

## 2014-01-19 VITALS — BP 132/76 | HR 60 | Temp 97.1°F | Ht 70.5 in | Wt 187.4 lb

## 2014-01-19 DIAGNOSIS — I6529 Occlusion and stenosis of unspecified carotid artery: Secondary | ICD-10-CM | POA: Diagnosis not present

## 2014-01-19 DIAGNOSIS — M549 Dorsalgia, unspecified: Secondary | ICD-10-CM | POA: Diagnosis not present

## 2014-01-19 NOTE — Progress Notes (Signed)
   Subjective:    Patient ID: Erik Mullins, male    DOB: 09-Dec-1930, 80 y.o.   MRN: [id number]  HPI This 80 y.o. male presents for evaluation of left thoracic back pain after playing golf and he Just changed his golf swing to a more vigorous aggressive style.   Review of Systems No chest pain, SOB, HA, dizziness, vision change, N/V, diarrhea, constipation, dysuria, urinary urgency or frequency, myalgias, arthralgias or rash.     Objective:   Physical Exam  Vital signs noted  Well developed well nourished male.  HEENT - Head atraumatic Normocephalic Respiratory - Lungs CTA bilateral Cardiac - RRR S1 and S2 without murmur MS - TTP right scapula      Assessment & Plan:  Back pain - Plan: DG Thoracic Spine 2 View Recommend continue with pain meds and reassured this is from his scapular muscles and thoracic paraspinous muscles and no other xrays needed  Lysbeth Penner FNP

## 2014-01-24 ENCOUNTER — Ambulatory Visit (INDEPENDENT_AMBULATORY_CARE_PROVIDER_SITE_OTHER): Payer: Medicare Other | Admitting: Cardiology

## 2014-01-24 ENCOUNTER — Encounter: Payer: Self-pay | Admitting: Cardiology

## 2014-01-24 VITALS — BP 144/92 | HR 66 | Ht 71.0 in | Wt 188.1 lb

## 2014-01-24 DIAGNOSIS — N183 Chronic kidney disease, stage 3 unspecified: Secondary | ICD-10-CM

## 2014-01-24 DIAGNOSIS — I10 Essential (primary) hypertension: Secondary | ICD-10-CM

## 2014-01-24 DIAGNOSIS — I471 Supraventricular tachycardia: Secondary | ICD-10-CM

## 2014-01-24 DIAGNOSIS — I4891 Unspecified atrial fibrillation: Secondary | ICD-10-CM

## 2014-01-24 DIAGNOSIS — I6529 Occlusion and stenosis of unspecified carotid artery: Secondary | ICD-10-CM | POA: Diagnosis not present

## 2014-01-24 NOTE — Assessment & Plan Note (Signed)
No obvious recurrences. 

## 2014-01-24 NOTE — Assessment & Plan Note (Signed)
Blood pressure mildly elevated today. No change to current regimen. Keep follow with primary care.

## 2014-01-24 NOTE — Progress Notes (Signed)
Clinical Summary Mr. Coaxum is an 80 y.o.male last seen in January of this year. He is here with his wife, has been doing well from a cardiac perspective without any significant palpitations, no chest pain. He denies any recurring bleeding problems on Xarelto. Does state that he has hemorrhoids and sometimes sees a little bit of bright red blood when he strains.  Recent lab work in April showed BUN 22, creatinine 1.5, potassium 4.8. Hemoglobin was 14 back in January.  Echocardiogram from December 2014 demonstrated LVEF 60-65%, no major valvular abnormalities.  He tries to stay active, has been playing golf recently.   Allergies  Allergen Reactions  . Morphine Other (See Comments)    hallucinate    Current Outpatient Prescriptions  Medication Sig Dispense Refill  . allopurinol (ZYLOPRIM) 100 MG tablet Take 1 tablet by mouth  every day  90 tablet  3  . Calcium Carbonate-Vitamin D (CALTRATE 600+D) 600-400 MG-UNIT per tablet Take 1 tablet by mouth daily.        Marland Kitchen ketoconazole (NIZORAL) 2 % cream Apply 1 application topically 2 (two) times daily.       Marland Kitchen lisinopril (PRINIVIL,ZESTRIL) 10 MG tablet TAKE 1 TABLET BY MOUTH EVERY DAY  90 tablet  3  . metoprolol tartrate (LOPRESSOR) 25 MG tablet Take 1 tablet by mouth two  times daily  60 tablet  3  . RESTASIS 0.05 % ophthalmic emulsion Place 1 drop into both eyes 2 (two) times daily.       . Rivaroxaban (XARELTO) 15 MG TABS tablet Take 1 tablet (15 mg total) by mouth daily with supper.  30 tablet  4  . simvastatin (ZOCOR) 20 MG tablet Take 1 tablet by mouth at  bedtime  90 tablet  3  . zinc oxide (BALMEX) 11.3 % CREA cream Apply 1 application topically 2 (two) times daily.       No current facility-administered medications for this visit.    Past Medical History  Diagnosis Date  . Essential hypertension, benign   . Hyperlipidemia   . Anemia   . History of gout   . AAA (abdominal aortic aneurysm)     Remote ~1994   . Cirrhosis   .  PAD (peripheral artery disease)     Stenting in lower extremities (no records)  . 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 Atrial fibrillation     Diagnosed December 2014  . Prostate cancer     XRT; in remission  . Type 2 diabetes mellitus   . History of GI bleed   . History of hepatitis   . History of stroke   . Arthritis   . CKD (chronic kidney disease) stage 3, GFR 30-59 ml/min   . Gout     Social History Mr. Stanbery reports that he has quit smoking. His smoking use included Cigarettes. He has a 35 pack-year smoking history. He has quit using smokeless tobacco. His smokeless tobacco use included Snuff and Chew. Mr. Mander reports that he drinks alcohol.  Review of Systems Right musculoskeletal shoulder discomfort. Otherwise negative.  Physical Examination Filed Vitals:   01/24/14 1302  BP: 144/92  Pulse: 66   Filed Weights   01/24/14 1302  Weight: 188 lb 1.9 oz (85.331 kg)    Appears comfortable at rest.  HEENT: Conjunctiva and lids normal, oropharynx clear.  Neck: Supple, no elevated JVP or loud carotid bruits.  Lungs: Clear to auscultation, nonlabored.  Cardiac: Regular rate and  rhythm, no S3.  Abdomen: Soft, nontender, bowel sounds present.  Skin: Scattered tattoos, no ulcerations.  Extremities: No pitting edema, distal pulses 1-2+.    Problem List and Plan   Atrial fibrillation Paroxysmal, maintaining sinus rhythm at this time. Continue Lopressor and Xarelto. Followup 6 months with CBC and BMET.  PSVT No obvious recurrences.  CKD (chronic kidney disease) stage 3, GFR 30-59 ml/min Creatinine stable at 1.5.  Essential hypertension, benign Blood pressure mildly elevated today. No change to current regimen. Keep follow with primary care.    Satira Sark, M.D., F.A.C.C.

## 2014-01-24 NOTE — Patient Instructions (Signed)
Continue all current medications. Your physician wants you to follow up in: 6 months.  You will receive a reminder letter in the mail one-two months in advance.  If you don't receive a letter, please call our office to schedule the follow up appointment  Labs for CBC, BMET just prior to next office visit

## 2014-01-24 NOTE — Assessment & Plan Note (Signed)
Creatinine stable at 1.5.

## 2014-01-24 NOTE — Assessment & Plan Note (Signed)
Paroxysmal, maintaining sinus rhythm at this time. Continue Lopressor and Xarelto. Followup 6 months with CBC and BMET.

## 2014-02-07 ENCOUNTER — Other Ambulatory Visit: Payer: Medicare Other

## 2014-02-07 DIAGNOSIS — E291 Testicular hypofunction: Secondary | ICD-10-CM

## 2014-02-07 DIAGNOSIS — C61 Malignant neoplasm of prostate: Secondary | ICD-10-CM | POA: Diagnosis not present

## 2014-02-08 LAB — TESTOSTERONE,FREE AND TOTAL
TESTOSTERONE FREE: 4.6 pg/mL — AB (ref 6.6–18.1)
Testosterone: 197 ng/dL — ABNORMAL LOW (ref 348–1197)

## 2014-02-08 LAB — PSA, TOTAL AND FREE
PSA FREE PCT: 14 %
PSA, Free: 0.07 ng/mL
PSA: 0.5 ng/mL (ref 0.0–4.0)

## 2014-02-13 DIAGNOSIS — N403 Nodular prostate with lower urinary tract symptoms: Secondary | ICD-10-CM | POA: Diagnosis not present

## 2014-02-13 DIAGNOSIS — N138 Other obstructive and reflux uropathy: Secondary | ICD-10-CM | POA: Diagnosis not present

## 2014-02-13 DIAGNOSIS — E291 Testicular hypofunction: Secondary | ICD-10-CM | POA: Diagnosis not present

## 2014-02-13 DIAGNOSIS — Z8546 Personal history of malignant neoplasm of prostate: Secondary | ICD-10-CM | POA: Diagnosis not present

## 2014-03-02 ENCOUNTER — Telehealth: Payer: Self-pay | Admitting: Cardiology

## 2014-03-02 ENCOUNTER — Telehealth: Payer: Self-pay | Admitting: *Deleted

## 2014-03-02 MED ORDER — RIVAROXABAN 15 MG PO TABS: 15.0000 mg | ORAL_TABLET | Freq: Every day | ORAL | Status: DC

## 2014-03-02 NOTE — Telephone Encounter (Signed)
XARELTO 15 MG #30

## 2014-03-02 NOTE — Telephone Encounter (Signed)
Rivaroxaban (XARELTO) 15 MG TABS tablet [facility] in [place]  Patient is out.  Pharmacy told them they have faxed to [place] twice this week

## 2014-03-02 NOTE — Telephone Encounter (Signed)
Patient notified via voicemail.

## 2014-03-06 ENCOUNTER — Ambulatory Visit (INDEPENDENT_AMBULATORY_CARE_PROVIDER_SITE_OTHER): Payer: Medicare Other

## 2014-03-06 ENCOUNTER — Ambulatory Visit (INDEPENDENT_AMBULATORY_CARE_PROVIDER_SITE_OTHER): Payer: Medicare Other | Admitting: Physician Assistant

## 2014-03-06 ENCOUNTER — Encounter: Payer: Self-pay | Admitting: Physician Assistant

## 2014-03-06 VITALS — BP 114/64 | HR 75 | Temp 98.1°F | Ht 70.5 in | Wt 184.6 lb

## 2014-03-06 DIAGNOSIS — I6529 Occlusion and stenosis of unspecified carotid artery: Secondary | ICD-10-CM | POA: Diagnosis not present

## 2014-03-06 DIAGNOSIS — M546 Pain in thoracic spine: Secondary | ICD-10-CM | POA: Diagnosis not present

## 2014-03-06 NOTE — Progress Notes (Signed)
Subjective:     Patient ID: Erik Mullins, male   DOB: 11/16/1930, 80 y.o.   MRN: [id number]  HPI Pt here for continued mid back pain Seen for same ~ 2 months ago Continues with sx and would like CXR to make sure  Due to hx of COPD  Review of Systems Sx are just medial to the R mid scapular area Sx worse after playing golf Denies any SOB No URI type sx No cough    Objective:   Physical Exam NAD Sl TTP just medial to the R mid scapula No palp spasm FROM of the back- increase in sx with flexion and rotation Heart- RRR w/o M today Lungs- CTA CXR- chronic type changes, degen changes to the T-spine    Assessment:     Back Pain    Plan:     Sx still seem muscular in nature Heat/Ice Warm up before starting golf and finish with warm down F/U prn

## 2014-03-06 NOTE — Patient Instructions (Signed)

## 2014-03-29 ENCOUNTER — Ambulatory Visit: Payer: Medicare Other | Admitting: Family Medicine

## 2014-04-06 ENCOUNTER — Encounter: Payer: Self-pay | Admitting: Pharmacist

## 2014-04-06 ENCOUNTER — Ambulatory Visit (INDEPENDENT_AMBULATORY_CARE_PROVIDER_SITE_OTHER): Payer: Medicare Other | Admitting: Pharmacist

## 2014-04-06 VITALS — BP 118/68 | HR 66 | Ht 70.5 in | Wt 180.0 lb

## 2014-04-06 DIAGNOSIS — F17200 Nicotine dependence, unspecified, uncomplicated: Secondary | ICD-10-CM

## 2014-04-06 DIAGNOSIS — Z13820 Encounter for screening for osteoporosis: Secondary | ICD-10-CM

## 2014-04-06 DIAGNOSIS — Z0000 Encounter for general adult medical examination without abnormal findings: Secondary | ICD-10-CM

## 2014-04-06 DIAGNOSIS — E875 Hyperkalemia: Secondary | ICD-10-CM | POA: Insufficient documentation

## 2014-04-06 NOTE — Patient Instructions (Addendum)
Health Maintenance Summary    PNEUMOCOCCAL POLYSACCHARIDE VACCINE AGE 78 AND OVER Next Due 06/2014 to get Prevnar 13      COLONOSCOPY Postponed 04/23/2014 Originally 09/14/2001. Patient Declined    ZOSTAVAX Postponed 04/23/2014 Originally 09/11/1993. Patient Declined   Prostate Next Due  08/2014 Last done 02/2014   DEXA / Bone Density Due      INFLUENZA VACCINE Next Due 04/14/2014  Last done 2014    TETANUS/TDAP Next Due 02/17/2023  Last done 02/16/2013       Preventive Care for Adults A healthy lifestyle and preventive care can promote health and wellness. Preventive health guidelines for men include the following key practices:  A routine yearly physical is a good way to check with your health care provider about your health and preventative screening. It is a chance to share any concerns and updates on your health and to receive a thorough exam.  Visit your dentist for a routine exam and preventative care every 6 months. Brush your teeth twice a day and floss once a day. Good oral hygiene prevents tooth decay and gum disease.  The frequency of eye exams is based on your age, health, family medical history, use of contact lenses, and other factors. Follow your health care provider's recommendations for frequency of eye exams.  Eat a healthy diet. Foods such as vegetables, fruits, whole grains, low-fat dairy products, and lean protein foods contain the nutrients you need without too many calories. Decrease your intake of foods high in solid fats, added sugars, and salt. Eat the right amount of calories for you.Get information about a proper diet from your health care provider, if necessary.  Regular physical exercise is one of the most important things you can do for your health. Most adults should get at least 150 minutes of moderate-intensity exercise (any activity that increases your heart rate and causes you to sweat) each week. In addition, most adults need muscle-strengthening exercises on 2 or  more days a week.  Maintain a healthy weight. The body mass index (BMI) is a screening tool to identify possible weight problems. It provides an estimate of body fat based on height and weight. Your health care provider can find your BMI and can help you achieve or maintain a healthy weight.For adults 20 years and older:  A BMI below 18.5 is considered underweight.  A BMI of 18.5 to 24.9 is normal.  A BMI of 25 to 29.9 is considered overweight.  A BMI of 30 and above is considered obese.  Maintain normal blood lipids and cholesterol levels by exercising and minimizing your intake of saturated fat. Eat a balanced diet with plenty of fruit and vegetables. Blood tests for lipids and cholesterol should begin at age 20 and be repeated every 5 years. If your lipid or cholesterol levels are high, you are over 50, or you are at high risk for heart disease, you may need your cholesterol levels checked more frequently.Ongoing high lipid and cholesterol levels should be treated with medicines if diet and exercise are not working.  If you smoke, find out from your health care provider how to quit. If you do not use tobacco, do not start.  Lung cancer screening is recommended for adults aged 52-80 years who are at high risk for developing lung cancer because of a history of smoking. A yearly low-dose CT scan of the lungs is recommended for people who have at least a 30-pack-year history of smoking and are a current smoker or have  quit within the past 15 years. A pack year of smoking is smoking an average of 1 pack of cigarettes a day for 1 year (for example: 1 pack a day for 30 years or 2 packs a day for 15 years). Yearly screening should continue until the smoker has stopped smoking for at least 15 years. Yearly screening should be stopped for people who develop a health problem that would prevent them from having lung cancer treatment.  If you choose to drink alcohol, do not have more than 2 drinks per day.  One drink is considered to be 12 ounces (355 mL) of beer, 5 ounces (148 mL) of wine, or 1.5 ounces (44 mL) of liquor.  Avoid use of street drugs. Do not share needles with anyone. Ask for help if you need support or instructions about stopping the use of drugs.  High blood pressure causes heart disease and increases the risk of stroke. Your blood pressure should be checked at least every 1-2 years. Ongoing high blood pressure should be treated with medicines, if weight loss and exercise are not effective.  If you are 81-25 years old, ask your health care provider if you should take aspirin to prevent heart disease.  Diabetes screening involves taking a blood sample to check your fasting blood sugar level. This should be done once every 3 years, after age 40, if you are within normal weight and without risk factors for diabetes. Testing should be considered at a younger age or be carried out more frequently if you are overweight and have at least 1 risk factor for diabetes.  Colorectal cancer can be detected and often prevented. Most routine colorectal cancer screening begins at the age of 50 and continues through age 7. However, your health care provider may recommend screening at an earlier age if you have risk factors for colon cancer. On a yearly basis, your health care provider may provide home test kits to check for hidden blood in the stool. Use of a small camera at the end of a tube to directly examine the colon (sigmoidoscopy or colonoscopy) can detect the earliest forms of colorectal cancer. Talk to your health care provider about this at age 29, when routine screening begins. Direct exam of the colon should be repeated every 5-10 years through age 35, unless early forms of precancerous polyps or small growths are found.  People who are at an increased risk for hepatitis B should be screened for this virus. You are considered at high risk for hepatitis B if:  You were born in a country where  hepatitis B occurs often. Talk with your health care provider about which countries are considered high risk.  Your parents were born in a high-risk country and you have not received a shot to protect against hepatitis B (hepatitis B vaccine).  You have HIV or AIDS.  You use needles to inject street drugs.  You live with, or have sex with, someone who has hepatitis B.  You are a man who has sex with other men (MSM).  You get hemodialysis treatment.  You take certain medicines for conditions such as cancer, organ transplantation, and autoimmune conditions.  Hepatitis C blood testing is recommended for all people born from [birth year] through 1963 and any individual with known risks for hepatitis C.  Practice safe sex. Use condoms and avoid high-risk sexual practices to reduce the spread of sexually transmitted infections (STIs). STIs include gonorrhea, chlamydia, syphilis, trichomonas, herpes, HPV, and human immunodeficiency virus (HIV).  Herpes, HIV, and HPV are viral illnesses that have no cure. They can result in disability, cancer, and death.  If you are at risk of being infected with HIV, it is recommended that you take a prescription medicine daily to prevent HIV infection. This is called preexposure prophylaxis (PrEP). You are considered at risk if:  You are a man who has sex with other men (MSM) and have other risk factors.  You are a heterosexual man, are sexually active, and are at increased risk for HIV infection.  You take drugs by injection.  You are sexually active with a partner who has HIV.  Talk with your health care provider about whether you are at high risk of being infected with HIV. If you choose to begin PrEP, you should first be tested for HIV. You should then be tested every 3 months for as long as you are taking PrEP.  A one-time screening for abdominal aortic aneurysm (AAA) and surgical repair of large AAAs by ultrasound are recommended for men ages 62 to 36 years  who are current or former smokers.  Healthy men should no longer receive prostate-specific antigen (PSA) blood tests as part of routine cancer screening. Talk with your health care provider about prostate cancer screening.  Testicular cancer screening is not recommended for adult males who have no symptoms. Screening includes self-exam, a health care provider exam, and other screening tests. Consult with your health care provider about any symptoms you have or any concerns you have about testicular cancer.  Use sunscreen. Apply sunscreen liberally and repeatedly throughout the day. You should seek shade when your shadow is shorter than you. Protect yourself by wearing long sleeves, pants, a wide-brimmed hat, and sunglasses year round, whenever you are outdoors.  Once a month, do a whole-body skin exam, using a mirror to look at the skin on your back. Tell your health care provider about new moles, moles that have irregular borders, moles that are larger than a pencil eraser, or moles that have changed in shape or color.  Stay current with required vaccines (immunizations).  Influenza vaccine. All adults should be immunized every year.  Tetanus, diphtheria, and acellular pertussis (Td, Tdap) vaccine. An adult who has not previously received Tdap or who does not know his vaccine status should receive 1 dose of Tdap. This initial dose should be followed by tetanus and diphtheria toxoids (Td) booster doses every 10 years. Adults with an unknown or incomplete history of completing a 3-dose immunization series with Td-containing vaccines should begin or complete a primary immunization series including a Tdap dose. Adults should receive a Td booster every 10 years.  Varicella vaccine. An adult without evidence of immunity to varicella should receive 2 doses or a second dose if he has previously received 1 dose.  Human papillomavirus (HPV) vaccine. Males aged 96-21 years who have not received the vaccine  previously should receive the 3-dose series. Males aged 22-26 years may be immunized. Immunization is recommended through the age of 57 years for any male who has sex with males and did not get any or all doses earlier. Immunization is recommended for any person with an immunocompromised condition through the age of 69 years if he did not get any or all doses earlier. During the 3-dose series, the second dose should be obtained 4-8 weeks after the first dose. The third dose should be obtained 24 weeks after the first dose and 16 weeks after the second dose.  Zoster vaccine. One dose  is recommended for adults aged 63 years or older unless certain conditions are present.  Measles, mumps, and rubella (MMR) vaccine. Adults born before [birth year] generally are considered immune to measles and mumps. Adults born in [birth year] or later should have 1 or more doses of MMR vaccine unless there is a contraindication to the vaccine or there is laboratory evidence of immunity to each of the three diseases. A routine second dose of MMR vaccine should be obtained at least 28 days after the first dose for students attending postsecondary schools, health care workers, or international travelers. People who received inactivated measles vaccine or an unknown type of measles vaccine during 1963-1967 should receive 2 doses of MMR vaccine. People who received inactivated mumps vaccine or an unknown type of mumps vaccine before 1979 and are at high risk for mumps infection should consider immunization with 2 doses of MMR vaccine. Unvaccinated health care workers born before [birth year] who lack laboratory evidence of measles, mumps, or rubella immunity or laboratory confirmation of disease should consider measles and mumps immunization with 2 doses of MMR vaccine or rubella immunization with 1 dose of MMR vaccine.  Pneumococcal 13-valent conjugate (PCV13) vaccine. When indicated, a person who is uncertain of his immunization history and has no record  of immunization should receive the PCV13 vaccine. An adult aged 25 years or older who has certain medical conditions and has not been previously immunized should receive 1 dose of PCV13 vaccine. This PCV13 should be followed with a dose of pneumococcal polysaccharide (PPSV23) vaccine. The PPSV23 vaccine dose should be obtained at least 8 weeks after the dose of PCV13 vaccine. An adult aged 103 years or older who has certain medical conditions and previously received 1 or more doses of PPSV23 vaccine should receive 1 dose of PCV13. The PCV13 vaccine dose should be obtained 1 or more years after the last PPSV23 vaccine dose.  Pneumococcal polysaccharide (PPSV23) vaccine. When PCV13 is also indicated, PCV13 should be obtained first. All adults aged 80 years and older should be immunized. An adult younger than age 78 years who has certain medical conditions should be immunized. Any person who resides in a nursing home or long-term care facility should be immunized. An adult smoker should be immunized. People with an immunocompromised condition and certain other conditions should receive both PCV13 and PPSV23 vaccines. People with human immunodeficiency virus (HIV) infection should be immunized as soon as possible after diagnosis. Immunization during chemotherapy or radiation therapy should be avoided. Routine use of PPSV23 vaccine is not recommended for American Indians, [place] Natives, or people younger than 65 years unless there are medical conditions that require PPSV23 vaccine. When indicated, people who have unknown immunization and have no record of immunization should receive PPSV23 vaccine. One-time revaccination 5 years after the first dose of PPSV23 is recommended for people aged 19-64 years who have chronic kidney failure, nephrotic syndrome, asplenia, or immunocompromised conditions. People who received 1-2 doses of PPSV23 before age 47 years should receive another dose of PPSV23 vaccine at age 60 years or  later if at least 5 years have passed since the previous dose. Doses of PPSV23 are not needed for people immunized with PPSV23 at or after age 37 years.  Meningococcal vaccine. Adults with asplenia or persistent complement component deficiencies should receive 2 doses of quadrivalent meningococcal conjugate (MenACWY-D) vaccine. The doses should be obtained at least 2 months apart. Microbiologists working with certain meningococcal bacteria, [place] recruits, people at risk during an outbreak, and people who  travel to or live in countries with a high rate of meningitis should be immunized. A first-year college student up through age 30 years who is living in a residence hall should receive a dose if he did not receive a dose on or after his 16th birthday. Adults who have certain high-risk conditions should receive one or more doses of vaccine.  Hepatitis A vaccine. Adults who wish to be protected from this disease, have certain high-risk conditions, work with hepatitis A-infected animals, work in hepatitis A research labs, or travel to or work in countries with a high rate of hepatitis A should be immunized. Adults who were previously unvaccinated and who anticipate close contact with an international adoptee during the first 60 days after arrival in the [place] from a country with a high rate of hepatitis A should be immunized.  Hepatitis B vaccine. Adults should be immunized if they wish to be protected from this disease, have certain high-risk conditions, may be exposed to blood or other infectious body fluids, are household contacts or sex partners of hepatitis B positive people, are clients or workers in certain care facilities, or travel to or work in countries with a high rate of hepatitis B.

## 2014-04-06 NOTE — Progress Notes (Signed)
Subjective:    Erik Mullins is a 80 y.o. male who presents for Medicare Initial Wellness Visit   Preventive Screening-Counseling & Management  Tobacco History  Smoking status  . Former Smoker -- 0.50 packs/day for 70 years  . Types: Cigarettes  Smokeless tobacco  . Former Systems developer  . Types: Snuff, Chew    Current Problems (verified) Patient Active Problem List   Diagnosis Date Noted  . Thrombocytopenia, unspecified 10/24/2013  . Gout   . Anticoagulation management encounter 09/05/2013  . Atrial fibrillation   . History of prostate cancer 08/14/2013  . Carotid artery stenosis 06/20/2013  . Tobacco user 02/16/2013  . HYPERLIPIDEMIA 01/14/2010  . Essential hypertension, benign 01/14/2010  . PSVT 01/14/2010  . AAA 01/14/2010  . CKD (chronic kidney disease) stage 3, GFR 30-59 ml/min 01/14/2010    Medications Prior to Visit Current Outpatient Prescriptions on File Prior to Visit  Medication Sig Dispense Refill  . allopurinol (ZYLOPRIM) 100 MG tablet Take 1 tablet by mouth  every day  90 tablet  3  . Calcium Carbonate-Vitamin D (CALTRATE 600+D) 600-400 MG-UNIT per tablet Take 1 tablet by mouth daily.        Marland Kitchen ketoconazole (NIZORAL) 2 % cream Apply 1 application topically 2 (two) times daily.       Marland Kitchen lisinopril (PRINIVIL,ZESTRIL) 10 MG tablet TAKE 1 TABLET BY MOUTH EVERY DAY  90 tablet  3  . metoprolol tartrate (LOPRESSOR) 25 MG tablet Take 1 tablet by mouth two  times daily  60 tablet  3  . RESTASIS 0.05 % ophthalmic emulsion Place 1 drop into both eyes 2 (two) times daily.       . Rivaroxaban (XARELTO) 15 MG TABS tablet Take 1 tablet (15 mg total) by mouth daily with supper.  30 tablet  6  . simvastatin (ZOCOR) 20 MG tablet Take 1 tablet by mouth at  bedtime  90 tablet  3  . zinc oxide (BALMEX) 11.3 % CREA cream Apply 1 application topically 2 (two) times daily.       No current facility-administered medications on file prior to visit.    Current Medications (verified) Current  Outpatient Prescriptions  Medication Sig Dispense Refill  . allopurinol (ZYLOPRIM) 100 MG tablet Take 1 tablet by mouth  every day  90 tablet  3  . Calcium Carbonate-Vitamin D (CALTRATE 600+D) 600-400 MG-UNIT per tablet Take 1 tablet by mouth daily.        Marland Kitchen ketoconazole (NIZORAL) 2 % cream Apply 1 application topically 2 (two) times daily.       Marland Kitchen lisinopril (PRINIVIL,ZESTRIL) 10 MG tablet TAKE 1 TABLET BY MOUTH EVERY DAY  90 tablet  3  . metoprolol tartrate (LOPRESSOR) 25 MG tablet Take 1 tablet by mouth two  times daily  60 tablet  3  . RESTASIS 0.05 % ophthalmic emulsion Place 1 drop into both eyes 2 (two) times daily.       . Rivaroxaban (XARELTO) 15 MG TABS tablet Take 1 tablet (15 mg total) by mouth daily with supper.  30 tablet  6  . simvastatin (ZOCOR) 20 MG tablet Take 1 tablet by mouth at  bedtime  90 tablet  3  . zinc oxide (BALMEX) 11.3 % CREA cream Apply 1 application topically 2 (two) times daily.       No current facility-administered medications for this visit.     Allergies (verified) Morphine   PAST HISTORY  Family History Family History  Problem Relation Age of Onset  .  Cancer Mother     uterine deceased age 65  . Arrhythmia Other     Atrial fibrillation  . Hypertension Maternal Grandmother   . Hypertension Sister     Social History History  Substance Use Topics  . Smoking status: Former Smoker -- 0.50 packs/day for 70 years    Types: Cigarettes  . Smokeless tobacco: Former Systems developer    Types: Snuff, Chew  . Alcohol Use: Yes     Comment: 08/22/2013 "drink ~ 1 beer/yr"    Are there smokers in your home (other than you)?  No  Risk Factors Current exercise habits: golfs almost daily  Dietary issues discussed: limiting potassium risk foods due to history of hyperkalemia   Cardiac risk factors: advanced age (older than 103 for men, 81 for women), dyslipidemia, hypertension and male gender.  Depression Screen (Note: if answer to either of the following is  "Yes", a more complete depression screening is indicated)   Q1: Over the past two weeks, have you felt down, depressed or hopeless? No  Q2: Over the past two weeks, have you felt little interest or pleasure in doing things? No  Have you lost interest or pleasure in daily life? No  Do you often feel hopeless? No  Do you cry easily over simple problems? No  Activities of Daily Living In your present state of health, do you have any difficulty performing the following activities?:  Driving? No Managing money?  No Feeding yourself? No Getting from bed to chair? No Climbing a flight of stairs? No Preparing food and eating?: No Bathing or showering? No Getting dressed: No Getting to the toilet? No Using the toilet:No Moving around from place to place: No In the past year have you fallen or had a near fall?:No   Are you sexually active?  No  Do you have more than one partner?  No  Hearing Difficulties: Yes - patient wears hearing aids in both ears Do you often ask people to speak up or repeat themselves? Yes Do you experience ringing or noises in your ears? No Do you have difficulty understanding soft or whispered voices? Yes   Do you feel that you have a problem with memory? No  Do you often misplace items? No  Do you feel safe at home?  Yes  Cognitive Testing  Alert? Yes  Normal Appearance?Yes  Oriented to person? Yes  Place? Yes   Time? Yes  Recall of three objects?  Yes  Can perform simple calculations? Yes  Displays appropriate judgment?Yes  Can read the correct time from a watch face?Yes   Advanced Directives have been discussed with the patient? Yes   List the Names of Other Physician/Practitioners you currently use: 1.  Cardio - Dr Rozann Lesches 2.  Urology - Dr Irine Seal 3.  [place] [facility] 4.  Audiologist - in [place] any recent Medical Services you may have received from other than [facility] providers in the  past year (date may be approximate).  Immunization History  Administered Date(s) Administered  . Tdap 02/16/2013    Screening Tests Health Maintenance  Topic Date Due  . Colonoscopy  04/23/2014 (Originally 09/14/2001)  . Zostavax  04/23/2014 (Originally 09/11/1993)  . Influenza Vaccine  04/14/2014  . Tetanus/tdap  02/17/2023  . Pneumococcal Polysaccharide Vaccine Age 41 And Over  Completed    All answers were reviewed with the patient and necessary referrals were made:  Cherre Robins, [facility]  04/06/2014   History reviewed: allergies, current medications, past family history, past medical history, past social history, past surgical history and problem list  Objective:  Blood pressure 118/68, pulse 66, height 5' 10.5" (1.791 m), weight 180 lb (81.647 kg). Body mass index is 25.45 kg/(m^2).   Assessment:     Medicare Initial Wellness Visit      Plan:     During the course of the visit the patient was educated and counseled about appropriate screening and preventive services including:    Pneumococcal vaccine   Influenza vaccine  Hepatitis B vaccine  Td vaccine  Prostate cancer screening  Colorectal cancer screening  Diabetes screening  Glaucoma screening  Nutrition counseling   Advanced directives: has an advanced directive - a copy has been provided  Referral for DEXA made due to h/o prostate CA and lupron injection   Patient Instructions (the written plan) was given to the patient.  Medicare Attestation I have personally reviewed: The patient's medical and social history Their use of alcohol, tobacco or illicit drugs Their current medications and supplements The patient's functional ability including ADLs,fall risks, home safety risks, cognitive, and hearing and visual impairment Diet and physical activities Evidence for depression or mood disorders  The patient's weight, height, BMI, and BP/HR have been recorded in the chart.  I have made  referrals, counseling, and provided education to the patient based on review of the above and I have provided the patient with a written personalized care plan for preventive services.     Cherre Robins, [facility]   04/06/2014

## 2014-04-06 NOTE — Addendum Note (Signed)
Addended by: Cherre Robins on: 04/06/2014 01:41 PM   Modules accepted: Level of Service

## 2014-05-09 ENCOUNTER — Ambulatory Visit (INDEPENDENT_AMBULATORY_CARE_PROVIDER_SITE_OTHER): Payer: Medicare Other | Admitting: Family Medicine

## 2014-05-09 ENCOUNTER — Encounter: Payer: Self-pay | Admitting: Family Medicine

## 2014-05-09 VITALS — BP 133/82 | HR 58 | Temp 97.1°F | Ht 70.5 in | Wt 180.0 lb

## 2014-05-09 DIAGNOSIS — I6529 Occlusion and stenosis of unspecified carotid artery: Secondary | ICD-10-CM | POA: Diagnosis not present

## 2014-05-09 DIAGNOSIS — I4891 Unspecified atrial fibrillation: Secondary | ICD-10-CM

## 2014-05-09 DIAGNOSIS — I480 Paroxysmal atrial fibrillation: Secondary | ICD-10-CM

## 2014-05-09 DIAGNOSIS — I10 Essential (primary) hypertension: Secondary | ICD-10-CM

## 2014-05-09 DIAGNOSIS — E785 Hyperlipidemia, unspecified: Secondary | ICD-10-CM | POA: Diagnosis not present

## 2014-05-09 NOTE — Progress Notes (Signed)
   Subjective:    Patient ID: Erik Mullins, male    DOB: 03/05/1931, 80 y.o.   MRN: [id number]  HPI 80 year old gentleman here to followup hypertension, hyperlipidemia, antral fibrillation. His wife brings in a history of several medical procedures including a colostomy for bleeding, not cancer, carotid artery disease with the surgery, abdominal aortic aneurysm with stent. He'll so as a history of COPD. He states his breathing is doing well does not use an inhaler on a regular basis. He plays golf and walks the dog on a regular basis without difficulty he denies any palpitations or chest pain recently.    Review of Systems  Constitutional: Negative.   HENT: Negative.   Eyes: Negative.   Respiratory: Negative.  Negative for shortness of breath.   Cardiovascular: Negative.  Negative for chest pain and leg swelling.  Gastrointestinal: Negative.   Genitourinary: Negative.   Musculoskeletal: Negative.   Skin: Negative.   Neurological: Negative.   Psychiatric/Behavioral: Negative.   All other systems reviewed and are negative.      Objective:   Physical Exam  Constitutional: He is oriented to person, place, and time. He appears well-developed and well-nourished.  HENT:  Head: Normocephalic.  Right Ear: External ear normal.  Left Ear: External ear normal.  Nose: Nose normal.  Mouth/Throat: Oropharynx is clear and moist.  Eyes: Conjunctivae and EOM are normal. Pupils are equal, round, and reactive to light.  Neck: Normal range of motion. Neck supple.  Cardiovascular: Normal rate, regular rhythm, normal heart sounds and intact distal pulses.   Pulmonary/Chest: Effort normal and breath sounds normal.  Abdominal: Soft. Bowel sounds are normal.  Musculoskeletal: Normal range of motion.  Neurological: He is alert and oriented to person, place, and time.  Skin: Skin is warm and dry.  Psychiatric: He has a normal mood and affect. His behavior is normal. Judgment and thought content normal.   BP 133/82  Pulse 58  Temp(Src) 97.1 F (36.2 C) (Oral)  Ht 5' 10.5" (1.791 m)  Wt 180 lb (81.647 kg)  BMI 25.45 kg/m2        Assessment & Plan:  1. HYPERLIPIDEMIA Given his age and low LDL, will hold statin and re-check in 6 months  2. Essential hypertension, benign Continue lisinopril and metoprolol  3. Paroxysmal atrial fibrillation In sinus today; on anti-coag  Wardell Honour MD

## 2014-05-16 ENCOUNTER — Ambulatory Visit (INDEPENDENT_AMBULATORY_CARE_PROVIDER_SITE_OTHER): Payer: Medicare Other

## 2014-05-16 ENCOUNTER — Ambulatory Visit (INDEPENDENT_AMBULATORY_CARE_PROVIDER_SITE_OTHER): Payer: Medicare Other | Admitting: Pharmacist

## 2014-05-16 ENCOUNTER — Encounter: Payer: Self-pay | Admitting: Pharmacist

## 2014-05-16 VITALS — Ht 70.5 in | Wt 180.0 lb

## 2014-05-16 DIAGNOSIS — Z13820 Encounter for screening for osteoporosis: Secondary | ICD-10-CM

## 2014-05-16 DIAGNOSIS — Z8546 Personal history of malignant neoplasm of prostate: Secondary | ICD-10-CM

## 2014-05-16 NOTE — Patient Instructions (Signed)

## 2014-05-16 NOTE — Progress Notes (Signed)
Patient ID: Roshun Klingensmith, male   DOB: [birth year], 80 y.o.   MRN: [id number] [facility] Current Height: Height: 5' 10.5" (179.1 cm)      Max Lifetime Height:  6\' 0"  Current Weight: Weight: 180 lb (81.647 kg)       Ethnicity:Caucasian   HPI: Does pt already have a diagnosis of:  Osteopenia?  No Osteoporosis?  No  Back Pain?  Yes       Kyphosis?  No Prior fracture?  Ribs - when fell through roof while at work about 30 years ago Med(s) for Osteoporosis/Osteopenia:  none Med(s) previously tried for Osteoporosis/Osteopenia:  none                                                             PMH: Steroid Use?  No Thyroid med?  No History of cancer?  Yes - prostate.  Treated with Beam radiation and Lupron injections History of digestive disorders (ie Crohn's)?  History of colon resection due to severe GI bleed Current or previous eating disorders?  No Last Vitamin D Result:  43 (2012) Last GFR Result:  41 (12/28/13   FH/SH: Family history of osteoporosis?  No Parent with history of hip fracture?  No Exercise?  Yes - golfs almost daily Smoking?  No Alcohol?  Not regularly - beer 1-2 per year    Calcium Assessment Calcium Intake  # of servings/day  Calcium mg  Milk (8 oz) 1  x  300  = 300mg   Yogurt (4 oz) 0 x  200 = 0  Cheese (1 oz) 1 x  200 = 1  Other Calcium sources   250mg   Ca supplement Calcium qd = 600mg    Estimated calcium intake per day 1150mg     DEXA Results Date of Test T-Score for AP Spine L1-L4 T-Score for Total Left Hip T-Score for Total Right Hip  05/16/2014 1.6 -0.1 -0.1                  Assessment: Normal BMD with history of prostate cancer and lupron use which can affect BMD  Recommendations: 1.  Reviewed BMD results and discussed low fracture risk 2.  recommend calcium 1200mg  daily through supplementation or diet.  3.  continue weight bearing exercise - 30 minutes at least 4 days per week.   4.  Counseled and educated about fall risk and  prevention.  Recheck DEXA:  2 years  Time spent counseling patient:  15 minutes Cherre Robins, PharmD, CPP

## 2014-06-01 ENCOUNTER — Other Ambulatory Visit: Payer: Self-pay | Admitting: *Deleted

## 2014-06-01 DIAGNOSIS — I4891 Unspecified atrial fibrillation: Secondary | ICD-10-CM

## 2014-06-01 NOTE — Progress Notes (Signed)
LAB ORDERS MAILED TO HOME ADDRESS.

## 2014-06-26 ENCOUNTER — Other Ambulatory Visit: Payer: Self-pay

## 2014-06-26 MED ORDER — METOPROLOL TARTRATE 25 MG PO TABS: ORAL_TABLET | ORAL | Status: DC

## 2014-06-28 ENCOUNTER — Telehealth: Payer: Self-pay | Admitting: Family Medicine

## 2014-07-02 ENCOUNTER — Other Ambulatory Visit: Payer: Self-pay | Admitting: *Deleted

## 2014-07-02 MED ORDER — METOPROLOL TARTRATE 25 MG PO TABS: ORAL_TABLET | ORAL | Status: DC

## 2014-07-02 NOTE — Telephone Encounter (Signed)
Discussed with wife where rxs should be sent and she stated it was [facility]. Advised that I would send in meds

## 2014-07-31 ENCOUNTER — Ambulatory Visit (INDEPENDENT_AMBULATORY_CARE_PROVIDER_SITE_OTHER): Payer: Medicare Other

## 2014-07-31 DIAGNOSIS — Z23 Encounter for immunization: Secondary | ICD-10-CM

## 2014-08-02 ENCOUNTER — Other Ambulatory Visit (INDEPENDENT_AMBULATORY_CARE_PROVIDER_SITE_OTHER): Payer: Medicare Other

## 2014-08-02 DIAGNOSIS — C61 Malignant neoplasm of prostate: Secondary | ICD-10-CM

## 2014-08-02 DIAGNOSIS — I4891 Unspecified atrial fibrillation: Secondary | ICD-10-CM

## 2014-08-02 NOTE — Progress Notes (Signed)
Lab only 

## 2014-08-03 LAB — CBC WITH DIFFERENTIAL
BASOS ABS: 0.1 10*3/uL (ref 0.0–0.2)
Basos: 1 %
Eos: 4 %
Eosinophils Absolute: 0.2 10*3/uL (ref 0.0–0.4)
HCT: 44.5 % (ref 37.5–51.0)
Hemoglobin: 15 g/dL (ref 12.6–17.7)
IMMATURE GRANULOCYTES: 0 %
Immature Grans (Abs): 0 10*3/uL (ref 0.0–0.1)
LYMPHS: 22 %
Lymphocytes Absolute: 1.4 10*3/uL (ref 0.7–3.1)
MCH: 29.8 pg (ref 26.6–33.0)
MCHC: 33.7 g/dL (ref 31.5–35.7)
MCV: 88 fL (ref 79–97)
MONOCYTES: 8 %
Monocytes Absolute: 0.5 10*3/uL (ref 0.1–0.9)
NEUTROS PCT: 65 %
Neutrophils Absolute: 4.1 10*3/uL (ref 1.4–7.0)
PLATELETS: 167 10*3/uL (ref 150–379)
RBC: 5.04 x10E6/uL (ref 4.14–5.80)
RDW: 14.2 % (ref 12.3–15.4)
WBC: 6.3 10*3/uL (ref 3.4–10.8)

## 2014-08-03 LAB — BMP8+EGFR
BUN/Creatinine Ratio: 13 (ref 10–22)
BUN: 23 mg/dL (ref 8–27)
CHLORIDE: 99 mmol/L (ref 97–108)
CO2: 27 mmol/L (ref 18–29)
Calcium: 9.6 mg/dL (ref 8.6–10.2)
Creatinine, Ser: 1.77 mg/dL — ABNORMAL HIGH (ref 0.76–1.27)
GFR calc non Af Amer: 35 mL/min/{1.73_m2} — ABNORMAL LOW (ref >59)
GFR, EST AFRICAN AMERICAN: 41 mL/min/{1.73_m2} — AB (ref >59)
GLUCOSE: 120 mg/dL — AB (ref 65–99)
Potassium: 5.3 mmol/L — ABNORMAL HIGH (ref 3.5–5.2)
Sodium: 142 mmol/L (ref 134–144)

## 2014-08-03 LAB — PSA, TOTAL AND FREE
PSA FREE PCT: 16.3 %
PSA FREE: 0.13 ng/mL
PSA: 0.8 ng/mL (ref 0.0–4.0)

## 2014-08-08 ENCOUNTER — Encounter: Payer: Self-pay | Admitting: Cardiology

## 2014-08-08 ENCOUNTER — Ambulatory Visit (INDEPENDENT_AMBULATORY_CARE_PROVIDER_SITE_OTHER): Payer: Medicare Other | Admitting: Cardiology

## 2014-08-08 VITALS — BP 122/67 | HR 50 | Ht 71.0 in | Wt 178.0 lb

## 2014-08-08 DIAGNOSIS — I480 Paroxysmal atrial fibrillation: Secondary | ICD-10-CM | POA: Diagnosis not present

## 2014-08-08 DIAGNOSIS — I10 Essential (primary) hypertension: Secondary | ICD-10-CM | POA: Diagnosis not present

## 2014-08-08 DIAGNOSIS — I6529 Occlusion and stenosis of unspecified carotid artery: Secondary | ICD-10-CM | POA: Diagnosis not present

## 2014-08-08 MED ORDER — METOPROLOL TARTRATE 25 MG PO TABS: 12.5000 mg | ORAL_TABLET | Freq: Two times a day (BID) | ORAL | Status: DC

## 2014-08-08 NOTE — Assessment & Plan Note (Signed)
Continue current regimen except reduce Lopressor to 12.5 mg twice daily, hopefully allow heart rate to come up somewhat to see if this helps any shortness of breath. He continues on Xarelto at renal dose, follow-up BMET and CBC for next visit.

## 2014-08-08 NOTE — Assessment & Plan Note (Signed)
Blood pressure control is good today. 

## 2014-08-08 NOTE — Progress Notes (Signed)
Reason for visit: Atrial fibrillation  Clinical Summary Mr. Rands is an 80 y.o.male last seen in May. He is here with his wife today. He reports no significant palpitations or chest pain. Continues to play golf regularly, also works outdoors. Somewhat short of breath with activity but not progressive. He reports no bleeding problems on Xarelto.  Recent lab work showed hemoglobin 15.0, platelets 167, BUN 23, creatinine 1.7, potassium 5.3.  Echocardiogram from December 2014 demonstrated LVEF 60-65%, no major valvular abnormalities.  Allergies  Allergen Reactions  . Morphine Other (See Comments)    hallucinate    Current Outpatient Prescriptions  Medication Sig Dispense Refill  . allopurinol (ZYLOPRIM) 100 MG tablet Take 1 tablet by mouth  every day 90 tablet 3  . Calcium Carbonate-Vitamin D (CALTRATE 600+D) 600-400 MG-UNIT per tablet Take 1 tablet by mouth daily.      Marland Kitchen ketoconazole (NIZORAL) 2 % cream Apply 1 application topically 2 (two) times daily.     Marland Kitchen lisinopril (PRINIVIL,ZESTRIL) 10 MG tablet TAKE 1 TABLET BY MOUTH EVERY DAY 90 tablet 3  . metoprolol tartrate (LOPRESSOR) 25 MG tablet Take 0.5 tablets (12.5 mg total) by mouth 2 (two) times daily. Take 1 tablet by mouth two  times daily 90 tablet 3  . Rivaroxaban (XARELTO) 15 MG TABS tablet Take 1 tablet (15 mg total) by mouth daily with supper. 30 tablet 6  . zinc oxide (BALMEX) 11.3 % CREA cream Apply 1 application topically 2 (two) times daily.     No current facility-administered medications for this visit.    Past Medical History  Diagnosis Date  . Essential hypertension, benign   . Hyperlipidemia   . Anemia   . History of gout   . AAA (abdominal aortic aneurysm)     Remote ~1994   . Cirrhosis   . PAD (peripheral artery disease)     Stenting in lower extremities (no records)  . 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 Atrial fibrillation     Diagnosed December 2014  . Prostate cancer     XRT; in  remission  . Type 2 diabetes mellitus   . History of GI bleed   . History of hepatitis   . History of stroke   . Arthritis   . CKD (chronic kidney disease) stage 3, GFR 30-59 ml/min   . Gout   . Cataract     bilaterally    Past Surgical History  Procedure Laterality Date  . Abdominal aortic aneurysm repair  1980's    in [place]  . Partial colectomy      "I was bleeding to death inside; took out 2/3 of my colon" (08/22/2013)  . Cholecystectomy    . Colon surgery    . Appendectomy    . Cataract extraction w/ intraocular lens  implant, bilateral Bilateral 2014  . Carotid endarterectomy Right ~ 1999    Social History Mr. Bearse reports that he quit smoking about a year ago. His smoking use included Cigarettes. He started smoking about 69 years ago. He has a 35 pack-year smoking history. He has quit using smokeless tobacco. His smokeless tobacco use included Snuff and Chew. Mr. Tsang reports that he drinks alcohol.  Review of Systems Complete review of systems negative except as otherwise outlined in the clinical summary and also the following. Hard of hearing. No claudication.  Physical Examination Filed Vitals:   08/08/14 1100  BP: 122/67  Pulse: 50   Filed Weights  08/08/14 1100  Weight: 178 lb (80.74 kg)    Appears comfortable at rest.  HEENT: Conjunctiva and lids normal, oropharynx clear.  Neck: Supple, no elevated JVP or loud carotid bruits.  Lungs: Clear to auscultation, nonlabored.  Cardiac: Regular rate and rhythm, no S3.  Abdomen: Soft, nontender, bowel sounds present.  Skin: Scattered tattoos, no ulcerations.  Extremities: No pitting edema, distal pulses 1-2+.    Problem List and Plan   Atrial fibrillation Continue current regimen except reduce Lopressor to 12.5 mg twice daily, hopefully allow heart rate to come up somewhat to see if this helps any shortness of breath. He continues on Xarelto at renal dose, follow-up BMET and CBC for next  visit.  Essential hypertension, benign Blood pressure control is good today.    Satira Sark, M.D., F.A.C.C.

## 2014-08-08 NOTE — Patient Instructions (Addendum)
Your physician recommends that you schedule a follow-up appointment in: 6 months. You will receive a reminder letter in the mail in about 4 months reminding you to call and schedule your appointment. If you don't receive this letter, please contact our office. Your physician has recommended you make the following change in your medication:  Decrease your metoprolol tartrate to 12.5 mg twice daily. Please break your 25 mg tablet in half twice daily. Continue all other medications the same. Your physician recommends that you have lab work in 6 months just before your next visit to check your BMET and CBC.

## 2014-08-14 DIAGNOSIS — E291 Testicular hypofunction: Secondary | ICD-10-CM | POA: Diagnosis not present

## 2014-08-14 DIAGNOSIS — Z8546 Personal history of malignant neoplasm of prostate: Secondary | ICD-10-CM | POA: Diagnosis not present

## 2014-10-03 ENCOUNTER — Other Ambulatory Visit: Payer: Self-pay | Admitting: *Deleted

## 2014-10-03 MED ORDER — RIVAROXABAN 15 MG PO TABS: 15.0000 mg | ORAL_TABLET | Freq: Every day | ORAL | Status: DC

## 2014-10-12 ENCOUNTER — Telehealth: Payer: Self-pay | Admitting: *Deleted

## 2014-10-12 NOTE — Telephone Encounter (Signed)
Dr. Langston Reusing, Dentist wanting instructions on holding xarelto prior to 2 teeth extractions to be scheduled in the next 2 weeks. Will forward to Dr. Domenic Polite.

## 2014-10-15 ENCOUNTER — Encounter: Payer: Self-pay | Admitting: *Deleted

## 2014-10-15 NOTE — Telephone Encounter (Signed)
Phoned Dr. Durenda Age office with instructions as well as faxed letter with instructions.

## 2014-10-15 NOTE — Telephone Encounter (Signed)
Will forward to anticoagulation clinic for review. There is a protocol in place. Generally, Xarelto needs to be held greater than 24 hours prior to invasive procedures.

## 2014-10-15 NOTE — Telephone Encounter (Signed)
Erik Mullins, Pt needs to hold Xarelto 24 hrs prior to dental extractions.  Please send fax to dentist.  Thanks, Erik Mullins

## 2014-11-12 NOTE — Progress Notes (Signed)
   Subjective:    Patient ID: Erik Mullins, male    DOB: July 12, 1931, 81 y.o.   MRN: [id number]  HPI 81 year old gentleman here to follow-up atrial fibrillation hypertension. He is doing very well by history but he does complain of some headache that is above and behind his eyes as well as some thick postnasal drainage and congested feeling. Symptoms would suggest he may have some sinus infection. He also complains of some shortness of breath with exertion. There is no wheezing and no history of COPD.  Patient Active Problem List   Diagnosis Date Noted  . Osteoporosis screening 05/16/2014  . Hyperkalemia, diminished renal excretion 04/06/2014  . Thrombocytopenia, unspecified 10/24/2013  . Gout   . Anticoagulation management encounter 09/05/2013  . Atrial fibrillation   . History of prostate cancer 08/14/2013  . Carotid artery stenosis 06/20/2013  . Tobacco user 02/16/2013  . Hyperlipidemia 01/14/2010  . Essential hypertension, benign 01/14/2010  . PSVT 01/14/2010  . AAA 01/14/2010  . CKD (chronic kidney disease) stage 3, GFR 30-59 ml/min 01/14/2010   Outpatient Encounter Prescriptions as of 11/13/2014  Medication Sig  . allopurinol (ZYLOPRIM) 100 MG tablet Take 1 tablet by mouth  every day  . Calcium Carbonate-Vitamin D (CALTRATE 600+D) 600-400 MG-UNIT per tablet Take 1 tablet by mouth daily.    Marland Kitchen ketoconazole (NIZORAL) 2 % cream Apply 1 application topically 2 (two) times daily.   Marland Kitchen lisinopril (PRINIVIL,ZESTRIL) 10 MG tablet TAKE 1 TABLET BY MOUTH EVERY DAY  . metoprolol tartrate (LOPRESSOR) 25 MG tablet Take 0.5 tablets (12.5 mg total) by mouth 2 (two) times daily. Take 1 tablet by mouth two  times daily  . Rivaroxaban (XARELTO) 15 MG TABS tablet Take 1 tablet (15 mg total) by mouth daily with supper.  . zinc oxide (BALMEX) 11.3 % CREA cream Apply 1 application topically 2 (two) times daily.      Review of Systems  HENT: Positive for congestion and postnasal drip.   Respiratory:  Positive for shortness of breath.   Neurological: Positive for headaches.       Objective:   Physical Exam  Constitutional: He is oriented to person, place, and time. He appears well-developed and well-nourished.  HENT:  Tenderness appreciated in the ethmoid area   Cardiovascular: Normal rate.   Irregular rhythm consistent with A. fib  Pulmonary/Chest: Effort normal.  Neurological: He is alert and oriented to person, place, and time.    BP 106/74 mmHg  Pulse 76  Temp(Src) 96.8 F (36 C) (Oral)  Ht 5\' 11"  (1.803 m)  Wt 179 lb (81.194 kg)  BMI 24.98 kg/m2        Assessment & Plan:  1. Hyperlipidemia Lipids were last checked in November 2015 and he is on no medicine. At that time lipids were well within goal  2. CKD (chronic kidney disease) stage 3, GFR 30-59 ml/min   3. Essential hypertension, benign Blood pressure at 106/74 is well controlled with lisinopril and metoprolol. No changes are recommended  Wardell Honour MD  4. Gout of multiple sites, unspecified cause, unspecified chronicity

## 2014-11-13 ENCOUNTER — Ambulatory Visit (INDEPENDENT_AMBULATORY_CARE_PROVIDER_SITE_OTHER): Payer: Medicare Other | Admitting: Family Medicine

## 2014-11-13 ENCOUNTER — Encounter: Payer: Self-pay | Admitting: Family Medicine

## 2014-11-13 VITALS — BP 106/74 | HR 76 | Temp 96.8°F | Ht 71.0 in | Wt 179.0 lb

## 2014-11-13 DIAGNOSIS — E785 Hyperlipidemia, unspecified: Secondary | ICD-10-CM | POA: Diagnosis not present

## 2014-11-13 DIAGNOSIS — N183 Chronic kidney disease, stage 3 unspecified: Secondary | ICD-10-CM

## 2014-11-13 DIAGNOSIS — I10 Essential (primary) hypertension: Secondary | ICD-10-CM

## 2014-11-13 DIAGNOSIS — M109 Gout, unspecified: Secondary | ICD-10-CM

## 2014-11-13 MED ORDER — AMOXICILLIN 875 MG PO TABS: 875.0000 mg | ORAL_TABLET | Freq: Two times a day (BID) | ORAL | Status: DC

## 2014-11-15 ENCOUNTER — Other Ambulatory Visit: Payer: Self-pay | Admitting: *Deleted

## 2014-11-15 MED ORDER — ALLOPURINOL 100 MG PO TABS: ORAL_TABLET | ORAL | Status: DC

## 2014-11-20 ENCOUNTER — Ambulatory Visit (INDEPENDENT_AMBULATORY_CARE_PROVIDER_SITE_OTHER): Payer: Medicare Other

## 2014-11-20 ENCOUNTER — Telehealth: Payer: Self-pay | Admitting: *Deleted

## 2014-11-20 ENCOUNTER — Ambulatory Visit (INDEPENDENT_AMBULATORY_CARE_PROVIDER_SITE_OTHER): Payer: Medicare Other | Admitting: Nurse Practitioner

## 2014-11-20 ENCOUNTER — Encounter: Payer: Self-pay | Admitting: Nurse Practitioner

## 2014-11-20 VITALS — BP 129/70 | HR 66 | Temp 96.9°F | Ht 71.0 in | Wt 182.0 lb

## 2014-11-20 DIAGNOSIS — S93602A Unspecified sprain of left foot, initial encounter: Secondary | ICD-10-CM

## 2014-11-20 DIAGNOSIS — M25572 Pain in left ankle and joints of left foot: Secondary | ICD-10-CM | POA: Diagnosis not present

## 2014-11-20 DIAGNOSIS — I4891 Unspecified atrial fibrillation: Secondary | ICD-10-CM

## 2014-11-20 NOTE — Telephone Encounter (Signed)
-----   Message from Chanda Busing sent at 11/16/2014 11:18 AM EST ----- Regarding: LABS DUE  Has appointment with Dr. Domenic Polite on 02-12-15 -appt states needs blood work. Patient states not aware. Please call and verify. Thank you.

## 2014-11-20 NOTE — Progress Notes (Signed)
   Subjective:    Patient ID: Erik Mullins, male    DOB: 1931/08/06, 81 y.o.   MRN: [id number]  HPI Patient is here today complaining of hurting his left foot walking. He reports his toe bent back while trying to break a fall. Incident happen 4 days ago. He reports swelling and pain to the left foot. Pain is 3/10 resting, he reports pain of 8/10 when walking.    Review of Systems  Constitutional: Negative.   HENT: Negative.   Eyes: Negative.   Respiratory: Negative.   Cardiovascular: Negative.   Gastrointestinal: Negative.   Endocrine: Negative.   Genitourinary: Negative.   Musculoskeletal: Negative.   Skin: Negative.   Neurological: Negative.   Hematological: Negative.   Psychiatric/Behavioral: Negative.        Objective:   Physical Exam  Constitutional: He is oriented to person, place, and time. He appears well-developed and well-nourished.  HENT:  Head: Normocephalic and atraumatic.  Eyes: Pupils are equal, round, and reactive to light.  Neck: Normal range of motion.  Cardiovascular: Normal rate.   Pulmonary/Chest: Effort normal.  Musculoskeletal: Normal range of motion. He exhibits edema and tenderness.  Left foot.   Neurological: He is alert and oriented to person, place, and time.  Skin: Skin is warm.  Psychiatric: He has a normal mood and affect. His behavior is normal. Judgment and thought content normal.   BP 129/70 mmHg  Pulse 66  Temp(Src) 96.9 F (36.1 C) (Oral)  Ht 5\' 11"  (1.803 m)  Wt 182 lb (82.555 kg)  BMI 25.40 kg/m2   Foot x-ray normal no fracture. Preliminary reading by Ronnald Collum, FNP  [facility]      Assessment & Plan:   1. Pain in joint, ankle and foot, left   2. Foot sprain, left, initial encounter    Orders Placed This Encounter  Procedures  . DG Foot Complete Left   Ankle brace Apply a compressive ACE bandage. Rest and elevate the affected painful area.   Apply cold compresses intermittently as needed.  As pain recedes, begin normal  activities slowly as tolerated.   Call if symptoms persist.  Mary-Margaret Hassell Done, FNP   [place]

## 2014-11-20 NOTE — Telephone Encounter (Signed)
Patient informed via message machine that lab orders would be mailed to his home address.

## 2014-11-20 NOTE — Patient Instructions (Signed)

## 2015-01-16 ENCOUNTER — Other Ambulatory Visit: Payer: Self-pay | Admitting: *Deleted

## 2015-01-16 MED ORDER — RIVAROXABAN 15 MG PO TABS: 15.0000 mg | ORAL_TABLET | Freq: Every day | ORAL | Status: DC

## 2015-02-04 ENCOUNTER — Other Ambulatory Visit: Payer: Self-pay

## 2015-02-04 DIAGNOSIS — I10 Essential (primary) hypertension: Secondary | ICD-10-CM

## 2015-02-04 MED ORDER — LISINOPRIL 10 MG PO TABS
ORAL_TABLET | ORAL | Status: DC
Start: 2015-02-04 — End: 2015-04-03

## 2015-02-05 ENCOUNTER — Other Ambulatory Visit (INDEPENDENT_AMBULATORY_CARE_PROVIDER_SITE_OTHER): Payer: Medicare Other

## 2015-02-05 DIAGNOSIS — Z8546 Personal history of malignant neoplasm of prostate: Secondary | ICD-10-CM | POA: Diagnosis not present

## 2015-02-05 DIAGNOSIS — I4891 Unspecified atrial fibrillation: Secondary | ICD-10-CM

## 2015-02-05 NOTE — Addendum Note (Signed)
Addended by: Earlene Plater on: 02/05/2015 08:09 AM   Modules accepted: Orders

## 2015-02-05 NOTE — Progress Notes (Signed)
Lab work for Dr Jeffie Pollock Z85.46 Lab work for Dr Rozann Lesches i48.91

## 2015-02-06 LAB — CBC WITH DIFFERENTIAL/PLATELET
Basophils Absolute: 0.1 10*3/uL (ref 0.0–0.2)
Basos: 1 %
EOS (ABSOLUTE): 0.3 10*3/uL (ref 0.0–0.4)
Eos: 4 %
HEMOGLOBIN: 16 g/dL (ref 12.6–17.7)
Hematocrit: 47.5 % (ref 37.5–51.0)
Immature Grans (Abs): 0 10*3/uL (ref 0.0–0.1)
Immature Granulocytes: 0 %
Lymphocytes Absolute: 1.7 10*3/uL (ref 0.7–3.1)
Lymphs: 23 %
MCH: 30 pg (ref 26.6–33.0)
MCHC: 33.7 g/dL (ref 31.5–35.7)
MCV: 89 fL (ref 79–97)
Monocytes Absolute: 0.5 10*3/uL (ref 0.1–0.9)
Monocytes: 6 %
NEUTROS ABS: 5 10*3/uL (ref 1.4–7.0)
Neutrophils: 66 %
PLATELETS: 172 10*3/uL (ref 150–379)
RBC: 5.34 x10E6/uL (ref 4.14–5.80)
RDW: 14.4 % (ref 12.3–15.4)
WBC: 7.6 10*3/uL (ref 3.4–10.8)

## 2015-02-06 LAB — BMP8+EGFR
BUN / CREAT RATIO: 12 (ref 10–22)
BUN: 18 mg/dL (ref 8–27)
CALCIUM: 9.4 mg/dL (ref 8.6–10.2)
CHLORIDE: 99 mmol/L (ref 97–108)
CO2: 27 mmol/L (ref 18–29)
CREATININE: 1.48 mg/dL — AB (ref 0.76–1.27)
GFR calc non Af Amer: 44 mL/min/{1.73_m2} — ABNORMAL LOW (ref >59)
GFR, EST AFRICAN AMERICAN: 51 mL/min/{1.73_m2} — AB (ref >59)
GLUCOSE: 93 mg/dL (ref 65–99)
Potassium: 5.1 mmol/L (ref 3.5–5.2)
SODIUM: 141 mmol/L (ref 134–144)

## 2015-02-06 LAB — PSA, TOTAL AND FREE
PSA, Free Pct: 13.1 %
PSA, Free: 0.17 ng/mL
Prostate Specific Ag, Serum: 1.3 ng/mL (ref 0.0–4.0)

## 2015-02-07 ENCOUNTER — Telehealth: Payer: Self-pay | Admitting: *Deleted

## 2015-02-07 NOTE — Telephone Encounter (Signed)
-----   Message from Satira Sark, MD sent at 02/06/2015  7:54 AM EDT ----- Reviewed. CBC normal. Creatinine 1.5, down from 1.7. Continue current medication.

## 2015-02-08 NOTE — Telephone Encounter (Signed)
Patient informed. 

## 2015-02-12 ENCOUNTER — Ambulatory Visit (INDEPENDENT_AMBULATORY_CARE_PROVIDER_SITE_OTHER): Payer: Medicare Other | Admitting: Cardiology

## 2015-02-12 ENCOUNTER — Encounter: Payer: Self-pay | Admitting: Cardiology

## 2015-02-12 VITALS — BP 119/72 | HR 71 | Ht 71.0 in | Wt 175.1 lb

## 2015-02-12 DIAGNOSIS — I4891 Unspecified atrial fibrillation: Secondary | ICD-10-CM | POA: Diagnosis not present

## 2015-02-12 DIAGNOSIS — I10 Essential (primary) hypertension: Secondary | ICD-10-CM

## 2015-02-12 DIAGNOSIS — I480 Paroxysmal atrial fibrillation: Secondary | ICD-10-CM | POA: Diagnosis not present

## 2015-02-12 NOTE — Progress Notes (Signed)
.    Cardiology Office Note  Date: 02/12/2015   ID: Erik Mullins, DOB August 03, 1931, MRN [id number]  PCP: Wardell Honour, MD  Primary Cardiologist: Rozann Lesches, MD   Chief Complaint  Patient presents with  . Atrial Fibrillation    History of Present Illness: Erik Mullins is an 81 y.o. male last seen in November 2015. At the last visit we reduced dose of Lopressor. He comes in today for a routine visit with his wife. From a cardiac perspective, he reports no palpitations or angina. He has been playing golf or practicing putting almost every day. He typically has NYHA class II dyspnea.  He continues on Xarelto. No bleeding problems.  Follow-up ECG today shows sinus rhythm.   Past Medical History  Diagnosis Date  . Hyperlipidemia   . Anemia   . History of gout   . AAA (abdominal aortic aneurysm)     Remote ~1994   . Cirrhosis   . PAD (peripheral artery disease)     Stenting in lower extremities (no records)  . 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 Atrial fibrillation     Diagnosed December 2014  . Prostate cancer     XRT; in remission  . Type 2 diabetes mellitus   . History of GI bleed   . History of hepatitis   . History of stroke   . Arthritis   . CKD (chronic kidney disease) stage 3, GFR 30-59 ml/min   . Gout   . Cataract     Bilateral  . Essential hypertension, benign      Current Outpatient Prescriptions  Medication Sig Dispense Refill  . allopurinol (ZYLOPRIM) 100 MG tablet Take 1 tablet by mouth  every day 90 tablet 1  . Calcium Carbonate-Vitamin D (CALTRATE 600+D) 600-400 MG-UNIT per tablet Take 1 tablet by mouth daily.      Marland Kitchen lisinopril (PRINIVIL,ZESTRIL) 10 MG tablet TAKE 1 TABLET BY MOUTH EVERY DAY 90 tablet 0  . metoprolol tartrate (LOPRESSOR) 25 MG tablet Take 0.5 tablets (12.5 mg total) by mouth 2 (two) times daily. Take 1 tablet by mouth two  times daily 90 tablet 3  . Rivaroxaban (XARELTO) 15 MG TABS tablet Take 1 tablet (15 mg  total) by mouth daily with supper. 90 tablet 3   No current facility-administered medications for this visit.    Allergies:  Morphine   Social History: The patient  reports that he quit smoking about 17 months ago. His smoking use included Cigarettes. He started smoking about 70 years ago. He has a 35 pack-year smoking history. He has quit using smokeless tobacco. His smokeless tobacco use included Snuff and Chew. He reports that he drinks alcohol. He reports that he does not use illicit drugs.   ROS:  Please see the history of present illness. Otherwise, complete review of systems is positive for decreased hearing.  All other systems are reviewed and negative.   Physical Exam: VS:  BP 119/72 mmHg  Pulse 71  Ht '5\' 11"'$  (1.803 m)  Wt 175 lb 1.9 oz (79.434 kg)  BMI 24.44 kg/m2  SpO2 96%, BMI Body mass index is 24.44 kg/(m^2).  Wt Readings from Last 3 Encounters:  02/12/15 175 lb 1.9 oz (79.434 kg)  11/20/14 182 lb (82.555 kg)  11/13/14 179 lb (81.194 kg)     Appears comfortable at rest.  HEENT: Conjunctiva and lids normal, oropharynx clear.  Neck: Supple, no elevated JVP or loud carotid bruits.  Lungs:  Clear to auscultation, nonlabored.  Cardiac: Regular rate and rhythm, no S3.  Abdomen: Soft, nontender, bowel sounds present.  Skin: Scattered tattoos, no ulcerations.  Extremities: No pitting edema, distal pulses 1-2+.    ECG: ECG is ordered today and shows sinus rhythm.   Recent Labwork: 08/02/2014: Hemoglobin 15.0; Platelets 167 02/05/2015: BUN 18; Creatinine 1.48*; Potassium 5.1; Sodium 141    Assessment and Plan:  1. Paroxysmal atrial fibrillation, symptomatically well controlled on current regimen. Continue on low-dose Lopressor as well as Xarelto.  2. Essential hypertension, blood pressure is normal today.  Current medicines were reviewed with the patient today.   Orders Placed This Encounter  Procedures  . EKG 12-Lead    Disposition: FU with me in 6  months.   Signed, Satira Sark, MD, [facility] 02/12/2015 2:14 PM    [facility] [place], [place], [place] [address] Phone: [phone]; Fax: [phone]

## 2015-02-12 NOTE — Patient Instructions (Signed)
Your physician recommends that you continue on your current medications as directed. Please refer to the Current Medication list given to you today. Your physician recommends that you schedule a follow-up appointment in: 6 months. You will receive a reminder letter in the mail in about 4 months reminding you to call and schedule your appointment. If you don't receive this letter, please contact our office. 

## 2015-02-22 ENCOUNTER — Ambulatory Visit (INDEPENDENT_AMBULATORY_CARE_PROVIDER_SITE_OTHER): Payer: Medicare Other | Admitting: Urology

## 2015-02-22 DIAGNOSIS — E291 Testicular hypofunction: Secondary | ICD-10-CM | POA: Diagnosis not present

## 2015-02-22 DIAGNOSIS — Z8546 Personal history of malignant neoplasm of prostate: Secondary | ICD-10-CM | POA: Diagnosis not present

## 2015-02-22 DIAGNOSIS — N403 Nodular prostate with lower urinary tract symptoms: Secondary | ICD-10-CM | POA: Diagnosis not present

## 2015-04-03 ENCOUNTER — Other Ambulatory Visit: Payer: Self-pay | Admitting: Nurse Practitioner

## 2015-04-29 ENCOUNTER — Encounter: Payer: Self-pay | Admitting: *Deleted

## 2015-05-14 ENCOUNTER — Other Ambulatory Visit: Payer: Self-pay | Admitting: Cardiology

## 2015-05-14 NOTE — Telephone Encounter (Signed)
Patient's wife states that patient was prescribed inhaler while is hospital 1 yr ago.  States that he needs refill. / tg

## 2015-05-15 NOTE — Telephone Encounter (Signed)
Wife informed that there was no inhaler listed on patient's profile here and that she needed to contact his PCP for refills on inhalers. Wife said patient would be seeing PCP on Wednesday and would request it at that time.

## 2015-05-22 ENCOUNTER — Encounter: Payer: Self-pay | Admitting: Family Medicine

## 2015-05-22 ENCOUNTER — Ambulatory Visit (INDEPENDENT_AMBULATORY_CARE_PROVIDER_SITE_OTHER): Payer: Medicare Other | Admitting: Family Medicine

## 2015-05-22 VITALS — BP 133/77 | HR 68 | Temp 97.0°F | Ht 71.0 in | Wt 174.0 lb

## 2015-05-22 DIAGNOSIS — Z8546 Personal history of malignant neoplasm of prostate: Secondary | ICD-10-CM | POA: Diagnosis not present

## 2015-05-22 DIAGNOSIS — I480 Paroxysmal atrial fibrillation: Secondary | ICD-10-CM

## 2015-05-22 DIAGNOSIS — N183 Chronic kidney disease, stage 3 unspecified: Secondary | ICD-10-CM

## 2015-05-22 DIAGNOSIS — M109 Gout, unspecified: Secondary | ICD-10-CM | POA: Diagnosis not present

## 2015-05-22 DIAGNOSIS — I10 Essential (primary) hypertension: Secondary | ICD-10-CM

## 2015-05-22 DIAGNOSIS — E785 Hyperlipidemia, unspecified: Secondary | ICD-10-CM

## 2015-05-22 MED ORDER — ALBUTEROL SULFATE HFA 108 (90 BASE) MCG/ACT IN AERS: 1.0000 | INHALATION_SPRAY | Freq: Four times a day (QID) | RESPIRATORY_TRACT | Status: DC | PRN

## 2015-05-22 NOTE — Progress Notes (Signed)
   Subjective:    Patient ID: Erik Mullins, male    DOB: [birth year], 81 y.o.   MRN: [id number]  HPI 81 year old gentleman who comes today complaining with some shortness of breath and breathing problems. Symptoms are worse related to exercise like when he walks up hills playing golf. He has a lot of drainage that's worse in the mornings but rarely coughs up anything. He was told when he had onset of atrial fibrillation he had COPD but a chest x-ray done here in June 2015 made no mention of any lung disease. 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  Patient Active Problem List   Diagnosis Date Noted  . Osteoporosis screening 05/16/2014  . Hyperkalemia, diminished renal excretion 04/06/2014  . Thrombocytopenia, unspecified 10/24/2013  . Gout   . Anticoagulation management encounter 09/05/2013  . Atrial fibrillation   . History of prostate cancer 08/14/2013  . Carotid artery stenosis 06/20/2013  . Tobacco user 02/16/2013  . Hyperlipidemia 01/14/2010  . Essential hypertension, benign 01/14/2010  . PSVT 01/14/2010  . AAA 01/14/2010  . CKD (chronic kidney disease) stage 3, GFR 30-59 ml/min 01/14/2010   Outpatient Encounter Prescriptions as of 05/22/2015  Medication Sig  . allopurinol (ZYLOPRIM) 100 MG tablet Take 1 tablet by mouth  every day  . Calcium Carbonate-Vitamin D (CALTRATE 600+D) 600-400 MG-UNIT per tablet Take 1 tablet by mouth daily.    Marland Kitchen lisinopril (PRINIVIL,ZESTRIL) 10 MG tablet Take 1 tablet by mouth  every day  . metoprolol tartrate (LOPRESSOR) 25 MG tablet Take 0.5 tablets (12.5 mg total) by mouth 2 (two) times daily. Take 1 tablet by mouth two  times daily  . Rivaroxaban (XARELTO) 15 MG TABS tablet Take 1 tablet (15 mg total) by mouth daily with supper.   No facility-administered encounter medications on file as of 05/22/2015.        Review of Systems  Constitutional: Negative.     Respiratory: Positive for shortness of breath.   Cardiovascular: Negative.   Neurological: Negative.   Psychiatric/Behavioral: Negative.        Objective:   Physical Exam  Constitutional: He appears well-developed and well-nourished.  Cardiovascular: Normal rate and regular rhythm.   Rhythm is regular and what appear to be in normal sinus rhythm today  Pulmonary/Chest: Effort normal and breath sounds normal. He has no wheezes.     BP 133/77 mmHg  Pulse 68  Temp(Src) 97 F (36.1 C) (Oral)  Ht '5\' 11"'$  (1.803 m)  Wt 174 lb (78.926 kg)  BMI 24.28 kg/m2      Assessment & Plan:  1. Hyperlipidemia Pins are at goal on no medication  2. Essential hypertension, benign Blood pressure is well managed today on lisinopril and metoprolol  3. History of prostate cancer This problem is not current  4. CKD (chronic kidney disease) stage 3, GFR 30-59 ml/min Kidney disease is stable  5. Gout of multiple sites, unspecified cause, unspecified chronicity This problem is stable at present  6. Paroxysmal atrial fibrillation Appears to be in normal sinus rhythm today. He is followed by cardiology semiannually. Denies palpitations   Wardell Honour MD

## 2015-05-23 ENCOUNTER — Ambulatory Visit: Payer: Medicare Other | Admitting: Family Medicine

## 2015-06-13 ENCOUNTER — Encounter: Payer: Self-pay | Admitting: Family Medicine

## 2015-06-13 ENCOUNTER — Ambulatory Visit (INDEPENDENT_AMBULATORY_CARE_PROVIDER_SITE_OTHER): Payer: Medicare Other | Admitting: Family Medicine

## 2015-06-13 VITALS — BP 117/70 | HR 70 | Temp 96.9°F | Ht 71.0 in | Wt 174.0 lb

## 2015-06-13 DIAGNOSIS — M7041 Prepatellar bursitis, right knee: Secondary | ICD-10-CM | POA: Diagnosis not present

## 2015-06-13 MED ORDER — LORAZEPAM 1 MG PO TABS: 1.0000 mg | ORAL_TABLET | Freq: Every day | ORAL | Status: DC

## 2015-06-13 NOTE — Progress Notes (Signed)
   Subjective:    Patient ID: Erik Mullins, male    DOB: 1931/07/29, 81 y.o.   MRN: [id number]  HPI Patient here today for right knee bruising and edema. This was first noticed about 1 1/2 - 2 weeks ago. Patient does not recall any trauma but upon further questioning he was putting some weather stripping around the door and was on his knees to do that.     Patient Active Problem List   Diagnosis Date Noted  . Osteoporosis screening 05/16/2014  . Hyperkalemia, diminished renal excretion 04/06/2014  . Thrombocytopenia, unspecified 10/24/2013  . Gout   . Anticoagulation management encounter 09/05/2013  . Atrial fibrillation   . History of prostate cancer 08/14/2013  . Carotid artery stenosis 06/20/2013  . Tobacco user 02/16/2013  . Hyperlipidemia 01/14/2010  . Essential hypertension, benign 01/14/2010  . PSVT 01/14/2010  . AAA 01/14/2010  . CKD (chronic kidney disease) stage 3, GFR 30-59 ml/min 01/14/2010   Outpatient Encounter Prescriptions as of 06/13/2015  Medication Sig  . albuterol (PROVENTIL HFA;VENTOLIN HFA) 108 (90 BASE) MCG/ACT inhaler Inhale 1-2 puffs into the lungs every 6 (six) hours as needed for wheezing or shortness of breath.  . allopurinol (ZYLOPRIM) 100 MG tablet Take 1 tablet by mouth  every day  . Calcium Carbonate-Vitamin D (CALTRATE 600+D) 600-400 MG-UNIT per tablet Take 1 tablet by mouth daily.    Marland Kitchen lisinopril (PRINIVIL,ZESTRIL) 10 MG tablet Take 1 tablet by mouth  every day  . metoprolol tartrate (LOPRESSOR) 25 MG tablet Take 0.5 tablets (12.5 mg total) by mouth 2 (two) times daily. Take 1 tablet by mouth two  times daily  . Rivaroxaban (XARELTO) 15 MG TABS tablet Take 1 tablet (15 mg total) by mouth daily with supper.   No facility-administered encounter medications on file as of 06/13/2015.      Review of Systems  Constitutional: Negative.   HENT: Negative.   Eyes: Negative.   Respiratory: Negative.   Gastrointestinal: Negative.   Endocrine: Negative.    Genitourinary: Negative.   Musculoskeletal: Negative.        Right knee swelling and bruise  Skin: Negative.   Allergic/Immunologic: Negative.   Neurological: Negative.   Hematological: Negative.   Psychiatric/Behavioral: Negative.        Objective:   Physical Exam  Musculoskeletal:  Right knee: There is some swelling over the patella consistent with prepatellar bursitis. There is some bruising medially. Exam of the knee shows no crepitance and no instability.          Assessment & Plan:  1. Prepatellar bursitis, right Would normally use anti-inflammatory but since he is on Xarelto will withhold anti-inflammatory's but reassure her and use ice packs 3-4 times a day. Problem should resolve with time.  Wardell Honour MD

## 2015-07-01 ENCOUNTER — Other Ambulatory Visit: Payer: Self-pay | Admitting: Family Medicine

## 2015-08-05 ENCOUNTER — Ambulatory Visit: Payer: Medicare Other | Admitting: Cardiology

## 2015-08-06 ENCOUNTER — Other Ambulatory Visit: Payer: Self-pay | Admitting: Cardiology

## 2015-08-28 ENCOUNTER — Encounter: Payer: Self-pay | Admitting: Cardiology

## 2015-08-28 ENCOUNTER — Ambulatory Visit (INDEPENDENT_AMBULATORY_CARE_PROVIDER_SITE_OTHER): Payer: Medicare Other | Admitting: Cardiology

## 2015-08-28 VITALS — BP 117/70 | HR 58 | Ht 71.0 in | Wt 177.0 lb

## 2015-08-28 DIAGNOSIS — I10 Essential (primary) hypertension: Secondary | ICD-10-CM

## 2015-08-28 DIAGNOSIS — I480 Paroxysmal atrial fibrillation: Secondary | ICD-10-CM | POA: Diagnosis not present

## 2015-08-28 MED ORDER — RIVAROXABAN 15 MG PO TABS: 15.0000 mg | ORAL_TABLET | Freq: Every day | ORAL | Status: DC

## 2015-08-28 NOTE — Patient Instructions (Signed)
Your physician wants you to follow-up in: 6 months with Dr. Ferne Reus will receive a reminder letter in the mail two months in advance. If you don't receive a letter, please call our office to schedule the follow-up appointment.  Your physician recommends that you continue on your current medications as directed. Please refer to the Current Medication list given to you today.  Your physician recommends that you return for lab work WE HAVE GIVEN YOU ORDERS TO TAKE TO YOUR PRIMARY PHYSICIAN BMP/CBC  Thank you for choosing [facility]!!

## 2015-08-28 NOTE — Progress Notes (Signed)
Cardiology Office Note  Date: 08/28/2015   ID: Erik Mullins, DOB 05-May-1931, MRN [id number]  PCP: Wardell Honour, MD  Primary Cardiologist: Rozann Lesches, MD   Chief Complaint  Patient presents with  . Atrial Fibrillation    History of Present Illness: Erik Mullins is an 81 y.o. male last seen in May. He is here with his wife today for a follow-up visit. He does not report any change in functional capacity. Only very brief, rare palpitations. He continues to enjoy golf.  We reviewed his medications which are outlined below. He does not report any spontaneous bleeding problems on Xarelto. He also continues on Lopressor and lisinopril.  He did have lab work done back in May as outlined above. No follow-up since that time.  Past Medical History  Diagnosis Date  . Hyperlipidemia   . Anemia   . History of gout   . AAA (abdominal aortic aneurysm) ([place])     Remote ~1994   . Cirrhosis ([place])   . PAD (peripheral artery disease) (HCC)     Stenting in lower extremities (no records)  . AVNRT (AV nodal re-entry tachycardia) ([place])     Possible  . Atrial fibrillation [facility])     Diagnosed December 2014  . Prostate cancer ([place])     XRT; in remission  . Type 2 diabetes mellitus ([place])   . History of GI bleed   . History of hepatitis   . History of stroke   . Arthritis   . CKD (chronic kidney disease) stage 3, GFR 30-59 ml/min   . Gout   . Cataract     Bilateral  . Essential hypertension, benign     Current Outpatient Prescriptions  Medication Sig Dispense Refill  . albuterol (PROVENTIL HFA;VENTOLIN HFA) 108 (90 BASE) MCG/ACT inhaler Inhale 1-2 puffs into the lungs every 6 (six) hours as needed for wheezing or shortness of breath. 1 Inhaler 0  . allopurinol (ZYLOPRIM) 100 MG tablet Take 1 tablet by mouth  every day 90 tablet 1  . Calcium Carbonate-Vitamin D (CALTRATE 600+D) 600-400 MG-UNIT per tablet Take 1 tablet by mouth daily.      Marland Kitchen lisinopril (PRINIVIL,ZESTRIL) 10 MG tablet  Take 1 tablet by mouth  every day 90 tablet 0  . metoprolol tartrate (LOPRESSOR) 25 MG tablet Take one-half tablet by  mouth twice a day 90 tablet 3  . Multiple Vitamin (MULTIVITAMIN) tablet Take 1 tablet by mouth daily.    . Rivaroxaban (XARELTO) 15 MG TABS tablet Take 1 tablet (15 mg total) by mouth daily with supper. 90 tablet 3   No current facility-administered medications for this visit.   Allergies:  Morphine   Social History: The patient  reports that he quit smoking about 2 years ago. His smoking use included Cigarettes. He started smoking about 71 years ago. He has a 35 pack-year smoking history. He has quit using smokeless tobacco. His smokeless tobacco use included Snuff and Chew. He reports that he drinks alcohol. He reports that he does not use illicit drugs.   ROS:  Please see the history of present illness. Otherwise, complete review of systems is positive for diminished hearing, uses hearing aids.  All other systems are reviewed and negative.   Physical Exam: VS:  BP 117/70 mmHg  Pulse 58  Ht '5\' 11"'$  (1.803 m)  Wt 177 lb (80.287 kg)  BMI 24.70 kg/m2  SpO2 93%, BMI Body mass index is 24.7 kg/(m^2).  Wt Readings from Last 3 Encounters:  08/28/15 177 lb (80.287 kg)  06/13/15 174 lb (78.926 kg)  05/22/15 174 lb (78.926 kg)    Appears comfortable at rest.  HEENT: Conjunctiva and lids normal, oropharynx clear.  Neck: Supple, no elevated JVP or loud carotid bruits.  Lungs: Clear to auscultation, nonlabored.  Cardiac: Regular rate and rhythm, no S3.  Abdomen: Soft, nontender, bowel sounds present.  Skin: Scattered tattoos, no ulcerations.  Extremities: No pitting edema, distal pulses 1-2+.   ECG: Tracing from 02/12/2015 showed sinus rhythm with sinus arrhythmia.  Recent Labwork: 02/05/2015: BUN 18; Creatinine, Ser 1.48*; Potassium 5.1; Sodium 141     Component Value Date/Time   CHOL 147 10/24/2013 0909   CHOL 122 02/07/2013 0903   TRIG 168* 10/24/2013 0909    TRIG 140 02/07/2013 0903   HDL 44 10/24/2013 0909   HDL 41 02/07/2013 0903   LDLCALC 69 10/24/2013 0909   LDLCALC 53 02/07/2013 0903    Other Studies Reviewed Today:  Echocardiogram 08/23/2013: Study Conclusions  Left ventricle: The cavity size was normal. Wall thickness was normal. Systolic function was normal. The estimated ejection fraction was in the range of 60% to 65%.  Assessment and Plan:  1. Paroxysmal atrial fibrillation. We will continue Xarelto and Lopressor for now. Follow-up CBC and BMET.  2. Hyperlipidemia by history. He is not on statin therapy, LDL was only 69 last year.  3. Essential hypertension, blood pressure control is good today.  Current medicines were reviewed with the patient today.   Orders Placed This Encounter  Procedures  . Basic metabolic panel  . CBC    Disposition: FU with me in 6 months.   Signed, Satira Sark, MD, [facility] 08/28/2015 2:38 PM    [facility] [place], [place], [place] [address] Phone: [phone]; Fax: [phone]

## 2015-08-29 ENCOUNTER — Other Ambulatory Visit (INDEPENDENT_AMBULATORY_CARE_PROVIDER_SITE_OTHER): Payer: Medicare Other

## 2015-08-29 DIAGNOSIS — Z23 Encounter for immunization: Secondary | ICD-10-CM | POA: Diagnosis not present

## 2015-08-29 DIAGNOSIS — Z8546 Personal history of malignant neoplasm of prostate: Secondary | ICD-10-CM | POA: Diagnosis not present

## 2015-08-29 DIAGNOSIS — I480 Paroxysmal atrial fibrillation: Secondary | ICD-10-CM

## 2015-08-30 ENCOUNTER — Telehealth: Payer: Self-pay | Admitting: *Deleted

## 2015-08-30 LAB — BMP8+EGFR
BUN / CREAT RATIO: 17 (ref 10–22)
BUN: 26 mg/dL (ref 8–27)
CALCIUM: 9.5 mg/dL (ref 8.6–10.2)
CHLORIDE: 100 mmol/L (ref 96–106)
CO2: 26 mmol/L (ref 18–29)
Creatinine, Ser: 1.54 mg/dL — ABNORMAL HIGH (ref 0.76–1.27)
GFR calc Af Amer: 48 mL/min/{1.73_m2} — ABNORMAL LOW (ref >59)
GFR calc non Af Amer: 42 mL/min/{1.73_m2} — ABNORMAL LOW (ref >59)
GLUCOSE: 90 mg/dL (ref 65–99)
Potassium: 6 mmol/L (ref 3.5–5.2)
Sodium: 142 mmol/L (ref 134–144)

## 2015-08-30 LAB — CBC WITH DIFFERENTIAL/PLATELET
BASOS ABS: 0.1 10*3/uL (ref 0.0–0.2)
BASOS: 1 %
EOS (ABSOLUTE): 0.3 10*3/uL (ref 0.0–0.4)
Eos: 4 %
Hematocrit: 45.6 % (ref 37.5–51.0)
Hemoglobin: 15.3 g/dL (ref 12.6–17.7)
Immature Grans (Abs): 0 10*3/uL (ref 0.0–0.1)
Immature Granulocytes: 0 %
LYMPHS: 22 %
Lymphocytes Absolute: 1.6 10*3/uL (ref 0.7–3.1)
MCH: 30.2 pg (ref 26.6–33.0)
MCHC: 33.6 g/dL (ref 31.5–35.7)
MCV: 90 fL (ref 79–97)
MONOS ABS: 0.6 10*3/uL (ref 0.1–0.9)
Monocytes: 8 %
NEUTROS ABS: 4.8 10*3/uL (ref 1.4–7.0)
NEUTROS PCT: 65 %
PLATELETS: 187 10*3/uL (ref 150–379)
RBC: 5.07 x10E6/uL (ref 4.14–5.80)
RDW: 13.9 % (ref 12.3–15.4)
WBC: 7.4 10*3/uL (ref 3.4–10.8)

## 2015-08-30 NOTE — Telephone Encounter (Signed)
Patient's wife informed

## 2015-08-30 NOTE — Telephone Encounter (Signed)
-----   Message from Satira Sark, MD sent at 08/30/2015 12:45 PM EST ----- Reviewed.hemoglobin and platelet count are normal. Creatinine 1.5. Continue current dose of Xarelto at 15 mg daily.

## 2015-08-31 LAB — PSA, TOTAL AND FREE
PROSTATE SPECIFIC AG, SERUM: 3.6 ng/mL (ref 0.0–4.0)
PSA, Free Pct: 11.9 %
PSA, Free: 0.43 ng/mL

## 2015-08-31 LAB — TESTOSTERONE,FREE AND TOTAL
TESTOSTERONE FREE: 3.6 pg/mL — AB (ref 6.6–18.1)
TESTOSTERONE: 310 ng/dL — AB (ref 348–1197)

## 2015-09-15 ENCOUNTER — Other Ambulatory Visit: Payer: Self-pay | Admitting: Family Medicine

## 2015-11-03 ENCOUNTER — Other Ambulatory Visit: Payer: Self-pay | Admitting: Family Medicine

## 2015-11-05 ENCOUNTER — Other Ambulatory Visit: Payer: Self-pay | Admitting: Family Medicine

## 2015-11-08 ENCOUNTER — Other Ambulatory Visit: Payer: Self-pay | Admitting: *Deleted

## 2015-11-08 MED ORDER — RIVAROXABAN 15 MG PO TABS: 15.0000 mg | ORAL_TABLET | Freq: Every day | ORAL | Status: DC

## 2015-11-20 ENCOUNTER — Ambulatory Visit (INDEPENDENT_AMBULATORY_CARE_PROVIDER_SITE_OTHER): Payer: Medicare Other | Admitting: Family Medicine

## 2015-11-20 ENCOUNTER — Encounter: Payer: Self-pay | Admitting: Family Medicine

## 2015-11-20 VITALS — BP 110/74 | HR 74 | Temp 97.0°F | Ht 71.0 in | Wt 177.2 lb

## 2015-11-20 DIAGNOSIS — E785 Hyperlipidemia, unspecified: Secondary | ICD-10-CM

## 2015-11-20 DIAGNOSIS — N183 Chronic kidney disease, stage 3 unspecified: Secondary | ICD-10-CM

## 2015-11-20 DIAGNOSIS — I480 Paroxysmal atrial fibrillation: Secondary | ICD-10-CM

## 2015-11-20 NOTE — Patient Instructions (Signed)
Thank you for allowing us to care for you today. We strive to provide exceptional quality and compassionate care. Please let us know how we are doing and how we can help serve you better by filling out the survey that you receive from [employer].     

## 2015-11-20 NOTE — Progress Notes (Signed)
Primary Physician: Wardell Honour, MD  Chief Complaint: 82 year old gentleman here to follow-up hypertension, atrial fib, he also has a history of prostate cancer that is followed by urology. He saw cardiologist and December. For his atrial fibrillation he controls rate with metoprolol and takes Xarelto as an anticoagulant. He does have chronic kidney disease stage IIIB. Weight is stable. Blood pressures are good on current regimen.     Past Medical History  Diagnosis Date  . Hyperlipidemia   . Anemia   . History of gout   . AAA (abdominal aortic aneurysm) ([place])     Remote ~1994   . Cirrhosis ([place])   . PAD (peripheral artery disease) (HCC)     Stenting in lower extremities (no records)  . AVNRT (AV nodal re-entry tachycardia) ([place])     Possible  . Atrial fibrillation [facility])     Diagnosed December 2014  . Prostate cancer ([place])     XRT; in remission  . Type 2 diabetes mellitus ([place])   . History of GI bleed   . History of hepatitis   . History of stroke   . Arthritis   . CKD (chronic kidney disease) stage 3, GFR 30-59 ml/min   . Gout   . Cataract     Bilateral  . Essential hypertension, benign      Home Meds: Prior to Admission medications   Medication Sig Start Date End Date Taking? Authorizing Provider  albuterol (PROVENTIL HFA;VENTOLIN HFA) 108 (90 BASE) MCG/ACT inhaler Inhale 1-2 puffs into the lungs every 6 (six) hours as needed for wheezing or shortness of breath. 05/22/15  Yes Wardell Honour, MD  allopurinol (ZYLOPRIM) 100 MG tablet Take 1 tablet by mouth  every day 11/04/15  Yes Wardell Honour, MD  Calcium Carbonate-Vitamin D (CALTRATE 600+D) 600-400 MG-UNIT per tablet Take 1 tablet by mouth daily.     Yes Historical Provider, MD  lisinopril (PRINIVIL,ZESTRIL) 10 MG tablet Take 1 tablet by mouth  every day 11/05/15  Yes Wardell Honour, MD  metoprolol tartrate (LOPRESSOR) 25 MG tablet Take one-half tablet by  mouth twice a day 08/06/15  Yes Satira Sark, MD   Multiple Vitamin (MULTIVITAMIN) tablet Take 1 tablet by mouth daily.   Yes Historical Provider, MD  Rivaroxaban (XARELTO) 15 MG TABS tablet Take 1 tablet (15 mg total) by mouth daily with supper. 11/08/15  Yes Satira Sark, MD    Allergies:  Allergies  Allergen Reactions  . Morphine Other (See Comments)    hallucinate    Social History   Social History  . Marital Status: Married    Spouse Name: N/A  . Number of Children: N/A  . Years of Education: N/A   Occupational History  . Not on file.   Social History Main Topics  . Smoking status: Former Smoker -- 0.50 packs/day for 70 years    Types: Cigarettes    Start date: 09/11/1944    Quit date: 08/21/2013  . Smokeless tobacco: Former Systems developer    Types: Snuff, Chew  . Alcohol Use: 0.0 oz/week    0 Standard drinks or equivalent per week     Comment: 08/22/2013 "drink ~ 1 beer and wine/yr"  . Drug Use: No  . Sexual Activity: No   Other Topics Concern  . Not on file   Social History Narrative   From [place]. Relocated to this area from [facility].     Review of Systems: Constitutional: negative for chills, fever, night sweats, weight  changes, or fatigue  HEENT: negative for vision changes,  Has hearing loss, congestion, rhinorrhea, ST, epistaxis, or sinus pressure Cardiovascular: negative for chest pain or palpitations Respiratory: negative for hemoptysis, wheezing, shortness of breath, or cough Abdominal: negative for abdominal pain, nausea, vomiting, diarrhea, or constipation Dermatological: negative for rash Neurologic: negative for headache, dizziness, or syncope All other systems reviewed and are otherwise negative with the exception to those above and in the HPI.   Physical Exam: Blood pressure 110/74, pulse 74, temperature 97 F (36.1 C), temperature source Oral, height '5\' 11"'$  (1.803 m), weight 177 lb 3.2 oz (80.377 kg)., Body mass index is 24.73 kg/(m^2). General: Well developed, well nourished, in no  acute distress. Head: Normocephalic, atraumatic, eyes without discharge, sclera non-icteric, nares are without discharge. Bilateral auditory canals clear, TM's are without perforation, pearly grey and translucent with reflective cone of light bilaterally. Oral cavity moist, posterior pharynx without exudate, erythema, peritonsillar abscess, or post nasal drip.  Neck: Supple. No thyromegaly. Full ROM. No lymphadenopathy. Lungs: Clear bilaterally to auscultation without wheezes, rales, or rhonchi. Breathing is unlabored. Heart: RRR with S1 S2(but does have AF). No murmurs, rubs, or gallops appreciated. Abdomen: Soft, non-tender, non-distended with normoactive bowel sounds. No hepatomegaly. No rebound/guarding. No obvious abdominal masses. Msk:  Strength and tone normal for age. Extremities/Skin: Warm and dry. No clubbing or cyanosis. No edema. No rashes or suspicious lesions. Neuro: Alert and oriented X 3. Moves all extremities spontaneously. Gait is normal. CNII-XII grossly in tact. Psych:  Responds to questions appropriately with a normal affect.   Labs: lipids   ASSESSMENT AND PLAN:  1. Hyperlipidemia Not on statin but plan to follow. LDL was 53 when last checked but that was almost 3 years ago. Lipids were checked 1 year ago but did not have LDL available. - Lipid panel   2. Paroxysmal atrial fibrillation (HCC) To be in sinus rhythm today denies palpitations  3. CKD (chronic kidney disease) stage 3, GFR 30-59 ml/min No function is stable. I believe he is followed by nephrology as well.  Wardell Honour MD  11/20/2015 8:03 AM

## 2015-11-21 LAB — LIPID PANEL
CHOLESTEROL TOTAL: 208 mg/dL — AB (ref 100–199)
Chol/HDL Ratio: 3.8 ratio units (ref 0.0–5.0)
HDL: 55 mg/dL (ref >39)
LDL Calculated: 120 mg/dL — ABNORMAL HIGH (ref 0–99)
TRIGLYCERIDES: 165 mg/dL — AB (ref 0–149)
VLDL Cholesterol Cal: 33 mg/dL (ref 5–40)

## 2016-01-13 ENCOUNTER — Other Ambulatory Visit: Payer: Self-pay | Admitting: Family Medicine

## 2016-01-14 ENCOUNTER — Other Ambulatory Visit: Payer: Self-pay | Admitting: Family Medicine

## 2016-03-05 ENCOUNTER — Other Ambulatory Visit: Payer: Self-pay | Admitting: Urology

## 2016-03-05 ENCOUNTER — Other Ambulatory Visit: Payer: Medicare Other

## 2016-03-05 DIAGNOSIS — Z8546 Personal history of malignant neoplasm of prostate: Secondary | ICD-10-CM | POA: Diagnosis not present

## 2016-03-06 LAB — PSA: PROSTATE SPECIFIC AG, SERUM: 9.9 ng/mL — AB (ref 0.0–4.0)

## 2016-03-06 LAB — TESTOSTERONE,FREE AND TOTAL
TESTOSTERONE FREE: 4.3 pg/mL — AB (ref 6.6–18.1)
TESTOSTERONE: 277 ng/dL — AB (ref 348–1197)

## 2016-03-13 ENCOUNTER — Ambulatory Visit (INDEPENDENT_AMBULATORY_CARE_PROVIDER_SITE_OTHER): Payer: Medicare Other | Admitting: Urology

## 2016-03-13 DIAGNOSIS — E291 Testicular hypofunction: Secondary | ICD-10-CM

## 2016-03-13 DIAGNOSIS — C61 Malignant neoplasm of prostate: Secondary | ICD-10-CM

## 2016-03-16 ENCOUNTER — Encounter: Payer: Self-pay | Admitting: Cardiology

## 2016-03-16 ENCOUNTER — Ambulatory Visit (INDEPENDENT_AMBULATORY_CARE_PROVIDER_SITE_OTHER): Payer: Medicare Other | Admitting: Cardiology

## 2016-03-16 VITALS — BP 110/62 | HR 65 | Ht 72.0 in | Wt 172.0 lb

## 2016-03-16 DIAGNOSIS — I10 Essential (primary) hypertension: Secondary | ICD-10-CM

## 2016-03-16 DIAGNOSIS — I480 Paroxysmal atrial fibrillation: Secondary | ICD-10-CM | POA: Diagnosis not present

## 2016-03-16 DIAGNOSIS — Z7901 Long term (current) use of anticoagulants: Secondary | ICD-10-CM | POA: Diagnosis not present

## 2016-03-16 DIAGNOSIS — I739 Peripheral vascular disease, unspecified: Secondary | ICD-10-CM

## 2016-03-16 DIAGNOSIS — N183 Chronic kidney disease, stage 3 (moderate): Secondary | ICD-10-CM

## 2016-03-16 MED ORDER — LISINOPRIL 5 MG PO TABS: 5.0000 mg | ORAL_TABLET | Freq: Every day | ORAL | Status: DC

## 2016-03-16 NOTE — Patient Instructions (Signed)
Medication Instructions:  Decrease Lisinopril to '5mg'$  daily - may take 1/2 tab of the '10mg'$  tablet that you already have.  Wife will call when needing refill.   Labwork:  CBC, BMET - orders given today, will do at primary MD office.  Office will contact with results via phone or letter.    Testing/Procedures: NONE  Follow-Up: Your physician wants you to follow up in: 6 months.  You will receive a reminder letter in the mail one-two months in advance.  If you don't receive a letter, please call our office to schedule the follow up appointment    Any Other Special Instructions Will Be Listed Below (If Applicable).  If you need a refill on your cardiac medications before your next appointment, please call your pharmacy.

## 2016-03-16 NOTE — Progress Notes (Signed)
Cardiology Office Note  Date: 03/16/2016   ID: LINVILLE Mullins, DOB 1930-11-07, MRN [id number]  PCP: Wardell Honour, MD  Primary Cardiologist: Rozann Lesches, MD   Chief Complaint  Patient presents with  . PAF    History of Present Illness: Erik Mullins is an 82 y.o. male last seen in December 2016.He is here today with his wife for a routine follow-up visit. Reports no significant palpitations or chest pain. Still enjoys playing golf, he is out on the course almost every day of the week.  We reviewed his medications which are outlined below. Also reviewed his home blood pressure checks, he has had some hypotensive events. We discussed reducing his lisinopril dose. Otherwise no bleeding problems on Xarelto.  Last CBC and BMET were in December 2016. Follow-up ECG today shows sinus rhythm with nonspecific T-wave changes.  Past Medical History  Diagnosis Date  . Hyperlipidemia   . Anemia   . History of gout   . AAA (abdominal aortic aneurysm) ([place])     Remote ~1994   . Cirrhosis ([place])   . PAD (peripheral artery disease) (HCC)     Stenting in lower extremities (no records)  . AVNRT (AV nodal re-entry tachycardia) ([place])     Possible  . Atrial fibrillation [facility])     Diagnosed December 2014  . Prostate cancer ([place])     XRT; in remission  . Type 2 diabetes mellitus ([place])   . History of GI bleed   . History of hepatitis   . History of stroke   . Arthritis   . CKD (chronic kidney disease) stage 3, GFR 30-59 ml/min   . Gout   . Cataract     Bilateral  . Essential hypertension, benign     Past Surgical History  Procedure Laterality Date  . Abdominal aortic aneurysm repair  1980's    in [place]  . Partial colectomy      "I was bleeding to death inside; took out 2/3 of my colon" (08/22/2013)  . Cholecystectomy    . Colon surgery    . Appendectomy    . Cataract extraction w/ intraocular lens  implant, bilateral Bilateral 2014  . Carotid endarterectomy Right ~  1999    Current Outpatient Prescriptions  Medication Sig Dispense Refill  . allopurinol (ZYLOPRIM) 100 MG tablet Take 1 tablet by mouth  every day 90 tablet 0  . Calcium Carbonate-Vitamin D (CALTRATE 600+D) 600-400 MG-UNIT per tablet Take 1 tablet by mouth daily.      Marland Kitchen lisinopril (PRINIVIL,ZESTRIL) 10 MG tablet Take 1 tablet by mouth  every day 90 tablet 1  . metoprolol tartrate (LOPRESSOR) 25 MG tablet Take one-half tablet by  mouth twice a day 90 tablet 3  . Multiple Vitamin (MULTIVITAMIN) tablet Take 1 tablet by mouth daily.    Marland Kitchen PROAIR HFA 108 (90 Base) MCG/ACT inhaler INHALE ONE TO TWO PUFFS BY MOUTH EVERY 6 HOURS AS NEEDED FOR WHEEZING FOR SHORTNESS OF BREATH 9 each 1  . Rivaroxaban (XARELTO) 15 MG TABS tablet Take 1 tablet (15 mg total) by mouth daily with supper. 90 tablet 3   No current facility-administered medications for this visit.   Allergies:  Morphine   Social History: The patient  reports that he quit smoking about 2 years ago. His smoking use included Cigarettes. He started smoking about 71 years ago. He has a 35 pack-year smoking history. He has quit using smokeless tobacco. His smokeless tobacco use included Snuff  and Chew. He reports that he drinks alcohol. He reports that he does not use illicit drugs.   ROS:  Please see the history of present illness. Otherwise, complete review of systems is positive for decreased hearing.  All other systems are reviewed and negative.   Physical Exam: VS:  BP 110/62 mmHg  Pulse 65  Ht 6' (1.829 m)  Wt 172 lb (78.019 kg)  BMI 23.32 kg/m2  SpO2 92%, BMI Body mass index is 23.32 kg/(m^2).  Wt Readings from Last 3 Encounters:  03/16/16 172 lb (78.019 kg)  11/20/15 177 lb 3.2 oz (80.377 kg)  08/28/15 177 lb (80.287 kg)    Elderly male, appears comfortable at rest.  HEENT: Conjunctiva and lids normal, oropharynx clear.  Neck: Supple, no elevated JVP or loud carotid bruits.  Lungs: Clear to auscultation, nonlabored.   Cardiac: Regular rate and rhythm, no S3.  Abdomen: Soft, nontender, bowel sounds present.  Skin: Scattered tattoos, no ulcerations.  Extremities: No pitting edema, distal pulses 1-2+.  Musculoskeletal: No kyphosis. Neuropsychiatric: Alert and oriented 3, affect appropriate.  ECG: I personally reviewed the tracing from 02/12/2015 which showed sinus rhythm with sinus arrhythmia.  Recent Labwork: 08/29/2015: BUN 26; Creatinine, Ser 1.54*; Platelets 187; Potassium 6.0*; Sodium 142     Component Value Date/Time   CHOL 208* 11/20/2015 0820   CHOL 147 10/24/2013 0909   CHOL 122 02/07/2013 0903   TRIG 165* 11/20/2015 0820   TRIG 168* 10/24/2013 0909   TRIG 140 02/07/2013 0903   HDL 55 11/20/2015 0820   HDL 44 10/24/2013 0909   HDL 41 02/07/2013 0903   CHOLHDL 3.8 11/20/2015 0820   LDLCALC 120* 11/20/2015 0820   LDLCALC 69 10/24/2013 0909   LDLCALC 53 02/07/2013 0903    Other Studies Reviewed Today:  Echocardiogram 08/23/2013: Study Conclusions  Left ventricle: The cavity size was normal. Wall thickness was normal. Systolic function was normal. The estimated ejection fraction was in the range of 60% to 65%.  Assessment and Plan:  1. Paroxysmal atrial fibrillation, a symptomatic terms of palpitations. He remains in sinus rhythm today. Doing well on Xarelto and low-dose Lopressor. Follow-up CBC and BMET.  2. CKD stage III, last creatinine 1.5.  3. Intermittent hypotension with low-normal blood pressure on medical therapy. Reduce lisinopril to 5 mg daily.  4. PAD, no active claudication at this time.  Current medicines were reviewed with the patient today.   Orders Placed This Encounter  Procedures  . EKG 12-Lead    Disposition: Follow-up with me in 6 months.  Signed, Satira Sark, MD, [facility] 03/16/2016 11:03 AM    [facility] [place], [place], [place] [address] Phone: [phone]; Fax: [phone]

## 2016-03-18 ENCOUNTER — Other Ambulatory Visit: Payer: Self-pay | Admitting: Urology

## 2016-03-18 DIAGNOSIS — C61 Malignant neoplasm of prostate: Secondary | ICD-10-CM

## 2016-03-19 ENCOUNTER — Other Ambulatory Visit: Payer: Medicare Other

## 2016-03-19 DIAGNOSIS — I10 Essential (primary) hypertension: Secondary | ICD-10-CM | POA: Diagnosis not present

## 2016-03-19 DIAGNOSIS — Z7901 Long term (current) use of anticoagulants: Secondary | ICD-10-CM | POA: Diagnosis not present

## 2016-03-20 LAB — CBC
HEMOGLOBIN: 15.1 g/dL (ref 12.6–17.7)
Hematocrit: 45.9 % (ref 37.5–51.0)
MCH: 30.4 pg (ref 26.6–33.0)
MCHC: 32.9 g/dL (ref 31.5–35.7)
MCV: 92 fL (ref 79–97)
PLATELETS: 151 10*3/uL (ref 150–379)
RBC: 4.97 x10E6/uL (ref 4.14–5.80)
RDW: 15.2 % (ref 12.3–15.4)
WBC: 6.6 10*3/uL (ref 3.4–10.8)

## 2016-03-20 LAB — BASIC METABOLIC PANEL
BUN/Creatinine Ratio: 20 (ref 10–24)
BUN: 36 mg/dL — ABNORMAL HIGH (ref 8–27)
CALCIUM: 9 mg/dL (ref 8.6–10.2)
CHLORIDE: 102 mmol/L (ref 96–106)
CO2: 22 mmol/L (ref 18–29)
CREATININE: 1.77 mg/dL — AB (ref 0.76–1.27)
GFR calc Af Amer: 40 mL/min/{1.73_m2} — ABNORMAL LOW (ref >59)
GFR calc non Af Amer: 35 mL/min/{1.73_m2} — ABNORMAL LOW (ref >59)
GLUCOSE: 89 mg/dL (ref 65–99)
Potassium: 4.8 mmol/L (ref 3.5–5.2)
Sodium: 141 mmol/L (ref 134–144)

## 2016-03-25 ENCOUNTER — Encounter (HOSPITAL_COMMUNITY)
Admission: RE | Admit: 2016-03-25 | Discharge: 2016-03-25 | Disposition: A | Discharge status: Home / Self Care | Payer: Medicare Other | Source: Ambulatory Visit | Attending: Urology | Admitting: Urology

## 2016-03-25 ENCOUNTER — Encounter (HOSPITAL_COMMUNITY): Payer: Self-pay

## 2016-03-25 DIAGNOSIS — R93422 Abnormal radiologic findings on diagnostic imaging of left kidney: Secondary | ICD-10-CM | POA: Insufficient documentation

## 2016-03-25 DIAGNOSIS — R972 Elevated prostate specific antigen [PSA]: Secondary | ICD-10-CM | POA: Diagnosis not present

## 2016-03-25 DIAGNOSIS — C61 Malignant neoplasm of prostate: Secondary | ICD-10-CM | POA: Diagnosis not present

## 2016-03-25 MED ORDER — TECHNETIUM TC 99M MEDRONATE IV KIT
25.0000 | PACK | Freq: Once | INTRAVENOUS | Status: AC | PRN
  Administered 2016-03-25: 25 via INTRAVENOUS

## 2016-03-26 ENCOUNTER — Telehealth: Payer: Self-pay | Admitting: *Deleted

## 2016-03-26 NOTE — Telephone Encounter (Signed)
Patient informed via vm. Copy sent to PCP.

## 2016-03-26 NOTE — Telephone Encounter (Signed)
-----   Message from Satira Sark, MD sent at 03/24/2016  7:57 AM EDT ----- Results reviewed. Hemoglobin normal. Creatinine 1.7 from 1.5, no anticipated change in current Xarelto dose. A copy of this test should be forwarded to Wardell Honour, MD.

## 2016-03-30 ENCOUNTER — Ambulatory Visit (HOSPITAL_COMMUNITY)
Admission: RE | Admit: 2016-03-30 | Discharge: 2016-03-30 | Disposition: A | Discharge status: Home / Self Care | Payer: Medicare Other | Source: Ambulatory Visit | Attending: Urology | Admitting: Urology

## 2016-03-30 DIAGNOSIS — I723 Aneurysm of iliac artery: Secondary | ICD-10-CM | POA: Diagnosis not present

## 2016-03-30 DIAGNOSIS — C61 Malignant neoplasm of prostate: Secondary | ICD-10-CM | POA: Diagnosis not present

## 2016-03-30 DIAGNOSIS — N289 Disorder of kidney and ureter, unspecified: Secondary | ICD-10-CM | POA: Diagnosis not present

## 2016-03-30 DIAGNOSIS — I700 Atherosclerosis of aorta: Secondary | ICD-10-CM | POA: Insufficient documentation

## 2016-03-30 DIAGNOSIS — I2510 Atherosclerotic heart disease of native coronary artery without angina pectoris: Secondary | ICD-10-CM | POA: Diagnosis not present

## 2016-03-30 MED ORDER — IOPAMIDOL (ISOVUE-300) INJECTION 61%
80.0000 mL | Freq: Once | INTRAVENOUS | Status: AC | PRN
  Administered 2016-03-30: 80 mL via INTRAVENOUS

## 2016-04-24 ENCOUNTER — Ambulatory Visit (INDEPENDENT_AMBULATORY_CARE_PROVIDER_SITE_OTHER): Payer: Medicare Other | Admitting: Urology

## 2016-04-24 DIAGNOSIS — E291 Testicular hypofunction: Secondary | ICD-10-CM

## 2016-04-24 DIAGNOSIS — C61 Malignant neoplasm of prostate: Secondary | ICD-10-CM

## 2016-04-27 ENCOUNTER — Other Ambulatory Visit: Payer: Self-pay | Admitting: Family Medicine

## 2016-05-25 ENCOUNTER — Other Ambulatory Visit: Payer: Medicare Other

## 2016-05-25 DIAGNOSIS — C61 Malignant neoplasm of prostate: Secondary | ICD-10-CM | POA: Diagnosis not present

## 2016-05-26 LAB — TESTOSTERONE,FREE AND TOTAL
TESTOSTERONE FREE: 3.1 pg/mL — AB (ref 6.6–18.1)
TESTOSTERONE: 9 ng/dL — AB (ref 264–916)

## 2016-05-26 LAB — PSA, TOTAL AND FREE
PROSTATE SPECIFIC AG, SERUM: 2.1 ng/mL (ref 0.0–4.0)
PSA FREE PCT: 6.7 %
PSA FREE: 0.14 ng/mL

## 2016-05-28 ENCOUNTER — Encounter: Payer: Self-pay | Admitting: Family Medicine

## 2016-05-28 ENCOUNTER — Ambulatory Visit (INDEPENDENT_AMBULATORY_CARE_PROVIDER_SITE_OTHER): Payer: Medicare Other | Admitting: Family Medicine

## 2016-05-28 VITALS — BP 134/82 | HR 64 | Temp 96.9°F | Ht 72.0 in | Wt 169.0 lb

## 2016-05-28 DIAGNOSIS — E785 Hyperlipidemia, unspecified: Secondary | ICD-10-CM

## 2016-05-28 DIAGNOSIS — N183 Chronic kidney disease, stage 3 unspecified: Secondary | ICD-10-CM

## 2016-05-28 DIAGNOSIS — I480 Paroxysmal atrial fibrillation: Secondary | ICD-10-CM | POA: Diagnosis not present

## 2016-05-28 DIAGNOSIS — Z8546 Personal history of malignant neoplasm of prostate: Secondary | ICD-10-CM | POA: Diagnosis not present

## 2016-05-28 DIAGNOSIS — I10 Essential (primary) hypertension: Secondary | ICD-10-CM | POA: Diagnosis not present

## 2016-05-28 NOTE — Progress Notes (Signed)
Subjective:    Patient ID: Erik Mullins, male    DOB: Aug 29, 1931, 82 y.o.   MRN: [id number]  HPI Pt here for follow up and management of chronic medical problems which includes hyperlipidemia and hypertension. He is taking medications regularly. Blood pressures have been fluctuating. Recently got a new wrist cuff pressures are reading lower typically at home his pressures of been around 100/65. Pressure here today was 134/82. Back in July cardiologist had reduced his lisinopril from 10-5 mg. He is to see urologist Hyman Hopes for follow-up prostate. Looks like PSA has decreased from 9.9 to 2+   Patient Active Problem List   Diagnosis Date Noted  . Osteoporosis screening 05/16/2014  . Hyperkalemia, diminished renal excretion 04/06/2014  . Thrombocytopenia, unspecified ([place]) 10/24/2013  . Gout   . Anticoagulation management encounter 09/05/2013  . Atrial fibrillation ([place])   . History of prostate cancer 08/14/2013  . Carotid artery stenosis 06/20/2013  . Tobacco user 02/16/2013  . Hyperlipidemia 01/14/2010  . Essential hypertension, benign 01/14/2010  . PSVT 01/14/2010  . AAA 01/14/2010  . CKD (chronic kidney disease) stage 3, GFR 30-59 ml/min 01/14/2010   Outpatient Encounter Prescriptions as of 05/28/2016  Medication Sig  . allopurinol (ZYLOPRIM) 100 MG tablet Take 1 tablet by mouth  every day  . Calcium Carbonate-Vitamin D (CALTRATE 600+D) 600-400 MG-UNIT per tablet Take 1 tablet by mouth daily.    Marland Kitchen lisinopril (PRINIVIL,ZESTRIL) 5 MG tablet Take 1 tablet (5 mg total) by mouth daily.  . metoprolol tartrate (LOPRESSOR) 25 MG tablet Take one-half tablet by  mouth twice a day  . Multiple Vitamin (MULTIVITAMIN) tablet Take 1 tablet by mouth daily.  Marland Kitchen PROAIR HFA 108 (90 Base) MCG/ACT inhaler INHALE ONE TO TWO PUFFS BY MOUTH EVERY 6 HOURS AS NEEDED FOR WHEEZING FOR SHORTNESS OF BREATH  . Rivaroxaban (XARELTO) 15 MG TABS tablet Take 1 tablet (15 mg total) by mouth daily with supper.  .  [DISCONTINUED] lisinopril (PRINIVIL,ZESTRIL) 10 MG tablet Take 1 tablet by mouth  every day   No facility-administered encounter medications on file as of 05/28/2016.        Review of Systems  Constitutional: Negative.   HENT: Negative.   Eyes: Negative.   Respiratory: Negative.   Cardiovascular: Negative.        Low BP  Gastrointestinal: Negative.   Endocrine: Negative.   Genitourinary: Negative.   Musculoskeletal: Negative.   Skin: Negative.   Allergic/Immunologic: Negative.   Neurological: Negative.   Hematological: Negative.   Psychiatric/Behavioral: Negative.        Objective:   Physical Exam  Constitutional: He is oriented to person, place, and time. He appears well-developed and well-nourished.  Cardiovascular: Normal rate, regular rhythm and normal heart sounds.   Rhythm is very regular today. Does not sound like atrial fib  Pulmonary/Chest: Effort normal and breath sounds normal.  Musculoskeletal: He exhibits no edema.  Neurological: He is alert and oriented to person, place, and time.  Psychiatric: He has a normal mood and affect. His behavior is normal.   BP 134/82 (BP Location: Left Arm)   Pulse 64   Temp (!) 96.9 F (36.1 C) (Oral)   Ht 6' (1.829 m)   Wt 169 lb (76.7 kg)   BMI 22.92 kg/m         Assessment & Plan:  1. Hyperlipidemia Lipids were last assessed 6 months ago and LDL was at 120 with HDL 55 and normal triglycerides - Lipid panel  2. CKD (chronic  kidney disease) stage 3, GFR 30-59 ml/min This appears stable based on labs from cardiology  3. Paroxysmal atrial fibrillation (HCC) Rate is controlled with metoprolol patient denies any palpitations or chest pain  4. Essential hypertension, benign As noted above blood pressures are a little on the low side I have asked patient to bring his blood pressure instrument and to compare with ours here  - CMP14+EGFR  5. History of prostate cancer All  Wardell Honour MD of by urology.. It  appears from his most recent labs the PSA has decreased

## 2016-05-28 NOTE — Patient Instructions (Signed)
Medicare Annual Wellness Visit  [place] and the medical providers at [facility] strive to bring you the best medical care.  In doing so we not only want to address your current medical conditions and concerns but also to detect new conditions early and prevent illness, disease and health-related problems.    Medicare offers a yearly Wellness Visit which allows our clinical staff to assess your need for preventative services including immunizations, lifestyle education, counseling to decrease risk of preventable diseases and screening for fall risk and other medical concerns.    This visit is provided free of charge (no copay) for all Medicare recipients. The clinical pharmacists at [facility] have begun to conduct these Wellness Visits which will also include a thorough review of all your medications.    As you primary medical provider recommend that you make an appointment for your Annual Wellness Visit if you have not done so already this year.  You may set up this appointment before you leave today or you may call back ([phone]) and schedule an appointment.  Please make sure when you call that you mention that you are scheduling your Annual Wellness Visit with the clinical pharmacist so that the appointment may be made for the proper length of time.     Continue current medications. Continue good therapeutic lifestyle changes which include good diet and exercise. Fall precautions discussed with patient. If an FOBT was given today- please return it to our front desk. If you are over 50 years old - you may need Prevnar 13 or the adult Pneumonia vaccine.  **Flu shots are available--- please call and schedule a FLU-CLINIC appointment**  After your visit with us today you will receive a survey in the mail or online from [employer] regarding your care with us. Please take a moment to fill this out. Your feedback is very  important to us as you can help us better understand your patient needs as well as improve your experience and satisfaction. WE CARE ABOUT YOU!!!    

## 2016-05-29 ENCOUNTER — Ambulatory Visit (INDEPENDENT_AMBULATORY_CARE_PROVIDER_SITE_OTHER): Payer: Medicare Other | Admitting: Urology

## 2016-05-29 DIAGNOSIS — C61 Malignant neoplasm of prostate: Secondary | ICD-10-CM | POA: Diagnosis not present

## 2016-05-29 DIAGNOSIS — E291 Testicular hypofunction: Secondary | ICD-10-CM | POA: Diagnosis not present

## 2016-05-29 LAB — CMP14+EGFR
ALT: 14 IU/L (ref 0–44)
AST: 20 IU/L (ref 0–40)
Albumin/Globulin Ratio: 1.5 (ref 1.2–2.2)
Albumin: 4.3 g/dL (ref 3.5–4.7)
Alkaline Phosphatase: 97 IU/L (ref 39–117)
BUN/Creatinine Ratio: 17 (ref 10–24)
BUN: 27 mg/dL (ref 8–27)
Bilirubin Total: 0.3 mg/dL (ref 0.0–1.2)
CALCIUM: 9.3 mg/dL (ref 8.6–10.2)
CO2: 28 mmol/L (ref 18–29)
CREATININE: 1.57 mg/dL — AB (ref 0.76–1.27)
Chloride: 100 mmol/L (ref 96–106)
GFR calc Af Amer: 47 mL/min/{1.73_m2} — ABNORMAL LOW (ref >59)
GFR, EST NON AFRICAN AMERICAN: 40 mL/min/{1.73_m2} — AB (ref >59)
Globulin, Total: 2.8 g/dL (ref 1.5–4.5)
Glucose: 96 mg/dL (ref 65–99)
Potassium: 5.1 mmol/L (ref 3.5–5.2)
Sodium: 142 mmol/L (ref 134–144)
Total Protein: 7.1 g/dL (ref 6.0–8.5)

## 2016-05-29 LAB — LIPID PANEL
CHOL/HDL RATIO: 3.7 ratio (ref 0.0–5.0)
Cholesterol, Total: 209 mg/dL — ABNORMAL HIGH (ref 100–199)
HDL: 56 mg/dL (ref >39)
LDL CALC: 120 mg/dL — AB (ref 0–99)
TRIGLYCERIDES: 164 mg/dL — AB (ref 0–149)
VLDL CHOLESTEROL CAL: 33 mg/dL (ref 5–40)

## 2016-07-22 ENCOUNTER — Ambulatory Visit (INDEPENDENT_AMBULATORY_CARE_PROVIDER_SITE_OTHER): Payer: Medicare Other

## 2016-07-22 DIAGNOSIS — Z23 Encounter for immunization: Secondary | ICD-10-CM

## 2016-08-24 ENCOUNTER — Other Ambulatory Visit: Payer: Self-pay | Admitting: Family Medicine

## 2016-08-24 ENCOUNTER — Other Ambulatory Visit: Payer: Self-pay | Admitting: Cardiology

## 2016-10-21 ENCOUNTER — Other Ambulatory Visit: Payer: Self-pay | Admitting: *Deleted

## 2016-10-21 ENCOUNTER — Telehealth: Payer: Self-pay | Admitting: Family Medicine

## 2016-10-21 MED ORDER — METOPROLOL TARTRATE 25 MG PO TABS: 12.5000 mg | ORAL_TABLET | Freq: Two times a day (BID) | ORAL | 0 refills | Status: DC

## 2016-10-21 MED ORDER — RIVAROXABAN 15 MG PO TABS: 15.0000 mg | ORAL_TABLET | Freq: Every day | ORAL | 0 refills | Status: DC

## 2016-10-22 MED ORDER — ALLOPURINOL 100 MG PO TABS: 100.0000 mg | ORAL_TABLET | Freq: Every day | ORAL | 0 refills | Status: DC

## 2016-10-22 NOTE — Telephone Encounter (Signed)
done

## 2016-11-26 ENCOUNTER — Ambulatory Visit: Payer: Medicare Other | Admitting: Family Medicine

## 2016-11-27 ENCOUNTER — Encounter: Payer: Self-pay | Admitting: Family Medicine

## 2016-11-27 ENCOUNTER — Ambulatory Visit (INDEPENDENT_AMBULATORY_CARE_PROVIDER_SITE_OTHER): Payer: Medicare Other | Admitting: Family Medicine

## 2016-11-27 VITALS — BP 119/71 | HR 68 | Temp 96.8°F | Ht 72.0 in | Wt 174.0 lb

## 2016-11-27 DIAGNOSIS — Z119 Encounter for screening for infectious and parasitic diseases, unspecified: Secondary | ICD-10-CM | POA: Diagnosis not present

## 2016-11-27 DIAGNOSIS — E7800 Pure hypercholesterolemia, unspecified: Secondary | ICD-10-CM | POA: Diagnosis not present

## 2016-11-27 DIAGNOSIS — I480 Paroxysmal atrial fibrillation: Secondary | ICD-10-CM | POA: Diagnosis not present

## 2016-11-27 DIAGNOSIS — N183 Chronic kidney disease, stage 3 unspecified: Secondary | ICD-10-CM

## 2016-11-27 DIAGNOSIS — I10 Essential (primary) hypertension: Secondary | ICD-10-CM | POA: Diagnosis not present

## 2016-11-27 DIAGNOSIS — Z8546 Personal history of malignant neoplasm of prostate: Secondary | ICD-10-CM | POA: Diagnosis not present

## 2016-11-27 LAB — URINALYSIS, COMPLETE
Bilirubin, UA: NEGATIVE
GLUCOSE, UA: NEGATIVE
Leukocytes, UA: NEGATIVE
NITRITE UA: NEGATIVE
PH UA: 5 (ref 5.0–7.5)
Protein, UA: NEGATIVE
RBC, UA: NEGATIVE
Specific Gravity, UA: 1.02 (ref 1.005–1.030)
UUROB: 0.2 mg/dL (ref 0.2–1.0)

## 2016-11-27 LAB — MICROSCOPIC EXAMINATION
BACTERIA UA: NONE SEEN
EPITHELIAL CELLS (NON RENAL): NONE SEEN /HPF (ref 0–10)
RBC MICROSCOPIC, UA: NONE SEEN /HPF (ref 0–?)
RENAL EPITHEL UA: NONE SEEN /HPF
WBC UA: NONE SEEN /HPF (ref 0–?)

## 2016-11-27 MED ORDER — ALBUTEROL SULFATE HFA 108 (90 BASE) MCG/ACT IN AERS: INHALATION_SPRAY | RESPIRATORY_TRACT | 11 refills | Status: DC

## 2016-11-27 NOTE — Progress Notes (Signed)
 Subjective:    Patient ID: Erik Mullins, male    DOB: 05/08/1931, 83 y.o.   MRN: [id number]  HPI Pt here for follow up and management of chronic medical problems which includes hyperlipidemia and hypertension. He is taking medication regularly. Patient is worried about low blood pressure. Many of self monitor readings include pressures below 100 systolic and in the 50s diastolic. He takes metoprolol for rate control with atrial fib and lisinopril which was decreased from 10 mg to 5 mg for hypotension.  Patient Active Problem List   Diagnosis Date Noted  . Osteoporosis screening 05/16/2014  . Hyperkalemia, diminished renal excretion 04/06/2014  . Thrombocytopenia, unspecified 10/24/2013  . Gout   . Anticoagulation management encounter 09/05/2013  . Atrial fibrillation (HCC)   . History of prostate cancer 08/14/2013  . Carotid artery stenosis 06/20/2013  . Tobacco user 02/16/2013  . Hyperlipidemia 01/14/2010  . Essential hypertension, benign 01/14/2010  . PSVT 01/14/2010  . AAA 01/14/2010  . CKD (chronic kidney disease) stage 3, GFR 30-59 ml/min 01/14/2010   Outpatient Encounter Prescriptions as of 11/27/2016  Medication Sig  . albuterol (PROAIR HFA) 108 (90 Base) MCG/ACT inhaler INHALE ONE TO TWO PUFFS BY MOUTH EVERY 6 HOURS AS NEEDED FOR WHEEZING FOR SHORTNESS OF BREATH  . allopurinol (ZYLOPRIM) 100 MG tablet Take 1 tablet (100 mg total) by mouth daily.  . Calcium Carbonate-Vitamin D (CALTRATE 600+D) 600-400 MG-UNIT per tablet Take 1 tablet by mouth daily.    . lisinopril (PRINIVIL,ZESTRIL) 5 MG tablet Take 1 tablet (5 mg total) by mouth daily.  . metoprolol tartrate (LOPRESSOR) 25 MG tablet Take 0.5 tablets (12.5 mg total) by mouth 2 (two) times daily.  . Multiple Vitamin (MULTIVITAMIN) tablet Take 1 tablet by mouth daily.  . Rivaroxaban (XARELTO) 15 MG TABS tablet Take 1 tablet (15 mg total) by mouth daily with supper.  . [DISCONTINUED] PROAIR HFA 108 (90 Base) MCG/ACT inhaler  INHALE ONE TO TWO PUFFS BY MOUTH EVERY 6 HOURS AS NEEDED FOR WHEEZING FOR SHORTNESS OF BREATH  . [DISCONTINUED] lisinopril (PRINIVIL,ZESTRIL) 10 MG tablet TAKE 1 TABLET BY MOUTH  EVERY DAY   No facility-administered encounter medications on file as of 11/27/2016.       Review of Systems  HENT: Negative.   Eyes: Negative.   Respiratory: Negative.   Cardiovascular: Negative.        Some low BP readings  Gastrointestinal: Negative.   Endocrine: Negative.   Genitourinary: Negative.   Musculoskeletal: Negative.   Skin: Negative.   Allergic/Immunologic: Negative.   Neurological: Positive for weakness.  Hematological: Negative.   Psychiatric/Behavioral: Negative.        Objective:   Physical Exam  Constitutional: He is oriented to person, place, and time. He appears well-developed and well-nourished.  Cardiovascular: Normal rate, regular rhythm and normal heart sounds.   Pulmonary/Chest: Effort normal and breath sounds normal.  Neurological: He is alert and oriented to person, place, and time.   BP 119/71 (BP Location: Left Arm)   Pulse 68   Temp (!) 96.8 F (36 C) (Oral)   Ht 6' (1.829 m)   Wt 174 lb (78.9 kg)   BMI 23.60 kg/m         Assessment & Plan:  1. Pure hypercholesterolemia [facility] Do not need to duplicate labs - Lipid panel  2. Paroxysmal atrial fibrillation (HCC) Would appear to be in sinus rhythm today - CMP14+EGFR  3. Essential hypertension, benign Since blood pressures have   been a little low I would recommend stopping the lisinopril and continuing with metoprolol only - CMP14+EGFR  4. CKD (chronic kidney disease) stage 3, GFR 30-59 ml/min   5. History of prostate cancer Stable problem - PSA, total and free - Testosterone,Free and Total - Urinalysis, Complete  6. Screening examination for infectious disease Patient had hepatitis back in the 50s. He wonders what kind of hepatitis C had. My guess would be a week can do  screening antibodies  Stephen M Miller MD - Hepatitis panel, acute  

## 2016-11-27 NOTE — Patient Instructions (Signed)
Medicare Annual Wellness Visit  [place] and the medical providers at [facility] strive to bring you the best medical care.  In doing so we not only want to address your current medical conditions and concerns but also to detect new conditions early and prevent illness, disease and health-related problems.    Medicare offers a yearly Wellness Visit which allows our clinical staff to assess your need for preventative services including immunizations, lifestyle education, counseling to decrease risk of preventable diseases and screening for fall risk and other medical concerns.    This visit is provided free of charge (no copay) for all Medicare recipients. The clinical pharmacists at [facility] have begun to conduct these Wellness Visits which will also include a thorough review of all your medications.    As you primary medical provider recommend that you make an appointment for your Annual Wellness Visit if you have not done so already this year.  You may set up this appointment before you leave today or you may call back ([phone]) and schedule an appointment.  Please make sure when you call that you mention that you are scheduling your Annual Wellness Visit with the clinical pharmacist so that the appointment may be made for the proper length of time.     Continue current medications. Continue good therapeutic lifestyle changes which include good diet and exercise. Fall precautions discussed with patient. If an FOBT was given today- please return it to our front desk. If you are over 50 years old - you may need Prevnar 13 or the adult Pneumonia vaccine.  **Flu shots are available--- please call and schedule a FLU-CLINIC appointment**  After your visit with us today you will receive a survey in the mail or online from [employer] regarding your care with us. Please take a moment to fill this out. Your feedback is very  important to us as you can help us better understand your patient needs as well as improve your experience and satisfaction. WE CARE ABOUT YOU!!!    

## 2016-11-28 LAB — CMP14+EGFR
ALK PHOS: 90 IU/L (ref 39–117)
ALT: 15 IU/L (ref 0–44)
AST: 21 IU/L (ref 0–40)
Albumin/Globulin Ratio: 1.8 (ref 1.2–2.2)
Albumin: 4.2 g/dL (ref 3.5–4.7)
BUN/Creatinine Ratio: 20 (ref 10–24)
BUN: 31 mg/dL — AB (ref 8–27)
Bilirubin Total: <0.2 mg/dL (ref 0.0–1.2)
CALCIUM: 8.9 mg/dL (ref 8.6–10.2)
CO2: 27 mmol/L (ref 18–29)
CREATININE: 1.54 mg/dL — AB (ref 0.76–1.27)
Chloride: 101 mmol/L (ref 96–106)
GFR calc Af Amer: 48 mL/min/{1.73_m2} — ABNORMAL LOW (ref >59)
GFR, EST NON AFRICAN AMERICAN: 41 mL/min/{1.73_m2} — AB (ref >59)
GLUCOSE: 137 mg/dL — AB (ref 65–99)
Globulin, Total: 2.4 g/dL (ref 1.5–4.5)
Potassium: 4.8 mmol/L (ref 3.5–5.2)
SODIUM: 143 mmol/L (ref 134–144)
Total Protein: 6.6 g/dL (ref 6.0–8.5)

## 2016-11-28 LAB — PSA, TOTAL AND FREE
PSA FREE PCT: 13.3 %
PSA, Free: 0.04 ng/mL
Prostate Specific Ag, Serum: 0.3 ng/mL (ref 0.0–4.0)

## 2016-11-28 LAB — TESTOSTERONE,FREE AND TOTAL
Testosterone, Free: 2.2 pg/mL — ABNORMAL LOW (ref 6.6–18.1)
Testosterone: 6 ng/dL — ABNORMAL LOW (ref 264–916)

## 2016-11-28 LAB — LIPID PANEL
CHOL/HDL RATIO: 4.2 ratio (ref 0.0–5.0)
Cholesterol, Total: 205 mg/dL — ABNORMAL HIGH (ref 100–199)
HDL: 49 mg/dL (ref >39)
LDL Calculated: 102 mg/dL — ABNORMAL HIGH (ref 0–99)
TRIGLYCERIDES: 271 mg/dL — AB (ref 0–149)
VLDL CHOLESTEROL CAL: 54 mg/dL — AB (ref 5–40)

## 2016-11-28 LAB — HEPATITIS PANEL, ACUTE
HEP A IGM: NEGATIVE
HEP B C IGM: NEGATIVE
HEP B S AG: NEGATIVE
Hep C Virus Ab: <0.1 s/co ratio (ref 0.0–0.9)

## 2016-12-04 ENCOUNTER — Ambulatory Visit (INDEPENDENT_AMBULATORY_CARE_PROVIDER_SITE_OTHER): Payer: Medicare Other | Admitting: Urology

## 2016-12-04 DIAGNOSIS — C61 Malignant neoplasm of prostate: Secondary | ICD-10-CM | POA: Diagnosis not present

## 2016-12-04 DIAGNOSIS — E291 Testicular hypofunction: Secondary | ICD-10-CM | POA: Diagnosis not present

## 2017-01-14 NOTE — Progress Notes (Signed)
Cardiology Office Note  Date: 01/15/2017   ID: Erik Mullins, DOB September 19, 1930, MRN [id number]  PCP: Wardell Honour, MD  Primary Cardiologist: Rozann Lesches, MD   Chief Complaint  Patient presents with  . PAF    History of Present Illness: Erik Mullins is an 83 y.o. male last seen in July 2017. He is here today with his wife for a routine follow-up visit. States that he has been enjoying golf in the last week or so. Reports NYHA class II dyspnea with most activities, no angina symptoms or palpitations.  I reviewed his most recent lab work which is outlined below. He does not report any bleeding problems, on renal dose Xarelto. I personally reviewed his ECG today which shows normal sinus rhythm.  He is not reporting any recent dizziness. We have reduced his lisinopril.  Past Medical History:  Diagnosis Date  . AAA (abdominal aortic aneurysm) ([place])    Remote ~1994   . Anemia   . Arthritis   . Atrial fibrillation [facility])    Diagnosed December 2014  . AVNRT (AV nodal re-entry tachycardia) ([place])    Possible  . Cataract    Bilateral  . Cirrhosis ([place])   . CKD (chronic kidney disease) stage 3, GFR 30-59 ml/min   . Essential hypertension, benign   . Gout   . History of GI bleed   . History of gout   . History of hepatitis   . History of stroke   . Hyperlipidemia   . PAD (peripheral artery disease) (HCC)    Stenting in lower extremities (no records)  . Prostate cancer ([place])    XRT; in remission  . Type 2 diabetes mellitus ([place])     Past Surgical History:  Procedure Laterality Date  . ABDOMINAL AORTIC ANEURYSM REPAIR  1980's   in [place]  . APPENDECTOMY    . CAROTID ENDARTERECTOMY Right ~ 1999  . CATARACT EXTRACTION W/ INTRAOCULAR LENS  IMPLANT, BILATERAL Bilateral 2014  . CHOLECYSTECTOMY    . COLON SURGERY    . PARTIAL COLECTOMY     "I was bleeding to death inside; took out 2/3 of my colon" (08/22/2013)    Current Outpatient Prescriptions  Medication  Sig Dispense Refill  . albuterol (PROAIR HFA) 108 (90 Base) MCG/ACT inhaler INHALE ONE TO TWO PUFFS BY MOUTH EVERY 6 HOURS AS NEEDED FOR WHEEZING FOR SHORTNESS OF BREATH 9 each 11  . allopurinol (ZYLOPRIM) 100 MG tablet Take 1 tablet (100 mg total) by mouth daily. 90 tablet 0  . Calcium Carbonate-Vitamin D (CALTRATE 600+D) 600-400 MG-UNIT per tablet Take 1 tablet by mouth daily.      . metoprolol tartrate (LOPRESSOR) 25 MG tablet Take 0.5 tablets (12.5 mg total) by mouth 2 (two) times daily. 90 tablet 0  . Multiple Vitamin (MULTIVITAMIN) tablet Take 1 tablet by mouth daily.    . Rivaroxaban (XARELTO) 15 MG TABS tablet Take 1 tablet (15 mg total) by mouth daily with supper. 90 tablet 0   No current facility-administered medications for this visit.    Allergies:  Morphine   Social History: The patient  reports that he quit smoking about 3 years ago. His smoking use included Cigarettes. He started smoking about 72 years ago. He has a 35.00 pack-year smoking history. He has quit using smokeless tobacco. His smokeless tobacco use included Snuff and Chew. He reports that he drinks alcohol. He reports that he does not use drugs.   ROS:  Please see  the history of present illness. Otherwise, complete review of systems is positive for hearing loss.  All other systems are reviewed and negative.   Physical Exam: VS:  BP 122/80   Pulse 66   Ht '5\' 11"'$  (1.803 m)   Wt 169 lb (76.7 kg)   SpO2 92%   BMI 23.57 kg/m , BMI Body mass index is 23.57 kg/m.  Wt Readings from Last 3 Encounters:  01/15/17 169 lb (76.7 kg)  11/27/16 174 lb (78.9 kg)  05/28/16 169 lb (76.7 kg)    Elderly male, appears comfortable at rest.  HEENT: Conjunctiva and lids normal, oropharynx clear.  Neck: Supple, no elevated JVP or loud carotid bruits.  Lungs: Clear to auscultation, nonlabored.  Cardiac: Regular rate and rhythm, no S3.  Abdomen: Soft, nontender, bowel sounds present.  Skin: Scattered tattoos, no ulcerations.   Extremities: No pitting edema, distal pulses 1-2+.  Musculoskeletal: No kyphosis. Neuropsychiatric: Alert and oriented 3, affect appropriate.  ECG: I personally reviewed the tracing from 03/16/2016 which showed sinus rhythm with nonspecific T-wave changes.  Recent Labwork: 03/19/2016: Platelets 151 11/27/2016: ALT 15; AST 21; BUN 31; Creatinine, Ser 1.54; Potassium 4.8; Sodium 143     Component Value Date/Time   CHOL 205 (H) 11/27/2016 1455   CHOL 122 02/07/2013 0903   TRIG 271 (H) 11/27/2016 1455   TRIG 168 (H) 10/24/2013 0909   TRIG 140 02/07/2013 0903   HDL 49 11/27/2016 1455   HDL 44 10/24/2013 0909   HDL 41 02/07/2013 0903   CHOLHDL 4.2 11/27/2016 1455   LDLCALC 102 (H) 11/27/2016 1455   LDLCALC 69 10/24/2013 0909   LDLCALC 53 02/07/2013 0903    Other Studies Reviewed Today:  Echocardiogram 08/23/2013: Study Conclusions  Left ventricle: The cavity size was normal. Wall thickness was normal. Systolic function was normal. The estimated ejection fraction was in the range of 60% to 65%.  Assessment and Plan:  1. Paroxysmal atrial fibrillation, well-controlled without active palpitations in sinus rhythm today by ECG. Continue Xarelto at renal dose for stroke prophylaxis. Also maintained on low-dose Lopressor.  2. CKD stage 3, recent creatinine 1.54.  3. History of PAD with previous percutaneous intervention, details not clear (another facility). No active claudication at this time.  4. Essential hypertension, blood pressure is normal today. We reduced his lisinopril dose previously.  Current medicines were reviewed with the patient today.   Orders Placed This Encounter  Procedures  . EKG 12-Lead    Disposition: Follow-up in 6 months.  Signed, Satira Sark, MD, [facility] 01/15/2017 4:42 PM    [facility] [place], [place], [place] [address] Phone: [phone]; Fax: [phone]

## 2017-01-15 ENCOUNTER — Encounter: Payer: Self-pay | Admitting: Cardiology

## 2017-01-15 ENCOUNTER — Ambulatory Visit (INDEPENDENT_AMBULATORY_CARE_PROVIDER_SITE_OTHER): Payer: Medicare Other | Admitting: Cardiology

## 2017-01-15 VITALS — BP 122/80 | HR 66 | Ht 71.0 in | Wt 169.0 lb

## 2017-01-15 DIAGNOSIS — N183 Chronic kidney disease, stage 3 unspecified: Secondary | ICD-10-CM

## 2017-01-15 DIAGNOSIS — I739 Peripheral vascular disease, unspecified: Secondary | ICD-10-CM

## 2017-01-15 DIAGNOSIS — I10 Essential (primary) hypertension: Secondary | ICD-10-CM | POA: Diagnosis not present

## 2017-01-15 DIAGNOSIS — I480 Paroxysmal atrial fibrillation: Secondary | ICD-10-CM

## 2017-01-15 NOTE — Patient Instructions (Signed)

## 2017-01-17 ENCOUNTER — Other Ambulatory Visit: Payer: Self-pay | Admitting: Family Medicine

## 2017-01-17 ENCOUNTER — Other Ambulatory Visit: Payer: Self-pay | Admitting: Cardiology

## 2017-01-30 ENCOUNTER — Other Ambulatory Visit: Payer: Self-pay | Admitting: Cardiology

## 2017-04-17 ENCOUNTER — Other Ambulatory Visit: Payer: Self-pay | Admitting: Cardiology

## 2017-04-17 ENCOUNTER — Other Ambulatory Visit: Payer: Self-pay | Admitting: Family Medicine

## 2017-05-01 ENCOUNTER — Other Ambulatory Visit: Payer: Self-pay | Admitting: Cardiology

## 2017-05-13 ENCOUNTER — Encounter: Payer: Self-pay | Admitting: Family Medicine

## 2017-05-13 ENCOUNTER — Ambulatory Visit (INDEPENDENT_AMBULATORY_CARE_PROVIDER_SITE_OTHER): Payer: Medicare Other | Admitting: Family Medicine

## 2017-05-13 ENCOUNTER — Ambulatory Visit (INDEPENDENT_AMBULATORY_CARE_PROVIDER_SITE_OTHER): Payer: Medicare Other

## 2017-05-13 VITALS — BP 150/90 | HR 65 | Temp 97.0°F | Ht 71.0 in | Wt 162.4 lb

## 2017-05-13 DIAGNOSIS — R0789 Other chest pain: Secondary | ICD-10-CM | POA: Diagnosis not present

## 2017-05-13 DIAGNOSIS — W19XXXA Unspecified fall, initial encounter: Secondary | ICD-10-CM

## 2017-05-13 DIAGNOSIS — R0781 Pleurodynia: Secondary | ICD-10-CM

## 2017-05-13 NOTE — Patient Instructions (Signed)
Great to meet you!  Come back as planned.   Try ice or heat, and tylenol for pain.   Call or come back with any concerns

## 2017-05-13 NOTE — Progress Notes (Signed)
   HPI  Patient presents today here with left rib pain after a fall.  Patient states that about one week ago he was playing golf when he fell. He had just typed letter under his left arm and he didn't over to pick up another club when he fell forward landing on his left lateral elbow. The left arm developed bruising as expected, however he has had continued rib and left side pain without any obvious bruising. He is concerned about rib fracture.  He denies any difficulty sleeping ago, sleeping, or any pain with deep inspiration or cough. He uses Tylenol for pain and avoids NSAIDs.   PMH: Smoking status noted ROS: Per HPI  Objective: BP (!) 150/90   Pulse 65   Temp (!) 97 F (36.1 C) (Oral)   Ht 5\' 11"  (1.803 m)   Wt 162 lb 6.4 oz (73.7 kg)   BMI 22.65 kg/m  Gen: NAD, alert, cooperative with exam HEENT: NCAT, EOMI, PERRL CV: RRR, good S1/S2, no murmur Chest wall:  Positive tenderness to palpation approximately 5 cm below left nipple in mid lenticular Over the bony prominence and in the intercostal area of the ribs. This was just posterior to the BB was taped to the patient for x-ray  Resp: CTABL, no wheezes, non-labored Ext: No edema, warm Neuro: Alert and oriented, No gross deficits  Review of rib series personally does not show any rib fractures to be appreciated.   Assessment and plan:  # Left-sided rib pain Likely simple confusion, reassurance provided, discussed supportive care Discussed 0 tolerance policy for head injury being treated with Xarelto follow-up as needed, patient has appointment with me in about 2 weeks to establish care as his previous PCP has retired      Orders Placed This Encounter  Procedures  . DG Ribs Unilateral W/Chest Left    Standing Status:   Future    Number of Occurrences:   1    Standing Expiration Date:   07/13/2018    Order Specific Question:   Reason for Exam (SYMPTOM  OR DIAGNOSIS REQUIRED)    Answer:   fall    Order Specific  Question:   Preferred imaging location?    Answer:   Internal    Order Specific Question:   Radiology Contrast Protocol - do NOT remove file path    Answer:   [email or web address]    No orders of the defined types were placed in this encounter.   Laroy Apple, MD [facility] 05/13/2017, 12:21 PM

## 2017-06-01 ENCOUNTER — Encounter: Payer: Self-pay | Admitting: Family Medicine

## 2017-06-01 ENCOUNTER — Ambulatory Visit (INDEPENDENT_AMBULATORY_CARE_PROVIDER_SITE_OTHER): Payer: Medicare Other | Admitting: Family Medicine

## 2017-06-01 VITALS — BP 149/92 | HR 59 | Temp 97.2°F | Ht 71.0 in | Wt 162.0 lb

## 2017-06-01 DIAGNOSIS — C61 Malignant neoplasm of prostate: Secondary | ICD-10-CM | POA: Insufficient documentation

## 2017-06-01 DIAGNOSIS — I480 Paroxysmal atrial fibrillation: Secondary | ICD-10-CM

## 2017-06-01 DIAGNOSIS — I10 Essential (primary) hypertension: Secondary | ICD-10-CM | POA: Diagnosis not present

## 2017-06-01 NOTE — Progress Notes (Signed)
   HPI  Patient presents today here for follow-up chronic medical conditions.  Hypertension Good medication compliance, patient has had more stress at home recently with his family moving from [place] and living with them temporarily. He has not been checking his blood pressure No chest pain or headaches.  Prostate cancer Status post radiation therapy, now on Lupron injections with urology, needs PSA and testosterone drawn  Atrial fibrillation Patient taking Xarelto daily, no bleeding that is unusual  PMH: Smoking status noted ROS: Per HPI  Objective: BP (!) 149/92   Pulse (!) 59   Temp (!) 97.2 F (36.2 C) (Oral)   Ht 5\' 11"  (1.803 m)   Wt 162 lb (73.5 kg)   BMI 22.59 kg/m  Gen: NAD, alert, cooperative with exam HEENT: NCAT CV: RRR, good S1/S2, no murmur Resp: CTABL, no wheezes, non-labored Ext: No edema, warm Neuro: Alert and oriented, No gross deficits  Assessment and plan:  # Hypertension Elevated today, however patient is under much more stress than usual at home, no changes for now   # Prostate cancer Status post radiation therapy, patient has follow-up with urology He is also having Lupron injections, PSA and testosterone drawn today  # Atrial fibrillation Rate controlled, anticoagulated with Xarelto CBC checked at [facility] about 1 month ago     Orders Placed This Encounter  Procedures  . PSA  . Testosterone    Laroy Apple, MD [facility] 06/01/2017, 9:41 AM

## 2017-06-02 LAB — PSA: Prostate Specific Ag, Serum: 0.2 ng/mL (ref 0.0–4.0)

## 2017-06-02 LAB — TESTOSTERONE

## 2017-06-11 ENCOUNTER — Ambulatory Visit (INDEPENDENT_AMBULATORY_CARE_PROVIDER_SITE_OTHER): Payer: Medicare Other | Admitting: Urology

## 2017-06-11 DIAGNOSIS — C61 Malignant neoplasm of prostate: Secondary | ICD-10-CM

## 2017-06-11 DIAGNOSIS — R9721 Rising PSA following treatment for malignant neoplasm of prostate: Secondary | ICD-10-CM

## 2017-07-16 ENCOUNTER — Other Ambulatory Visit: Payer: Self-pay | Admitting: Family Medicine

## 2017-07-23 ENCOUNTER — Other Ambulatory Visit: Payer: Self-pay | Admitting: Family Medicine

## 2017-07-23 MED ORDER — ALBUTEROL SULFATE HFA 108 (90 BASE) MCG/ACT IN AERS: INHALATION_SPRAY | RESPIRATORY_TRACT | 0 refills | Status: DC

## 2017-07-23 MED ORDER — ALBUTEROL SULFATE HFA 108 (90 BASE) MCG/ACT IN AERS: INHALATION_SPRAY | RESPIRATORY_TRACT | 1 refills | Status: DC

## 2017-07-23 NOTE — Telephone Encounter (Signed)
Sent [facility] to [facility] and some refills to [facility].    Laroy Apple, MD [facility] 07/23/2017, 11:08 AM

## 2017-07-23 NOTE — Telephone Encounter (Signed)
Wife aware

## 2017-08-13 ENCOUNTER — Other Ambulatory Visit: Payer: Self-pay | Admitting: Family Medicine

## 2017-09-03 ENCOUNTER — Encounter: Payer: Self-pay | Admitting: Family Medicine

## 2017-09-03 ENCOUNTER — Ambulatory Visit (INDEPENDENT_AMBULATORY_CARE_PROVIDER_SITE_OTHER): Payer: Medicare Other | Admitting: Family Medicine

## 2017-09-03 VITALS — BP 119/82 | HR 67 | Temp 97.1°F | Ht 71.0 in | Wt 166.0 lb

## 2017-09-03 DIAGNOSIS — R509 Fever, unspecified: Secondary | ICD-10-CM | POA: Diagnosis not present

## 2017-09-03 DIAGNOSIS — J40 Bronchitis, not specified as acute or chronic: Secondary | ICD-10-CM

## 2017-09-03 DIAGNOSIS — J329 Chronic sinusitis, unspecified: Secondary | ICD-10-CM | POA: Diagnosis not present

## 2017-09-03 LAB — VERITOR FLU A/B WAIVED
Influenza A: NEGATIVE
Influenza B: NEGATIVE

## 2017-09-03 LAB — URINALYSIS, COMPLETE
BILIRUBIN UA: NEGATIVE
GLUCOSE, UA: NEGATIVE
Ketones, UA: NEGATIVE
Leukocytes, UA: NEGATIVE
NITRITE UA: NEGATIVE
PH UA: 5 (ref 5.0–7.5)
RBC UA: NEGATIVE
Specific Gravity, UA: 1.02 (ref 1.005–1.030)
UUROB: 0.2 mg/dL (ref 0.2–1.0)

## 2017-09-03 LAB — MICROSCOPIC EXAMINATION
Bacteria, UA: NONE SEEN
Epithelial Cells (non renal): NONE SEEN /hpf (ref 0–10)
RBC MICROSCOPIC, UA: NONE SEEN /HPF (ref 0–?)
Renal Epithel, UA: NONE SEEN /hpf
WBC UA: NONE SEEN /HPF (ref 0–?)

## 2017-09-03 MED ORDER — LEVOFLOXACIN 250 MG PO TABS: 250.0000 mg | ORAL_TABLET | Freq: Every day | ORAL | 0 refills | Status: DC

## 2017-09-03 NOTE — Progress Notes (Signed)
Chief Complaint  Patient presents with  . Fever    pt here today c/o cough and fever of 102 yesterday    HPI  Patient presents today for Patient presents with upper respiratory congestion. Rhinorrhea that is frequently purulent. There is moderate sore throat. Patient reports coughing frequently as well. Thick sputum noted. There is 102 degree  Fever yesterday without chills or sweats. The patient denies being short of breath. Onset was 2 days ago. Gradually worsening. Tried OTCs without improvement.  PMH: Smoking status noted ROS: Per HPI  Objective: BP 119/82   Pulse 67   Temp (!) 97.1 F (36.2 C) (Oral)   Ht 5\' 11"  (1.803 m)   Wt 166 lb (75.3 kg)   BMI 23.15 kg/m  Gen: NAD, alert, cooperative with exam HEENT: NCAT, Nasal passages swollen, red TMS RED CV: RRR, good S1/S2, no murmur Resp: Bronchitis changes with scattered wheezes, non-labored Ext: No edema, warm Neuro: Alert and oriented, No gross deficits  Assessment and plan:  1. Fever, unspecified fever cause   2. Sinobronchitis     Meds ordered this encounter  Medications  . levofloxacin (LEVAQUIN) 250 MG tablet    Sig: Take 1 tablet (250 mg total) by mouth daily.    Dispense:  7 tablet    Refill:  0    Orders Placed This Encounter  Procedures  . Urine Culture  . Veritor Flu A/B Waived    Order Specific Question:   Source    Answer:   fever  . Urinalysis, Complete    Follow up as needed.  Claretta Fraise, MD

## 2017-09-04 LAB — URINE CULTURE: Organism ID, Bacteria: NO GROWTH

## 2017-10-04 ENCOUNTER — Ambulatory Visit (INDEPENDENT_AMBULATORY_CARE_PROVIDER_SITE_OTHER): Payer: Medicare Other | Admitting: Family Medicine

## 2017-10-04 ENCOUNTER — Encounter: Payer: Self-pay | Admitting: Family Medicine

## 2017-10-04 VITALS — BP 157/77 | HR 57 | Temp 96.5°F | Ht 71.0 in | Wt 167.4 lb

## 2017-10-04 DIAGNOSIS — R519 Headache, unspecified: Secondary | ICD-10-CM

## 2017-10-04 DIAGNOSIS — R51 Headache: Secondary | ICD-10-CM | POA: Diagnosis not present

## 2017-10-04 DIAGNOSIS — R413 Other amnesia: Secondary | ICD-10-CM

## 2017-10-04 NOTE — Progress Notes (Signed)
   HPI  Patient presents today here with headaches and memory loss.  Patient explains that he has had headaches off and on for maybe a year and a half, they have been more noticeable for about 6 weeks. He has had 2-3 headaches a week, they last several hours and are in varying places. Headaches are described as squeezing type headaches that last hours. He does not have photophobia phonophobia, or worsening with smells. No Upset stomach were associated.  The headaches originate from varying areas including right temporal area, occipital head area and mid parietal area.   Memory loss, vague insidious onset of symptoms over the last year or so. No head injuries. MRI from the [place] was normal last spring.  Spring 2018.  PMH: Smoking status noted ROS: Per HPI  Objective: BP (!) 157/77   Pulse (!) 57   Temp (!) 96.5 F (35.8 C) (Oral)   Ht 5\' 11"  (1.803 m)   Wt 167 lb 6.4 oz (75.9 kg)   BMI 23.35 kg/m  Gen: NAD, alert, cooperative with exam HEENT: NCAT, EOMI, PERRL,  CV: RRR, good S1/S2, no murmur Resp: CTABL, no wheezes, non-labored Ext: No edema, warm Neuro: Alert and oriented, strength 5/5 in bilateral upper and lower extremities, cranial nerves II through XII intact, normal gait  MMSE - Mini Mental State Exam 10/04/2017  Orientation to time 2  Orientation to Place 4  Registration 2  Attention/ Calculation 5  Recall 3  Language- name 2 objects 2  Language- repeat 1  Language- follow 3 step command 3  Language- read & follow direction 1  Write a sentence 1  Copy design 1  Total score 25    Assessment and plan:  #Headache- new problem to me Unclear etiology, given blood thinners and recent think it is most prudent to go ahead and rule out bleed. CT head If no improvement would recommend neurology evaluation  #Memory loss - new problem to me MMSE with mild impairment Pt is moderately symptomatic CT head   Laroy Apple, MD [facility] 10/04/2017, 1:19 PM

## 2017-10-15 ENCOUNTER — Other Ambulatory Visit: Payer: Self-pay | Admitting: Family Medicine

## 2017-10-15 ENCOUNTER — Ambulatory Visit (HOSPITAL_COMMUNITY)
Admission: RE | Admit: 2017-10-15 | Discharge: 2017-10-15 | Disposition: A | Discharge status: Home / Self Care | Payer: Medicare Other | Source: Ambulatory Visit | Attending: Family Medicine | Admitting: Family Medicine

## 2017-10-15 DIAGNOSIS — R51 Headache: Secondary | ICD-10-CM | POA: Diagnosis not present

## 2017-10-15 DIAGNOSIS — G319 Degenerative disease of nervous system, unspecified: Secondary | ICD-10-CM | POA: Diagnosis not present

## 2017-10-15 DIAGNOSIS — I6782 Cerebral ischemia: Secondary | ICD-10-CM | POA: Diagnosis not present

## 2017-10-15 DIAGNOSIS — R413 Other amnesia: Secondary | ICD-10-CM | POA: Diagnosis not present

## 2017-11-29 ENCOUNTER — Ambulatory Visit (INDEPENDENT_AMBULATORY_CARE_PROVIDER_SITE_OTHER): Payer: Medicare Other | Admitting: Family Medicine

## 2017-11-29 ENCOUNTER — Encounter: Payer: Self-pay | Admitting: Family Medicine

## 2017-11-29 VITALS — BP 173/91 | HR 62 | Temp 97.2°F | Ht 71.0 in | Wt 168.6 lb

## 2017-11-29 DIAGNOSIS — I480 Paroxysmal atrial fibrillation: Secondary | ICD-10-CM

## 2017-11-29 DIAGNOSIS — I10 Essential (primary) hypertension: Secondary | ICD-10-CM

## 2017-11-29 DIAGNOSIS — C61 Malignant neoplasm of prostate: Secondary | ICD-10-CM

## 2017-11-29 MED ORDER — LISINOPRIL 10 MG PO TABS: 10.0000 mg | ORAL_TABLET | Freq: Every day | ORAL | 3 refills | Status: DC

## 2017-11-29 NOTE — Patient Instructions (Addendum)
Great to see you!  Come back in 1 month to follow up for HTN

## 2017-11-29 NOTE — Progress Notes (Signed)
   HPI  Patient presents today here to follow up for chronic medical problems  HTN Previously on Lisinopril and stopped for low BP Now elelvated at home, 150s.  No HA pr CP  A fib No bleeding, taking xareltoi   Had back pain with playing golf last week, improved, has played again.   Needs labs for prostate cancer Treated by Dr. Jeffie Pollock, on lupron.   PMH: Smoking status noted ROS: Per HPI  Objective: BP (!) 173/91   Pulse 62   Temp (!) 97.2 F (36.2 C) (Oral)   Ht '5\' 11"'$  (1.803 m)   Wt 168 lb 9.6 oz (76.5 kg)   BMI 23.51 kg/m  Gen: NAD, alert, cooperative with exam HEENT: NCAT CV: irreg irreg, regular rate Resp: CTABL, no wheezes, non-labored Ext: No edema, warm Neuro: Alert and oriented, No gross deficits  Assessment and plan:  # HTN Continue toprol, start 10 mg lisinopril 3-4 week follow up  # A fib Rate controlled Xarelto renally dosed CBC  # prostate cancer On lupron See's Dr. Jeffie Pollock, Labs per his order sheet, following up Friday.     Orders Placed This Encounter  Procedures  . Testosterone  . PSA  . CMP14+EGFR  . CBC with Differential/Platelet  . Lipid panel  . TSH    Meds ordered this encounter  Medications  . lisinopril (PRINIVIL,ZESTRIL) 10 MG tablet    Sig: Take 1 tablet (10 mg total) by mouth daily.    Dispense:  90 tablet    Refill:  [place], MD [place] 11/29/2017, 8:59 AM

## 2017-11-30 LAB — CMP14+EGFR
A/G RATIO: 1.7 (ref 1.2–2.2)
ALK PHOS: 95 IU/L (ref 39–117)
ALT: 13 IU/L (ref 0–44)
AST: 22 IU/L (ref 0–40)
Albumin: 4.3 g/dL (ref 3.5–4.7)
BUN/Creatinine Ratio: 10 (ref 10–24)
BUN: 16 mg/dL (ref 8–27)
Bilirubin Total: 0.3 mg/dL (ref 0.0–1.2)
CALCIUM: 9.2 mg/dL (ref 8.6–10.2)
CO2: 27 mmol/L (ref 20–29)
CREATININE: 1.6 mg/dL — AB (ref 0.76–1.27)
Chloride: 101 mmol/L (ref 96–106)
GFR calc Af Amer: 45 mL/min/{1.73_m2} — ABNORMAL LOW (ref >59)
GFR, EST NON AFRICAN AMERICAN: 39 mL/min/{1.73_m2} — AB (ref >59)
Globulin, Total: 2.5 g/dL (ref 1.5–4.5)
Glucose: 90 mg/dL (ref 65–99)
POTASSIUM: 5 mmol/L (ref 3.5–5.2)
Sodium: 142 mmol/L (ref 134–144)
Total Protein: 6.8 g/dL (ref 6.0–8.5)

## 2017-11-30 LAB — CBC WITH DIFFERENTIAL/PLATELET
BASOS: 1 %
Basophils Absolute: 0.1 10*3/uL (ref 0.0–0.2)
EOS (ABSOLUTE): 0.2 10*3/uL (ref 0.0–0.4)
Eos: 3 %
Hematocrit: 45.9 % (ref 37.5–51.0)
Hemoglobin: 15.6 g/dL (ref 13.0–17.7)
IMMATURE GRANS (ABS): 0 10*3/uL (ref 0.0–0.1)
IMMATURE GRANULOCYTES: 0 %
LYMPHS: 18 %
Lymphocytes Absolute: 1.2 10*3/uL (ref 0.7–3.1)
MCH: 30.5 pg (ref 26.6–33.0)
MCHC: 34 g/dL (ref 31.5–35.7)
MCV: 90 fL (ref 79–97)
Monocytes Absolute: 0.4 10*3/uL (ref 0.1–0.9)
Monocytes: 6 %
NEUTROS PCT: 72 %
Neutrophils Absolute: 4.9 10*3/uL (ref 1.4–7.0)
PLATELETS: 182 10*3/uL (ref 150–379)
RBC: 5.12 x10E6/uL (ref 4.14–5.80)
RDW: 14.3 % (ref 12.3–15.4)
WBC: 6.8 10*3/uL (ref 3.4–10.8)

## 2017-11-30 LAB — LIPID PANEL
CHOLESTEROL TOTAL: 194 mg/dL (ref 100–199)
Chol/HDL Ratio: 3.3 ratio (ref 0.0–5.0)
HDL: 58 mg/dL (ref >39)
LDL Calculated: 99 mg/dL (ref 0–99)
TRIGLYCERIDES: 187 mg/dL — AB (ref 0–149)
VLDL CHOLESTEROL CAL: 37 mg/dL (ref 5–40)

## 2017-11-30 LAB — TESTOSTERONE: TESTOSTERONE: 6 ng/dL — AB (ref 264–916)

## 2017-11-30 LAB — TSH: TSH: 4.54 u[IU]/mL — AB (ref 0.450–4.500)

## 2017-11-30 LAB — PSA: PROSTATE SPECIFIC AG, SERUM: 0.2 ng/mL (ref 0.0–4.0)

## 2017-12-03 ENCOUNTER — Ambulatory Visit (INDEPENDENT_AMBULATORY_CARE_PROVIDER_SITE_OTHER): Payer: Medicare Other | Admitting: Urology

## 2017-12-03 DIAGNOSIS — R9721 Rising PSA following treatment for malignant neoplasm of prostate: Secondary | ICD-10-CM

## 2017-12-03 DIAGNOSIS — C61 Malignant neoplasm of prostate: Secondary | ICD-10-CM

## 2017-12-03 DIAGNOSIS — Z79899 Other long term (current) drug therapy: Secondary | ICD-10-CM | POA: Diagnosis not present

## 2017-12-15 ENCOUNTER — Other Ambulatory Visit: Payer: Self-pay | Admitting: Urology

## 2017-12-15 DIAGNOSIS — Z79899 Other long term (current) drug therapy: Secondary | ICD-10-CM

## 2017-12-20 ENCOUNTER — Ambulatory Visit (HOSPITAL_COMMUNITY)
Admission: RE | Admit: 2017-12-20 | Discharge: 2017-12-20 | Disposition: A | Discharge status: Home / Self Care | Payer: Medicare Other | Source: Ambulatory Visit | Attending: Urology | Admitting: Urology

## 2017-12-20 DIAGNOSIS — Z79818 Long term (current) use of other agents affecting estrogen receptors and estrogen levels: Secondary | ICD-10-CM | POA: Insufficient documentation

## 2017-12-20 DIAGNOSIS — Z13820 Encounter for screening for osteoporosis: Secondary | ICD-10-CM | POA: Diagnosis not present

## 2017-12-20 DIAGNOSIS — C61 Malignant neoplasm of prostate: Secondary | ICD-10-CM | POA: Diagnosis not present

## 2017-12-20 DIAGNOSIS — Z79899 Other long term (current) drug therapy: Secondary | ICD-10-CM

## 2017-12-28 ENCOUNTER — Other Ambulatory Visit: Payer: Self-pay | Admitting: Family Medicine

## 2018-01-10 ENCOUNTER — Ambulatory Visit (INDEPENDENT_AMBULATORY_CARE_PROVIDER_SITE_OTHER): Payer: Medicare Other | Admitting: Family Medicine

## 2018-01-10 ENCOUNTER — Encounter: Payer: Self-pay | Admitting: Family Medicine

## 2018-01-10 VITALS — BP 140/74 | HR 53 | Temp 96.5°F | Ht 71.0 in | Wt 168.0 lb

## 2018-01-10 DIAGNOSIS — I10 Essential (primary) hypertension: Secondary | ICD-10-CM

## 2018-01-10 LAB — BMP8+EGFR
BUN / CREAT RATIO: 15 (ref 10–24)
BUN: 24 mg/dL (ref 8–27)
CO2: 26 mmol/L (ref 20–29)
CREATININE: 1.62 mg/dL — AB (ref 0.76–1.27)
Calcium: 9.4 mg/dL (ref 8.6–10.2)
Chloride: 103 mmol/L (ref 96–106)
GFR calc non Af Amer: 38 mL/min/{1.73_m2} — ABNORMAL LOW (ref >59)
GFR, EST AFRICAN AMERICAN: 44 mL/min/{1.73_m2} — AB (ref >59)
GLUCOSE: 94 mg/dL (ref 65–99)
Potassium: 5 mmol/L (ref 3.5–5.2)
SODIUM: 142 mmol/L (ref 134–144)

## 2018-01-10 NOTE — Progress Notes (Signed)
   HPI  Patient presents today here for follow-up hypertension.  Patient was seen last month with blood pressure 167/80, he was started back on lisinopril 10 mg.  He is tolerating it well and denies any side effects.  Patient's not really checking his blood pressure at home any longer. He does have a history of low blood pressure.  He plays golf 4-6 times a week.  PMH: Smoking status noted ROS: Per HPI  Objective: BP 140/74   Pulse (!) 53   Temp (!) 96.5 F (35.8 C) (Oral)   Ht '5\' 11"'$  (1.803 m)   Wt 168 lb (76.2 kg)   BMI 23.43 kg/m  Gen: NAD, alert, cooperative with exam HEENT: NCAT CV: RRR, good S1/S2, no murmur Resp: CTABL, no wheezes, non-labored Ext: No edema, warm Neuro: Alert and oriented, No gross deficits Skin: BL arms with 3-5 bruises ranging 1 cm to 4 cm in diameter.   Assessment and plan:  #Hypertension Well-controlled on lisinopril 10 mg plus metoprolol, no changes   #solar lentigo Discussed age is the primary factor in solar lentigo, also Xarelto is likely leading to easy bruisability.    Orders Placed This Encounter  Procedures  . [place], MD [facility] 01/10/2018, 9:38 AM

## 2018-01-22 ENCOUNTER — Other Ambulatory Visit: Payer: Self-pay | Admitting: Family Medicine

## 2018-01-26 ENCOUNTER — Other Ambulatory Visit: Payer: Self-pay | Admitting: Family Medicine

## 2018-01-27 ENCOUNTER — Other Ambulatory Visit: Payer: Self-pay | Admitting: Family Medicine

## 2018-01-29 DIAGNOSIS — S80861A Insect bite (nonvenomous), right lower leg, initial encounter: Secondary | ICD-10-CM | POA: Diagnosis not present

## 2018-01-29 DIAGNOSIS — W57XXXA Bitten or stung by nonvenomous insect and other nonvenomous arthropods, initial encounter: Secondary | ICD-10-CM | POA: Diagnosis not present

## 2018-03-22 ENCOUNTER — Encounter: Payer: Self-pay | Admitting: Family Medicine

## 2018-03-22 ENCOUNTER — Ambulatory Visit (INDEPENDENT_AMBULATORY_CARE_PROVIDER_SITE_OTHER): Payer: Medicare Other | Admitting: Family Medicine

## 2018-03-22 VITALS — BP 153/84 | HR 61 | Temp 97.0°F | Ht 71.0 in | Wt 164.0 lb

## 2018-03-22 DIAGNOSIS — H8112 Benign paroxysmal vertigo, left ear: Secondary | ICD-10-CM

## 2018-03-22 NOTE — Progress Notes (Signed)
   HPI  Patient presents today here with dizziness.  Patient states that is been going on for 1 to 2 months. Its most consistent when he turns to his left side and lies down on his pillow.  He describes world spinning as a "tornado in the room" that lasts about 3 seconds and then resolves.  Patient has also had some inconsistent symptoms with standing up and looking up.  He has a history of TIA x2. No new weakness, numbness, tingling. He is also had hearing aids for about 1 to 2 months.  He sees audiology at the [place]  PMH: Smoking status noted ROS: Per HPI  Objective: BP (!) 153/84   Pulse 61   Temp (!) 97 F (36.1 C) (Oral)   Ht 5\' 11"  (1.803 m)   Wt 164 lb (74.4 kg)   BMI 22.87 kg/m  Gen: NAD, alert, cooperative with exam HEENT: NCAT, hearing aids present bilaterally CV: RRR, good S1/S2, no murmur Resp: CTABL, no wheezes, non-labored Neuro: Alert and oriented, strength 5/5 and sensation intact in bilateral lower extremities and upper extremities. EOMI, PERRLA  Assessment and plan:  #BPPV Discussed Epley's maneuvers, recommended discussing with ENT and audiology at the [facility] if he has persistent symptoms. Discussed meclizine, will not prescribe at this time due to inconsistent improvement and some increased risk of falls    Laroy Apple, MD [facility] 03/22/2018, 9:35 AM

## 2018-03-22 NOTE — Patient Instructions (Signed)
Great to see you!  Discuss this at the [facility]   Vertigo Vertigo means that you feel like you are moving when you are not. Vertigo can also make you feel like things around you are moving when they are not. This feeling can come and go at any time. Vertigo often goes away on its own. Follow these instructions at home:  Avoid making fast movements.  Avoid driving.  Avoid using heavy machinery.  Avoid doing any task or activity that might cause danger to you or other people if you would have a vertigo attack while you are doing it.  Sit down right away if you feel dizzy or have trouble with your balance.  Take over-the-counter and prescription medicines only as told by your doctor.  Follow instructions from your doctor about which positions or movements you should avoid.  Drink enough fluid to keep your pee (urine) clear or pale yellow.  Keep all follow-up visits as told by your doctor. This is important. Contact a doctor if:  Medicine does not help your vertigo.  You have a fever.  Your problems get worse or you have new symptoms.  Your family or friends see changes in your behavior.  You feel sick to your stomach (nauseous) or you throw up (vomit).  You have a "pins and needles" feeling or you are numb in part of your body. Get help right away if:  You have trouble moving or talking.  You are always dizzy.  You pass out (faint).  You get very bad headaches.  You feel weak or have trouble using your hands, arms, or legs.  You have changes in your hearing.  You have changes in your seeing (vision).  You get a stiff neck.  Bright light starts to bother you. This information is not intended to replace advice given to you by your health care provider. Make sure you discuss any questions you have with your health care provider. Document Released: 06/09/2008 Document Revised: 02/06/2016 Document Reviewed: 12/24/2014 Elsevier Interactive Patient Education  [employer].

## 2018-04-05 ENCOUNTER — Other Ambulatory Visit: Payer: Self-pay | Admitting: Cardiology

## 2018-04-06 ENCOUNTER — Telehealth: Payer: Self-pay | Admitting: Family Medicine

## 2018-04-06 NOTE — Telephone Encounter (Signed)
[place], MD [facility] 04/06/2018, 4:55 PM

## 2018-04-06 NOTE — Telephone Encounter (Signed)
FYI for provider. No response necessary.

## 2018-04-13 ENCOUNTER — Encounter: Payer: Self-pay | Admitting: Family Medicine

## 2018-04-18 ENCOUNTER — Other Ambulatory Visit: Payer: Self-pay | Admitting: Cardiology

## 2018-04-18 ENCOUNTER — Other Ambulatory Visit: Payer: Self-pay | Admitting: Family Medicine

## 2018-04-18 MED ORDER — METOPROLOL TARTRATE 25 MG PO TABS: 12.5000 mg | ORAL_TABLET | Freq: Two times a day (BID) | ORAL | 3 refills | Status: DC

## 2018-04-18 NOTE — Telephone Encounter (Signed)
Spoke with wife [name]) - refill sent to [facility] as requested.  Follow up OV scheduled for 06/15/2018 with Dr. Domenic Polite in [place].

## 2018-04-18 NOTE — Telephone Encounter (Signed)
° °  1. Which medications need to be refilled? (please list name of each medication and dose if known)  metoprolol tartrate (LOPRESSOR) 25 MG    2. Which pharmacy/location (including street and city if local pharmacy) is medication to be sent to?    [facility]  [address] [place]  3. Do they need a 30 day or 90 day supply?

## 2018-05-04 ENCOUNTER — Encounter: Payer: Self-pay | Admitting: Family Medicine

## 2018-05-04 ENCOUNTER — Ambulatory Visit (INDEPENDENT_AMBULATORY_CARE_PROVIDER_SITE_OTHER): Payer: Medicare Other | Admitting: Family Medicine

## 2018-05-04 VITALS — BP 100/61 | HR 64 | Temp 97.2°F | Ht 71.0 in | Wt 159.0 lb

## 2018-05-04 DIAGNOSIS — J329 Chronic sinusitis, unspecified: Secondary | ICD-10-CM | POA: Diagnosis not present

## 2018-05-04 DIAGNOSIS — J40 Bronchitis, not specified as acute or chronic: Secondary | ICD-10-CM

## 2018-05-04 MED ORDER — CEFUROXIME AXETIL 500 MG PO TABS: 500.0000 mg | ORAL_TABLET | Freq: Two times a day (BID) | ORAL | 0 refills | Status: AC

## 2018-05-04 MED ORDER — PSEUDOEPHEDRINE-GUAIFENESIN ER 60-600 MG PO TB12: 1.0000 | ORAL_TABLET | Freq: Two times a day (BID) | ORAL | 0 refills | Status: AC

## 2018-05-04 NOTE — Addendum Note (Signed)
Addended by: Claretta Fraise on: 05/04/2018 09:46 AM   Modules accepted: Orders

## 2018-05-04 NOTE — Progress Notes (Signed)
Chief Complaint  Patient presents with  . Cough    pt here today c/o cough and congestion    HPI  Patient presents today for Patient presents with upper respiratory congestion. Copious drainage. There is moderate sore throat. Patient reports coughing frequently as well.  Goopy, thick copious amounts of sputum noted. There is no fever, chills or sweats. The patient denies being short of breath. Onset was4 days ago. Gradually worsening. Tried OTCs without improvement.  PMH: Smoking status noted ROS: Per HPI  Objective: BP 100/61   Pulse 64   Temp (!) 97.2 F (36.2 C) (Oral)   Ht 5\' 11"  (1.803 m)   Wt 159 lb (72.1 kg)   BMI 22.18 kg/m  Gen: NAD, alert, cooperative with exam HEENT: NCAT, Nasal passages swollen, red TMS RED CV: RRR, good S1/S2, no murmur Resp: Bronchitis changes with scattered wheezes, non-labored Ext: No edema, warm Neuro: Alert and oriented, No gross deficits  Assessment and plan:  1. Sinobronchitis     No orders of the defined types were placed in this encounter.   No orders of the defined types were placed in this encounter.   Follow up as needed.  Claretta Fraise, MD

## 2018-05-13 ENCOUNTER — Ambulatory Visit: Payer: Medicare Other | Admitting: Family Medicine

## 2018-05-17 ENCOUNTER — Other Ambulatory Visit: Payer: Self-pay | Admitting: Family Medicine

## 2018-05-23 ENCOUNTER — Ambulatory Visit (INDEPENDENT_AMBULATORY_CARE_PROVIDER_SITE_OTHER): Payer: Medicare Other | Admitting: Family Medicine

## 2018-05-23 ENCOUNTER — Encounter: Payer: Self-pay | Admitting: Family Medicine

## 2018-05-23 VITALS — BP 106/69 | HR 53 | Temp 96.9°F | Ht 71.0 in | Wt 159.0 lb

## 2018-05-23 DIAGNOSIS — I10 Essential (primary) hypertension: Secondary | ICD-10-CM | POA: Diagnosis not present

## 2018-05-23 DIAGNOSIS — N183 Chronic kidney disease, stage 3 unspecified: Secondary | ICD-10-CM

## 2018-05-23 DIAGNOSIS — C61 Malignant neoplasm of prostate: Secondary | ICD-10-CM | POA: Diagnosis not present

## 2018-05-23 DIAGNOSIS — I480 Paroxysmal atrial fibrillation: Secondary | ICD-10-CM

## 2018-05-23 NOTE — Progress Notes (Signed)
Subjective: CC: HTN, a-fib, prostate cancer PCP: Timmothy Euler, MD NLG:[id number] Erik Mullins is a 84 y.o. male presenting to clinic today for:  1. Hypertension Patient reports that he is Compliant with Lisinopril 10mg  and Metoprolol 12.5mg , Side effects: none.  Dizziness resolved after Epley maneuver at home.  Denies visual changes, nausea, vomiting, chest pain, LE swelling, abdominal pain or shortness of breath.  He notes that he had quite a bit of coughing with a recent bronchitis but since has been doing well.  2. Atrial fibrillation Reports compliance with Xarelto, Metoprolol 12.5mg  BID.  He denies any hematochezia, melena, excessive bleeding or bruising.  He gets his Xarelto from the [place].  3. Prostate cancer Patient is followed by Dr. Jeffie Pollock with [facility] in [place] for prostate cancer.  He is currently on Lupron injections.  He sees urology every 6 months and has an upcoming appointment next week.  He would like to get his testosterone and PSA checked today.  Additionally, he also brings in labs from recent blood work at the [place].   ROS: Per HPI  Allergies  Allergen Reactions  . Morphine Other (See Comments)    hallucinate   Past Medical History:  Diagnosis Date  . AAA (abdominal aortic aneurysm) ([place])    Remote ~1994   . Anemia   . Arthritis   . Atrial fibrillation [facility])    Diagnosed December 2014  . 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 Cataract    Bilateral  . Cirrhosis ([place])   . CKD (chronic kidney disease) stage 3, GFR 30-59 ml/min (HCC)   . Essential hypertension, benign   . Gout   . History of GI bleed   . History of gout   . History of hepatitis   . History of stroke   . Hyperlipidemia   . PAD (peripheral artery disease) (HCC)    Stenting in lower extremities (no records)  . Prostate cancer ([place])    XRT; in remission  . Type 2 diabetes mellitus (HCC)     Current Outpatient Medications:  .  albuterol (PROVENTIL HFA;VENTOLIN  HFA) 108 (90 Base) MCG/ACT inhaler, INHALE ONE TO TWO PUFFS BY MOUTH EVERY 6 HOURS AS NEEDED FOR WHEEZING FOR SHORTNESS OF BREATH, Disp: 18 Inhaler, Rfl: 2 .  allopurinol (ZYLOPRIM) 100 MG tablet, TAKE 1 TABLET BY MOUTH EVERY DAY, Disp: 30 tablet, Rfl: 2 .  Calcium Carbonate-Vitamin D (CALTRATE 600+D) 600-400 MG-UNIT per tablet, Take 1 tablet by mouth daily.  , Disp: , Rfl:  .  lisinopril (PRINIVIL,ZESTRIL) 10 MG tablet, Take 1 tablet (10 mg total) by mouth daily., Disp: 90 tablet, Rfl: 3 .  metoprolol tartrate (LOPRESSOR) 25 MG tablet, Take 0.5 tablets (12.5 mg total) by mouth 2 (two) times daily., Disp: 90 tablet, Rfl: 3 .  Multiple Vitamin (MULTIVITAMIN) tablet, Take 1 tablet by mouth daily., Disp: , Rfl:  .  XARELTO 15 MG TABS tablet, TAKE 1 TABLET (15 MG TOTAL) BY MOUTH DAILY WITH SUPPER., Disp: 90 tablet, Rfl: 0 Social History   Socioeconomic History  . Marital status: Married    Spouse name: Not on file  . Number of children: Not on file  . Years of education: Not on file  . Highest education level: Not on file  Occupational History  . Not on file  Social Needs  . Financial resource strain: Not on file  . Food insecurity:    Worry: Not on file    Inability: Not  on file  . Transportation needs:    Medical: Not on file    Non-medical: Not on file  Tobacco Use  . Smoking status: Former Smoker    Packs/day: 0.50    Years: 70.00    Pack years: 35.00    Types: Cigarettes    Start date: 09/11/1942    Last attempt to quit: 08/21/2013    Years since quitting: 4.7  . Smokeless tobacco: Former Systems developer    Types: Snuff, Chew  Substance and Sexual Activity  . Alcohol use: Yes    Alcohol/week: 0.0 standard drinks    Comment: 08/22/2013 "drink ~ 1 beer and wine/yr"  . Drug use: No  . Sexual activity: Never  Lifestyle  . Physical activity:    Days per week: Not on file    Minutes per session: Not on file  . Stress: Not on file  Relationships  . Social connections:    Talks on phone:  Not on file    Gets together: Not on file    Attends religious service: Not on file    Active member of club or organization: Not on file    Attends meetings of clubs or organizations: Not on file    Relationship status: Not on file  . Intimate partner violence:    Fear of current or ex partner: Not on file    Emotionally abused: Not on file    Physically abused: Not on file    Forced sexual activity: Not on file  Other Topics Concern  . Not on file  Social History Narrative   From [place]. Relocated to this area from [facility].   Family History  Problem Relation Age of Onset  . Cancer Mother        uterine deceased age 23  . Hypertension Sister   . Arrhythmia Other        Atrial fibrillation  . Hypertension Maternal Grandmother     Objective: Office vital signs reviewed. BP 106/69   Pulse (!) 53   Temp (!) 96.9 F (36.1 C) (Oral)   Ht 5\' 11"  (1.803 m)   Wt 159 lb (72.1 kg)   BMI 22.18 kg/m   Physical Examination:  General: Awake, alert, well nourished, No acute distress HEENT: Normal, MMM Cardio: bradycardic, seemingly regular. S1S2 heard, no murmurs appreciated Pulm: clear to auscultation bilaterally, no wheezes, rhonchi or rales; normal work of breathing on room air Extremities: warm, well perfused, No edema, cyanosis or clubbing; +2 pulses bilaterally MSK: slow gait and normal station Skin: dry; intact; no rashes.        Assessment/ Plan: 84 y.o. male   1. Essential hypertension, benign Blood pressure controlled.  No changes made.  He has refills through March.  2. Paroxysmal atrial fibrillation (HCC) Rate controlled.  Compliant with Xarelto.  Does not need refills.  3. CKD (chronic kidney disease) stage 3, GFR 30-59 ml/min (HCC) I reviewed his recent labs from the [place].  Scanned into the note today as well as into the EMR.  CKD3b based on most recent GFR 80mL/min.  Cr 1.85.  This is a change from check in April.  We will repeat BMP today as well  as a CBC since he was noted to have a slightly elevated hemoglobin.  I do question dehydration/hemoconcentration given rise in both of these levels.  Will contact patient with results on Wednesday.  For now, continue lisinopril and Xarelto at renal dose. - Basic Metabolic Panel - CBC with Differential  4. Prostate cancer ([place]) We will check PSA and testosterone per their request.  They have follow-up with Dr. Jeffie Pollock. - PSA - Testosterone   Orders Placed This Encounter  Procedures  . PSA  . Testosterone  . Basic Metabolic Panel  . CBC with Differential   No orders of the defined types were placed in this encounter.    Janora Norlander, DO [place] [phone]

## 2018-05-23 NOTE — Patient Instructions (Signed)
You had labs performed today.  You will be contacted with the results of the labs once they are available, usually in the next 3 business days for routine lab work.   

## 2018-05-24 LAB — CBC WITH DIFFERENTIAL/PLATELET
BASOS ABS: 0.1 10*3/uL (ref 0.0–0.2)
Basos: 1 %
EOS (ABSOLUTE): 0.4 10*3/uL (ref 0.0–0.4)
Eos: 3 %
HEMOGLOBIN: 14.7 g/dL (ref 13.0–17.7)
Hematocrit: 43.7 % (ref 37.5–51.0)
IMMATURE GRANS (ABS): 0 10*3/uL (ref 0.0–0.1)
Immature Granulocytes: 0 %
LYMPHS: 15 %
Lymphocytes Absolute: 1.6 10*3/uL (ref 0.7–3.1)
MCH: 29.3 pg (ref 26.6–33.0)
MCHC: 33.6 g/dL (ref 31.5–35.7)
MCV: 87 fL (ref 79–97)
MONOCYTES: 5 %
Monocytes Absolute: 0.6 10*3/uL (ref 0.1–0.9)
Neutrophils Absolute: 8.3 10*3/uL — ABNORMAL HIGH (ref 1.4–7.0)
Neutrophils: 76 %
PLATELETS: 207 10*3/uL (ref 150–450)
RBC: 5.02 x10E6/uL (ref 4.14–5.80)
RDW: 14 % (ref 12.3–15.4)
WBC: 11 10*3/uL — AB (ref 3.4–10.8)

## 2018-05-24 LAB — BASIC METABOLIC PANEL
BUN/Creatinine Ratio: 21 (ref 10–24)
BUN: 36 mg/dL — ABNORMAL HIGH (ref 8–27)
CHLORIDE: 104 mmol/L (ref 96–106)
CO2: 23 mmol/L (ref 20–29)
Calcium: 9.3 mg/dL (ref 8.6–10.2)
Creatinine, Ser: 1.71 mg/dL — ABNORMAL HIGH (ref 0.76–1.27)
GFR calc Af Amer: 42 mL/min/{1.73_m2} — ABNORMAL LOW (ref >59)
GFR calc non Af Amer: 36 mL/min/{1.73_m2} — ABNORMAL LOW (ref >59)
GLUCOSE: 88 mg/dL (ref 65–99)
POTASSIUM: 5.2 mmol/L (ref 3.5–5.2)
Sodium: 141 mmol/L (ref 134–144)

## 2018-05-24 LAB — PSA: Prostate Specific Ag, Serum: 0.3 ng/mL (ref 0.0–4.0)

## 2018-05-24 LAB — TESTOSTERONE: Testosterone: 7 ng/dL — ABNORMAL LOW (ref 264–916)

## 2018-06-03 ENCOUNTER — Ambulatory Visit (INDEPENDENT_AMBULATORY_CARE_PROVIDER_SITE_OTHER): Payer: Medicare Other | Admitting: Urology

## 2018-06-03 DIAGNOSIS — C61 Malignant neoplasm of prostate: Secondary | ICD-10-CM | POA: Diagnosis not present

## 2018-06-03 DIAGNOSIS — R9721 Rising PSA following treatment for malignant neoplasm of prostate: Secondary | ICD-10-CM | POA: Diagnosis not present

## 2018-06-14 NOTE — Progress Notes (Signed)
Cardiology Office Note  Date: 06/15/2018   ID: Erik Mullins, DOB [birth year], MRN [id number]  PCP: Janora Norlander, DO  Primary Cardiologist: Rozann Lesches, MD   Chief Complaint  Patient presents with  . PAF    History of Present Illness: Erik Mullins is an 84 y.o. male last seen in May 2018.  He is here today with his wife for a follow-up visit.  He does not report any significant palpitations since last assessment.  No exertional chest pain.  Still plays golf occasionally.  Reports NYHA class II dyspnea.  No falls or syncope.  I reviewed his recent lab work as outlined below.  I reviewed his medications, cardiac regimen is stable and includes Xarelto, Lopressor, and lisinopril.  I personally reviewed his ECG today which shows sinus rhythm, low voltage in the limb leads.  Past Medical History:  Diagnosis Date  . AAA (abdominal aortic aneurysm) ([place])    Remote ~1994   . Anemia   . Arthritis   . Atrial fibrillation [facility])    Diagnosed December 2014  . 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 Cataract    Bilateral  . Cirrhosis ([place])   . CKD (chronic kidney disease) stage 3, GFR 30-59 ml/min (HCC)   . Essential hypertension, benign   . Gout   . History of GI bleed   . History of gout   . History of hepatitis   . History of stroke   . Hyperlipidemia   . PAD (peripheral artery disease) (HCC)    Stenting in lower extremities (no records)  . Prostate cancer ([place])    XRT; in remission  . Type 2 diabetes mellitus ([place])     Past Surgical History:  Procedure Laterality Date  . ABDOMINAL AORTIC ANEURYSM REPAIR  1980's   in [place]  . APPENDECTOMY    . CAROTID ENDARTERECTOMY Right ~ 1999  . CATARACT EXTRACTION W/ INTRAOCULAR LENS  IMPLANT, BILATERAL Bilateral 2014  . CHOLECYSTECTOMY    . COLON SURGERY    . PARTIAL COLECTOMY     "I was bleeding to death inside; took out 2/3 of my colon" (08/22/2013)    Current Outpatient Medications    Medication Sig Dispense Refill  . albuterol (PROVENTIL HFA;VENTOLIN HFA) 108 (90 Base) MCG/ACT inhaler INHALE ONE TO TWO PUFFS BY MOUTH EVERY 6 HOURS AS NEEDED FOR WHEEZING FOR SHORTNESS OF BREATH 18 Inhaler 2  . allopurinol (ZYLOPRIM) 100 MG tablet TAKE 1 TABLET BY MOUTH EVERY DAY 30 tablet 2  . Calcium Carbonate-Vitamin D (CALTRATE 600+D) 600-400 MG-UNIT per tablet Take 1 tablet by mouth daily.      Marland Kitchen lisinopril (PRINIVIL,ZESTRIL) 10 MG tablet Take 1 tablet (10 mg total) by mouth daily. 90 tablet 3  . metoprolol tartrate (LOPRESSOR) 25 MG tablet Take 0.5 tablets (12.5 mg total) by mouth 2 (two) times daily. 90 tablet 3  . Multiple Vitamin (MULTIVITAMIN) tablet Take 1 tablet by mouth daily.    Alveda Reasons 15 MG TABS tablet TAKE 1 TABLET (15 MG TOTAL) BY MOUTH DAILY WITH SUPPER. 90 tablet 0   No current facility-administered medications for this visit.    Allergies:  Morphine   Social History: The patient  reports that he quit smoking about 4 years ago. His smoking use included cigarettes. He started smoking about 73 years ago. He has a 35.00 pack-year smoking history. He has quit using smokeless tobacco.  His smokeless tobacco use included snuff and  chew. He reports that he drinks alcohol. He reports that he does not use drugs.   ROS:  Please see the history of present illness. Otherwise, complete review of systems is positive for hearing loss, has new hearing aids.  All other systems are reviewed and negative.   Physical Exam: VS:  BP 124/62   Pulse 73   Ht 5\' 11"  (1.803 m)   Wt 162 lb 3.2 oz (73.6 kg)   SpO2 94%   BMI 22.62 kg/m , BMI Body mass index is 22.62 kg/m.  Wt Readings from Last 3 Encounters:  06/15/18 162 lb 3.2 oz (73.6 kg)  05/23/18 159 lb (72.1 kg)  05/04/18 159 lb (72.1 kg)    General: Elderly male, appears comfortable at rest. HEENT: Conjunctiva and lids normal, oropharynx clear. Neck: Supple, no elevated JVP or carotid bruits, no thyromegaly. Lungs: Clear to  auscultation, nonlabored breathing at rest. Cardiac: Regular rate and rhythm, no S3 or significant systolic murmur. Abdomen: Soft, nontender, bowel sounds present. Extremities: No pitting edema, distal pulses 1-+. Skin: Warm and dry. Musculoskeletal: No kyphosis. Neuropsychiatric: Alert and oriented x3, affect grossly appropriate.  ECG: I personally reviewed the tracing from 01/15/2017 which showed normal sinus rhythm.  Recent Labwork: 11/29/2017: ALT 13; AST 22; TSH 4.540 05/23/2018: BUN 36; Creatinine, Ser 1.71; Hemoglobin 14.7; Platelets 207; Potassium 5.2; Sodium 141     Component Value Date/Time   CHOL 194 11/29/2017 0905   CHOL 122 02/07/2013 0903   TRIG 187 (H) 11/29/2017 0905   TRIG 168 (H) 10/24/2013 0909   TRIG 140 02/07/2013 0903   HDL 58 11/29/2017 0905   HDL 44 10/24/2013 0909   HDL 41 02/07/2013 0903   CHOLHDL 3.3 11/29/2017 0905   LDLCALC 99 11/29/2017 0905   LDLCALC 69 10/24/2013 0909   LDLCALC 53 02/07/2013 0903    Other Studies Reviewed Today:  Echocardiogram 08/23/2013: Study Conclusions  Left ventricle: The cavity size was normal. Wall thickness was normal. Systolic function was normal. The estimated ejection fraction was in the range of 60% to 65%.  Assessment and Plan:  1.  Paroxysmal atrial fibrillation.  He is in sinus rhythm today by ECG and reports no increasing palpitations.  Continue stroke prophylaxis with Xarelto and also Lopressor.  2.  CKD stage III, creatinine 1.71.  3.  Essential hypertension, blood pressure is adequately controlled today.  He continues on lisinopril and Lopressor.  4.  Symptomatically stable PAD with previous percutaneous interventions at outside facility.  Current medicines were reviewed with the patient today.   Orders Placed This Encounter  Procedures  . EKG 12-Lead    Disposition: Follow-up in 6 months.  Signed, Satira Sark, MD, [facility] 06/15/2018 3:41 PM    [facility]  [place], [place], [place] [address] Phone: [phone]; Fax: [phone]

## 2018-06-15 ENCOUNTER — Ambulatory Visit (INDEPENDENT_AMBULATORY_CARE_PROVIDER_SITE_OTHER): Payer: Medicare Other | Admitting: Cardiology

## 2018-06-15 ENCOUNTER — Encounter: Payer: Self-pay | Admitting: Cardiology

## 2018-06-15 VITALS — BP 124/62 | HR 73 | Ht 71.0 in | Wt 162.2 lb

## 2018-06-15 DIAGNOSIS — I739 Peripheral vascular disease, unspecified: Secondary | ICD-10-CM | POA: Diagnosis not present

## 2018-06-15 DIAGNOSIS — N183 Chronic kidney disease, stage 3 unspecified: Secondary | ICD-10-CM

## 2018-06-15 DIAGNOSIS — I480 Paroxysmal atrial fibrillation: Secondary | ICD-10-CM

## 2018-06-15 DIAGNOSIS — I10 Essential (primary) hypertension: Secondary | ICD-10-CM

## 2018-06-15 NOTE — Patient Instructions (Addendum)

## 2018-08-19 ENCOUNTER — Other Ambulatory Visit: Payer: Self-pay | Admitting: Pediatrics

## 2018-09-26 DIAGNOSIS — Z23 Encounter for immunization: Secondary | ICD-10-CM | POA: Diagnosis not present

## 2018-11-15 ENCOUNTER — Other Ambulatory Visit: Payer: Self-pay | Admitting: *Deleted

## 2018-11-15 MED ORDER — LISINOPRIL 10 MG PO TABS: 10.0000 mg | ORAL_TABLET | Freq: Every day | ORAL | 0 refills | Status: DC

## 2018-11-19 NOTE — Progress Notes (Signed)
Subjective: CC: HTN, a-fib, prostate cancer PCP: Janora Norlander, DO Erik Mullins is a 85 y.o. male presenting to clinic today for:  1. Hypertension w/ CKD3 Patient reports compliance with Lisinopril 38m and Metoprolol 12.516m No recent gout flares.  Compliant w/ Allopurinol. Needs refills. Side effects: none.  Denies CP, SOB, LE edema, loss of consciousness.  2. Atrial fibrillation Patient reports compliance with Xarelto, Metoprolol 12.74m94mID.  He denies any hematochezia, melena,hematuria.  [facility]. Prostate cancer Patient [facility] [place].  He is requesting Total testosterone and PSA check today.  Again, no hematuria.   ROS: Per HPI  Allergies  Allergen Reactions  . Morphine Other (See Comments)    hallucinate   Past Medical History:  Diagnosis Date  . AAA (abdominal aortic [place]) ([place]  [place] ~1994   . Anemia   . Arthritis   . [facility]  [facility]osed [facility] 2014  . AVNRT (AV nodal re-entry tachycardia) ([place]  Possible  . Cataract    Bilateral  . Cirrhosis ([place] . CKD (chronic kidney disease) stage 3, GFR 30-59 ml/min (HCC)   . Essential hypertension, benign   . Gout   . History of GI bleed   . History of gout   . History of hepatitis   . History of stroke   . Hyperlipidemia   . PAD (peripheral artery disease) (HCC)    Stenting in lower extremities (no records)  . Prostate cancer ([place]  XRT; in remission  . Type 2 diabetes mellitus (HCC)     Current Outpatient Medications:  .  albuterol (PROVENTIL HFA;VENTOLIN HFA) 108 (90 Base) MCG/ACT inhaler, INHALE ONE TO TWO PUFFS BY MOUTH EVERY 6 HOURS AS NEEDED FOR WHEEZING FOR SHORTNESS OF BREATH, Disp: 18 Inhaler, Rfl: 2 .  allopurinol (ZYLOPRIM) 100 MG tablet, TAKE 1 TABLET BY MOUTH EVERY DAY, Disp: 90 tablet, Rfl: 0 .  Calcium Carbonate-Vitamin D (CALTRATE 600+D) 600-400  MG-UNIT per tablet, Take 1 tablet by mouth daily.  , Disp: , Rfl:  .  lisinopril (PRINIVIL,ZESTRIL) 10 MG tablet, Take 1 tablet (10 mg total) by mouth daily., Disp: 90 tablet, Rfl: 0 .  metoprolol tartrate (LOPRESSOR) 25 MG tablet, Take 0.5 tablets (12.5 mg total) by mouth 2 (two) times daily., Disp: 90 tablet, Rfl: 3 .  Multiple Vitamin (MULTIVITAMIN) tablet, Take 1 tablet by mouth daily., Disp: , Rfl:  .  XARELTO 15 MG TABS tablet, TAKE 1 TABLET (15 MG TOTAL) BY MOUTH DAILY WITH SUPPER., Disp: 90 tablet, Rfl: 0 Social History   Socioeconomic History  . Marital status: Married    Spouse name: Not on file  . Number of children: Not on file  . Years of education: Not on file  . Highest education level: Not on file  Occupational History  . Not on file  Social Needs  . Financial resource strain: Not on file  . Food insecurity:    Worry: Not on file    Inability: Not on file  . Transportation needs:    Medical: Not on file    Non-medical: Not on file  Tobacco Use  . Smoking status: Former Smoker    Packs/day: 0.50    Years: 70.00    Pack years: 35.00    Types: Cigarettes    Start date: 09/11/1942    Last attempt to  quit: 08/21/2013    Years since quitting: 5.2  . Smokeless tobacco: Former Systems developer    Types: Snuff, Chew  Substance and Sexual Activity  . Alcohol use: Yes    Alcohol/week: 0.0 standard drinks    Comment: 08/22/2013 "drink ~ 1 beer and wine/yr"  . Drug use: No  . Sexual activity: Never  Lifestyle  . Physical activity:    Days per week: Not on file    Minutes per session: Not on file  . Stress: Not on file  Relationships  . Social connections:    Talks on phone: Not on file    Gets together: Not on file    Attends religious service: Not on file    Active member of club or organization: Not on file    Attends meetings of clubs or organizations: Not on file    Relationship status: Not on file  . Intimate partner violence:    Fear of current or ex partner: Not on  file    Emotionally abused: Not on file    Physically abused: Not on file    Forced sexual activity: Not on file  Other Topics Concern  . Not on file  Social History Narrative   From [place]. Relocated to this area from [facility].   Family History  Problem Relation Age of Onset  . Cancer Mother        uterine deceased age 39  . Hypertension Sister   . Arrhythmia Other        Atrial fibrillation  . Hypertension Maternal Grandmother     Objective: Office vital signs reviewed. BP 140/80 Comment: manual  Pulse (!) 54   Temp 97.8 F (36.6 C) (Oral)   Ht 5' 11"  (1.803 m)   Wt 172 lb (78 kg)   BMI 23.99 kg/m   Physical Examination:  General: Awake, alert, well nourished, No acute distress HEENT: Normal, MMM Cardio: bradycardic, rhythm seems regular. S1S2 heard, no murmurs appreciated Pulm: clear to auscultation bilaterally, no wheezes, rhonchi or rales; normal work of breathing on room air Extremities: warm, well perfused, No edema, cyanosis or clubbing; +2 pulses bilaterally  Assessment/ Plan: 85 y.o. male   1. Essential hypertension, benign Controlled upon manual recheck.  Continue current regimen.  Follow up in 6 months, sooner if needed. - CBC; Future - lisinopril (PRINIVIL,ZESTRIL) 10 MG tablet; Take 1 tablet (10 mg total) by mouth daily.  Dispense: 90 tablet; Refill: 2  2. Paroxysmal atrial fibrillation (HCC) Rate controlled.  3. CKD (chronic kidney disease) stage 3, GFR 30-59 ml/min (HCC) No recent gout flares.  He is doing well - CMP14+EGFR; Future - allopurinol (ZYLOPRIM) 100 MG tablet; Take 1 tablet (100 mg total) by mouth daily.  Dispense: 90 tablet; Refill: 3 - lisinopril (PRINIVIL,ZESTRIL) 10 MG tablet; Take 1 tablet (10 mg total) by mouth daily.  Dispense: 90 tablet; Refill: 2  4. Prostate cancer ([place]) - PSA; Future - Testosterone; Future  5. Leukocytosis, unspecified type - CBC; Future   Orders Placed This Encounter  Procedures  . CBC     Standing Status:   Future    Standing Expiration Date:   11/21/2019  . CMP14+EGFR    Standing Status:   Future    Standing Expiration Date:   11/21/2019  . PSA    Standing Status:   Future    Standing Expiration Date:   11/21/2019    Order Specific Question:   CC Results    Answer:   Erik Mullins [  [id number]]  . Testosterone    Standing Status:   Future    Standing Expiration Date:   11/21/2019    Order Specific Question:   CC Results    Answer:   Erik Mullins [[id number]]   Meds ordered this encounter  Medications  . allopurinol (ZYLOPRIM) 100 MG tablet    Sig: Take 1 tablet (100 mg total) by mouth daily.    Dispense:  90 tablet    Refill:  3  . lisinopril (PRINIVIL,ZESTRIL) 10 MG tablet    Sig: Take 1 tablet (10 mg total) by mouth daily.    Dispense:  90 tablet    Refill:  [place], [place] [phone]

## 2018-11-21 ENCOUNTER — Ambulatory Visit (INDEPENDENT_AMBULATORY_CARE_PROVIDER_SITE_OTHER): Payer: Medicare Other | Admitting: Family Medicine

## 2018-11-21 ENCOUNTER — Encounter: Payer: Self-pay | Admitting: Family Medicine

## 2018-11-21 VITALS — BP 140/80 | HR 54 | Temp 97.8°F | Ht 71.0 in | Wt 172.0 lb

## 2018-11-21 DIAGNOSIS — C61 Malignant neoplasm of prostate: Secondary | ICD-10-CM

## 2018-11-21 DIAGNOSIS — I480 Paroxysmal atrial fibrillation: Secondary | ICD-10-CM | POA: Diagnosis not present

## 2018-11-21 DIAGNOSIS — I10 Essential (primary) hypertension: Secondary | ICD-10-CM

## 2018-11-21 DIAGNOSIS — N183 Chronic kidney disease, stage 3 unspecified: Secondary | ICD-10-CM

## 2018-11-21 DIAGNOSIS — D72829 Elevated white blood cell count, unspecified: Secondary | ICD-10-CM

## 2018-11-21 MED ORDER — LISINOPRIL 10 MG PO TABS: 10.0000 mg | ORAL_TABLET | Freq: Every day | ORAL | 2 refills | Status: DC

## 2018-11-21 MED ORDER — ALLOPURINOL 100 MG PO TABS: 100.0000 mg | ORAL_TABLET | Freq: Every day | ORAL | 3 refills | Status: DC

## 2018-11-21 NOTE — Patient Instructions (Addendum)
Come in tomorrow for fasting labs.  The testosterone has to be collected at 8am if possible. I will send the results to Dr Ralene [place] office.

## 2018-11-22 ENCOUNTER — Other Ambulatory Visit: Payer: Medicare Other

## 2018-11-22 DIAGNOSIS — I10 Essential (primary) hypertension: Secondary | ICD-10-CM

## 2018-11-22 DIAGNOSIS — N183 Chronic kidney disease, stage 3 unspecified: Secondary | ICD-10-CM

## 2018-11-22 DIAGNOSIS — C61 Malignant neoplasm of prostate: Secondary | ICD-10-CM | POA: Diagnosis not present

## 2018-11-22 DIAGNOSIS — D72829 Elevated white blood cell count, unspecified: Secondary | ICD-10-CM

## 2018-11-23 LAB — CMP14+EGFR
ALBUMIN: 4.2 g/dL (ref 3.6–4.6)
ALT: 11 IU/L (ref 0–44)
AST: 20 IU/L (ref 0–40)
Albumin/Globulin Ratio: 1.8 (ref 1.2–2.2)
Alkaline Phosphatase: 88 IU/L (ref 39–117)
BUN / CREAT RATIO: 14 (ref 10–24)
BUN: 25 mg/dL (ref 8–27)
Bilirubin Total: 0.2 mg/dL (ref 0.0–1.2)
CHLORIDE: 103 mmol/L (ref 96–106)
CO2: 26 mmol/L (ref 20–29)
CREATININE: 1.74 mg/dL — AB (ref 0.76–1.27)
Calcium: 9.8 mg/dL (ref 8.6–10.2)
GFR calc non Af Amer: 35 mL/min/{1.73_m2} — ABNORMAL LOW (ref >59)
GFR, EST AFRICAN AMERICAN: 40 mL/min/{1.73_m2} — AB (ref >59)
GLUCOSE: 90 mg/dL (ref 65–99)
Globulin, Total: 2.4 g/dL (ref 1.5–4.5)
Potassium: 5.2 mmol/L (ref 3.5–5.2)
Sodium: 141 mmol/L (ref 134–144)
TOTAL PROTEIN: 6.6 g/dL (ref 6.0–8.5)

## 2018-11-23 LAB — CBC
HEMATOCRIT: 43.2 % (ref 37.5–51.0)
Hemoglobin: 14.8 g/dL (ref 13.0–17.7)
MCH: 29.4 pg (ref 26.6–33.0)
MCHC: 34.3 g/dL (ref 31.5–35.7)
MCV: 86 fL (ref 79–97)
Platelets: 171 10*3/uL (ref 150–450)
RBC: 5.03 x10E6/uL (ref 4.14–5.80)
RDW: 13.1 % (ref 11.6–15.4)
WBC: 7.2 10*3/uL (ref 3.4–10.8)

## 2018-11-23 LAB — TESTOSTERONE: Testosterone: 16 ng/dL — ABNORMAL LOW (ref 264–916)

## 2018-11-23 LAB — PSA: Prostate Specific Ag, Serum: 0.4 ng/mL (ref 0.0–4.0)

## 2019-01-19 ENCOUNTER — Ambulatory Visit (INDEPENDENT_AMBULATORY_CARE_PROVIDER_SITE_OTHER): Payer: Medicare Other | Admitting: Family Medicine

## 2019-01-19 ENCOUNTER — Other Ambulatory Visit: Payer: Self-pay

## 2019-01-19 DIAGNOSIS — W57XXXA Bitten or stung by nonvenomous insect and other nonvenomous arthropods, initial encounter: Secondary | ICD-10-CM | POA: Diagnosis not present

## 2019-01-19 DIAGNOSIS — L989 Disorder of the skin and subcutaneous tissue, unspecified: Secondary | ICD-10-CM | POA: Diagnosis not present

## 2019-01-19 MED ORDER — DOXYCYCLINE HYCLATE 100 MG PO TABS: 100.0000 mg | ORAL_TABLET | Freq: Two times a day (BID) | ORAL | 0 refills | Status: DC

## 2019-01-19 NOTE — Progress Notes (Signed)
Telephone visit  Subjective: CC: tick bite PCP: Janora Norlander, DO [id number] Erik Mullins is a 85 y.o. male calls for telephone consult today. Patient provides verbal consent for consult held via phone.  Location of patient: home Location of provider: [facility] Others present for call: wife  1. Tick bite Patient's wife provides much of the history as patient is hard of hearing and visit is being conducted over the telephone.  She reports that about 1-1/2 weeks ago he sustained a tick bite to the left ear.  She notes that she was easily able to detach the tick from his ear but that he has subsequently developed a drainage from the area where he was bitten.  She denies any increased warmth, associated rash, fevers, chills, joint pain.  She has been applying over-the-counter antibiotic cream but this is not helping.  He describes itching.   ROS: Per HPI  Allergies  Allergen Reactions  . Morphine Other (See Comments)    hallucinate   Past Medical History:  Diagnosis Date  . AAA (abdominal aortic aneurysm) ([place])    Remote ~1994   . Anemia   . Arthritis   . Atrial fibrillation [facility])    Diagnosed December 2014  . AVNRT (AV nodal re-entry tachycardia) ([place])    Possible  . Cataract    Bilateral  . Cirrhosis ([place])   . CKD (chronic kidney disease) stage 3, GFR 30-59 ml/min (HCC)   . Essential hypertension, benign   . Gout   . History of GI bleed   . History of gout   . History of hepatitis   . History of stroke   . Hyperlipidemia   . PAD (peripheral artery disease) (HCC)    Stenting in lower extremities (no records)  . Prostate cancer ([place])    XRT; in remission  . Type 2 diabetes mellitus (HCC)     Current Outpatient Medications:  .  albuterol (PROVENTIL HFA;VENTOLIN HFA) 108 (90 Base) MCG/ACT inhaler, INHALE ONE TO TWO PUFFS BY MOUTH EVERY 6 HOURS AS NEEDED FOR WHEEZING FOR SHORTNESS OF BREATH, Disp: 18 Inhaler, Rfl: 2 .  allopurinol (ZYLOPRIM) 100 MG tablet, Take 1 tablet (100 mg  total) by mouth daily., Disp: 90 tablet, Rfl: 3 .  Calcium Carbonate-Vitamin D (CALTRATE 600+D) 600-400 MG-UNIT per tablet, Take 1 tablet by mouth daily.  , Disp: , Rfl:  .  lisinopril (PRINIVIL,ZESTRIL) 10 MG tablet, Take 1 tablet (10 mg total) by mouth daily., Disp: 90 tablet, Rfl: 2 .  metoprolol tartrate (LOPRESSOR) 25 MG tablet, Take 0.5 tablets (12.5 mg total) by mouth 2 (two) times daily., Disp: 90 tablet, Rfl: 3 .  Multiple Vitamin (MULTIVITAMIN) tablet, Take 1 tablet by mouth daily., Disp: , Rfl:  .  XARELTO 15 MG TABS tablet, TAKE 1 TABLET (15 MG TOTAL) BY MOUTH DAILY WITH SUPPER., Disp: 90 tablet, Rfl: 0  Assessment/ Plan: 85 y.o. male   1. Tick bite, initial encounter Unlikely to be tickborne illness but given the exudative nature of the skin lesion will empirically treat with oral antibiotics.  Doxycycline 100 mg p.o. twice daily for 10 days prescribed.  I reviewed the home care instructions and reasons for reevaluation.  His wife was good understanding.  They will follow-up PRN - doxycycline (VIBRA-TABS) 100 MG tablet; Take 1 tablet (100 mg total) by mouth 2 (two) times daily.  Dispense: 20 tablet; Refill: 0  2. Exudative skin lesion As above - doxycycline (VIBRA-TABS) 100 MG tablet; Take 1 tablet (100 mg total)  by mouth 2 (two) times daily.  Dispense: 20 tablet; Refill: 0   Start time: 12:42pm End time: 12:45pm  Total time spent on patient care (including telephone call/ virtual visit): 14 minutes  [place], [place] [phone]

## 2019-03-03 ENCOUNTER — Other Ambulatory Visit: Payer: Self-pay | Admitting: *Deleted

## 2019-03-03 ENCOUNTER — Ambulatory Visit (INDEPENDENT_AMBULATORY_CARE_PROVIDER_SITE_OTHER): Payer: Medicare Other | Admitting: Urology

## 2019-03-03 DIAGNOSIS — R9721 Rising PSA following treatment for malignant neoplasm of prostate: Secondary | ICD-10-CM | POA: Diagnosis not present

## 2019-03-03 DIAGNOSIS — C61 Malignant neoplasm of prostate: Secondary | ICD-10-CM | POA: Diagnosis not present

## 2019-03-03 MED ORDER — ALBUTEROL SULFATE HFA 108 (90 BASE) MCG/ACT IN AERS: 1.0000 | INHALATION_SPRAY | Freq: Four times a day (QID) | RESPIRATORY_TRACT | 2 refills | Status: DC | PRN

## 2019-03-08 ENCOUNTER — Other Ambulatory Visit: Payer: Self-pay | Admitting: Family Medicine

## 2019-03-08 DIAGNOSIS — N183 Chronic kidney disease, stage 3 unspecified: Secondary | ICD-10-CM

## 2019-03-08 DIAGNOSIS — C61 Malignant neoplasm of prostate: Secondary | ICD-10-CM

## 2019-03-08 NOTE — Progress Notes (Signed)
Wife aware

## 2019-03-09 ENCOUNTER — Other Ambulatory Visit: Payer: Self-pay

## 2019-03-09 ENCOUNTER — Other Ambulatory Visit: Payer: Medicare Other

## 2019-03-09 DIAGNOSIS — N183 Chronic kidney disease, stage 3 unspecified: Secondary | ICD-10-CM

## 2019-03-09 DIAGNOSIS — C61 Malignant neoplasm of prostate: Secondary | ICD-10-CM | POA: Diagnosis not present

## 2019-03-10 LAB — BASIC METABOLIC PANEL
BUN/Creatinine Ratio: 17 (ref 10–24)
BUN: 31 mg/dL — ABNORMAL HIGH (ref 8–27)
CO2: 25 mmol/L (ref 20–29)
Calcium: 9.7 mg/dL (ref 8.6–10.2)
Chloride: 102 mmol/L (ref 96–106)
Creatinine, Ser: 1.84 mg/dL — ABNORMAL HIGH (ref 0.76–1.27)
GFR calc Af Amer: 38 mL/min/{1.73_m2} — ABNORMAL LOW (ref >59)
GFR calc non Af Amer: 33 mL/min/{1.73_m2} — ABNORMAL LOW (ref >59)
Glucose: 97 mg/dL (ref 65–99)
Potassium: 4.9 mmol/L (ref 3.5–5.2)
Sodium: 142 mmol/L (ref 134–144)

## 2019-03-10 LAB — TESTOSTERONE: Testosterone: 11 ng/dL — ABNORMAL LOW (ref 264–916)

## 2019-03-10 LAB — PSA: Prostate Specific Ag, Serum: 0.7 ng/mL (ref 0.0–4.0)

## 2019-04-08 ENCOUNTER — Other Ambulatory Visit: Payer: Self-pay | Admitting: Cardiology

## 2019-04-18 ENCOUNTER — Telehealth: Payer: Self-pay | Admitting: Cardiology

## 2019-04-18 NOTE — Telephone Encounter (Signed)
Virtual Visit Pre-Appointment Phone Call  "(Name), I am calling you today to discuss your upcoming appointment. We are currently trying to limit exposure to the virus that causes COVID-19 by seeing patients at home rather than in the office."  1. "What is the BEST phone number to call the day of the visit?" - include this in appointment notes  2. Do you have or have access to (through a family member/friend) a smartphone with video capability that we can use for your visit?" a. If yes - list this number in appt notes as cell (if different from BEST phone #) and list the appointment type as a VIDEO visit in appointment notes b. If no - list the appointment type as a PHONE visit in appointment notes  3. Confirm consent - "In the setting of the current Covid19 crisis, you are scheduled for a (phone or video) visit with your provider on (date) at (time).  Just as we do with many in-office visits, in order for you to participate in this visit, we must obtain consent.  If you'd like, I can send this to your mychart (if signed up) or email for you to review.  Otherwise, I can obtain your verbal consent now.  All virtual visits are billed to your insurance company just like a normal visit would be.  By agreeing to a virtual visit, we'd like you to understand that the technology does not allow for your provider to perform an examination, and thus may limit your provider's ability to fully assess your condition. If your provider identifies any concerns that need to be evaluated in person, we will make arrangements to do so.  Finally, though the technology is pretty good, we cannot assure that it will always work on either your or our end, and in the setting of a video visit, we may have to convert it to a phone-only visit.  In either situation, we cannot ensure that we have a secure connection.  Are you willing to proceed?" STAFF: Did the patient verbally acknowledge consent to telehealth visit? Document  YES/NO here: yes  4. Advise patient to be prepared - "Two hours prior to your appointment, go ahead and check your blood pressure, pulse, oxygen saturation, and your weight (if you have the equipment to check those) and write them all down. When your visit starts, your provider will ask you for this information. If you have an Apple Watch or Kardia device, please plan to have heart rate information ready on the day of your appointment. Please have a pen and paper handy nearby the day of the visit as well."  5. Give patient instructions for MyChart download to smartphone OR [email or web address] as below if video visit (depending on what platform provider is using)  6. Inform patient they will receive a phone call 15 minutes prior to their appointment time (may be from unknown caller ID) so they should be prepared to answer    TELEPHONE CALL NOTE  DIO GILLER has been deemed a candidate for a follow-up tele-health visit to limit community exposure during the Covid-19 pandemic. I spoke with the patient via phone to ensure availability of phone/video source, confirm preferred email & phone number, and discuss instructions and expectations.  I reminded CORRIN HINGLE to be prepared with any vital sign and/or heart rhythm information that could potentially be obtained via home monitoring, at the time of his visit. I reminded CORNELLIUS KROPP to expect a phone call prior to  his visit.  MANAV PIEROTTI 04/18/2019 11:01 AM   INSTRUCTIONS FOR DOWNLOADING THE MYCHART APP TO SMARTPHONE  - The patient must first make sure to have activated MyChart and know their login information - If Apple, go to [employer] and type in MyChart in the search bar and download the app. If Android, ask patient to go to [employer] and type in [place] in the search bar and download the app. The app is free but as with any other app downloads, their phone may require them to verify saved payment information or Apple/Android  password.  - The patient will need to then log into the app with their MyChart username and password, and select  as their healthcare provider to link the account. When it is time for your visit, go to the MyChart app, find appointments, and click Begin Video Visit. Be sure to Select Allow for your device to access the Microphone and Camera for your visit. You will then be connected, and your provider will be with you shortly.  **If they have any issues connecting, or need assistance please contact MyChart service desk [phone])**  **If using a computer, in order to ensure the best quality for their visit they will need to use either of the following Internet Browsers: [employer], or Google Chrome**  IF USING DOXIMITY or DOXY.ME - The patient will receive a link just prior to their visit by text.     FULL LENGTH CONSENT FOR TELE-HEALTH VISIT   I hereby voluntarily request, consent and authorize [facility] and its employed or contracted physicians, physician assistants, nurse practitioners or other licensed health care professionals (the Practitioner), to provide me with telemedicine health care services (the Services") as deemed necessary by the treating Practitioner. I acknowledge and consent to receive the Services by the Practitioner via telemedicine. I understand that the telemedicine visit will involve communicating with the Practitioner through live audiovisual communication technology and the disclosure of certain medical information by electronic transmission. I acknowledge that I have been given the opportunity to request an in-person assessment or other available alternative prior to the telemedicine visit and am voluntarily participating in the telemedicine visit.  I understand that I have the right to withhold or withdraw my consent to the use of telemedicine in the course of my care at any time, without affecting my right to future care or treatment,  and that the Practitioner or I may terminate the telemedicine visit at any time. I understand that I have the right to inspect all information obtained and/or recorded in the course of the telemedicine visit and may receive copies of available information for a reasonable fee.  I understand that some of the potential risks of receiving the Services via telemedicine include:   Delay or interruption in medical evaluation due to technological equipment failure or disruption;  Information transmitted may not be sufficient (e.g. poor resolution of images) to allow for appropriate medical decision making by the Practitioner; and/or   In rare instances, security protocols could fail, causing a breach of personal health information.  Furthermore, I acknowledge that it is my responsibility to provide information about my medical history, conditions and care that is complete and accurate to the best of my ability. I acknowledge that Practitioner's advice, recommendations, and/or decision may be based on factors not within their control, such as incomplete or inaccurate data provided by me or distortions of diagnostic images or specimens that may result from electronic transmissions. I  understand that the practice of medicine is not an exact science and that Practitioner makes no warranties or guarantees regarding treatment outcomes. I acknowledge that I will receive a copy of this consent concurrently upon execution via email to the email address I last provided but may also request a printed copy by calling the office of [facility].    I understand that my insurance will be billed for this visit.   I have read or had this consent read to me.  I understand the contents of this consent, which adequately explains the benefits and risks of the Services being provided via telemedicine.   I have been provided ample opportunity to ask questions regarding this consent and the Services and have had my questions  answered to my satisfaction.  I give my informed consent for the services to be provided through the use of telemedicine in my medical care  By participating in this telemedicine visit I agree to the above.   Virtual Visit Pre-Appointment Phone Call  "(Name), I am calling you today to discuss your upcoming appointment. We are currently trying to limit exposure to the virus that causes COVID-19 by seeing patients at home rather than in the office."  7. "What is the BEST phone number to call the day of the visit?" - include this in appointment notes  8. Do you have or have access to (through a family member/friend) a smartphone with video capability that we can use for your visit?" a. If yes - list this number in appt notes as cell (if different from BEST phone #) and list the appointment type as a VIDEO visit in appointment notes b. If no - list the appointment type as a PHONE visit in appointment notes  9. Confirm consent - "In the setting of the current Covid19 crisis, you are scheduled for a (phone or video) visit with your provider on (date) at (time).  Just as we do with many in-office visits, in order for you to participate in this visit, we must obtain consent.  If you'd like, I can send this to your mychart (if signed up) or email for you to review.  Otherwise, I can obtain your verbal consent now.  All virtual visits are billed to your insurance company just like a normal visit would be.  By agreeing to a virtual visit, we'd like you to understand that the technology does not allow for your provider to perform an examination, and thus may limit your provider's ability to fully assess your condition. If your provider identifies any concerns that need to be evaluated in person, we will make arrangements to do so.  Finally, though the technology is pretty good, we cannot assure that it will always work on either your or our end, and in the setting of a video visit, we may have to convert it  to a phone-only visit.  In either situation, we cannot ensure that we have a secure connection.  Are you willing to proceed?" STAFF: Did the patient verbally acknowledge consent to telehealth visit? Document YES/NO here: yes  10. Advise patient to be prepared - "Two hours prior to your appointment, go ahead and check your blood pressure, pulse, oxygen saturation, and your weight (if you have the equipment to check those) and write them all down. When your visit starts, your provider will ask you for this information. If you have an Apple Watch or Kardia device, please plan to have heart rate information ready on the day  of your appointment. Please have a pen and paper handy nearby the day of the visit as well."  11. Give patient instructions for MyChart download to smartphone OR Doximity/Doxy.me as below if video visit (depending on what platform provider is using)  12. Inform patient they will receive a phone call 15 minutes prior to their appointment time (may be from unknown caller ID) so they should be prepared to answer    TELEPHONE CALL NOTE  Erik Mullins has been deemed a candidate for a follow-up tele-health visit to limit community exposure during the Covid-19 pandemic. I spoke with the patient via phone to ensure availability of phone/video source, confirm preferred email & phone number, and discuss instructions and expectations.  I reminded Erik Mullins to be prepared with any vital sign and/or heart rhythm information that could potentially be obtained via home monitoring, at the time of his visit. I reminded Erik Mullins to expect a phone call prior to his visit.  RONNIE DOO 04/18/2019 11:01 AM   INSTRUCTIONS FOR DOWNLOADING THE MYCHART APP TO SMARTPHONE  - The patient must first make sure to have activated MyChart and know their login information - If Apple, go to [employer] and type in MyChart in the search bar and download the app. If Android, ask patient to go to [employer] and type in [place] in the search bar and download the app. The app is free but as with any other app downloads, their phone may require them to verify saved payment information or Apple/Android password.  - The patient will need to then log into the app with their MyChart username and password, and select [facility] as their healthcare provider to link the account. When it is time for your visit, go to the MyChart app, find appointments, and click Begin Video Visit. Be sure to Select Allow for your device to access the Microphone and Camera for your visit. You will then be connected, and your provider will be with you shortly.  **If they have any issues connecting, or need assistance please contact MyChart service desk [phone])**  **If using a computer, in order to ensure the best quality for their visit they will need to use either of the following Internet Browsers: [employer], or Google Chrome**  IF USING DOXIMITY or DOXY.ME - The patient will receive a link just prior to their visit by text.     FULL LENGTH CONSENT FOR TELE-HEALTH VISIT   I hereby voluntarily request, consent and authorize [facility] and its employed or contracted physicians, physician assistants, nurse practitioners or other licensed health care professionals (the Practitioner), to provide me with telemedicine health care services (the Services") as deemed necessary by the treating Practitioner. I acknowledge and consent to receive the Services by the Practitioner via telemedicine. I understand that the telemedicine visit will involve communicating with the Practitioner through live audiovisual communication technology and the disclosure of certain medical information by electronic transmission. I acknowledge that I have been given the opportunity to request an in-person assessment or other available alternative prior to the telemedicine visit and am voluntarily participating in the  telemedicine visit.  I understand that I have the right to withhold or withdraw my consent to the use of telemedicine in the course of my care at any time, without affecting my right to future care or treatment, and that the Practitioner or I may terminate the telemedicine visit at any time. I understand that I have  the right to inspect all information obtained and/or recorded in the course of the telemedicine visit and may receive copies of available information for a reasonable fee.  I understand that some of the potential risks of receiving the Services via telemedicine include:   Delay or interruption in medical evaluation due to technological equipment failure or disruption;  Information transmitted may not be sufficient (e.g. poor resolution of images) to allow for appropriate medical decision making by the Practitioner; and/or   In rare instances, security protocols could fail, causing a breach of personal health information.  Furthermore, I acknowledge that it is my responsibility to provide information about my medical history, conditions and care that is complete and accurate to the best of my ability. I acknowledge that Practitioner's advice, recommendations, and/or decision may be based on factors not within their control, such as incomplete or inaccurate data provided by me or distortions of diagnostic images or specimens that may result from electronic transmissions. I understand that the practice of medicine is not an exact science and that Practitioner makes no warranties or guarantees regarding treatment outcomes. I acknowledge that I will receive a copy of this consent concurrently upon execution via email to the email address I last provided but may also request a printed copy by calling the office of [facility].    I understand that my insurance will be billed for this visit.   I have read or had this consent read to me.  I understand the contents of this consent, which  adequately explains the benefits and risks of the Services being provided via telemedicine.   I have been provided ample opportunity to ask questions regarding this consent and the Services and have had my questions answered to my satisfaction.  I give my informed consent for the services to be provided through the use of telemedicine in my medical care  By participating in this telemedicine visit I agree to the above.

## 2019-04-19 ENCOUNTER — Encounter: Payer: Self-pay | Admitting: Cardiology

## 2019-04-19 ENCOUNTER — Telehealth (INDEPENDENT_AMBULATORY_CARE_PROVIDER_SITE_OTHER): Payer: Medicare Other | Admitting: Cardiology

## 2019-04-19 DIAGNOSIS — I739 Peripheral vascular disease, unspecified: Secondary | ICD-10-CM | POA: Diagnosis not present

## 2019-04-19 DIAGNOSIS — I480 Paroxysmal atrial fibrillation: Secondary | ICD-10-CM | POA: Diagnosis not present

## 2019-04-19 DIAGNOSIS — N183 Chronic kidney disease, stage 3 unspecified: Secondary | ICD-10-CM

## 2019-04-19 DIAGNOSIS — I10 Essential (primary) hypertension: Secondary | ICD-10-CM

## 2019-04-19 NOTE — Progress Notes (Signed)
Virtual Visit via Telephone Note   This visit type was conducted due to national recommendations for restrictions regarding the COVID-19 Pandemic (e.g. social distancing) in an effort to limit this patient's exposure and mitigate transmission in our community.  Due to his co-morbid illnesses, this patient is at least at moderate risk for complications without adequate follow up.  This format is felt to be most appropriate for this patient at this time.  The patient did not have access to video technology/had technical difficulties with video requiring transitioning to audio format only (telephone).  All issues noted in this document were discussed and addressed.  No physical exam could be performed with this format.  Please refer to the patient's chart for his  consent to telehealth for [facility].   Date:  04/19/2019   ID:  Erik Mullins, DOB [birth year], MRN [id number]  Patient Location: Home Provider Location: Office  PCP:  Janora Norlander, DO  Cardiologist:  Rozann Lesches, MD Electrophysiologist:  None   Evaluation Performed:  Follow-Up Visit  Chief Complaint:   Cardiac follow-up  History of Present Illness:    Erik Mullins is an 85 y.o. male last seen in October 2019.  He did not have video access and we spoke by phone today.  Family member also on speaker phone with him.  He states that he feels well.  He enjoys playing golf during the week.  He is able to play anywhere from 9-18 holes.  He does not report any palpitations or chest pain.  I reviewed his most recent lab work which is outlined below.  Xarelto was appropriately dosed.  He denies any bleeding issues.  He is also on Lopressor.  The patient does not have symptoms concerning for COVID-19 infection (fever, chills, cough, or new shortness of breath).    Past Medical History:  Diagnosis Date  . AAA (abdominal aortic aneurysm) ([place])    Remote ~1994   . Anemia   . Arthritis   . Atrial fibrillation [facility])    Diagnosed December 2014  . AVNRT (AV nodal re-entry tachycardia) ([place])    Possible  . Cataract    Bilateral  . Cirrhosis ([place])   . CKD (chronic kidney disease) stage 3, GFR 30-59 ml/min (HCC)   . Essential hypertension, benign   . Gout   . History of GI bleed   . History of gout   . History of hepatitis   . History of stroke   . Hyperlipidemia   . PAD (peripheral artery disease) ([facility])    Stenting in lower extremities (no records)  . Prostate cancer ([place])    XRT; in remission  . Type 2 diabetes mellitus ([place])    Past Surgical History:  Procedure Laterality Date  . ABDOMINAL AORTIC ANEURYSM REPAIR  1980's   in [place]  . APPENDECTOMY    . CAROTID ENDARTERECTOMY Right ~ 1999  . CATARACT EXTRACTION W/ INTRAOCULAR LENS  IMPLANT, BILATERAL Bilateral 2014  . CHOLECYSTECTOMY    . COLON SURGERY    . PARTIAL COLECTOMY     "I was bleeding to death inside; took out 2/3 of my colon" (08/22/2013)     Current Meds  Medication Sig  . albuterol (VENTOLIN HFA) 108 (90 Base) MCG/ACT inhaler Inhale 1-2 puffs into the lungs every 6 (six) hours as needed for wheezing or shortness of breath.  . allopurinol (ZYLOPRIM) 100 MG tablet Take 1 tablet (100 mg total) by mouth daily.  . Calcium Carbonate-Vitamin D (  CALTRATE 600+D) 600-400 MG-UNIT per tablet Take 1 tablet by mouth daily.    Marland Kitchen lisinopril (PRINIVIL,ZESTRIL) 10 MG tablet Take 1 tablet (10 mg total) by mouth daily.  . metoprolol tartrate (LOPRESSOR) 25 MG tablet TAKE 1/2 TABLET (12.5 MG TOTAL) BY MOUTH 2 (TWO) TIMES DAILY.  . Multiple Vitamin (MULTIVITAMIN) tablet Take 1 tablet by mouth daily.  Alveda Reasons 15 MG TABS tablet TAKE 1 TABLET (15 MG TOTAL) BY MOUTH DAILY WITH SUPPER.     Allergies:   Morphine   Social History   Tobacco Use  . Smoking status: Current Every Day Smoker    Packs/day: 0.50    Years: 70.00    Pack years: 35.00    Types: Cigarettes    Start date: 09/11/1942    Last attempt to quit: 08/21/2013    Years  since quitting: 5.6  . Smokeless tobacco: Former Systems developer    Types: Snuff, Chew  . Tobacco comment: smokes [employer] cigarettes while golfing and pack last 5 days  Substance Use Topics  . Alcohol use: Yes    Alcohol/week: 0.0 standard drinks    Comment: 08/22/2013 "drink ~ 1 beer and wine/yr"  . Drug use: No     Family Hx: The patient's family history includes Arrhythmia in an other family member; Cancer in his mother; Hypertension in his maternal grandmother and sister.  ROS:   Please see the history of present illness.    Memory problems. All other systems reviewed and are negative.   Prior CV studies:   The following studies were reviewed today:  Echocardiogram 08/23/2013: Study Conclusions  Left ventricle: The cavity size was normal. Wall thickness was normal. Systolic function was normal. The estimated ejection fraction was in the range of 60% to 65%.  Labs/Other Tests and Data Reviewed:    EKG:  An ECG dated 06/15/2018 was personally reviewed today and demonstrated:  Sinus rhythm, low voltage in the limb leads.  Recent Labs: 11/22/2018: ALT 11; Hemoglobin 14.8; Platelets 171 03/09/2019: BUN 31; Creatinine, Ser 1.84; Potassium 4.9; Sodium 142   Recent Lipid Panel Lab Results  Component Value Date/Time   CHOL 194 11/29/2017 09:05 AM   CHOL 122 02/07/2013 09:03 AM   TRIG 187 (H) 11/29/2017 09:05 AM   TRIG 168 (H) 10/24/2013 09:09 AM   TRIG 140 02/07/2013 09:03 AM   HDL 58 11/29/2017 09:05 AM   HDL 44 10/24/2013 09:09 AM   HDL 41 02/07/2013 09:03 AM   CHOLHDL 3.3 11/29/2017 09:05 AM   LDLCALC 99 11/29/2017 09:05 AM   LDLCALC 69 10/24/2013 09:09 AM   LDLCALC 53 02/07/2013 09:03 AM    Wt Readings from Last 3 Encounters:  04/19/19 163 lb (73.9 kg)  11/21/18 172 lb (78 kg)  06/15/18 162 lb 3.2 oz (73.6 kg)     Objective:    Vital Signs:  BP 126/74   Pulse 83   Ht 5\' 11"  (1.803 m)   Wt 163 lb (73.9 kg)   BMI 22.73 kg/m    Patient spoke in full sentences, not  short of breath. No audible wheezing or coughing. Speech pattern normal.  ASSESSMENT & PLAN:    1.  Paroxysmal atrial fibrillation.  He reports no progressive palpitations and we will plan to continue Lopressor along with renally dosed Xarelto.  2.  CKD stage III, most recent creatinine 1.84.  3.  Essential hypertension, systolic is in the 169C today.  No changes made.  Keep follow-up with PCP.  4.  PAD with  prior percutaneous interventions.  No reported claudication with current activities.  COVID-19 Education: The signs and symptoms of COVID-19 were discussed with the patient and how to seek care for testing (follow up with PCP or arrange E-visit).  The importance of social distancing was discussed today.  Time:   Today, I have spent 6 minutes with the patient with telehealth technology discussing the above problems.     Medication Adjustments/Labs and Tests Ordered: Current medicines are reviewed at length with the patient today.  Concerns regarding medicines are outlined above.   Tests Ordered: No orders of the defined types were placed in this encounter.   Medication Changes: No orders of the defined types were placed in this encounter.   Follow Up:  In Person 6 months in the [place] office.  Signed, Rozann Lesches, MD  04/19/2019 1:22 PM    [facility]

## 2019-04-19 NOTE — Patient Instructions (Addendum)

## 2019-05-26 ENCOUNTER — Ambulatory Visit: Payer: Medicare Other | Admitting: Family Medicine

## 2019-05-26 ENCOUNTER — Ambulatory Visit (INDEPENDENT_AMBULATORY_CARE_PROVIDER_SITE_OTHER): Payer: Medicare Other | Admitting: Family Medicine

## 2019-05-26 ENCOUNTER — Other Ambulatory Visit: Payer: Self-pay

## 2019-05-26 ENCOUNTER — Encounter: Payer: Self-pay | Admitting: Family Medicine

## 2019-05-26 VITALS — BP 128/74 | HR 54 | Temp 96.8°F | Resp 18 | Ht 71.0 in | Wt 161.0 lb

## 2019-05-26 DIAGNOSIS — N183 Chronic kidney disease, stage 3 unspecified: Secondary | ICD-10-CM

## 2019-05-26 DIAGNOSIS — R413 Other amnesia: Secondary | ICD-10-CM

## 2019-05-26 DIAGNOSIS — I10 Essential (primary) hypertension: Secondary | ICD-10-CM | POA: Diagnosis not present

## 2019-05-26 DIAGNOSIS — Z23 Encounter for immunization: Secondary | ICD-10-CM | POA: Diagnosis not present

## 2019-05-26 DIAGNOSIS — H9193 Unspecified hearing loss, bilateral: Secondary | ICD-10-CM | POA: Diagnosis not present

## 2019-05-26 DIAGNOSIS — I480 Paroxysmal atrial fibrillation: Secondary | ICD-10-CM | POA: Diagnosis not present

## 2019-05-26 LAB — BASIC METABOLIC PANEL
BUN/Creatinine Ratio: 11 (ref 10–24)
BUN: 19 mg/dL (ref 8–27)
CO2: 27 mmol/L (ref 20–29)
Calcium: 9.9 mg/dL (ref 8.6–10.2)
Chloride: 101 mmol/L (ref 96–106)
Creatinine, Ser: 1.69 mg/dL — ABNORMAL HIGH (ref 0.76–1.27)
GFR calc Af Amer: 42 mL/min/{1.73_m2} — ABNORMAL LOW (ref >59)
GFR calc non Af Amer: 36 mL/min/{1.73_m2} — ABNORMAL LOW (ref >59)
Glucose: 104 mg/dL — ABNORMAL HIGH (ref 65–99)
Potassium: 5.2 mmol/L (ref 3.5–5.2)
Sodium: 142 mmol/L (ref 134–144)

## 2019-05-26 NOTE — Patient Instructions (Addendum)
His mental exam did NOT show dementia.  We talked about the ways to reduce memory loss today.  Enjoy the golfing!  Regarding mood, we discussed that Erik Mullins is here in office to help with counseling if you desire, just call and we can arrange this.  There are also medications that can help if needed.  Keep me posted.   Memory Compensation Strategies  1. Use "WARM" strategy.  W= write it down  A= associate it  R= repeat it  M= make a mental note  2.   You can keep a Social worker.  Use a 3-ring notebook with sections for the following: calendar, important names and phone numbers,  medications, doctors' names/phone numbers, lists/reminders, and a section to journal what you did  each day.   3.    Use a calendar to write appointments down.  4.    Write yourself a schedule for the day.  This can be placed on the calendar or in a separate section of the Memory Notebook.  Keeping a  regular schedule can help memory.  5.    Use medication organizer with sections for each day or morning/evening pills.  You may need help loading it  6.    Keep a basket, or pegboard by the door.  Place items that you need to take out with you in the basket or on the pegboard.  You may also want to  include a message board for reminders.  7.    Use sticky notes.  Place sticky notes with reminders in a place where the task is performed.  For example: " turn off the  stove" placed by the stove, "lock the door" placed on the door at eye level, " take your medications" on  the bathroom mirror or by the place where you normally take your medications.  8.    Use alarms/timers.  Use while cooking to remind yourself to check on food or as a reminder to take your medicine, or as a  reminder to make a call, or as a reminder to perform another task, etc.

## 2019-05-26 NOTE — Progress Notes (Signed)
Subjective: CC: memory, hearing PCP: Janora Norlander, DO Erik Mullins is a 85 y.o. male presenting to clinic today for:  1.  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  He is expected to see his hearing specialist on Monday but he requests to have his ears checked out today to see if there is any buildup contributing to his decreased hearing.  He reports that occasionally the left side will have some soft discharge where the right side has hard cerumen.  2.  Memory His wife accompanies him today and notes that he has had changes in his memory for the last several months.  She notes that he will often forgets things that they discussed a couple hours prior and overall the short-term memory seems to be impaired.  She notes he is easily able to recollect things from the past that are far back.  He has no difficulty with continence, ambulation, self care including feeding.  He is able to manage finances and does not get lost.  He has started using a calendar to keep up with days of the week.  3.  Atrial fibrillation/CKD 3/hypertension Patient is compliant with Xarelto and metoprolol.  He also takes lisinopril for blood pressure and CKD 3.  No chest pain, shortness of breath, change in exercise tolerance.  Does not report any bleeding.   ROS: Per HPI  Allergies  Allergen Reactions  . Morphine Other (See Comments)    hallucinate   Past Medical History:  Diagnosis Date  . AAA (abdominal aortic aneurysm) ([place])    Remote ~1994   . Anemia   . Arthritis   . Atrial fibrillation [facility])    Diagnosed December 2014  . AVNRT (AV nodal re-entry tachycardia) ([place])    Possible  . Cataract    Bilateral  . Cirrhosis ([place])   . CKD (chronic kidney disease) stage 3, GFR 30-59 ml/min (HCC)   . Essential hypertension, benign   . Gout   . History of GI bleed   . History of gout   . History of hepatitis   . History of stroke   . Hyperlipidemia   . PAD  (peripheral artery disease) (HCC)    Stenting in lower extremities (no records)  . Prostate cancer ([place])    XRT; in remission  . Type 2 diabetes mellitus (HCC)     Current Outpatient Medications:  .  albuterol (VENTOLIN HFA) 108 (90 Base) MCG/ACT inhaler, Inhale 1-2 puffs into the lungs every 6 (six) hours as needed for wheezing or shortness of breath., Disp: 18 g, Rfl: 2 .  allopurinol (ZYLOPRIM) 100 MG tablet, Take 1 tablet (100 mg total) by mouth daily., Disp: 90 tablet, Rfl: 3 .  Calcium Carbonate-Vitamin D (CALTRATE 600+D) 600-400 MG-UNIT per tablet, Take 1 tablet by mouth daily.  , Disp: , Rfl:  .  docusate sodium (COLACE) 100 MG capsule, Take 100 mg by mouth daily., Disp: , Rfl:  .  lisinopril (PRINIVIL,ZESTRIL) 10 MG tablet, Take 1 tablet (10 mg total) by mouth daily., Disp: 90 tablet, Rfl: 2 .  metoprolol tartrate (LOPRESSOR) 25 MG tablet, TAKE 1/2 TABLET (12.5 MG TOTAL) BY MOUTH 2 (TWO) TIMES DAILY., Disp: 90 tablet, Rfl: 1 .  Multiple Vitamin (MULTIVITAMIN) tablet, Take 1 tablet by mouth daily., Disp: , Rfl:  .  XARELTO 15 MG TABS tablet, TAKE 1 TABLET (15 MG TOTAL) BY MOUTH DAILY WITH SUPPER., Disp: 90 tablet, Rfl: 0 Social History  Socioeconomic History  . Marital status: Married    Spouse name: Not on file  . Number of children: Not on file  . Years of education: Not on file  . Highest education level: Not on file  Occupational History  . Not on file  Social Needs  . Financial resource strain: Not on file  . Food insecurity    Worry: Not on file    Inability: Not on file  . Transportation needs    Medical: Not on file    Non-medical: Not on file  Tobacco Use  . Smoking status: Current Every Day Smoker    Packs/day: 0.50    Years: 70.00    Pack years: 35.00    Types: Cigarettes    Start date: 09/11/1942    Last attempt to quit: 08/21/2013    Years since quitting: 5.7  . Smokeless tobacco: Former Systems developer    Types: Snuff, Chew  . Tobacco comment: smokes [employer]  cigarettes while golfing and pack last 5 days  Substance and Sexual Activity  . Alcohol use: Yes    Alcohol/week: 0.0 standard drinks    Comment: 08/22/2013 "drink ~ 1 beer and wine/yr"  . Drug use: No  . Sexual activity: Never  Lifestyle  . Physical activity    Days per week: Not on file    Minutes per session: Not on file  . Stress: Not on file  Relationships  . Social Herbalist on phone: Not on file    Gets together: Not on file    Attends religious service: Not on file    Active member of club or organization: Not on file    Attends meetings of clubs or organizations: Not on file    Relationship status: Not on file  . Intimate partner violence    Fear of current or ex partner: Not on file    Emotionally abused: Not on file    Physically abused: Not on file    Forced sexual activity: Not on file  Other Topics Concern  . Not on file  Social History Narrative   From [place]. Relocated to this area from [facility].   Family History  Problem Relation Age of Onset  . Cancer Mother        uterine deceased age 47  . Hypertension Sister   . Arrhythmia Other        Atrial fibrillation  . Hypertension Maternal Grandmother     Objective: Office vital signs reviewed. BP 128/74   Pulse (!) 54   Temp (!) 96.8 F (36 C)   Resp 18   Ht 5\' 11"  (1.803 m)   Wt 161 lb (73 kg)   SpO2 96%   BMI 22.45 kg/m   Physical Examination:  General: Awake, alert, well nourished, well appearing. No acute distress HEENT: Normal, sclera white, MMM; right external auditory canal with hard, scaly cerumen noted Cardio: bradycardic but seemingly regular rhythm today, S1S2 heard, no murmurs appreciated Pulm: clear to auscultation bilaterally, no wheezes, rhonchi or rales; normal work of breathing on room air Extremities: warm, well perfused, No edema, cyanosis or clubbing; +2 pulses bilaterally Neuro: Alert and oriented x3.  MMSE as below.  Depression screen [facility] 2/9 05/26/2019  11/21/2018 05/23/2018  Decreased Interest 0 0 0  Down, Depressed, Hopeless 1 0 0  PHQ - 2 Score 1 0 0  Altered sleeping - 0 -  Tired, decreased energy - 0 -  Change in appetite - 0 -  Feeling bad or failure about yourself  - 0 -  Trouble concentrating - 0 -  Moving slowly or fidgety/restless - 0 -  Suicidal thoughts - 0 -  PHQ-9 Score - 0 -   MMSE - Mini Mental State Exam 05/26/2019 10/04/2017  Orientation to time 5 2  Orientation to Place 5 4  Registration 3 2  Attention/ Calculation 4 5  Recall 0 3  Language- name 2 objects 2 2  Language- repeat 1 1  Language- follow 3 step command 3 3  Language- read & follow direction 1 1  Write a sentence 1 1  Copy design 1 1  Total score 26 25   Assessment/ Plan: 85 y.o. male   1. Memory loss His MMSE score is stable/slightly improved from his check in 2019.  We discussed ways to reduce the risk of cognitive impairment including increased social activity (within reason given COVID-19), balanced diet, physical activity, blood pressure control.  We will continue to monitor intervally and low threshold for referral to neurology if progresses rapidly  2. Bilateral deafness Has follow-up with audiology.  He had a small amount of hard cerumen noted in the right external auditory canal today which was removed with irrigation.  3. Paroxysmal atrial fibrillation (HCC) Rate controlled.  No bleeding  4. Essential hypertension, benign Controlled.  Continue current regimen  5. CKD (chronic kidney disease) stage 3, GFR 30-59 ml/min (HCC) Check BMP given slight rise in creatinine on panel in June - Basic Metabolic Panel  6. Need for immunization against influenza Administered today. - Flu Vaccine QUAD High Dose(Fluad)   Orders Placed This Encounter  Procedures  . Flu Vaccine QUAD High Dose(Fluad)  . Basic Metabolic Panel   No orders of the defined types were placed in this encounter.    Janora Norlander, DO [place] [phone]

## 2019-08-28 ENCOUNTER — Other Ambulatory Visit: Payer: Self-pay

## 2019-08-29 ENCOUNTER — Encounter: Payer: Self-pay | Admitting: Family Medicine

## 2019-08-29 ENCOUNTER — Ambulatory Visit (INDEPENDENT_AMBULATORY_CARE_PROVIDER_SITE_OTHER): Payer: Medicare Other | Admitting: Family Medicine

## 2019-08-29 VITALS — BP 138/88 | HR 85 | Temp 96.8°F | Ht 71.0 in | Wt 164.0 lb

## 2019-08-29 DIAGNOSIS — R238 Other skin changes: Secondary | ICD-10-CM

## 2019-08-29 DIAGNOSIS — N1832 Chronic kidney disease, stage 3b: Secondary | ICD-10-CM

## 2019-08-29 DIAGNOSIS — R413 Other amnesia: Secondary | ICD-10-CM

## 2019-08-29 DIAGNOSIS — I480 Paroxysmal atrial fibrillation: Secondary | ICD-10-CM | POA: Diagnosis not present

## 2019-08-29 DIAGNOSIS — I10 Essential (primary) hypertension: Secondary | ICD-10-CM | POA: Diagnosis not present

## 2019-08-29 DIAGNOSIS — C61 Malignant neoplasm of prostate: Secondary | ICD-10-CM | POA: Diagnosis not present

## 2019-08-29 DIAGNOSIS — R233 Spontaneous ecchymoses: Secondary | ICD-10-CM

## 2019-08-29 NOTE — Progress Notes (Signed)
Subjective: CC: memory, CKD3, afib PCP: Janora Norlander, DO PYK:DXIP Erik Mullins is a 85 y.o. male presenting to clinic today for:  1.  Memory His wife notes that his memory continues to decline.  He has had no falls.  She does not report any aggressive behaviors.  She notes they are not ready to start any medication.  2.  Atrial fibrillation/CKD 3/hypertension Patient is compliant with Xarelto and metoprolol.  He also takes lisinopril for blood pressure and CKD 3.  No chest pain, shortness of breath, change in exercise tolerance.  He had some prolonged bleeding recently picked up his skin of his left lower leg.  His wife bandaged it but it tended to ooze for a while.  She is wondering if there is a lower dose of Xarelto he can go on.  He sees Dr. Domenic Polite for A. fib.  3.  History of prostate cancer Patient sees Dr. Jeffie Pollock for monitoring of this.  They bring in a laboratory order for testosterone and PSA that needs to be routed to him once resulted.  He does not have any urinary complaints.  He denies hematuria, dysuria.  ROS: Per HPI  Allergies  Allergen Reactions  . Morphine Other (See Comments)    hallucinate   Past Medical History:  Diagnosis Date  . AAA (abdominal aortic aneurysm) ([place])    Remote ~1994   . Anemia   . Arthritis   . Atrial fibrillation [facility])    Diagnosed December 2014  . AVNRT (AV nodal re-entry tachycardia) ([place])    Possible  . Cataract    Bilateral  . Cirrhosis ([place])   . CKD (chronic kidney disease) stage 3, GFR 30-59 ml/min (HCC)   . Essential hypertension, benign   . Gout   . History of GI bleed   . History of gout   . History of hepatitis   . History of stroke   . Hyperlipidemia   . PAD (peripheral artery disease) (HCC)    Stenting in lower extremities (no records)  . Prostate cancer ([place])    XRT; in remission  . Type 2 diabetes mellitus (HCC)     Current Outpatient Medications:  .  albuterol (VENTOLIN HFA) 108 (90 Base) MCG/ACT inhaler,  Inhale 1-2 puffs into the lungs every 6 (six) hours as needed for wheezing or shortness of breath., Disp: 18 g, Rfl: 2 .  allopurinol (ZYLOPRIM) 100 MG tablet, Take 1 tablet (100 mg total) by mouth daily., Disp: 90 tablet, Rfl: 3 .  Calcium Carbonate-Vitamin D (CALTRATE 600+D) 600-400 MG-UNIT per tablet, Take 1 tablet by mouth daily.  , Disp: , Rfl:  .  docusate sodium (COLACE) 100 MG capsule, Take 100 mg by mouth daily., Disp: , Rfl:  .  lisinopril (PRINIVIL,ZESTRIL) 10 MG tablet, Take 1 tablet (10 mg total) by mouth daily., Disp: 90 tablet, Rfl: 2 .  metoprolol tartrate (LOPRESSOR) 25 MG tablet, TAKE 1/2 TABLET (12.5 MG TOTAL) BY MOUTH 2 (TWO) TIMES DAILY., Disp: 90 tablet, Rfl: 1 .  Multiple Vitamin (MULTIVITAMIN) tablet, Take 1 tablet by mouth daily., Disp: , Rfl:  .  XARELTO 15 MG TABS tablet, TAKE 1 TABLET (15 MG TOTAL) BY MOUTH DAILY WITH SUPPER., Disp: 90 tablet, Rfl: 0 Social History   Socioeconomic History  . Marital status: Married    Spouse name: Not on file  . Number of children: Not on file  . Years of education: Not on file  . Highest education level: Not on file  Occupational History  . Not on file  Tobacco Use  . Smoking status: Current Every Day Smoker    Packs/day: 0.50    Years: 70.00    Pack years: 35.00    Types: Cigarettes    Start date: 09/11/1944    Last attempt to quit: 08/21/2013    Years since quitting: 6.0  . Smokeless tobacco: Former Systems developer    Types: Snuff, Chew  . Tobacco comment: smokes [employer] cigarettes while golfing and pack last 5 days  Substance and Sexual Activity  . Alcohol use: Yes    Alcohol/week: 0.0 standard drinks    Comment: 08/22/2013 "drink ~ 1 beer and wine/yr"  . Drug use: No  . Sexual activity: Never  Other Topics Concern  . Not on file  Social History Narrative   From [place]. Relocated to this area from [facility].   Social Determinants of Health   Financial Resource Strain:   . Difficulty of Paying Living Expenses: Not  on file  Food Insecurity:   . Worried About Charity fundraiser in the Last Year: Not on file  . Ran Out of Food in the Last Year: Not on file  Transportation Needs:   . Lack of Transportation (Medical): Not on file  . Lack of Transportation (Non-Medical): Not on file  Physical Activity:   . Days of Exercise per Week: Not on file  . Minutes of Exercise per Session: Not on file  Stress:   . Feeling of Stress : Not on file  Social Connections:   . Frequency of Communication with Friends and Family: Not on file  . Frequency of Social Gatherings with Friends and Family: Not on file  . Attends Religious Services: Not on file  . Active Member of Clubs or Organizations: Not on file  . Attends Archivist Meetings: Not on file  . Marital Status: Not on file  Intimate Partner Violence:   . Fear of Current or Ex-Partner: Not on file  . Emotionally Abused: Not on file  . Physically Abused: Not on file  . Sexually Abused: Not on file   Family History  Problem Relation Age of Onset  . Cancer Mother        uterine deceased age 68  . Hypertension Sister   . Arrhythmia Other        Atrial fibrillation  . Hypertension Maternal Grandmother     Objective: Office vital signs reviewed. There were no vitals taken for this visit.  Physical Examination:  General: Awake, alert, well nourished, well appearing. No acute distress HEENT: He has a small bruise along the right temple, sclera white. MMM Cardio: bradycardic but seemingly regular rhythm, S1S2 heard, no murmurs appreciated Pulm: clear to auscultation bilaterally, no wheezes, rhonchi or rales; normal work of breathing on room air Extremities: warm, well perfused, No edema, cyanosis or clubbing; +2 pulses bilaterally Skin: He has a hemostatic lesion along the left lateral ankle that is covered with a bandage. Neuro: Difficulty hearing despite use of hearing aids.  Depression screen [facility] 2/9 08/29/2019 05/26/2019 11/21/2018   Decreased Interest 0 0 0  Down, Depressed, Hopeless 0 1 0  PHQ - 2 Score 0 1 0  Altered sleeping 0 - 0  Tired, decreased energy 0 - 0  Change in appetite 0 - 0  Feeling bad or failure about yourself  0 - 0  Trouble concentrating 0 - 0  Moving slowly or fidgety/restless 0 - 0  Suicidal thoughts 0 - 0  PHQ-9 Score 0 - 0    Assessment/ Plan: 85 y.o. male   1. Paroxysmal atrial fibrillation (HCC) Rate controlled. - CBC  2. Easy bruisability Having some easy bruising and bleeding with Xarelto 15 mg.  Unfortunately, the 10 mg dose is not indicated for atrial fibrillation.  I will CC his cardiologist just to make him aware of the worry about increased bruising lately.  Perhaps they may have some thoughts on alternatives that would have less effect.  3. Essential hypertension, benign Controlled.  Continue current regimen - Basic Metabolic Panel  4. Stage 3b chronic kidney disease Check BMP - Basic Metabolic Panel - CBC  5. Prostate cancer [facility]) Obtain labs for Dr. Jeffie Pollock, cc once resulted - PSA - Testosterone,Free and Total  6. Memory loss Wife feels that there is a slight decline but she did not want to start any memory medications.  I encouraged her to read more about Aricept and Namenda as possibilities to stop progression.  She will follow-up in 3 months, sooner if needed    No orders of the defined types were placed in this encounter.  No orders of the defined types were placed in this encounter.    Janora Norlander, DO [place] [phone]

## 2019-08-29 NOTE — Patient Instructions (Signed)
We talked about 2 medications which may help stall memory loss: Namenda and Aricept.    I will copy results of your labs to Dr Jeffie Pollock.  I will reach out to Dr Domenic Polite about lowering the dose of Xarelto given frequent bleeds/ bruising.

## 2019-08-30 LAB — CBC
Hematocrit: 50.1 % (ref 37.5–51.0)
Hemoglobin: 16.5 g/dL (ref 13.0–17.7)
MCH: 29.6 pg (ref 26.6–33.0)
MCHC: 32.9 g/dL (ref 31.5–35.7)
MCV: 90 fL (ref 79–97)
Platelets: 177 10*3/uL (ref 150–450)
RBC: 5.58 x10E6/uL (ref 4.14–5.80)
RDW: 13.4 % (ref 11.6–15.4)
WBC: 8 10*3/uL (ref 3.4–10.8)

## 2019-08-30 LAB — BASIC METABOLIC PANEL
BUN/Creatinine Ratio: 13 (ref 10–24)
BUN: 24 mg/dL (ref 8–27)
CO2: 23 mmol/L (ref 20–29)
Calcium: 9.9 mg/dL (ref 8.6–10.2)
Chloride: 104 mmol/L (ref 96–106)
Creatinine, Ser: 1.78 mg/dL — ABNORMAL HIGH (ref 0.76–1.27)
GFR calc Af Amer: 39 mL/min/{1.73_m2} — ABNORMAL LOW (ref >59)
GFR calc non Af Amer: 34 mL/min/{1.73_m2} — ABNORMAL LOW (ref >59)
Glucose: 96 mg/dL (ref 65–99)
Potassium: 5.2 mmol/L (ref 3.5–5.2)
Sodium: 145 mmol/L — ABNORMAL HIGH (ref 134–144)

## 2019-08-30 LAB — PSA: Prostate Specific Ag, Serum: 0.8 ng/mL (ref 0.0–4.0)

## 2019-08-30 LAB — TESTOSTERONE,FREE AND TOTAL
Testosterone, Free: 3.5 pg/mL — ABNORMAL LOW (ref 6.6–18.1)
Testosterone: 12 ng/dL — ABNORMAL LOW (ref 264–916)

## 2019-09-01 ENCOUNTER — Ambulatory Visit (INDEPENDENT_AMBULATORY_CARE_PROVIDER_SITE_OTHER): Payer: Medicare Other | Admitting: Urology

## 2019-09-01 ENCOUNTER — Encounter: Payer: Self-pay | Admitting: Urology

## 2019-09-01 VITALS — BP 140/81 | HR 86 | Temp 98.1°F | Ht 70.5 in | Wt 162.0 lb

## 2019-09-01 DIAGNOSIS — R9721 Rising PSA following treatment for malignant neoplasm of prostate: Secondary | ICD-10-CM

## 2019-09-01 DIAGNOSIS — C61 Malignant neoplasm of prostate: Secondary | ICD-10-CM | POA: Diagnosis not present

## 2019-09-01 LAB — POCT URINALYSIS DIPSTICK
Bilirubin, UA: NEGATIVE
Blood, UA: NEGATIVE
Glucose, UA: NEGATIVE
Ketones, UA: NEGATIVE
Leukocytes, UA: NEGATIVE
Nitrite, UA: NEGATIVE
Protein, UA: POSITIVE — AB
Spec Grav, UA: 1.02 (ref 1.010–1.025)
Urobilinogen, UA: NEGATIVE E.U./dL — AB
pH, UA: 6 (ref 5.0–8.0)

## 2019-09-01 MED ORDER — LEUPROLIDE ACETATE (6 MONTH) 45 MG IM KIT: 45.0000 mg | PACK | Freq: Once | INTRAMUSCULAR | Status: DC

## 2019-09-01 MED ORDER — LEUPROLIDE ACETATE (6 MONTH) 45 MG IM KIT
45.0000 mg | PACK | Freq: Once | INTRAMUSCULAR | Status: AC
  Administered 2019-09-01: 45 mg via INTRAMUSCULAR

## 2019-09-01 NOTE — Assessment & Plan Note (Addendum)
PSA is rising with a PSADT of 9 months.  I discussed consideration of an oncology referral for consideration of second line therapies but at this time they would like to f/u in 6 months with a PSA and I will consider restaging at that time.

## 2019-09-01 NOTE — Progress Notes (Signed)
Subjective:  1. Prostate cancer ([place])   2. Rising PSA following treatment for malignant neoplasm of prostate      [name] returns today in f/u for his history of prostate cancer that was diagnosed in 4/10 and he completed radiation therapy on 08/16/09.  He had gleason 9 disease intiallly.  He had a PSA recurrence and was started on Lupron after his PSA rose to 9.9 in 6/17 from 3.6 in 12/16.  A bone scan and CT in 7/17 showed no mets.   His PSA nadired at 0.2 on Lupron in 9/18 but has been rising and was 0.4 in 3/20 and it is 0.8 prior to this visit.  His testosterone has remained castrate. His last Lupron injection was on 03/03/19.   He is doing well without other associated signs or symptoms.       ROS:  Review of Systems  Constitutional: Negative for weight loss.  HENT: Positive for hearing loss.   Respiratory: Positive for shortness of breath.   Cardiovascular: Negative for chest pain.  Gastrointestinal: Negative for blood in stool, constipation and diarrhea.  Genitourinary: Negative for hematuria and urgency.       Nocturia 2-3x.   Musculoskeletal: Negative.     Allergies  Allergen Reactions  . Morphine Other (See Comments)    hallucinate    Outpatient Encounter Medications as of 09/01/2019  Medication Sig  . albuterol (VENTOLIN HFA) 108 (90 Base) MCG/ACT inhaler Inhale 1-2 puffs into the lungs every 6 (six) hours as needed for wheezing or shortness of breath.  . allopurinol (ZYLOPRIM) 100 MG tablet Take 1 tablet (100 mg total) by mouth daily.  . Calcium Carbonate-Vitamin D (CALTRATE 600+D) 600-400 MG-UNIT per tablet Take 1 tablet by mouth daily.    Marland Kitchen docusate sodium (COLACE) 100 MG capsule Take 100 mg by mouth daily.  Marland Kitchen lisinopril (PRINIVIL,ZESTRIL) 10 MG tablet Take 1 tablet (10 mg total) by mouth daily.  . metoprolol tartrate (LOPRESSOR) 25 MG tablet TAKE 1/2 TABLET (12.5 MG TOTAL) BY MOUTH 2 (TWO) TIMES DAILY.  . Multiple Vitamin (MULTIVITAMIN) tablet Take 1 tablet by  mouth daily.  Alveda Reasons 15 MG TABS tablet TAKE 1 TABLET (15 MG TOTAL) BY MOUTH DAILY WITH SUPPER.   Facility-Administered Encounter Medications as of 09/01/2019  Medication  . Leuprolide Acetate (6 Month) (LUPRON) injection 45 mg  . [DISCONTINUED] Leuprolide Acetate (6 Month) (LUPRON) injection 45 mg    Past Medical History:  Diagnosis Date  . AAA (abdominal aortic aneurysm) ([place])    Remote ~1994   . Anemia   . Arthritis   . Atrial fibrillation [facility])    Diagnosed December 2014  . AVNRT (AV nodal re-entry tachycardia) ([place])    Possible  . Cataract    Bilateral  . Cirrhosis ([place])   . CKD (chronic kidney disease) stage 3, GFR 30-59 ml/min   . Essential hypertension, benign   . Gout   . History of GI bleed   . History of gout   . History of hepatitis   . History of stroke   . Hyperlipidemia   . PAD (peripheral artery disease) ([facility])    Stenting in lower extremities (no records)  . Prostate cancer ([place])    XRT; in remission  . Type 2 diabetes mellitus ([place])     Past Surgical History:  Procedure Laterality Date  . ABDOMINAL AORTIC ANEURYSM REPAIR  1980's   in [place]  . APPENDECTOMY    . CAROTID ENDARTERECTOMY Right ~ 1999  .  CATARACT EXTRACTION W/ INTRAOCULAR LENS  IMPLANT, BILATERAL Bilateral 2014  . CHOLECYSTECTOMY    . COLON SURGERY    . PARTIAL COLECTOMY     "I was bleeding to death inside; took out 2/3 of my colon" (08/22/2013)    Social History   Socioeconomic History  . Marital status: Married    Spouse name: Not on file  . Number of children: Not on file  . Years of education: Not on file  . Highest education level: Not on file  Occupational History  . Not on file  Tobacco Use  . Smoking status: Current Every Day Smoker    Packs/day: 0.25    Years: 70.00    Pack years: 17.50    Types: Cigarettes    Start date: 09/11/1944  . Smokeless tobacco: Former Systems developer    Types: Snuff, Chew  . Tobacco comment: smokes [employer] cigarettes while golfing and  pack last 5 days  Substance and Sexual Activity  . Alcohol use: Not Currently    Alcohol/week: 0.0 standard drinks    Comment: 08/22/2013 "drink ~ 1 beer and wine/yr"  . Drug use: No  . Sexual activity: Never  Other Topics Concern  . Not on file  Social History Narrative   From [place]. Relocated to this area from [facility]   Social Determinants of Health   Financial Resource Strain:   . Difficulty of Paying Living Expenses: Not on file  Food Insecurity:   . Worried About Charity fundraiser in the Last Year: Not on file  . Ran Out of Food in the Last Year: Not on file  Transportation Needs:   . Lack of Transportation (Medical): Not on file  . Lack of Transportation (Non-Medical): Not on file  Physical Activity:   . Days of Exercise per Week: Not on file  . Minutes of Exercise per Session: Not on file  Stress:   . Feeling of Stress : Not on file  Social Connections:   . Frequency of Communication with Friends and Family: Not on file  . Frequency of Social Gatherings with Friends and Family: Not on file  . Attends Religious Services: Not on file  . Active Member of Clubs or Organizations: Not on file  . Attends Archivist Meetings: Not on file  . Marital Status: Not on file  Intimate Partner Violence:   . Fear of Current or Ex-Partner: Not on file  . Emotionally Abused: Not on file  . Physically Abused: Not on file  . Sexually Abused: Not on file    Family History  Problem Relation Age of Onset  . Cancer Mother        uterine deceased age 76  . Hypertension Sister   . Arrhythmia Other        Atrial fibrillation  . Hypertension Maternal Grandmother        Objective: Vitals:   09/01/19 1554  BP: 140/81  Pulse: 86  Temp: 98.1 F (36.7 C)     Physical Exam Vitals reviewed.  Constitutional:      Appearance: Normal appearance.  Genitourinary:    Comments: AP without lesions. NST. No rectal mass. Prostate/SV smooth and flat.   Lymphadenopathy:     Cervical:     Right cervical: No superficial cervical adenopathy.    Left cervical: No superficial cervical adenopathy.     Upper Body:     Right upper body: No supraclavicular or axillary adenopathy.     Left upper body: No supraclavicular  or axillary adenopathy.  Neurological:     Mental Status: He is alert.     Lab Results:  No results for input(s): HGB, HCT, PLT in the last 72 hours.  Invalid input(s):  WBC BMET No results for input(s): NA, K, CL, CO2, GLUCOSE, BUN, CREATININE, CALCIUM in the last 72 hours. PSA Is up to 0.8 from 0.4 in 3/20.    Results for orders placed or performed in visit on 09/01/19 (from the past 24 hour(s))  POCT urinalysis dipstick     Status: Abnormal   Collection Time: 09/01/19  4:06 PM  Result Value Ref Range   Color, UA yellow    Clarity, UA clear    Glucose, UA Negative Negative   Bilirubin, UA negative    Ketones, UA negative    Spec Grav, UA 1.020 1.010 - 1.025   Blood, UA negative    pH, UA 6.0 5.0 - 8.0   Protein, UA Positive (A) Negative   Urobilinogen, UA negative (A) 0.2 or 1.0 E.U./dL   Nitrite, UA negative    Leukocytes, UA Negative Negative   Appearance clear    Odor       Studies/Results: No results found.    Assessment & Plan: Rising PSA following treatment for malignant neoplasm of prostate PSA is rising with a PSADT of 9 months.  I discussed consideration of an oncology referral for consideration of second line therapies but at this time they would like to f/u in 6 months with a PSA and I will consider restaging at that time.     Orders Placed This Encounter  Procedures  . Testosterone    Standing Status:   Future    Standing Expiration Date:   08/31/2020  . PSA    Standing Status:   Future    Standing Expiration Date:   08/31/2020  . POCT urinalysis dipstick      Return in about 6 months (around 03/01/2020).     CC: Janora Norlander, DO    Irine Seal 09/01/2019

## 2019-10-06 ENCOUNTER — Other Ambulatory Visit: Payer: Self-pay | Admitting: Cardiology

## 2019-10-16 ENCOUNTER — Encounter: Payer: Self-pay | Admitting: Cardiology

## 2019-10-16 ENCOUNTER — Ambulatory Visit (INDEPENDENT_AMBULATORY_CARE_PROVIDER_SITE_OTHER): Payer: Medicare Other | Admitting: Cardiology

## 2019-10-16 ENCOUNTER — Other Ambulatory Visit: Payer: Self-pay

## 2019-10-16 VITALS — BP 124/82 | HR 80 | Ht 71.0 in | Wt 169.0 lb

## 2019-10-16 DIAGNOSIS — I10 Essential (primary) hypertension: Secondary | ICD-10-CM | POA: Diagnosis not present

## 2019-10-16 DIAGNOSIS — I739 Peripheral vascular disease, unspecified: Secondary | ICD-10-CM

## 2019-10-16 DIAGNOSIS — I4819 Other persistent atrial fibrillation: Secondary | ICD-10-CM

## 2019-10-16 DIAGNOSIS — N1832 Chronic kidney disease, stage 3b: Secondary | ICD-10-CM

## 2019-10-16 DIAGNOSIS — I480 Paroxysmal atrial fibrillation: Secondary | ICD-10-CM | POA: Diagnosis not present

## 2019-10-16 NOTE — Patient Instructions (Signed)

## 2019-10-16 NOTE — Progress Notes (Signed)
Cardiology Office Note  Date: 10/16/2019   ID: Erik Mullins, DOB [birth year], MRN [id number]  PCP:  Janora Norlander, DO  Cardiologist:  Rozann Lesches, MD Electrophysiologist:  None   Chief Complaint  Patient presents with  . Cardiac follow-up    History of Present Illness: Erik Mullins is an 86 y.o. male last assessed via telehealth encounter in August 2020.  He is here today with his wife for a follow-up visit.  He reports no major change in stamina, still likes to get out on the golf course when the weather allows.  He has stable dyspnea on exertion, no angina symptoms.  Occasionally feels a sense of mild palpitations, but has had no syncope.  I reviewed his most recent lab work as outlined below.  He does report easy bruising on Xarelto, although dose is appropriate based on his renal function (creatinine clearance 32).  We will his medications which are otherwise stable and outlined below.  I personally reviewed his ECG today which shows rate controlled atrial fibrillation with nonspecific ST changes.  I also reviewed home blood pressure checks.  Past Medical History:  Diagnosis Date  . AAA (abdominal aortic aneurysm) ([place])    Remote ~1994   . Anemia   . Arthritis   . Atrial fibrillation [facility])    Diagnosed December 2014  . AVNRT (AV nodal re-entry tachycardia) ([place])    Possible  . Cataract    Bilateral  . Cirrhosis ([place])   . CKD (chronic kidney disease) stage 3, GFR 30-59 ml/min   . Essential hypertension, benign   . Gout   . History of GI bleed   . History of gout   . History of hepatitis   . History of stroke   . Hyperlipidemia   . PAD (peripheral artery disease) (H[facility])    Stenting in lower extremities (no records)  . Prostate cancer ([place])    XRT; in remission  . Type 2 diabetes mellitus ([place])     Past Surgical History:  Procedure Laterality Date  . ABDOMINAL AORTIC ANEURYSM REPAIR  1980's   in [place]  . APPENDECTOMY    . CAROTID  ENDARTERECTOMY Right ~ 1999  . CATARACT EXTRACTION W/ INTRAOCULAR LENS  IMPLANT, BILATERAL Bilateral 2014  . CHOLECYSTECTOMY    . COLON SURGERY    . PARTIAL COLECTOMY     "I was bleeding to death inside; took out 2/3 of my colon" (08/22/2013)    Current Outpatient Medications  Medication Sig Dispense Refill  . albuterol (VENTOLIN HFA) 108 (90 Base) MCG/ACT inhaler Inhale 1-2 puffs into the lungs every 6 (six) hours as needed for wheezing or shortness of breath. 18 g 2  . allopurinol (ZYLOPRIM) 100 MG tablet Take 1 tablet (100 mg total) by mouth daily. 90 tablet 3  . Calcium Carbonate-Vitamin D (CALTRATE 600+D) 600-400 MG-UNIT per tablet Take 1 tablet by mouth daily.      Marland Kitchen docusate sodium (COLACE) 100 MG capsule Take 100 mg by mouth daily.    Marland Kitchen lisinopril (PRINIVIL,ZESTRIL) 10 MG tablet Take 1 tablet (10 mg total) by mouth daily. 90 tablet 2  . metoprolol tartrate (LOPRESSOR) 25 MG tablet TAKE 1/2 TABLET (12.5 MG TOTAL) BY MOUTH 2 (TWO) TIMES DAILY. 90 tablet 1  . Multiple Vitamin (MULTIVITAMIN) tablet Take 1 tablet by mouth daily.    Alveda Reasons 15 MG TABS tablet TAKE 1 TABLET (15 MG TOTAL) BY MOUTH DAILY WITH SUPPER. 90 tablet 0   No  current facility-administered medications for this visit.   Allergies:  Morphine   Social History: The patient  reports that he has been smoking cigarettes. He started smoking about 75 years ago. He has a 17.50 pack-year smoking history. He has quit using smokeless tobacco.  His smokeless tobacco use included snuff and chew. He reports previous alcohol use. He reports that he does not use drugs.   ROS:  Please see the history of present illness. Otherwise, complete review of systems is positive for chronic hearing loss.  All other systems are reviewed and negative.   Physical Exam: VS:  BP 124/82   Pulse 80   Ht 5\' 11"  (1.803 m)   Wt 169 lb (76.7 kg)   SpO2 94%   BMI 23.57 kg/m , BMI Body mass index is 23.57 kg/m.  Wt Readings from Last 3 Encounters:   10/16/19 169 lb (76.7 kg)  09/01/19 162 lb (73.5 kg)  08/29/19 164 lb (74.4 kg)    General: Elderly male, appears comfortable at rest. HEENT: Conjunctiva and lids normal, wearing a mask. Neck: Supple, no elevated JVP or carotid bruits, no thyromegaly. Lungs: Clear to auscultation, nonlabored breathing at rest. Cardiac: Irregularly irregular, no S3 or significant systolic murmur. Abdomen: Soft, nontender, bowel sounds present. Extremities: No pitting edema, distal pulses 2+. Skin: Warm and dry. Musculoskeletal: No kyphosis. Neuropsychiatric: Alert and oriented x3, affect grossly appropriate.  ECG:  An ECG dated 06/15/2018 was personally reviewed today and demonstrated:  Sinus rhythm, low voltage in the limb leads.  Recent Labwork: 11/22/2018: ALT 11; AST 20 08/29/2019: BUN 24; Creatinine, Ser 1.78; Hemoglobin 16.5; Platelets 177; Potassium 5.2; Sodium 145     Component Value Date/Time   CHOL 194 11/29/2017 0905   CHOL 122 02/07/2013 0903   TRIG 187 (H) 11/29/2017 0905   TRIG 168 (H) 10/24/2013 0909   TRIG 140 02/07/2013 0903   HDL 58 11/29/2017 0905   HDL 44 10/24/2013 0909   HDL 41 02/07/2013 0903   CHOLHDL 3.3 11/29/2017 0905   LDLCALC 99 11/29/2017 0905   LDLCALC 69 10/24/2013 0909   LDLCALC 53 02/07/2013 0903    Other Studies Reviewed Today:  Echocardiogram 08/23/2013: Study Conclusions  Left ventricle: The cavity size was normal. Wall thickness was normal. Systolic function was normal. The estimated ejection fraction was in the range of 60% to 65%.  Assessment and Plan:  1.  Persistent atrial fibrillation.  Heart rate is controlled today on Lopressor and he remains on renally dosed Xarelto for stroke prophylaxis.  Lab work reviewed.  Continue with current plan.  2.  Stage IIIb, recent creatinine 1.78.  3.  Essential hypertension, systolic blood pressure in the 120s today.  No changes made to present regimen.  He is also on lisinopril.  4.  History of PAD and  previous AAA repair.  Asymptomatic and followed conservatively at this point.  Medication Adjustments/Labs and Tests Ordered: Current medicines are reviewed at length with the patient today.  Concerns regarding medicines are outlined above.   Tests Ordered: Orders Placed This Encounter  Procedures  . EKG 12-Lead    Medication Changes: No orders of the defined types were placed in this encounter.   Disposition:  Follow up 6 months in the [place] office.  Signed, Satira Sark, MD, [facility] 10/16/2019 1:37 PM    [facility], [place], [place] [address] Phone: [phone]; Fax: [phone]

## 2019-10-19 ENCOUNTER — Encounter: Payer: Self-pay | Admitting: Family Medicine

## 2019-10-19 ENCOUNTER — Ambulatory Visit (INDEPENDENT_AMBULATORY_CARE_PROVIDER_SITE_OTHER): Payer: Medicare Other | Admitting: Family Medicine

## 2019-10-19 ENCOUNTER — Other Ambulatory Visit: Payer: Self-pay

## 2019-10-19 VITALS — BP 148/80 | HR 84 | Temp 97.5°F | Ht 71.0 in | Wt 164.0 lb

## 2019-10-19 DIAGNOSIS — Z973 Presence of spectacles and contact lenses: Secondary | ICD-10-CM

## 2019-10-19 DIAGNOSIS — I480 Paroxysmal atrial fibrillation: Secondary | ICD-10-CM

## 2019-10-19 NOTE — Progress Notes (Signed)
Subjective: CC: DMV paperwork PCP: Janora Norlander, DO OTL:XBWI Erik Mullins is a 86 y.o. male presenting to clinic today for:  1.  DMV paperwork Patient is accompanied today by his wife who notes that he was seen at the [facility] for license renewal recently unfortunately was given DMV paperwork because he cannot pass the visual test.  He has since seen his eye doctor and got corrective lenses.  He is also been seen by his cardiologist who cleared him for driving.  His atrial fibrillation has been well controlled on current regimen.  He is not involved in any vehicle accidents but is extremely hard of hearing and they would not allow his wife to go back with him for the driving examination to interpret for him.  She notes that he is able to drive without difficulty but often just stays locally and goes to the golf course.   ROS: Per HPI  Allergies  Allergen Reactions  . Morphine Other (See Comments)    hallucinate   Past Medical History:  Diagnosis Date  . AAA (abdominal aortic aneurysm) ([place])    Remote ~1994   . Anemia   . Arthritis   . Atrial fibrillation [facility])    Diagnosed December 2014  . AVNRT (AV nodal re-entry tachycardia) ([place])    Possible  . Cataract    Bilateral  . Cirrhosis ([place])   . CKD (chronic kidney disease) stage 3, GFR 30-59 ml/min   . Essential hypertension, benign   . Gout   . History of GI bleed   . History of gout   . History of hepatitis   . History of stroke   . Hyperlipidemia   . PAD (peripheral artery disease) ([facility])    Stenting in lower extremities (no records)  . Prostate cancer ([place])    XRT; in remission  . Type 2 diabetes mellitus (H[facility])     Current Outpatient Medications:  .  albuterol (VENTOLIN HFA) 108 (90 Base) MCG/ACT inhaler, Inhale 1-2 puffs into the lungs every 6 (six) hours as needed for wheezing or shortness of breath., Disp: 18 g, Rfl: 2 .  allopurinol (ZYLOPRIM) 100 MG tablet, Take 1 tablet (100 mg total) by mouth daily., Disp: 90  tablet, Rfl: 3 .  Calcium Carbonate-Vitamin D (CALTRATE 600+D) 600-400 MG-UNIT per tablet, Take 1 tablet by mouth daily.  , Disp: , Rfl:  .  docusate sodium (COLACE) 100 MG capsule, Take 100 mg by mouth daily., Disp: , Rfl:  .  lisinopril (PRINIVIL,ZESTRIL) 10 MG tablet, Take 1 tablet (10 mg total) by mouth daily., Disp: 90 tablet, Rfl: 2 .  metoprolol tartrate (LOPRESSOR) 25 MG tablet, TAKE 1/2 TABLET (12.5 MG TOTAL) BY MOUTH 2 (TWO) TIMES DAILY., Disp: 90 tablet, Rfl: 1 .  Multiple Vitamin (MULTIVITAMIN) tablet, Take 1 tablet by mouth daily., Disp: , Rfl:  .  XARELTO 15 MG TABS tablet, TAKE 1 TABLET (15 MG TOTAL) BY MOUTH DAILY WITH SUPPER., Disp: 90 tablet, Rfl: 0 Social History   Socioeconomic History  . Marital status: Married    Spouse name: Not on file  . Number of children: Not on file  . Years of education: Not on file  . Highest education level: Not on file  Occupational History  . Not on file  Tobacco Use  . Smoking status: Current Every Day Smoker    Packs/day: 0.25    Years: 70.00    Pack years: 17.50    Types: Cigarettes    Start date:  09/11/1944  . Smokeless tobacco: Former Systems developer    Types: Snuff, Chew  Substance and Sexual Activity  . Alcohol use: Not Currently    Alcohol/week: 0.0 standard drinks    Comment: 08/22/2013 "drink ~ 1 beer and wine/yr"  . Drug use: No  . Sexual activity: Never  Other Topics Concern  . Not on file  Social History Narrative   From [place]. Relocated to this area from [facility].   Social Determinants of Health   Financial Resource Strain:   . Difficulty of Paying Living Expenses: Not on file  Food Insecurity:   . Worried About Charity fundraiser in the Last Year: Not on file  . Ran Out of Food in the Last Year: Not on file  Transportation Needs:   . Lack of Transportation (Medical): Not on file  . Lack of Transportation (Non-Medical): Not on file  Physical Activity:   . Days of Exercise per Week: Not on file  . Minutes  of Exercise per Session: Not on file  Stress:   . Feeling of Stress : Not on file  Social Connections:   . Frequency of Communication with Friends and Family: Not on file  . Frequency of Social Gatherings with Friends and Family: Not on file  . Attends Religious Services: Not on file  . Active Member of Clubs or Organizations: Not on file  . Attends Archivist Meetings: Not on file  . Marital Status: Not on file  Intimate Partner Violence:   . Fear of Current or Ex-Partner: Not on file  . Emotionally Abused: Not on file  . Physically Abused: Not on file  . Sexually Abused: Not on file   Family History  Problem Relation Age of Onset  . Cancer Mother        uterine deceased age 68  . Hypertension Sister   . Arrhythmia Other        Atrial fibrillation  . Hypertension Maternal Grandmother     Objective: Office vital signs reviewed. BP (!) 148/80   Pulse 84   Temp (!) 97.5 F (36.4 C) (Temporal)   Ht 5\' 11"  (1.803 m)   Wt 164 lb (74.4 kg)   SpO2 94%   BMI 22.87 kg/m   Physical Examination:  General: Awake, alert, well nourished, No acute distress Cardio: Rate controlled.  Ulcers present bilaterally.  Assessment/ Plan: 86 y.o. male   DMV paperwork was signed and completed.  I do not think he has any barriers to driving.  He has not had any accidents.  His atrial fibrillation has been stable.  He wears his corrective lenses.  1. Wears prescription eyeglasses I portion completed by his eye doctor Dr Marin Comment  2. Paroxysmal atrial fibrillation [facility]) Cardiology portion completed by Dr. Domenic Polite   No orders of the defined types were placed in this encounter.  No orders of the defined types were placed in this encounter.    Janora Norlander, DO [place] [phone]

## 2019-11-03 ENCOUNTER — Other Ambulatory Visit: Payer: Self-pay | Admitting: Family Medicine

## 2019-11-03 DIAGNOSIS — I10 Essential (primary) hypertension: Secondary | ICD-10-CM

## 2019-11-03 DIAGNOSIS — N183 Chronic kidney disease, stage 3 unspecified: Secondary | ICD-10-CM

## 2019-11-08 DIAGNOSIS — Z23 Encounter for immunization: Secondary | ICD-10-CM | POA: Diagnosis not present

## 2019-11-13 ENCOUNTER — Other Ambulatory Visit: Payer: Self-pay | Admitting: Family Medicine

## 2019-11-13 DIAGNOSIS — N183 Chronic kidney disease, stage 3 unspecified: Secondary | ICD-10-CM

## 2019-12-06 DIAGNOSIS — Z23 Encounter for immunization: Secondary | ICD-10-CM | POA: Diagnosis not present

## 2019-12-29 ENCOUNTER — Ambulatory Visit: Payer: Medicare Other | Admitting: Family Medicine

## 2020-01-05 ENCOUNTER — Other Ambulatory Visit: Payer: Self-pay

## 2020-01-05 ENCOUNTER — Encounter: Payer: Self-pay | Admitting: Family Medicine

## 2020-01-05 ENCOUNTER — Ambulatory Visit (INDEPENDENT_AMBULATORY_CARE_PROVIDER_SITE_OTHER): Payer: Medicare Other | Admitting: Family Medicine

## 2020-01-05 ENCOUNTER — Telehealth: Payer: Self-pay | Admitting: Family Medicine

## 2020-01-05 VITALS — BP 133/78 | HR 73 | Temp 97.5°F | Ht 71.0 in | Wt 168.2 lb

## 2020-01-05 DIAGNOSIS — J449 Chronic obstructive pulmonary disease, unspecified: Secondary | ICD-10-CM | POA: Diagnosis not present

## 2020-01-05 DIAGNOSIS — F0150 Vascular dementia without behavioral disturbance: Secondary | ICD-10-CM | POA: Diagnosis not present

## 2020-01-05 DIAGNOSIS — I480 Paroxysmal atrial fibrillation: Secondary | ICD-10-CM

## 2020-01-05 MED ORDER — MEMANTINE HCL 28 X 5 MG & 21 X 10 MG PO TABS: ORAL_TABLET | ORAL | 12 refills | Status: DC

## 2020-01-05 MED ORDER — ANORO ELLIPTA 62.5-25 MCG/INH IN AEPB: 1.0000 | INHALATION_SPRAY | Freq: Every day | RESPIRATORY_TRACT | 12 refills | Status: DC

## 2020-01-05 MED ORDER — MEMANTINE HCL 5 MG PO TABS: ORAL_TABLET | ORAL | 0 refills | Status: DC

## 2020-01-05 NOTE — Telephone Encounter (Signed)
Is there something else you would like to try? Please advise?

## 2020-01-05 NOTE — Telephone Encounter (Signed)
Replaced with 5mg  loose tablets.

## 2020-01-05 NOTE — Addendum Note (Signed)
Addended by: Janora Norlander on: 01/05/2020 01:52 PM   Modules accepted: Orders

## 2020-01-05 NOTE — Patient Instructions (Signed)
Dementia Caregiver Guide Dementia is a term used to describe a number of symptoms that affect memory and thinking. The most common symptoms include:  Memory loss.  Trouble with language and communication.  Trouble concentrating.  Poor judgment.  Problems with reasoning.  Child-like behavior and language.  Extreme anxiety.  Angry outbursts.  Wandering from home or public places. Dementia usually gets worse slowly over time. In the early stages, people with dementia can stay independent and safe with some help. In later stages, they need help with daily tasks such as dressing, grooming, and using the bathroom. How to help the person with dementia cope Dementia can be frightening and confusing. Here are some tips to help the person with dementia cope with changes caused by the disease. General tips  Keep the person on track with his or her routine.  Try to identify areas where the person may need help.  Be supportive, patient, calm, and encouraging.  Gently remind the person that adjusting to changes takes time.  Help with the tasks that the person has asked for help with.  Keep the person involved in daily tasks and decisions as much as possible.  Encourage conversation, but try not to get frustrated or harried if the person struggles to find words or does not seem to appreciate your help. Communication tips  When the person is talking or seems frustrated, make eye contact and hold the person's hand.  Ask specific questions that need yes or no answers.  Use simple words, short sentences, and a calm voice. Only give one direction at a time.  When offering choices, limit them to just 1 or 2.  Avoid correcting the person in a negative way.  If the person is struggling to find the right words, gently try to help him or her. How to recognize symptoms of stress Symptoms of stress in caregivers include:  Feeling frustrated or angry with the person with  dementia.  Denying that the person has dementia or that his or her symptoms will not improve.  Feeling hopeless and unappreciated.  Difficulty sleeping.  Difficulty concentrating.  Feeling anxious, irritable, or depressed.  Developing stress-related health problems.  Feeling like you have too little time for your own life. Follow these instructions at home:   Make sure that you and the person you are caring for: ? Get regular sleep. ? Exercise regularly. ? Eat regular, nutritious meals. ? Drink enough fluid to keep your urine clear or pale yellow. ? Take over-the-counter and prescription medicines only as told by your health care providers. ? Attend all scheduled health care appointments.  Join a support group with others who are caregivers.  Ask about respite care resources so that you can have a regular break from the stress of caregiving.  Look for signs of stress in yourself and in the person you are caring for. If you notice signs of stress, take steps to manage it.  Consider any safety risks and take steps to avoid them.  Organize medications in a pill box for each day of the week.  Create a plan to handle any legal or financial matters. Get legal or financial advice if needed.  Keep a calendar in a central location to remind the person of appointments or other activities. Tips for reducing the risk of injury  Keep floors clear of clutter. Remove rugs, magazine racks, and floor lamps.  Keep hallways well lit, especially at night.  Put a handrail and nonslip mat in the bathtub or  shower.  Put childproof locks on cabinets that contain dangerous items, such as medicines, alcohol, guns, toxic cleaning items, sharp tools or utensils, matches, and lighters.  Put the locks in places where the person cannot see or reach them easily. This will help ensure that the person does not wander out of the house and get lost.  Be prepared for emergencies. Keep a list of  emergency phone numbers and addresses in a convenient area.  Remove car keys and lock garage doors so that the person does not try to get in the car and drive.  Have the person wear a bracelet that tracks locations and identifies the person as having memory problems. This should be worn at all times for safety. Where to find support: Many individuals and organizations offer support. These include:  Support groups for people with dementia and for caregivers.  Counselors or therapists.  Home health care services.  Adult day care centers. Where to find more information Alzheimer's Association: [employer] Contact a health care provider if:  The person's health is rapidly getting worse.  You are no longer able to care for the person.  Caring for the person is affecting your physical and emotional health.  The person threatens himself or herself, you, or anyone else. Summary  Dementia is a term used to describe a number of symptoms that affect memory and thinking.  Dementia usually gets worse slowly over time.  Take steps to reduce the person's risk of injury, and to plan for future care.  Caregivers need support, relief from caregiving, and time for their own lives. This information is not intended to replace advice given to you by your health care provider. Make sure you discuss any questions you have with your health care provider. Document Revised: 08/13/2017 Document Reviewed: 08/04/2016 Elsevier Patient Education  2020 [employer].

## 2020-01-05 NOTE — Progress Notes (Signed)
Subjective: CC: f/u memory PCP: Janora Norlander, DO Erik Mullins is a 86 y.o. male presenting to clinic today for:  1. Memory loss Patient was last seen in December for this issue.  His MMSE score was stable/slightly improved.  We discussed the possibility of starting something like Namenda or Aricept.    Patient is accompanied today by his wife who notes that she still thinks that his memory is declining some.  Patient does admit that he often forgets things.  If he is interrupted then he will certainly lose his train of thought.  Sometimes he can walk in one room and out the other and forget what he went into the room for.  Has not gotten lost.  Continues to drive independently.  He passed a driver's test with [employer] recently.  2.  Shortness of breath Patient reports dyspnea on exertion with physical activities like golfing and mowing his grass.  He uses albuterol inhaler twice daily.  He has not been on a controller inhaler.  He admits that he was a smoker for many years.  No hemoptysis, unplanned weight loss, night sweats.  ROS: Per HPI  Allergies  Allergen Reactions  . Morphine Other (See Comments)    hallucinate   Past Medical History:  Diagnosis Date  . AAA (abdominal aortic aneurysm) ([place])    Remote ~1994   . Anemia   . Arthritis   . Atrial fibrillation [facility])    Diagnosed December 2014  . AVNRT (AV nodal re-entry tachycardia) ([place])    Possible  . Cataract    Bilateral  . Cirrhosis ([place])   . CKD (chronic kidney disease) stage 3, GFR 30-59 ml/min   . Essential hypertension, benign   . Gout   . History of GI bleed   . History of gout   . History of hepatitis   . History of stroke   . Hyperlipidemia   . PAD (peripheral artery disease) (HCC)    Stenting in lower extremities (no records)  . Prostate cancer ([place])    XRT; in remission  . Type 2 diabetes mellitus (HCC)     Current Outpatient Medications:  .  albuterol (VENTOLIN HFA) 108 (90 Base) MCG/ACT  inhaler, Inhale 1-2 puffs into the lungs every 6 (six) hours as needed for wheezing or shortness of breath., Disp: 18 g, Rfl: 2 .  allopurinol (ZYLOPRIM) 100 MG tablet, TAKE 1 TABLET BY MOUTH EVERY DAY, Disp: 90 tablet, Rfl: 3 .  Calcium Carbonate-Vitamin D (CALTRATE 600+D) 600-400 MG-UNIT per tablet, Take 1 tablet by mouth daily.  , Disp: , Rfl:  .  docusate sodium (COLACE) 100 MG capsule, Take 100 mg by mouth daily., Disp: , Rfl:  .  lisinopril (ZESTRIL) 10 MG tablet, TAKE 1 TABLET BY MOUTH EVERY DAY, Disp: 90 tablet, Rfl: 0 .  metoprolol tartrate (LOPRESSOR) 25 MG tablet, TAKE 1/2 TABLET (12.5 MG TOTAL) BY MOUTH 2 (TWO) TIMES DAILY., Disp: 90 tablet, Rfl: 1 .  Multiple Vitamin (MULTIVITAMIN) tablet, Take 1 tablet by mouth daily., Disp: , Rfl:  .  XARELTO 15 MG TABS tablet, TAKE 1 TABLET (15 MG TOTAL) BY MOUTH DAILY WITH SUPPER., Disp: 90 tablet, Rfl: 0 Social History   Socioeconomic History  . Marital status: Married    Spouse name: Not on file  . Number of children: Not on file  . Years of education: Not on file  . Highest education level: Not on file  Occupational History  . Not on file  Tobacco Use  . Smoking status: Current Every Day Smoker    Packs/day: 0.25    Years: 70.00    Pack years: 17.50    Types: Cigarettes    Start date: 09/11/1944  . Smokeless tobacco: Former Systems developer    Types: Snuff, Chew  Substance and Sexual Activity  . Alcohol use: Not Currently    Alcohol/week: 0.0 standard drinks    Comment: 08/22/2013 "drink ~ 1 beer and wine/yr"  . Drug use: No  . Sexual activity: Never  Other Topics Concern  . Not on file  Social History Narrative   From [place]. Relocated to this area from [facility].   Social Determinants of Health   Financial Resource Strain:   . Difficulty of Paying Living Expenses:   Food Insecurity:   . Worried About Charity fundraiser in the Last Year:   . Arboriculturist in the Last Year:   Transportation Needs:   . Lexicographer (Medical):   Marland Kitchen Lack of Transportation (Non-Medical):   Physical Activity:   . Days of Exercise per Week:   . Minutes of Exercise per Session:   Stress:   . Feeling of Stress :   Social Connections:   . Frequency of Communication with Friends and Family:   . Frequency of Social Gatherings with Friends and Family:   . Attends Religious Services:   . Active Member of Clubs or Organizations:   . Attends Archivist Meetings:   Marland Kitchen Marital Status:   Intimate Partner Violence:   . Fear of Current or Ex-Partner:   . Emotionally Abused:   Marland Kitchen Physically Abused:   . Sexually Abused:    Family History  Problem Relation Age of Onset  . Cancer Mother        uterine deceased age 17  . Hypertension Sister   . Arrhythmia Other        Atrial fibrillation  . Hypertension Maternal Grandmother     Objective: Office vital signs reviewed. BP 133/78   Pulse 73   Temp (!) 97.5 F (36.4 C)   Ht 5\' 11"  (1.803 m)   Wt 168 lb 3.2 oz (76.3 kg)   SpO2 97%   BMI 23.46 kg/m   Physical Examination:  General: Awake, alert, well nourished, No acute distress HEENT: Normal, sclera white, MMM Cardio: regular rate and rhythm, S1S2 heard, no murmurs appreciated Pulm: clear to auscultation bilaterally, no wheezes, rhonchi or rales; normal work of breathing on room air Extremities: warm, well perfused, No edema, cyanosis or clubbing; +2 pulses bilaterally Neuro: see MMSE below  MMSE - Mini Mental State Exam 01/05/2020 05/26/2019 10/04/2017  Orientation to time 4 5 2   Orientation to Place 4 5 4   Registration 3 3 2   Attention/ Calculation 4 4 5   Recall 0 0 3  Language- name 2 objects 2 2 2   Language- repeat 1 1 1   Language- follow 3 step command 3 3 3   Language- read & follow direction 1 1 1   Write a sentence 1 1 1   Copy design 1 1 1   Total score 24 26 25     Assessment/ Plan: 86 y.o. male   1. Vascular dementia without behavioral disturbance ([place]) Start Namenda titration  pack.  Increase dose per tolerance given moderate renal impairment.  Max daily dose 20 mg/day.  Follow-up in 1 month. - memantine (NAMENDA TITRATION PAK) tablet pack; 5 mg/day for =1 week; 5 mg twice daily for =1 week; 15  mg/day given in 5 mg and 10 mg separated doses for =1 week; then 10 mg twice daily  Dispense: 49 tablet; Refill: 12  2. Paroxysmal atrial fibrillation (HCC) Stable.  3. Chronic obstructive pulmonary disease, unspecified COPD type (HCC) Trial of Anoro.  Samples given.  Demonstration in office on how to use.  Follow-up as needed - umeclidinium-vilanterol (ANORO ELLIPTA) 62.5-25 MCG/INH AEPB; Inhale 1 puff into the lungs daily.  Dispense: 60 each; Refill: 12   No orders of the defined types were placed in this encounter.  No orders of the defined types were placed in this encounter.    Janora Norlander, DO [place] [phone]

## 2020-01-25 ENCOUNTER — Other Ambulatory Visit: Payer: Self-pay | Admitting: Family Medicine

## 2020-01-25 DIAGNOSIS — I10 Essential (primary) hypertension: Secondary | ICD-10-CM

## 2020-01-25 DIAGNOSIS — N183 Chronic kidney disease, stage 3 unspecified: Secondary | ICD-10-CM

## 2020-02-02 ENCOUNTER — Other Ambulatory Visit: Payer: Self-pay | Admitting: Family Medicine

## 2020-02-02 DIAGNOSIS — F0150 Vascular dementia without behavioral disturbance: Secondary | ICD-10-CM

## 2020-02-06 ENCOUNTER — Other Ambulatory Visit: Payer: Self-pay

## 2020-02-06 ENCOUNTER — Ambulatory Visit (INDEPENDENT_AMBULATORY_CARE_PROVIDER_SITE_OTHER): Payer: Medicare Other | Admitting: Family Medicine

## 2020-02-06 ENCOUNTER — Encounter: Payer: Self-pay | Admitting: Family Medicine

## 2020-02-06 VITALS — BP 134/87 | HR 87 | Temp 96.8°F | Ht 71.0 in | Wt 171.4 lb

## 2020-02-06 DIAGNOSIS — J449 Chronic obstructive pulmonary disease, unspecified: Secondary | ICD-10-CM | POA: Diagnosis not present

## 2020-02-06 DIAGNOSIS — N1832 Chronic kidney disease, stage 3b: Secondary | ICD-10-CM | POA: Diagnosis not present

## 2020-02-06 DIAGNOSIS — F0150 Vascular dementia without behavioral disturbance: Secondary | ICD-10-CM | POA: Diagnosis not present

## 2020-02-06 MED ORDER — MEMANTINE HCL 5 MG PO TABS: 5.0000 mg | ORAL_TABLET | Freq: Two times a day (BID) | ORAL | 3 refills | Status: DC

## 2020-02-06 NOTE — Progress Notes (Signed)
Subjective: CC: f/u memory PCP: Janora Norlander, DO Erik Mullins is a 86 y.o. male presenting to clinic today for:  1. Memory loss Patient was started on Namenda at last visit.  He has been tolerating medication without difficulty.  His wife has noted a slight increase in appetite but overall he seems to be tolerating the medicine.  No change in sleep patterns, mood.  2.  Shortness of breath He is tolerating the Anoro very well and feels that it is helping with his breathing.  He continues to use albuterol twice daily because this is what he is always done.  No cough, hemoptysis.  His wife is noticed a difference.   ROS: Per HPI  Allergies  Allergen Reactions  . Morphine Other (See Comments)    hallucinate   Past Medical History:  Diagnosis Date  . AAA (abdominal aortic aneurysm) ([place])    Remote ~1994   . Anemia   . Arthritis   . Atrial fibrillation [facility])    Diagnosed December 2014  . AVNRT (AV nodal re-entry tachycardia) ([place])    Possible  . Cataract    Bilateral  . Cirrhosis ([place])   . CKD (chronic kidney disease) stage 3, GFR 30-59 ml/min   . Essential hypertension, benign   . Gout   . History of GI bleed   . History of gout   . History of hepatitis   . History of stroke   . Hyperlipidemia   . PAD (peripheral artery disease) (HCC)    Stenting in lower extremities (no records)  . Prostate cancer ([place])    XRT; in remission  . Type 2 diabetes mellitus (HCC)     Current Outpatient Medications:  .  albuterol (VENTOLIN HFA) 108 (90 Base) MCG/ACT inhaler, Inhale 1-2 puffs into the lungs every 6 (six) hours as needed for wheezing or shortness of breath., Disp: 18 g, Rfl: 2 .  allopurinol (ZYLOPRIM) 100 MG tablet, TAKE 1 TABLET BY MOUTH EVERY DAY, Disp: 90 tablet, Rfl: 3 .  betamethasone valerate ointment (VALISONE) 0.1 %, Apply 1 application topically 2 (two) times daily., Disp: , Rfl:  .  Calcium Carbonate-Vitamin D (CALTRATE 600+D) 600-400 MG-UNIT per  tablet, Take 1 tablet by mouth daily.  , Disp: , Rfl:  .  docusate sodium (COLACE) 100 MG capsule, Take 100 mg by mouth daily., Disp: , Rfl:  .  lisinopril (ZESTRIL) 10 MG tablet, TAKE 1 TABLET BY MOUTH EVERY DAY, Disp: 90 tablet, Rfl: 0 .  metoprolol tartrate (LOPRESSOR) 25 MG tablet, TAKE 1/2 TABLET (12.5 MG TOTAL) BY MOUTH 2 (TWO) TIMES DAILY., Disp: 90 tablet, Rfl: 1 .  Multiple Vitamin (MULTIVITAMIN) tablet, Take 1 tablet by mouth daily., Disp: , Rfl:  .  umeclidinium-vilanterol (ANORO ELLIPTA) 62.5-25 MCG/INH AEPB, Inhale 1 puff into the lungs daily., Disp: 60 each, Rfl: 12 .  XARELTO 15 MG TABS tablet, TAKE 1 TABLET (15 MG TOTAL) BY MOUTH DAILY WITH SUPPER., Disp: 90 tablet, Rfl: 0 .  memantine (NAMENDA) 5 MG tablet, Take 1 tablet (5 mg total) by mouth at bedtime for 14 days, THEN 1 tablet (5 mg total) at bedtime for 16 days., Disp: 30 tablet, Rfl: 0 Social History   Socioeconomic History  . Marital status: Married    Spouse name: Not on file  . Number of children: Not on file  . Years of education: Not on file  . Highest education level: Not on file  Occupational History  . Not on file  Tobacco Use  . Smoking status: Current Every Day Smoker    Packs/day: 0.25    Years: 70.00    Pack years: 17.50    Types: Cigarettes    Start date: 09/11/1944  . Smokeless tobacco: Former Systems developer    Types: Snuff, Chew  Substance and Sexual Activity  . Alcohol use: Not Currently    Alcohol/week: 0.0 standard drinks    Comment: 08/22/2013 "drink ~ 1 beer and wine/yr"  . Drug use: No  . Sexual activity: Never  Other Topics Concern  . Not on file  Social History Narrative   From [place]. Relocated to this area from [facility].   Social Determinants of Health   Financial Resource Strain:   . Difficulty of Paying Living Expenses:   Food Insecurity:   . Worried About Charity fundraiser in the Last Year:   . Arboriculturist in the Last Year:   Transportation Needs:   . Lexicographer (Medical):   Marland Kitchen Lack of Transportation (Non-Medical):   Physical Activity:   . Days of Exercise per Week:   . Minutes of Exercise per Session:   Stress:   . Feeling of Stress :   Social Connections:   . Frequency of Communication with Friends and Family:   . Frequency of Social Gatherings with Friends and Family:   . Attends Religious Services:   . Active Member of Clubs or Organizations:   . Attends Archivist Meetings:   Marland Kitchen Marital Status:   Intimate Partner Violence:   . Fear of Current or Ex-Partner:   . Emotionally Abused:   Marland Kitchen Physically Abused:   . Sexually Abused:    Family History  Problem Relation Age of Onset  . Cancer Mother        uterine deceased age 65  . Hypertension Sister   . Arrhythmia Other        Atrial fibrillation  . Hypertension Maternal Grandmother     Objective: Office vital signs reviewed. BP 134/87   Pulse 87   Temp (!) 96.8 F (36 C)   Ht 5' 11" (1.803 m)   Wt 171 lb 6.4 oz (77.7 kg)   SpO2 97%   BMI 23.91 kg/m   Physical Examination:  General: Awake, alert, well nourished, No acute distress HEENT: Normal, sclera white, MMM Cardio: regular rate and rhythm, S1S2 heard, no murmurs appreciated Pulm: Globally decreased breath sounds but no wheezes.  Air movement fair.  Normal work of breathing on room air Extremities: warm, well perfused, No edema, cyanosis or clubbing; +2 pulses bilaterally Neuro: Hard of hearing but conversive and pleasant  Depression screen [facility] 2/9 02/06/2020 01/05/2020 10/19/2019  Decreased Interest 0 0 0  Down, Depressed, Hopeless 0 0 0  PHQ - 2 Score 0 0 0  Altered sleeping - - 0  Tired, decreased energy - - 0  Change in appetite - - 0  Feeling bad or failure about yourself  - - 0  Trouble concentrating - - 0  Moving slowly or fidgety/restless - - 0  Suicidal thoughts - - 0  PHQ-9 Score - - 0  Some recent data might be hidden     Assessment/ Plan: 86 y.o. male   1. Vascular dementia  without behavioral disturbance (HCC) Stable.  Continue Namenda.  Plan to recheck in 5 months. - memantine (NAMENDA) 5 MG tablet; Take 1 tablet (5 mg total) by mouth 2 (two) times daily.  Dispense: 180 tablet; Refill:  3  2. Stage 3b chronic kidney disease Plan to check renal function with his upcoming labs for urology - CMP14+EGFR; Future  3. Chronic obstructive pulmonary disease, unspecified COPD type ([place]) Doing well with Anoro.  Continue current regimen.  Advised to drop the albuterol unless needed.   No orders of the defined types were placed in this encounter.  No orders of the defined types were placed in this encounter.    Janora Norlander, DO [place] [phone]

## 2020-02-06 NOTE — Patient Instructions (Addendum)
Use anoro daily. Use albuterol ONLY if needed  Namenda sent.  We will recheck a memory test in 5 months.

## 2020-02-13 ENCOUNTER — Other Ambulatory Visit: Payer: Self-pay | Admitting: Family Medicine

## 2020-02-13 ENCOUNTER — Telehealth: Payer: Self-pay | Admitting: Family Medicine

## 2020-02-13 ENCOUNTER — Other Ambulatory Visit: Payer: Self-pay | Admitting: Cardiology

## 2020-02-13 DIAGNOSIS — I10 Essential (primary) hypertension: Secondary | ICD-10-CM

## 2020-02-13 DIAGNOSIS — F0150 Vascular dementia without behavioral disturbance: Secondary | ICD-10-CM

## 2020-02-13 DIAGNOSIS — N183 Chronic kidney disease, stage 3 unspecified: Secondary | ICD-10-CM

## 2020-02-13 MED ORDER — MEMANTINE HCL 5 MG PO TABS: 5.0000 mg | ORAL_TABLET | Freq: Two times a day (BID) | ORAL | 3 refills | Status: DC

## 2020-02-13 NOTE — Telephone Encounter (Signed)
Resent VM box not set up

## 2020-02-13 NOTE — Telephone Encounter (Signed)
Need to resend Rx for Memantine, 5mg . [facility] in [place] did not receive and we do not have confirmation receipt from them.

## 2020-03-01 ENCOUNTER — Ambulatory Visit: Payer: Medicare Other | Admitting: Urology

## 2020-04-01 ENCOUNTER — Other Ambulatory Visit: Payer: Medicare Other

## 2020-04-02 ENCOUNTER — Other Ambulatory Visit: Payer: Self-pay

## 2020-04-02 ENCOUNTER — Other Ambulatory Visit: Payer: Medicare Other

## 2020-04-02 DIAGNOSIS — N1832 Chronic kidney disease, stage 3b: Secondary | ICD-10-CM

## 2020-04-02 DIAGNOSIS — C61 Malignant neoplasm of prostate: Secondary | ICD-10-CM | POA: Diagnosis not present

## 2020-04-02 NOTE — Addendum Note (Signed)
Addended by: Earlene Plater on: 04/02/2020 08:52 AM   Modules accepted: Orders

## 2020-04-03 LAB — CMP14+EGFR
ALT: 10 IU/L (ref 0–44)
AST: 17 IU/L (ref 0–40)
Albumin/Globulin Ratio: 1.8 (ref 1.2–2.2)
Albumin: 4.3 g/dL (ref 3.6–4.6)
Alkaline Phosphatase: 102 IU/L (ref 48–121)
BUN/Creatinine Ratio: 17 (ref 10–24)
BUN: 34 mg/dL — ABNORMAL HIGH (ref 8–27)
Bilirubin Total: 0.3 mg/dL (ref 0.0–1.2)
CO2: 24 mmol/L (ref 20–29)
Calcium: 9.6 mg/dL (ref 8.6–10.2)
Chloride: 106 mmol/L (ref 96–106)
Creatinine, Ser: 1.95 mg/dL — ABNORMAL HIGH (ref 0.76–1.27)
GFR calc Af Amer: 35 mL/min/{1.73_m2} — ABNORMAL LOW (ref >59)
GFR calc non Af Amer: 30 mL/min/{1.73_m2} — ABNORMAL LOW (ref >59)
Globulin, Total: 2.4 g/dL (ref 1.5–4.5)
Glucose: 101 mg/dL — ABNORMAL HIGH (ref 65–99)
Potassium: 4.9 mmol/L (ref 3.5–5.2)
Sodium: 145 mmol/L — ABNORMAL HIGH (ref 134–144)
Total Protein: 6.7 g/dL (ref 6.0–8.5)

## 2020-04-03 LAB — TESTOSTERONE: Testosterone: <3 ng/dL — ABNORMAL LOW (ref 264–916)

## 2020-04-03 LAB — PSA: Prostate Specific Ag, Serum: 1.5 ng/mL (ref 0.0–4.0)

## 2020-04-04 NOTE — Progress Notes (Signed)
Subjective:  1. Prostate cancer ([place])   2. Rising PSA following treatment for malignant neoplasm of prostate      [name] returns today in f/u for his history of prostate cancer that was diagnosed in 4/10 and he completed radiation therapy on 08/16/09.  He had gleason 9 disease intiallly.  He had a PSA recurrence and was started on Lupron in 2017 after his PSA rose to 9.9 in 6/17 from 3.6 in 12/16.  A bone scan and CT in 7/17 showed no mets.   His PSA nadired at 0.2 on Lupron in 9/18 but has been rising and was 0.4 in 3/20, 0.8 in 12/20 and 1.5 prior to this visit.  His testosterone has remained castrate.  His last Lupron injection was on 09/01/19.  He has not had staging in the last 2 years.   He is doing well without other associated signs or symptoms.  He is voiding well with an IPSS of 3-4 with nocturia x 2-3.  He drinks a lot of coffee prior to bed.  He has no bone pain or weight loss.   He has no hematuria.      IPSS    Row Name 04/05/20 1400         International Prostate Symptom Score   How often have you had the sensation of not emptying your bladder? Not at All     How often have you had to urinate less than every two hours? Less than 1 in 5 times     How often have you found you stopped and started again several times when you urinated? Not at All     How often have you found it difficult to postpone urination? Not at All     How often have you had a weak urinary stream? Not at All     How often have you had to strain to start urination? Not at All     How many times did you typically get up at night to urinate? 3 Times     Total IPSS Score 4       Quality of Life due to urinary symptoms   If you were to spend the rest of your life with your urinary condition just the way it is now how would you feel about that? Mixed             ROS:  ROS  Allergies  Allergen Reactions  . Morphine Other (See Comments)    hallucinate    Outpatient Encounter Medications as of  04/05/2020  Medication Sig  . albuterol (VENTOLIN HFA) 108 (90 Base) MCG/ACT inhaler Inhale 1-2 puffs into the lungs every 6 (six) hours as needed for wheezing or shortness of breath.  . allopurinol (ZYLOPRIM) 100 MG tablet TAKE 1 TABLET BY MOUTH EVERY DAY  . betamethasone valerate ointment (VALISONE) 0.1 % Apply 1 application topically 2 (two) times daily.  . Calcium Carbonate-Vitamin D (CALTRATE 600+D) 600-400 MG-UNIT per tablet Take 1 tablet by mouth daily.    Marland Kitchen docusate sodium (COLACE) 100 MG capsule Take 100 mg by mouth daily.  Marland Kitchen lisinopril (ZESTRIL) 10 MG tablet TAKE 1 TABLET BY MOUTH EVERY DAY  . memantine (NAMENDA) 5 MG tablet Take 1 tablet (5 mg total) by mouth 2 (two) times daily.  . metoprolol tartrate (LOPRESSOR) 25 MG tablet TAKE 1/2 TABLET (12.5 MG TOTAL) BY MOUTH 2 (TWO) TIMES DAILY.  . Multiple Vitamin (MULTIVITAMIN) tablet Take 1 tablet by mouth daily.  Marland Kitchen  umeclidinium-vilanterol (ANORO ELLIPTA) 62.5-25 MCG/INH AEPB Inhale 1 puff into the lungs daily.  Alveda Reasons 15 MG TABS tablet TAKE 1 TABLET (15 MG TOTAL) BY MOUTH DAILY WITH SUPPER.   No facility-administered encounter medications on file as of 04/05/2020.    Past Medical History:  Diagnosis Date  . AAA (abdominal aortic aneurysm) ([place])    Remote ~1994   . Anemia   . Arthritis   . Atrial fibrillation [facility])    Diagnosed December 2014  . AVNRT (AV nodal re-entry tachycardia) ([place])    Possible  . Cataract    Bilateral  . Cirrhosis ([place])   . CKD (chronic kidney disease) stage 3, GFR 30-59 ml/min   . Essential hypertension, benign   . Gout   . History of GI bleed   . History of gout   . History of hepatitis   . History of stroke   . Hyperlipidemia   . PAD (peripheral artery disease) ([facility])    Stenting in lower extremities (no records)  . Prostate cancer ([place])    XRT; in remission  . Type 2 diabetes mellitus ([place])     Past Surgical History:  Procedure Laterality Date  . ABDOMINAL AORTIC ANEURYSM REPAIR  1980's    in [place]  . APPENDECTOMY    . CAROTID ENDARTERECTOMY Right ~ 1999  . CATARACT EXTRACTION W/ INTRAOCULAR LENS  IMPLANT, BILATERAL Bilateral 2014  . CHOLECYSTECTOMY    . COLON SURGERY    . PARTIAL COLECTOMY     "I was bleeding to death inside; took out 2/3 of my colon" (08/22/2013)    Social History   Socioeconomic History  . Marital status: Married    Spouse name: Not on file  . Number of children: Not on file  . Years of education: Not on file  . Highest education level: Not on file  Occupational History  . Not on file  Tobacco Use  . Smoking status: Current Every Day Smoker    Packs/day: 0.25    Years: 70.00    Pack years: 17.50    Types: Cigarettes    Start date: 09/11/1944  . Smokeless tobacco: Former Systems developer    Types: Snuff, Chew  Vaping Use  . Vaping Use: Never used  Substance and Sexual Activity  . Alcohol use: Not Currently    Alcohol/week: 0.0 standard drinks    Comment: 08/22/2013 "drink ~ 1 beer and wine/yr"  . Drug use: No  . Sexual activity: Never  Other Topics Concern  . Not on file  Social History Narrative   From [place]. Relocated to this area from [facility].   Social Determinants of Health   Financial Resource Strain:   . Difficulty of Paying Living Expenses:   Food Insecurity:   . Worried About Charity fundraiser in the Last Year:   . Arboriculturist in the Last Year:   Transportation Needs:   . Film/video editor (Medical):   Marland Kitchen Lack of Transportation (Non-Medical):   Physical Activity:   . Days of Exercise per Week:   . Minutes of Exercise per Session:   Stress:   . Feeling of Stress :   Social Connections:   . Frequency of Communication with Friends and Family:   . Frequency of Social Gatherings with Friends and Family:   . Attends Religious Services:   . Active Member of Clubs or Organizations:   . Attends Archivist Meetings:   Marland Kitchen Marital Status:   Intimate  Partner Violence:   . Fear of Current or  Ex-Partner:   . Emotionally Abused:   Marland Kitchen Physically Abused:   . Sexually Abused:     Family History  Problem Relation Age of Onset  . Cancer Mother        uterine deceased age 55  . Hypertension Sister   . Arrhythmia Other        Atrial fibrillation  . Hypertension Maternal Grandmother        Objective: Vitals:   04/05/20 1343  BP: (!) 157/80  Pulse: 55  Temp: 97.7 F (36.5 C)     Physical Exam Vitals reviewed.  Constitutional:      Appearance: Normal appearance.  Genitourinary:    Comments: Deferred rectal exam. Lymphadenopathy:     Cervical:     Right cervical: No superficial cervical adenopathy.    Left cervical: No superficial cervical adenopathy.     Upper Body:     Right upper body: No supraclavicular or axillary adenopathy.     Left upper body: No supraclavicular or axillary adenopathy.  Neurological:     Mental Status: He is alert.     Lab Results:  No results for input(s): HGB, HCT, PLT in the last 72 hours.  Invalid input(s):  WBC BMET No results for input(s): NA, K, CL, CO2, GLUCOSE, BUN, CREATININE, CALCIUM in the last 72 hours. PSA Is up to 0.8 from 0.4 in 3/20.    Results for orders placed or performed in visit on 04/05/20 (from the past 24 hour(s))  POCT urinalysis dipstick     Status: None   Collection Time: 04/05/20  1:58 PM  Result Value Ref Range   Color, UA yellow    Clarity, UA     Glucose, UA Negative Negative   Bilirubin, UA neg    Ketones, UA neg    Spec Grav, UA 1.020 1.010 - 1.025   Blood, UA neg    pH, UA 6.0 5.0 - 8.0   Protein, UA Negative Negative   Urobilinogen, UA 0.2 0.2 or 1.0 E.U./dL   Nitrite, UA neg    Leukocytes, UA Negative Negative   Appearance clear    Odor       Studies/Results: No results found.    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  I discussed standard staging with bone scan and CT vs Axumin PET which may allow [place] to find  and treat oligometastatic disease.   I will order the PET and he will return with the results.   He was given Lupron today.   Rising PSA following treatment for malignant neoplasm of prostate His PSADT is about 6 months    Orders Placed This Encounter  Procedures  . NM PET (AXUMIN) SKULL BASE TO MID THIGH    Standing Status:   Future    Standing Expiration Date:   05/06/2020    Order Specific Question:   If indicated for the ordered procedure, I authorize the administration of a radiopharmaceutical per Radiology protocol    Answer:   Yes    Order Specific Question:   Preferred imaging location?    Answer:   [facility]    Order Specific Question:   Radiology Contrast Protocol - do NOT remove file path    Answer:   [email or web address]  . POCT urinalysis dipstick      Return for with PET scan results.     CC: Janora Norlander, DO  Irine Seal 04/05/2020

## 2020-04-05 ENCOUNTER — Other Ambulatory Visit: Payer: Self-pay

## 2020-04-05 ENCOUNTER — Ambulatory Visit (INDEPENDENT_AMBULATORY_CARE_PROVIDER_SITE_OTHER): Payer: Medicare Other | Admitting: Urology

## 2020-04-05 ENCOUNTER — Encounter: Payer: Self-pay | Admitting: Urology

## 2020-04-05 VITALS — BP 157/80 | HR 55 | Temp 97.7°F | Ht 72.0 in | Wt 172.0 lb

## 2020-04-05 DIAGNOSIS — R9721 Rising PSA following treatment for malignant neoplasm of prostate: Secondary | ICD-10-CM

## 2020-04-05 DIAGNOSIS — C61 Malignant neoplasm of prostate: Secondary | ICD-10-CM | POA: Diagnosis not present

## 2020-04-05 LAB — POCT URINALYSIS DIPSTICK
Bilirubin, UA: NEGATIVE
Blood, UA: NEGATIVE
Glucose, UA: NEGATIVE
Ketones, UA: NEGATIVE
Leukocytes, UA: NEGATIVE
Nitrite, UA: NEGATIVE
Protein, UA: NEGATIVE
Spec Grav, UA: 1.02 (ref 1.010–1.025)
Urobilinogen, UA: 0.2 E.U./dL
pH, UA: 6 (ref 5.0–8.0)

## 2020-04-05 MED ORDER — LEUPROLIDE ACETATE (6 MONTH) 45 MG IM KIT
45.0000 mg | PACK | Freq: Once | INTRAMUSCULAR | Status: AC
  Administered 2020-04-05: 45 mg via INTRAMUSCULAR

## 2020-04-05 NOTE — Progress Notes (Signed)
Urological Symptom Review  Patient is experiencing the following symptoms: Get up at night to urinate   Review of Systems  Gastrointestinal (upper)  : Negative for upper GI symptoms  Gastrointestinal (lower) : Negative for lower GI symptoms  Constitutional : Negative for symptoms  Skin: Negative for skin symptoms  Eyes: Negative for eye symptoms  Ear/Nose/Throat : Negative for Ear/Nose/Throat symptoms  Hematologic/Lymphatic: Easy bruising  Cardiovascular : Negative for cardiovascular symptoms  Respiratory : Negative for respiratory symptoms  Endocrine: Negative for endocrine symptoms  Musculoskeletal: Back pain  Neurological: Negative for neurological symptoms  Psychologic: Negative for psychiatric symptoms

## 2020-04-05 NOTE — Assessment & Plan Note (Signed)
His PSADT is about 6 months

## 2020-04-05 NOTE — Progress Notes (Signed)
Lupron IM Injection   Due to Prostate Cancer patient is present today for a Lupron Injection.  Medication: Lupron 6 month Dose: 45 mg  Location: right upper outer buttocks Lot: [id number] Exp: 10/06/22  Patient tolerated well, no complications were noted  Performed by: Antionette Char, Johnnae Impastato,LPN

## 2020-04-05 NOTE — Assessment & Plan Note (Signed)
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  I discussed standard staging with bone scan and CT vs Axumin PET which may allow [place] to find and treat oligometastatic disease.   I will order the PET and he will return with the results.   He was given Lupron today.

## 2020-04-18 ENCOUNTER — Encounter: Payer: Self-pay | Admitting: Cardiology

## 2020-04-18 ENCOUNTER — Ambulatory Visit (INDEPENDENT_AMBULATORY_CARE_PROVIDER_SITE_OTHER): Payer: Medicare Other | Admitting: Cardiology

## 2020-04-18 VITALS — BP 142/84 | HR 88 | Ht 71.0 in | Wt 176.6 lb

## 2020-04-18 DIAGNOSIS — I10 Essential (primary) hypertension: Secondary | ICD-10-CM | POA: Diagnosis not present

## 2020-04-18 DIAGNOSIS — I4819 Other persistent atrial fibrillation: Secondary | ICD-10-CM | POA: Diagnosis not present

## 2020-04-18 DIAGNOSIS — N1832 Chronic kidney disease, stage 3b: Secondary | ICD-10-CM | POA: Diagnosis not present

## 2020-04-18 NOTE — Patient Instructions (Addendum)

## 2020-04-18 NOTE — Progress Notes (Signed)
Cardiology Office Note  Date: 04/18/2020   ID: Erik Mullins, DOB 11-27-1930, MRN [id number]  PCP:  Janora Norlander, DO  Cardiologist:  Rozann Lesches, MD Electrophysiologist:  None   Chief Complaint  Patient presents with  . Cardiac follow-up    History of Present Illness: Erik Mullins is an 86 y.o. male last seen in February.  He is here today for a routine visit.  He does not report any progressive sense of palpitations, has had some trouble with chest congestion, possibly allergy symptoms.  No orthopnea or PND.  I reviewed his recent lab work as outlined below.  He does not report any bleeding problems on renally adjusted Xarelto and otherwise continues on Lopressor with adequate heart rate control of atrial fibrillation.  Past Medical History:  Diagnosis Date  . AAA (abdominal aortic aneurysm) ([place])    Remote ~1994   . Anemia   . Arthritis   . Atrial fibrillation [facility])    Diagnosed December 2014  . AVNRT (AV nodal re-entry tachycardia) ([place])    Possible  . Cataract    Bilateral  . Cirrhosis ([place])   . CKD (chronic kidney disease) stage 3, GFR 30-59 ml/min   . Essential hypertension   . Gout   . History of GI bleed   . History of gout   . History of hepatitis   . History of stroke   . Hyperlipidemia   . PAD (peripheral artery disease) ([facility])    Stenting in lower extremities (no records)  . Prostate cancer ([place])    XRT; in remission  . Type 2 diabetes mellitus ([place])     Past Surgical History:  Procedure Laterality Date  . ABDOMINAL AORTIC ANEURYSM REPAIR  1980's   in [place]  . APPENDECTOMY    . CAROTID ENDARTERECTOMY Right ~ 1999  . CATARACT EXTRACTION W/ INTRAOCULAR LENS  IMPLANT, BILATERAL Bilateral 2014  . CHOLECYSTECTOMY    . COLON SURGERY    . PARTIAL COLECTOMY     "I was bleeding to death inside; took out 2/3 of my colon" (08/22/2013)    Current Outpatient Medications  Medication Sig Dispense Refill  . albuterol (VENTOLIN HFA) 108  (90 Base) MCG/ACT inhaler Inhale 1-2 puffs into the lungs every 6 (six) hours as needed for wheezing or shortness of breath. 18 g 2  . allopurinol (ZYLOPRIM) 100 MG tablet TAKE 1 TABLET BY MOUTH EVERY DAY 90 tablet 3  . betamethasone valerate ointment (VALISONE) 0.1 % Apply 1 application topically 2 (two) times daily.    . Calcium Carbonate-Vitamin D (CALTRATE 600+D) 600-400 MG-UNIT per tablet Take 1 tablet by mouth daily.      Marland Kitchen docusate sodium (COLACE) 100 MG capsule Take 100 mg by mouth daily.    Marland Kitchen lisinopril (ZESTRIL) 10 MG tablet TAKE 1 TABLET BY MOUTH EVERY DAY 90 tablet 0  . memantine (NAMENDA) 5 MG tablet Take 1 tablet (5 mg total) by mouth 2 (two) times daily. 180 tablet 3  . metoprolol tartrate (LOPRESSOR) 25 MG tablet TAKE 1/2 TABLET (12.5 MG TOTAL) BY MOUTH 2 (TWO) TIMES DAILY. 90 tablet 1  . Multiple Vitamin (MULTIVITAMIN) tablet Take 1 tablet by mouth daily.    Marland Kitchen umeclidinium-vilanterol (ANORO ELLIPTA) 62.5-25 MCG/INH AEPB Inhale 1 puff into the lungs daily. 60 each 12  . XARELTO 15 MG TABS tablet TAKE 1 TABLET (15 MG TOTAL) BY MOUTH DAILY WITH SUPPER. 90 tablet 0   No current facility-administered medications for this visit.  Allergies:  Morphine   ROS:  Memory loss.  Physical Exam: VS:  BP (!) 142/84   Pulse 88   Ht 5\' 11"  (1.803 m)   Wt 176 lb 9.6 oz (80.1 kg)   SpO2 97%   BMI 24.63 kg/m , BMI Body mass index is 24.63 kg/m.  Wt Readings from Last 3 Encounters:  04/18/20 176 lb 9.6 oz (80.1 kg)  04/05/20 172 lb (78 kg)  02/06/20 171 lb 6.4 oz (77.7 kg)    General:  Elderly male, appears comfortable at rest. HEENT: Conjunctiva and lids normal, wearing a mask. Neck: Supple, no elevated JVP or carotid bruits, no thyromegaly. Lungs: Clear to auscultation, nonlabored breathing at rest. Cardiac: Irregularly irregular, no S3 or significant systolic murmur. Extremities: No pitting edema, distal pulses 2+.  ECG:  An ECG dated 10/16/2019 was personally reviewed today and  demonstrated:  Rate controlled atrial fibrillation with nonspecific ST changes.  Recent Labwork: 08/29/2019: Hemoglobin 16.5; Platelets 177 04/02/2020: ALT 10; AST 17; BUN 34; Creatinine, Ser 1.95; Potassium 4.9; Sodium 145     Component Value Date/Time   CHOL 194 11/29/2017 0905   CHOL 122 02/07/2013 0903   TRIG 187 (H) 11/29/2017 0905   TRIG 168 (H) 10/24/2013 0909   TRIG 140 02/07/2013 0903   HDL 58 11/29/2017 0905   HDL 44 10/24/2013 0909   HDL 41 02/07/2013 0903   CHOLHDL 3.3 11/29/2017 0905   LDLCALC 99 11/29/2017 0905   LDLCALC 69 10/24/2013 0909   LDLCALC 53 02/07/2013 0903    Other Studies Reviewed Today:  Echocardiogram 08/23/2013: Study Conclusions  Left ventricle: The cavity size was normal. Wall thickness was normal. Systolic function was normal. The estimated ejection fraction was in the range of 60% to 65%.  Assessment and Plan:  1.  Permanent atrial fibrillation, plan to continue heart rate control strategy and anticoagulation.  He is on Lopressor and renally adjusted Xarelto without bleeding problems.  I reviewed his recent lab work from July.  2.  CKD stage IIIb, creatinine 1.95.  3.  Essential hypertension, systolic is in the 469G today.  Continue with current medications including lisinopril and Lopressor.  Medication Adjustments/Labs and Tests Ordered: Current medicines are reviewed at length with the patient today.  Concerns regarding medicines are outlined above.   Tests Ordered: No orders of the defined types were placed in this encounter.   Medication Changes: No orders of the defined types were placed in this encounter.   Disposition:  Follow up 6 months in the [place] office.  Signed, Satira Sark, MD, [facility] 04/18/2020 2:29 PM    [facility] [place], [place], [place] [address] Phone: [phone]; Fax: [phone]

## 2020-04-19 DIAGNOSIS — H40033 Anatomical narrow angle, bilateral: Secondary | ICD-10-CM | POA: Diagnosis not present

## 2020-04-19 DIAGNOSIS — H04123 Dry eye syndrome of bilateral lacrimal glands: Secondary | ICD-10-CM | POA: Diagnosis not present

## 2020-05-01 NOTE — Addendum Note (Signed)
Addended by: Dorisann Frames on: 05/01/2020 11:02 AM   Modules accepted: Orders

## 2020-05-10 ENCOUNTER — Telehealth: Payer: Self-pay

## 2020-05-10 NOTE — Telephone Encounter (Signed)
Pts wife called and said pt did not remember Dr. Jeffie Pollock explaining at last ov that he was ordering a PET scan. He wanted to know what it was for also. Looked up last note in chart read and explained that he had discussed with pt. Wife expressed understanding.

## 2020-05-14 ENCOUNTER — Other Ambulatory Visit: Payer: Self-pay

## 2020-05-14 ENCOUNTER — Ambulatory Visit (HOSPITAL_COMMUNITY)
Admission: RE | Admit: 2020-05-14 | Discharge: 2020-05-14 | Disposition: A | Discharge status: Home / Self Care | Payer: Medicare Other | Source: Ambulatory Visit | Attending: Urology | Admitting: Urology

## 2020-05-14 DIAGNOSIS — C61 Malignant neoplasm of prostate: Secondary | ICD-10-CM | POA: Insufficient documentation

## 2020-05-14 DIAGNOSIS — R9721 Rising PSA following treatment for malignant neoplasm of prostate: Secondary | ICD-10-CM

## 2020-05-14 DIAGNOSIS — R911 Solitary pulmonary nodule: Secondary | ICD-10-CM | POA: Diagnosis not present

## 2020-05-14 MED ORDER — AXUMIN (FLUCICLOVINE F 18) INJECTION
9.7000 | Freq: Once | INTRAVENOUS | Status: AC | PRN
  Administered 2020-05-14: 9.7 via INTRAVENOUS

## 2020-05-19 ENCOUNTER — Other Ambulatory Visit: Payer: Self-pay | Admitting: Family Medicine

## 2020-05-19 DIAGNOSIS — N183 Chronic kidney disease, stage 3 unspecified: Secondary | ICD-10-CM

## 2020-05-19 DIAGNOSIS — I10 Essential (primary) hypertension: Secondary | ICD-10-CM

## 2020-05-22 ENCOUNTER — Telehealth: Payer: Self-pay

## 2020-05-22 NOTE — Telephone Encounter (Signed)
-----   Message from Irine Seal, MD sent at 05/22/2020  8:15 AM EDT ----- Tried to call without success.   There is a lesion in the lung that up takes the tracer and it could be prostate or primary to the lung.  An isolated prostate cancer metastasis to the lung would be unsual.  He was supposed to have a f/u appointment to discuss the results but I don't see that that has been set up.   Could we get them in to review these findings please.    ----- Message ----- From: Dorisann Frames, RN Sent: 05/21/2020   8:59 AM EDT To: Irine Seal, MD  Wife is calling about bone scan results.

## 2020-05-22 NOTE — Telephone Encounter (Signed)
Wife called and notified of results. appt scheduled.

## 2020-05-23 NOTE — Telephone Encounter (Signed)
Pt made aware

## 2020-05-23 NOTE — Progress Notes (Signed)
Subjective:  1. Prostate cancer ([place])   2. Rising PSA following treatment for malignant neoplasm of prostate   3. Nodule of upper lobe of left lung      Erik Mullins returns today in f/u from an [place] PET done for his history of prostate cancer that was diagnosed in 4/10 and he completed radiation therapy on 08/16/09.  He had gleason 9 disease intiallly.  He had a PSA recurrence and was started on Lupron in 2017 after his PSA rose to 9.9 in 6/17 from 3.6 in 12/16.  A bone scan and CT in 7/17 showed no mets.   His PSA nadired at 0.2 on Lupron in 9/18 but has been rising and was 0.4 in 3/20, 0.8 in 12/20 and 1.5 on 04/02/20.  His testosterone has remained castrate.  His last Lupron injection was on 04/05/20.   He is doing well without other associated signs or symptoms.  He is voiding well with an IPSS of 3-4 with nocturia x 2-3.  He drinks a lot of coffee prior to bed.  He has no bone pain or weight loss.   He has no hematuria.     The PET scan showed a 1cm nodule in the left upper lobe that was hypermetabolic and could be prostatic or a bronchogenic carcinoma.  Tissue sampling was recommended.  There was some diffuse uptake in the prostate as well.       ROS:  ROS  Allergies  Allergen Reactions  . Morphine Other (See Comments)    hallucinate    Outpatient Encounter Medications as of 05/24/2020  Medication Sig  . albuterol (VENTOLIN HFA) 108 (90 Base) MCG/ACT inhaler Inhale 1-2 puffs into the lungs every 6 (six) hours as needed for wheezing or shortness of breath.  . allopurinol (ZYLOPRIM) 100 MG tablet TAKE 1 TABLET BY MOUTH EVERY DAY  . betamethasone valerate ointment (VALISONE) 0.1 % Apply 1 application topically 2 (two) times daily.  . Calcium Carbonate-Vitamin D (CALTRATE 600+D) 600-400 MG-UNIT per tablet Take 1 tablet by mouth daily.    Marland Kitchen docusate sodium (COLACE) 100 MG capsule Take 100 mg by mouth daily.  Marland Kitchen lisinopril (ZESTRIL) 10 MG tablet TAKE 1 TABLET BY MOUTH EVERY DAY  .  memantine (NAMENDA) 5 MG tablet Take 1 tablet (5 mg total) by mouth 2 (two) times daily.  . metoprolol tartrate (LOPRESSOR) 25 MG tablet TAKE 1/2 TABLET (12.5 MG TOTAL) BY MOUTH 2 (TWO) TIMES DAILY.  . Multiple Vitamin (MULTIVITAMIN) tablet Take 1 tablet by mouth daily.  Marland Kitchen umeclidinium-vilanterol (ANORO ELLIPTA) 62.5-25 MCG/INH AEPB Inhale 1 puff into the lungs daily.  Alveda Reasons 15 MG TABS tablet TAKE 1 TABLET (15 MG TOTAL) BY MOUTH DAILY WITH SUPPER.   No facility-administered encounter medications on file as of 05/24/2020.    Past Medical History:  Diagnosis Date  . AAA (abdominal aortic aneurysm) ([place])    Remote ~1994   . Anemia   . Arthritis   . Atrial fibrillation [facility])    Diagnosed December 2014  . AVNRT (AV nodal re-entry tachycardia) ([place])    Possible  . Cataract    Bilateral  . Cirrhosis ([place])   . CKD (chronic kidney disease) stage 3, GFR 30-59 ml/min   . Essential hypertension   . Gout   . History of GI bleed   . History of gout   . History of hepatitis   . History of stroke   . Hyperlipidemia   . PAD (peripheral artery disease) ([place])  Stenting in lower extremities (no records)  . Prostate cancer ([place])    XRT; in remission  . Type 2 diabetes mellitus ([place])     Past Surgical History:  Procedure Laterality Date  . ABDOMINAL AORTIC ANEURYSM REPAIR  1980's   in [place]  . APPENDECTOMY    . CAROTID ENDARTERECTOMY Right ~ 1999  . CATARACT EXTRACTION W/ INTRAOCULAR LENS  IMPLANT, BILATERAL Bilateral 2014  . CHOLECYSTECTOMY    . COLON SURGERY    . PARTIAL COLECTOMY     "I was bleeding to death inside; took out 2/3 of my colon" (08/22/2013)    Social History   Socioeconomic History  . Marital status: Married    Spouse name: Not on file  . Number of children: Not on file  . Years of education: Not on file  . Highest education level: Not on file  Occupational History  . Not on file  Tobacco Use  . Smoking status: Former Smoker    Packs/day: 0.25     Years: 70.00    Pack years: 17.50    Types: Cigarettes    Start date: 09/11/1944    Quit date: 02/17/2020    Years since quitting: 0.2  . Smokeless tobacco: Former Systems developer    Types: Snuff, Chew  Vaping Use  . Vaping Use: Never used  Substance and Sexual Activity  . Alcohol use: Not Currently    Alcohol/week: 0.0 standard drinks    Comment: 08/22/2013 "drink ~ 1 beer and wine/yr"  . Drug use: No  . Sexual activity: Never  Other Topics Concern  . Not on file  Social History Narrative   From [place]. Relocated to this area from [place].   Social Determinants of Health   Financial Resource Strain:   . Difficulty of Paying Living Expenses: Not on file  Food Insecurity:   . Worried About Charity fundraiser in the Last Year: Not on file  . Ran Out of Food in the Last Year: Not on file  Transportation Needs:   . Lack of Transportation (Medical): Not on file  . Lack of Transportation (Non-Medical): Not on file  Physical Activity:   . Days of Exercise per Week: Not on file  . Minutes of Exercise per Session: Not on file  Stress:   . Feeling of Stress : Not on file  Social Connections:   . Frequency of Communication with Friends and Family: Not on file  . Frequency of Social Gatherings with Friends and Family: Not on file  . Attends Religious Services: Not on file  . Active Member of Clubs or Organizations: Not on file  . Attends Archivist Meetings: Not on file  . Marital Status: Not on file  Intimate Partner Violence:   . Fear of Current or Ex-Partner: Not on file  . Emotionally Abused: Not on file  . Physically Abused: Not on file  . Sexually Abused: Not on file    Family History  Problem Relation Age of Onset  . Cancer Mother        uterine deceased age 27  . Hypertension Sister   . Arrhythmia Other        Atrial fibrillation  . Hypertension Maternal Grandmother        Objective: Vitals:   05/24/20 1545  BP: (!) 183/78  Pulse: 93  Temp:  98 F (36.7 C)     Physical Exam Vitals reviewed.  Constitutional:      Appearance: Normal appearance.  Genitourinary:    Comments: Deferred rectal exam. Lymphadenopathy:     Cervical:     Right cervical: No superficial cervical adenopathy.    Left cervical: No superficial cervical adenopathy.     Upper Body:     Right upper body: No supraclavicular or axillary adenopathy.     Left upper body: No supraclavicular or axillary adenopathy.  Neurological:     Mental Status: He is alert.     Lab Results:  No results for input(s): HGB, HCT, PLT in the last 72 hours.  Invalid input(s):  WBC BMET No results for input(s): NA, K, CL, CO2, GLUCOSE, BUN, CREATININE, CALCIUM in the last 72 hours. PSA 1.5 04/02/20  0.8 12/20 0.4 in 3/20.    No results found for this or any previous visit (from the past 24 hour(s)).   Studies/Results: Axumin PET   IMPRESSION: 1. Hypermetabolic left upper lobe pulmonary nodule. Differential considerations include prostate cancer metastasis and Axumin avid primary bronchogenic carcinoma (which has been reported). Consider tissue sampling. 2. Radiation seeds in the prostate with diffuse hypermetabolism, indeterminate. Cannot exclude residual or locally recurrent disease. 3. A smaller right upper lobe pulmonary nodule is below PET resolution. 4. Incidental findings, including: Aortic atherosclerosis (ICD10-I70.0), coronary artery atherosclerosis and emphysema (ICD10-J43.9). Tiny left pleural effusion. Bilateral external iliac artery aneurysms, progressive.     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   I will have him return in 3 months with a PSA and will consider second line therapy if the lung lesion proves to be prostatic in origin.    Left upper lobe nodule with possible prostate met vs bronchogenic CA.  I will send him to see Dr. Cyndia Bent with CT surgery for evaluation for a biopsy.   Orders Placed  This Encounter  Procedures  . PSA    Standing Status:   Future    Standing Expiration Date:   09/23/2020  . Ambulatory referral to Cardiothoracic Surgery    Referral Priority:   Urgent    Referral Type:   Surgical    Referral Reason:   Specialty Services Required    Referred to Provider:   Gaye Pollack, MD    Requested Specialty:   Cardiothoracic Surgery    Number of Visits Requested:   1  . POCT urinalysis dipstick      Return in about 3 months (around 08/23/2020) for with PSA.     CC: Janora Norlander, DO    Erik Mullins 05/25/2020

## 2020-05-24 ENCOUNTER — Other Ambulatory Visit: Payer: Self-pay

## 2020-05-24 ENCOUNTER — Encounter: Payer: Self-pay | Admitting: Urology

## 2020-05-24 ENCOUNTER — Ambulatory Visit (INDEPENDENT_AMBULATORY_CARE_PROVIDER_SITE_OTHER): Payer: Medicare Other | Admitting: Urology

## 2020-05-24 VITALS — BP 183/78 | HR 93 | Temp 98.0°F | Ht 71.0 in | Wt 174.0 lb

## 2020-05-24 DIAGNOSIS — C61 Malignant neoplasm of prostate: Secondary | ICD-10-CM | POA: Diagnosis not present

## 2020-05-24 DIAGNOSIS — R911 Solitary pulmonary nodule: Secondary | ICD-10-CM

## 2020-05-24 DIAGNOSIS — R9721 Rising PSA following treatment for malignant neoplasm of prostate: Secondary | ICD-10-CM | POA: Diagnosis not present

## 2020-05-27 ENCOUNTER — Encounter: Payer: Self-pay | Admitting: Urology

## 2020-06-04 ENCOUNTER — Other Ambulatory Visit: Payer: Self-pay | Admitting: *Deleted

## 2020-06-04 ENCOUNTER — Institutional Professional Consult (permissible substitution) (INDEPENDENT_AMBULATORY_CARE_PROVIDER_SITE_OTHER): Payer: Medicare Other | Admitting: Thoracic Surgery (Cardiothoracic Vascular Surgery)

## 2020-06-04 ENCOUNTER — Encounter: Payer: Self-pay | Admitting: Thoracic Surgery (Cardiothoracic Vascular Surgery)

## 2020-06-04 ENCOUNTER — Other Ambulatory Visit: Payer: Self-pay

## 2020-06-04 VITALS — BP 200/100 | HR 72 | Temp 97.6°F | Resp 20 | Ht 71.0 in | Wt 175.0 lb

## 2020-06-04 DIAGNOSIS — R911 Solitary pulmonary nodule: Secondary | ICD-10-CM

## 2020-06-04 NOTE — Progress Notes (Signed)
PCP is Janora Norlander, DO Referring Provider is Irine Seal, MD  Chief Complaint  Patient presents with  . Lung Lesion    Surgical consult, PET Scan 05/15/20 HX of Prostate cancer    HPI: Erik Mullins is sent for consultation regarding a left upper lobe lung nodule.  Erik Mullins is an 86 year old man with history of prostate cancer, hypertension, hyperlipidemia, persistent atrial fibrillation, AV nodal reentry tachycardia, coronary artery disease, cirrhosis, type 2 diabetes, abdominal aortic aneurysm, PAD, gout, hepatitis, and stroke.  Erik Mullins has a history of tobacco abuse smoking as much as a pack a day early on but then 1/4-1/2 a pack for many years prior to quitting in June 2021.  Erik Mullins was diagnosed with prostate cancer in 2010.  Erik Mullins was treated with radiation.  Erik Mullins had a PSA recurrence and was started on Lupron in 2017.  Recently Erik Mullins saw Dr. Jeffie Pollock.  His PSA was elevated.  A PET Axumin scan was done.  It showed a hypermetabolic left upper lobe lung nodule.  Erik Mullins is extremely hard of hearing and most of his history was obtained from his wife who accompanied him.  Erik Mullins was fairly active until about 6 to 8 months ago.  Erik Mullins is not having any chest pain, pressure, or tightness.  Erik Mullins does get short of breath with exertion.  Erik Mullins has been having issues with falls recently.  Erik Mullins recently fell and hit his right rib cage.  Erik Mullins also has been having issues with short-term memory loss.  Zubrod Score: At the time of surgery this patient's most appropriate activity status/level should be described as: []     0    Normal activity, no symptoms [x]     1    Restricted in physical strenuous activity but ambulatory, able to do out light work []     2    Ambulatory and capable of self care, unable to do work activities, up and about >50 % of waking hours                              []     3    Only limited self care, in bed greater than 50% of waking hours []     4    Completely disabled, no self care, confined to bed or chair []      5    Moribund  Past Medical History:  Diagnosis Date  . AAA (abdominal aortic aneurysm) ([place])    Remote ~1994   . Anemia   . Arthritis   . Atrial fibrillation [facility])    Diagnosed December 2014  . AVNRT (AV nodal re-entry tachycardia) ([place])    Possible  . Cataract    Bilateral  . Cirrhosis ([place])   . CKD (chronic kidney disease) stage 3, GFR 30-59 ml/min   . Essential hypertension   . Gout   . History of GI bleed   . History of gout   . History of hepatitis   . History of stroke   . Hyperlipidemia   . PAD (peripheral artery disease) ([facility])    Stenting in lower extremities (no records)  . Prostate cancer ([place])    XRT; in remission  . Type 2 diabetes mellitus ([place])     Past Surgical History:  Procedure Laterality Date  . ABDOMINAL AORTIC ANEURYSM REPAIR  1980's   in [place]  . APPENDECTOMY    . CAROTID ENDARTERECTOMY Right ~ 1999  . CATARACT  EXTRACTION W/ INTRAOCULAR LENS  IMPLANT, BILATERAL Bilateral 2014  . CHOLECYSTECTOMY    . COLON SURGERY    . PARTIAL COLECTOMY     "I was bleeding to death inside; took out 2/3 of my colon" (08/22/2013)    Family History  Problem Relation Age of Onset  . Cancer Mother        uterine deceased age 56  . Hypertension Sister   . Arrhythmia Other        Atrial fibrillation  . Hypertension Maternal Grandmother     Social History Social History   Tobacco Use  . Smoking status: Former Smoker    Packs/day: 0.25    Years: 70.00    Pack years: 17.50    Types: Cigarettes    Start date: 09/11/1944    Quit date: 02/17/2020    Years since quitting: 0.2  . Smokeless tobacco: Former Systems developer    Types: Snuff, Chew  Vaping Use  . Vaping Use: Never used  Substance Use Topics  . Alcohol use: Not Currently    Alcohol/week: 0.0 standard drinks    Comment: 08/22/2013 "drink ~ 1 beer and wine/yr"  . Drug use: No    Current Outpatient Medications  Medication Sig Dispense Refill  . albuterol (VENTOLIN HFA) 108 (90 Base) MCG/ACT  inhaler Inhale 1-2 puffs into the lungs every 6 (six) hours as needed for wheezing or shortness of breath. 18 g 2  . allopurinol (ZYLOPRIM) 100 MG tablet TAKE 1 TABLET BY MOUTH EVERY DAY 90 tablet 3  . betamethasone valerate ointment (VALISONE) 0.1 % Apply 1 application topically 2 (two) times daily.    . Calcium Carbonate-Vitamin D (CALTRATE 600+D) 600-400 MG-UNIT per tablet Take 1 tablet by mouth daily.      Marland Kitchen docusate sodium (COLACE) 100 MG capsule Take 100 mg by mouth daily.    Marland Kitchen lisinopril (ZESTRIL) 10 MG tablet TAKE 1 TABLET BY MOUTH EVERY DAY 90 tablet 0  . memantine (NAMENDA) 5 MG tablet Take 1 tablet (5 mg total) by mouth 2 (two) times daily. 180 tablet 3  . metoprolol tartrate (LOPRESSOR) 25 MG tablet TAKE 1/2 TABLET (12.5 MG TOTAL) BY MOUTH 2 (TWO) TIMES DAILY. 90 tablet 1  . Multiple Vitamin (MULTIVITAMIN) tablet Take 1 tablet by mouth daily.    Marland Kitchen umeclidinium-vilanterol (ANORO ELLIPTA) 62.5-25 MCG/INH AEPB Inhale 1 puff into the lungs daily. 60 each 12  . XARELTO 15 MG TABS tablet TAKE 1 TABLET (15 MG TOTAL) BY MOUTH DAILY WITH SUPPER. 90 tablet 0   No current facility-administered medications for this visit.    Allergies  Allergen Reactions  . Morphine Other (See Comments)    hallucinate    Review of Systems  HENT: Positive for hearing loss ("Legally deaf"). Negative for trouble swallowing and voice change.   Respiratory: Positive for shortness of breath.   Cardiovascular: Positive for palpitations. Negative for chest pain.  Genitourinary:       Prostate cancer  Musculoskeletal: Positive for arthralgias and joint swelling.  Skin: Positive for rash (Psoriasis).  Neurological: Positive for dizziness. Negative for weakness.       Short-term memory loss  Hematological: Bruises/bleeds easily (On Xarelto).  Psychiatric/Behavioral: Positive for dysphoric mood. The patient is nervous/anxious.     BP (!) 200/100   Pulse 72   Temp 97.6 F (36.4 C) (Skin)   Resp 20   Ht 5'  11" (1.803 m)   Wt 175 lb (79.4 kg)   SpO2 92% Comment: RA  BMI  24.41 kg/m  Physical Exam Constitutional:      Appearance: Normal appearance.  HENT:     Head: Normocephalic and atraumatic.  Neck:     Comments: Subcutaneous nodule lower right neck Cardiovascular:     Rate and Rhythm: Normal rate.     Heart sounds: Normal heart sounds. No murmur heard.      Comments: Irregularly irregular rhythm Pulmonary:     Effort: Pulmonary effort is normal. No respiratory distress.     Breath sounds: Normal breath sounds. No wheezing or rales.  Abdominal:     General: There is no distension.     Palpations: Abdomen is soft.  Musculoskeletal:        General: No swelling.     Cervical back: Neck supple.  Lymphadenopathy:     Cervical: No cervical adenopathy.  Skin:    General: Skin is warm and dry.  Neurological:     General: No focal deficit present.     Mental Status: Erik Mullins is alert and oriented to person, place, and time.     Cranial Nerves: No cranial nerve deficit.     Motor: No weakness.     Gait: Gait abnormal.    Diagnostic Tests: NUCLEAR MEDICINE PET SKULL BASE TO THIGH  TECHNIQUE: 9.7 mCi F-18 Fluciclovine was injected intravenously. Full-ring PET imaging was performed from the skull base to thigh after the radiotracer. CT data was obtained and used for attenuation correction and anatomic localization.  COMPARISON:  03/30/2016 abdominopelvic CT.  FINDINGS: NECK  No radiotracer activity in neck lymph nodes.  Incidental CT finding: Expected cerebral atrophy for age. No cervical adenopathy. Bilateral carotid atherosclerosis.  CHEST  No thoracic nodal hypermetabolism. A left upper lobe pulmonary nodule measures 1.0 cm and a S.U.V. max of 5.9 on 31/8.  Incidental CT finding: Mild centrilobular emphysema. A medial right upper lobe 4 mm nodule on 44/8 is below PET resolution.  Right lower lobe scarring.  Mild cardiomegaly. Trace left pleural fluid. Aortic  and coronary artery atherosclerosis.  ABDOMEN/PELVIS  Prostate: Radiation seeds within a diminutive prostate. Moderate diffuse hypermetabolism at a S.U.V. max of 4.1.  Lymph nodes: No abnormal radiotracer accumulation within pelvic or abdominal nodes.  Liver: No evidence of liver metastasis  Incidental CT finding: Cholecystectomy. Normal adrenal glands. Multiple left renal low-density lesions which are likely cysts. These are grossly similar to 03/30/2016 CT. These essentially replace the left renal parenchyma.  Status post aortic aneurysm repair/bypass. Right external iliac artery aneurysm at 2.2 cm on 182/4, increased from 1.9 cm previously.  Bilobed left external iliac artery aneurysm at 2.3 cm on 185/4. Increased from 2.1 cm on the prior.  No abdominopelvic adenopathy. Tiny fat containing right inguinal hernia.  SKELETON  No abnormal marrow activity.  Remote right rib fractures.  IMPRESSION: 1. Hypermetabolic left upper lobe pulmonary nodule. Differential considerations include prostate cancer metastasis and Axumin avid primary bronchogenic carcinoma (which has been reported). Consider tissue sampling. 2. Radiation seeds in the prostate with diffuse hypermetabolism, indeterminate. Cannot exclude residual or locally recurrent disease. 3. A smaller right upper lobe pulmonary nodule is below PET resolution. 4. Incidental findings, including: Aortic atherosclerosis (ICD10-I70.0), coronary artery atherosclerosis and emphysema (ICD10-J43.9). Tiny left pleural effusion. Bilateral external iliac artery aneurysms, progressive.   Electronically Signed   By: Abigail Miyamoto M.D.   On: 05/15/2020 09:05 I personally reviewed the PET Axumin and CT images and concur with the findings noted above  Impression: Erik Mullins is an 86 year old man with history  of prostate cancer, hypertension, hyperlipidemia, persistent atrial fibrillation, AV nodal reentry  tachycardia, coronary artery disease, cirrhosis, type 2 diabetes, abdominal aortic aneurysm, PAD, gout, hepatitis, and stroke.  Erik Mullins has a history of tobacco abuse smoking as much as a pack a day early on but then 1/4-1/2 a pack for many years prior to quitting in June 2021.  Erik Mullins recently was noted to have an elevated PSA.  Work-up included a PET Axumin scan which showed a new 1 cm left upper lobe lung nodule that was Axumin avid.  Although design for prostate cancer, per radiology this does not totally rule out the possibility of a primary bronchogenic carcinoma.  Erik Mullins needs a biopsy to determine course of therapy.  Erik Mullins is not a good surgical candidate given his advanced age and comorbidities.  Erik Mullins would potentially be a candidate for stereotactic radiation if this were primary bronchogenic carcinoma.  I think the best option in his case would be to do a CT-guided needle biopsy, if interventional radiology is willing to do so.  If not we could try a navigational bronchoscopy.  There is an airway that leads to the vicinity of the nodule but it appears to take a right angle so it might be difficult to get good samples.  If IR is not willing to attempt a CT-guided needle biopsy, we will plan to do a navigational bronchoscopy.  Erik Mullins would need a CT with super D protocol prior to the procedure.  Persistent atrial fibrillation - Erik Mullins is on Xarelto and will need to hold that for 48 hours prior to biopsy.  Plan: We will consult IR for possible CT-guided needle biopsy. If that is not possible we will plan for super D CT and navigational bronchoscopy. Return in 2 weeks to discuss results  Melrose Nakayama, MD [facility] [phone]

## 2020-06-05 ENCOUNTER — Encounter (HOSPITAL_COMMUNITY): Payer: Self-pay | Admitting: Radiology

## 2020-06-05 NOTE — Progress Notes (Signed)
Erik Mullins, 86 y.o., Jun 15, 1931 MRN:  [id number] Phone:  [phone] (H) PCP:  Janora Norlander, DO Primary Cvg:  Medicare/Medicare Part A And B Next Appt With Cardiothoracic Surgery 06/18/2020 at 2:45 PM  RE: CT Biopsy Received: Today Markus Daft, MD  Arlyn Leak for CT guided biopsy of left upper lobe nodule.   [place]       Previous Messages   ----- Message -----  From: Garth Bigness D  Sent: 06/04/2020  5:09 PM EDT  To: Ir Procedure Requests  Subject: CT Biopsy                     Procedure: CT Biopsy   Reason: Nodule of left lung   History: NM PET in computer   Provider: [place] Charon C   Provider Contact: [phone]

## 2020-06-05 NOTE — Progress Notes (Signed)
Erik Mullins Male, 86 y.o., Dec 30, 1930 MRN:  [id number] Phone:  [phone] (H) PCP:  Janora Norlander, DO Primary Cvg:  Medicare/Medicare Part A And B Next Appt With Cardiothoracic Surgery 06/18/2020 at 2:45 PM  RE: CT Biopsy Received: Today Erik Nakayama, MD  Erik Mullins Yes it is fine to hold the Xarelto for 1- 2 days prior   Thank you   [facility]       Previous Messages   ----- Message -----  From: Erik Mullins  Sent: 06/05/2020  8:39 AM EDT  To: Erik Nakayama, MD  Subject: FW: CT Biopsy                   Good morning, patient is on Xarelto and for biopsy we will need to hold for 1 day prior. Please advise if okay to hold Xarelto for 1 day prior to biopsy. Thanks Erik Mullins  ----- Message -----  From: Erik Daft, MD  Sent: 06/05/2020  7:49 AM EDT  To: Erik Mullins  Subject: RE: Erik Mullins for CT guided biopsy of left upper lobe nodule.   Erik Mullins  ----- Message -----  From: Erik Mullins  Sent: 06/04/2020  5:09 PM EDT  To: Erik Mullins  Subject: CT Biopsy                     Procedure: CT Biopsy   Reason: Nodule of left lung   History: NM PET in computer   Provider: [place] Charon Mullins   Provider Contact: [phone]

## 2020-06-12 ENCOUNTER — Other Ambulatory Visit: Payer: Self-pay | Admitting: Family Medicine

## 2020-06-12 DIAGNOSIS — N183 Chronic kidney disease, stage 3 unspecified: Secondary | ICD-10-CM

## 2020-06-12 DIAGNOSIS — I10 Essential (primary) hypertension: Secondary | ICD-10-CM

## 2020-06-13 ENCOUNTER — Other Ambulatory Visit: Payer: Self-pay | Admitting: Radiology

## 2020-06-14 ENCOUNTER — Other Ambulatory Visit: Payer: Self-pay

## 2020-06-14 ENCOUNTER — Other Ambulatory Visit (HOSPITAL_COMMUNITY)
Admission: RE | Admit: 2020-06-14 | Discharge: 2020-06-14 | Disposition: A | Discharge status: Home / Self Care | Payer: Medicare Other | Source: Ambulatory Visit | Attending: Thoracic Surgery (Cardiothoracic Vascular Surgery) | Admitting: Thoracic Surgery (Cardiothoracic Vascular Surgery)

## 2020-06-14 ENCOUNTER — Ambulatory Visit (HOSPITAL_COMMUNITY): Payer: Medicare Other

## 2020-06-14 DIAGNOSIS — Z20822 Contact with and (suspected) exposure to covid-19: Secondary | ICD-10-CM | POA: Diagnosis not present

## 2020-06-14 DIAGNOSIS — Z01812 Encounter for preprocedural laboratory examination: Secondary | ICD-10-CM | POA: Diagnosis not present

## 2020-06-14 LAB — SARS CORONAVIRUS 2 (TAT 6-24 HRS): SARS Coronavirus 2: NEGATIVE

## 2020-06-17 ENCOUNTER — Ambulatory Visit (HOSPITAL_COMMUNITY)
Admission: RE | Admit: 2020-06-17 | Discharge: 2020-06-17 | Disposition: A | Discharge status: Home / Self Care | Payer: Medicare Other | Source: Ambulatory Visit | Attending: Interventional Radiology | Admitting: Interventional Radiology

## 2020-06-17 ENCOUNTER — Ambulatory Visit (HOSPITAL_COMMUNITY)
Admission: RE | Admit: 2020-06-17 | Discharge: 2020-06-17 | Disposition: A | Discharge status: Home / Self Care | Payer: Medicare Other | Source: Ambulatory Visit | Attending: Thoracic Surgery (Cardiothoracic Vascular Surgery) | Admitting: Thoracic Surgery (Cardiothoracic Vascular Surgery)

## 2020-06-17 ENCOUNTER — Other Ambulatory Visit: Payer: Self-pay

## 2020-06-17 DIAGNOSIS — I129 Hypertensive chronic kidney disease with stage 1 through stage 4 chronic kidney disease, or unspecified chronic kidney disease: Secondary | ICD-10-CM | POA: Insufficient documentation

## 2020-06-17 DIAGNOSIS — E119 Type 2 diabetes mellitus without complications: Secondary | ICD-10-CM | POA: Diagnosis not present

## 2020-06-17 DIAGNOSIS — Z7901 Long term (current) use of anticoagulants: Secondary | ICD-10-CM | POA: Insufficient documentation

## 2020-06-17 DIAGNOSIS — M1990 Unspecified osteoarthritis, unspecified site: Secondary | ICD-10-CM | POA: Diagnosis not present

## 2020-06-17 DIAGNOSIS — Z87891 Personal history of nicotine dependence: Secondary | ICD-10-CM | POA: Diagnosis not present

## 2020-06-17 DIAGNOSIS — E785 Hyperlipidemia, unspecified: Secondary | ICD-10-CM | POA: Diagnosis not present

## 2020-06-17 DIAGNOSIS — R911 Solitary pulmonary nodule: Secondary | ICD-10-CM | POA: Diagnosis not present

## 2020-06-17 DIAGNOSIS — E1122 Type 2 diabetes mellitus with diabetic chronic kidney disease: Secondary | ICD-10-CM | POA: Diagnosis not present

## 2020-06-17 DIAGNOSIS — Z9889 Other specified postprocedural states: Secondary | ICD-10-CM

## 2020-06-17 DIAGNOSIS — N183 Chronic kidney disease, stage 3 unspecified: Secondary | ICD-10-CM | POA: Insufficient documentation

## 2020-06-17 DIAGNOSIS — Z8673 Personal history of transient ischemic attack (TIA), and cerebral infarction without residual deficits: Secondary | ICD-10-CM | POA: Insufficient documentation

## 2020-06-17 DIAGNOSIS — C3412 Malignant neoplasm of upper lobe, left bronchus or lung: Secondary | ICD-10-CM | POA: Diagnosis not present

## 2020-06-17 DIAGNOSIS — Z8546 Personal history of malignant neoplasm of prostate: Secondary | ICD-10-CM | POA: Diagnosis not present

## 2020-06-17 DIAGNOSIS — K7460 Unspecified cirrhosis of liver: Secondary | ICD-10-CM | POA: Diagnosis not present

## 2020-06-17 DIAGNOSIS — E1151 Type 2 diabetes mellitus with diabetic peripheral angiopathy without gangrene: Secondary | ICD-10-CM | POA: Diagnosis not present

## 2020-06-17 LAB — CBC
HCT: 49.9 % (ref 39.0–52.0)
Hemoglobin: 16 g/dL (ref 13.0–17.0)
MCH: 29.6 pg (ref 26.0–34.0)
MCHC: 32.1 g/dL (ref 30.0–36.0)
MCV: 92.2 fL (ref 80.0–100.0)
Platelets: 216 10*3/uL (ref 150–400)
RBC: 5.41 MIL/uL (ref 4.22–5.81)
RDW: 13.5 % (ref 11.5–15.5)
WBC: 10.5 10*3/uL (ref 4.0–10.5)
nRBC: 0 % (ref 0.0–0.2)

## 2020-06-17 LAB — GLUCOSE, CAPILLARY: Glucose-Capillary: 131 mg/dL — ABNORMAL HIGH (ref 70–99)

## 2020-06-17 MED ORDER — MIDAZOLAM HCL 2 MG/2ML IJ SOLN
INTRAMUSCULAR | Status: AC | PRN
  Administered 2020-06-17: 0.5 mg via INTRAVENOUS

## 2020-06-17 MED ORDER — SODIUM CHLORIDE 0.9 % IV SOLN: INTRAVENOUS | Status: DC

## 2020-06-17 MED ORDER — MIDAZOLAM HCL 2 MG/2ML IJ SOLN
INTRAMUSCULAR | Status: AC
  Filled 2020-06-17: qty 2

## 2020-06-17 MED ORDER — LIDOCAINE HCL 1 % IJ SOLN
INTRAMUSCULAR | Status: AC
  Filled 2020-06-17: qty 20

## 2020-06-17 MED ORDER — FENTANYL CITRATE (PF) 100 MCG/2ML IJ SOLN
INTRAMUSCULAR | Status: AC
  Filled 2020-06-17: qty 2

## 2020-06-17 MED ORDER — GELATIN ABSORBABLE 12-7 MM EX MISC
CUTANEOUS | Status: AC
  Filled 2020-06-17: qty 1

## 2020-06-17 MED ORDER — FENTANYL CITRATE (PF) 100 MCG/2ML IJ SOLN
INTRAMUSCULAR | Status: AC | PRN
  Administered 2020-06-17: 25 ug via INTRAVENOUS

## 2020-06-17 NOTE — H&P (Signed)
Chief Complaint: LUL nodule. Request is for LUL biopsy  Referring Physician(s): Hendrickson,Steven C  Supervising Physician: Sandi Mariscal  Patient Status: [facility] - Out-pt  History of Present Illness: Erik Mullins is a 86 y.o. male Former Smoker, History of HTN, HLD, a fib, cirrhosis, DM, Hepatitis, CVA, AAA s/p repair, recurrent prostate cancer found to have elevated PSA Allergies include Morphine. PET from 8.31.21 reads Hypermetabolic left upper lobe pulmonary nodule. Differential considerations include prostate cancer metastasis and Axumin avid primary bronchogenic carcinoma (which has been reported). Consider tissue sampling Team is request LUL nodule biopsy for further determination of metastases versus new primary.   Past Medical History:  Diagnosis Date  . AAA (abdominal aortic aneurysm) ([place])    Remote ~1994   . Anemia   . Arthritis   . Atrial fibrillation [facility])    Diagnosed December 2014  . AVNRT (AV nodal re-entry tachycardia) ([place])    Possible  . Cataract    Bilateral  . Cirrhosis ([place])   . CKD (chronic kidney disease) stage 3, GFR 30-59 ml/min   . Essential hypertension   . Gout   . History of GI bleed   . History of gout   . History of hepatitis   . History of stroke   . Hyperlipidemia   . PAD (peripheral artery disease) ([facility])    Stenting in lower extremities (no records)  . Prostate cancer ([place])    XRT; in remission  . Type 2 diabetes mellitus ([place])     Past Surgical History:  Procedure Laterality Date  . ABDOMINAL AORTIC ANEURYSM REPAIR  1980's   in [place]  . APPENDECTOMY    . CAROTID ENDARTERECTOMY Right ~ 1999  . CATARACT EXTRACTION W/ INTRAOCULAR LENS  IMPLANT, BILATERAL Bilateral 2014  . CHOLECYSTECTOMY    . COLON SURGERY    . PARTIAL COLECTOMY     "I was bleeding to death inside; took out 2/3 of my colon" (08/22/2013)    Allergies: Morphine  Medications: Prior to Admission medications   Medication Sig Start Date End Date  Taking? Authorizing Provider  acetaminophen (TYLENOL) 500 MG tablet Take 1,000 mg by mouth every 6 (six) hours as needed for headache.   Yes [provider]  albuterol (VENTOLIN HFA) 108 (90 Base) MCG/ACT inhaler Inhale 1-2 puffs into the lungs every 6 (six) hours as needed for wheezing or shortness of breath. Patient taking differently: Inhale 2 puffs into the lungs every 6 (six) hours as needed for wheezing or shortness of breath.  03/03/19  Yes Gottschalk, Ashly M, DO  allopurinol (ZYLOPRIM) 100 MG tablet TAKE 1 TABLET BY MOUTH EVERY DAY Patient taking differently: Take 100 mg by mouth daily.  11/13/19  Yes Gottschalk, Ashly M, DO  Alpha-D-Galactosidase (BEANO ULTRA 800) TABS Take 1,600 mg by mouth daily as needed (gas).   Yes [provider]  betamethasone valerate ointment (VALISONE) 0.1 % Apply 1 application topically 2 (two) times daily as needed (psoriasis).    Yes [provider]  Calcium Carbonate-Vitamin D (CALTRATE 600+D) 600-400 MG-UNIT per tablet Take 1 tablet by mouth daily.     Yes [provider]  Carboxymethylcellulose Sodium (ARTIFICIAL TEARS OP) Place 1 drop into both eyes daily as needed (dry eyes).   Yes [provider]  docusate sodium (COLACE) 100 MG capsule Take 100 mg by mouth at bedtime.    Yes [provider]  lisinopril (ZESTRIL) 10 MG tablet TAKE 1 TABLET BY MOUTH EVERY DAY 06/12/20  Yes  Ronnie Doss M, DO  memantine (NAMENDA) 5 MG tablet Take 1 tablet (5 mg total) by mouth 2 (two) times daily. Patient taking differently: Take 2.5 mg by mouth 2 (two) times daily.  02/13/20  Yes Stacks, Cletus Gash, MD  metoprolol tartrate (LOPRESSOR) 25 MG tablet TAKE 1/2 TABLET (12.5 MG TOTAL) BY MOUTH 2 (TWO) TIMES DAILY. Patient taking differently: Take 12.5 mg by mouth 2 (two) times daily.  02/13/20  Yes Satira Sark, MD  Multiple Vitamin (MULTIVITAMIN) tablet Take 1 tablet by mouth daily.   Yes [provider]    umeclidinium-vilanterol (ANORO ELLIPTA) 62.5-25 MCG/INH AEPB Inhale 1 puff into the lungs daily. Patient taking differently: Inhale 1 puff into the lungs daily as needed (shortness of breath).  01/05/20  Yes Gottschalk, Ashly M, DO  XARELTO 15 MG TABS tablet TAKE 1 TABLET (15 MG TOTAL) BY MOUTH DAILY WITH SUPPER. 05/03/17  Yes Satira Sark, MD     Family History  Problem Relation Age of Onset  . Cancer Mother        uterine deceased age 10  . Hypertension Sister   . Arrhythmia Other        Atrial fibrillation  . Hypertension Maternal Grandmother     Social History   Socioeconomic History  . Marital status: Married    Spouse name: Not on file  . Number of children: Not on file  . Years of education: Not on file  . Highest education level: Not on file  Occupational History  . Not on file  Tobacco Use  . Smoking status: Former Smoker    Packs/day: 0.25    Years: 70.00    Pack years: 17.50    Types: Cigarettes    Start date: 09/11/1944    Quit date: 02/17/2020    Years since quitting: 0.3  . Smokeless tobacco: Former Systems developer    Types: Snuff, Chew  Vaping Use  . Vaping Use: Never used  Substance and Sexual Activity  . Alcohol use: Not Currently    Alcohol/week: 0.0 standard drinks    Comment: 08/22/2013 "drink ~ 1 beer and wine/yr"  . Drug use: No  . Sexual activity: Never  Other Topics Concern  . Not on file  Social History Narrative   From [place]. Relocated to this area from [facility].   Social Determinants of Health   Financial Resource Strain:   . Difficulty of Paying Living Expenses: Not on file  Food Insecurity:   . Worried About Charity fundraiser in the Last Year: Not on file  . Ran Out of Food in the Last Year: Not on file  Transportation Needs:   . Lack of Transportation (Medical): Not on file  . Lack of Transportation (Non-Medical): Not on file  Physical Activity:   . Days of Exercise per Week: Not on file  . Minutes of Exercise per Session:  Not on file  Stress:   . Feeling of Stress : Not on file  Social Connections:   . Frequency of Communication with Friends and Family: Not on file  . Frequency of Social Gatherings with Friends and Family: Not on file  . Attends Religious Services: Not on file  . Active Member of Clubs or Organizations: Not on file  . Attends Archivist Meetings: Not on file  . Marital Status: Not on file     Review of Systems: A 12 point ROS discussed and pertinent positives are indicated in the HPI above.  All other  systems are negative.  Review of Systems  Constitutional: Negative for fever.  HENT: Negative for congestion.   Respiratory: Positive for shortness of breath. Negative for cough.   Cardiovascular: Negative for chest pain.  Gastrointestinal: Negative for abdominal pain.  Neurological: Negative for headaches.  Psychiatric/Behavioral: Negative for behavioral problems and confusion.    Vital Signs: BP (!) 178/101   Pulse (!) 111   Temp (!) 97.5 F (36.4 C) (Oral)   Ht 5\' 11"  (1.803 m)   Wt 163 lb (73.9 kg)   SpO2 99%   BMI 22.73 kg/m   Physical Exam Vitals and nursing note reviewed.  Constitutional:      Appearance: He is well-developed.  HENT:     Head: Normocephalic.  Cardiovascular:     Rate and Rhythm: Normal rate and regular rhythm.     Heart sounds: Normal heart sounds.  Pulmonary:     Effort: Pulmonary effort is normal.     Comments: Diminished throughout.  Musculoskeletal:        General: Normal range of motion.     Cervical back: Normal range of motion.  Skin:    General: Skin is dry.     Comments: Left shin wound healing no drainage  Neurological:     Mental Status: He is alert and oriented to person, place, and time.     Imaging: No results found.  Labs:  CBC: Recent Labs    08/29/19 0944 06/17/20 0917  WBC 8.0 10.5  HGB 16.5 16.0  HCT 50.1 49.9  PLT 177 216    COAGS: No results for input(s): INR, APTT in the last 8760  hours.  BMP: Recent Labs    08/29/19 0944 04/02/20 0853  NA 145* 145*  K 5.2 4.9  CL 104 106  CO2 23 24  GLUCOSE 96 101*  BUN 24 34*  CALCIUM 9.9 9.6  CREATININE 1.78* 1.95*  GFRNONAA 34* 30*  GFRAA 39* 35*    LIVER FUNCTION TESTS: Recent Labs    04/02/20 0853  BILITOT 0.3  AST 17  ALT 10  ALKPHOS 102  PROT 6.7  ALBUMIN 4.3    Assessment and Plan:  86 y.o. old male outpatient. Former Smoker, History of HTN, HLD, a fib, cirrhosis, DM, Hepatitis, CVA, AAA s/p repair, recurrent prostate cancer found to have elevated PSA . PET from 8.31.21 reads Hypermetabolic left upper lobe pulmonary nodule. Differential considerations include prostate cancer metastasis and Axumin avid primary bronchogenic carcinoma (which has been reported). Consider tissue sampling Team is request LUL nodule biopsy for further determination of metastases versus new primary.   All labs are within acceptable parameters. Patient  is currently on testosterone and Xarelto. Allergies include Morphine. Last dose given on  10.2.21 . NPO since midnight.  Risks and benefits of left upper lobe lung nodule was discussed with the patient and/or patient's family including, but not limited to bleeding, infection, damage to adjacent structures or low yield requiring additional tests.  All of the questions were answered and there is agreement to proceed.  Consent signed and in chart.  Thank you for this interesting consult.  I greatly enjoyed meeting Erik Mullins and look forward to participating in their care.  A copy of this report was sent to the requesting provider on this date.  Electronically Signed: Jacqualine Mau, NP 06/17/2020, 10:53 AM   I spent a total of  40 Minutes   in face to face in clinical consultation, greater than 50% of which was  counseling/coordinating care for LUL nodule.

## 2020-06-17 NOTE — Discharge Instructions (Addendum)
RESTART XARELTO TOMORROW 10/5  Lung Biopsy, Care After This sheet gives you information about how to care for yourself after your procedure. Your health care provider may also give you more specific instructions depending on the type of biopsy you had. If you have problems or questions, contact your health care provider. What can I expect after the procedure? After the procedure, it is common to have:  A cough.  A sore throat.  Pain where a needle, bronchoscope, or incision was used to collect a biopsy sample (biopsy site). Follow these instructions at home: Medicines  Take over-the-counter and prescription medicines only as told by your health care provider.  Do not drink alcohol if your health care provider tells you not to drink.  Ask your health care provider if the medicine prescribed to you: ? Requires you to avoid driving or using heavy machinery. ? Can cause constipation. You may need to take these actions to prevent or treat constipation:  Drink enough fluid to keep your urine pale yellow.  Take over-the-counter or prescription medicines.  Eat foods that are high in fiber, such as beans, whole grains, and fresh fruits and vegetables.  Limit foods that are high in fat and processed sugars, such as fried or sweet foods.  Do not drive for 24 hours if you were given a sedative. Biopsy site care   Follow instructions from your health care provider about how to take care of your biopsy site. Make sure you: ? Wash your hands with soap and water before and after you change your bandage (dressing). If soap and water are not available, use hand sanitizer. ? Change your dressing as told by your health care provider. ? Leave stitches (sutures), skin glue, or adhesive strips in place. These skin closures may need to stay in place for 2 weeks or longer. If adhesive strip edges start to loosen and curl up, you may trim the loose edges. Do not remove adhesive strips completely unless  your health care provider tells you to do that.  Do not take baths, swim, or use a hot tub until your health care provider approves. Ask your health care provider if you may take showers. You may only be allowed to take sponge baths.  Check your biopsy site every day for signs of infection. Check for: ? Redness, swelling, or more pain. ? Fluid or blood. ? Warmth. ? Pus or a bad smell. General instructions  Return to your normal activities as told by your health care provider. Ask your health care provider what activities are safe for you.  It is up to you to get the results of your procedure. Ask your health care provider, or the department that is doing the procedure, when your results will be ready.  Keep all follow-up visits as told by your health care provider. This is important. Contact a health care provider if:  You have a fever.  You have redness, swelling, or more pain around your biopsy site.  You have fluid or blood coming from your biopsy site.  Your biopsy site feels warm to the touch.  You have pus or a bad smell coming from your biopsy site.  You have pain that does not get better with medicine. Get help right away if:  You cough up blood.  You have trouble breathing.  You have chest pain.  You lose consciousness. Summary  After the procedure, it is common to have a sore throat and a cough.  Return to your normal  activities as told by your health care provider. Ask your health care provider what activities are safe for you.  Take over-the-counter and prescription medicines only as told by your health care provider.  Report any unusual symptoms to your health care provider. This information is not intended to replace advice given to you by your health care provider. Make sure you discuss any questions you have with your health care provider. Document Revised: 10/05/2018 Document Reviewed: 09/29/2016 Elsevier Patient Education  [place]. Moderate Conscious Sedation, Adult Sedation is the use of medicines to promote relaxation and relieve discomfort and anxiety. Moderate conscious sedation is a type of sedation. Under moderate conscious sedation, you are less alert than normal, but you are still able to respond to instructions, touch, or both. Moderate conscious sedation is used during short medical and dental procedures. It is milder than deep sedation, which is a type of sedation under which you cannot be easily woken up. It is also milder than general anesthesia, which is the use of medicines to make you unconscious. Moderate conscious sedation allows you to return to your regular activities sooner. Tell a health care provider about:  Any allergies you have.  All medicines you are taking, including vitamins, herbs, eye drops, creams, and over-the-counter medicines.  Use of steroids (by mouth or creams).  Any problems you or family members have had with sedatives and anesthetic medicines.  Any blood disorders you have.  Any surgeries you have had.  Any medical conditions you have, such as sleep apnea.  Whether you are pregnant or may be pregnant.  Any use of cigarettes, alcohol, marijuana, or street drugs. What are the risks? Generally, this is a safe procedure. However, problems may occur, including:  Getting too much medicine (oversedation).  Nausea.  Allergic reaction to medicines.  Trouble breathing. If this happens, a breathing tube may be used to help with breathing. It will be removed when you are awake and breathing on your own.  Heart trouble.  Lung trouble. What happens before the procedure? Staying hydrated Follow instructions from your health care provider about hydration, which may include:  Up to 2 hours before the procedure - you may continue to drink clear liquids, such as water, clear fruit juice, black coffee, and plain tea. Eating and drinking restrictions Follow instructions from  your health care provider about eating and drinking, which may include:  8 hours before the procedure - stop eating heavy meals or foods such as meat, fried foods, or fatty foods.  6 hours before the procedure - stop eating light meals or foods, such as toast or cereal.  6 hours before the procedure - stop drinking milk or drinks that contain milk.  2 hours before the procedure - stop drinking clear liquids. Medicine Ask your health care provider about:  Changing or stopping your regular medicines. This is especially important if you are taking diabetes medicines or blood thinners.  Taking medicines such as aspirin and ibuprofen. These medicines can thin your blood. Do not take these medicines before your procedure if your health care provider instructs you not to.  Tests and exams  You will have a physical exam.  You may have blood tests done to show: ? How well your kidneys and liver are working. ? How well your blood can clot. General instructions  Plan to have someone take you home from the hospital or clinic.  If you will be going home right after the procedure, plan to have someone  with you for 24 hours. What happens during the procedure?  An IV tube will be inserted into one of your veins.  Medicine to help you relax (sedative) will be given through the IV tube.  The medical or dental procedure will be performed. What happens after the procedure?  Your blood pressure, heart rate, breathing rate, and blood oxygen level will be monitored often until the medicines you were given have worn off.  Do not drive for 24 hours. This information is not intended to replace advice given to you by your health care provider. Make sure you discuss any questions you have with your health care provider. Document Revised: 08/13/2017 Document Reviewed: 12/21/2015 Elsevier Patient Education  2020 [employer].

## 2020-06-17 NOTE — Procedures (Signed)
Pre procedural Dx: Left upper lobe pulmonary nodule  Post procedural Dx: Same  Technically successful CT guided biopsy of left upper lobe pulmonary nodule   EBL: None.  Complications: None immediate.   Ronny Bacon, MD Pager #: [phone]

## 2020-06-17 NOTE — Sedation Documentation (Signed)
Patient is resting comfortably. 

## 2020-06-17 NOTE — Progress Notes (Signed)
Pt c/o chest pain anterior left upper chest, dull ache constant but worse with inspiration,  9/10 vss spo2 92, 161/88, reported from [facility] /radiology pt has been hypertensive throughout case. Anderson Malta PA for Dr Pascal Lux was paged and arrived to assess pt, pt has to sit straight up for comfort,has slight cough and clearing of throat, O2 [place] 3L placed per PA. CXR was called to see if it could be done now.

## 2020-06-17 NOTE — Progress Notes (Signed)
Pt's CXR report called to PA, [name]. Pt states his pain is much better. Pt eating lunch in no distress.

## 2020-06-17 NOTE — Progress Notes (Signed)
Called to patient's bedside by [facility] RN. Patient experiencing pinpoint tenderness superior to right perctoral muscle with SHOB in high fowlers position . Patient endorses denies any nausea vomiting diaphoresis. Condition is made worse with movement and inspiration.  Patient placed on nasal cannula with stat chest xray ordered. Dr. Owens Shark, IR Attending made aware.  Chest xray negative for pneumothorax. Per SS Rn patient condition improved. Patient is currently eating and vital signs are wnl. Not further intervention is needed at this time.

## 2020-06-18 ENCOUNTER — Encounter: Payer: Medicare Other | Admitting: Thoracic Surgery (Cardiothoracic Vascular Surgery)

## 2020-06-19 LAB — SURGICAL PATHOLOGY

## 2020-06-20 ENCOUNTER — Ambulatory Visit (INDEPENDENT_AMBULATORY_CARE_PROVIDER_SITE_OTHER): Payer: Medicare Other | Admitting: Thoracic Surgery (Cardiothoracic Vascular Surgery)

## 2020-06-20 ENCOUNTER — Encounter: Payer: Self-pay | Admitting: Thoracic Surgery (Cardiothoracic Vascular Surgery)

## 2020-06-20 ENCOUNTER — Other Ambulatory Visit: Payer: Self-pay

## 2020-06-20 DIAGNOSIS — R911 Solitary pulmonary nodule: Secondary | ICD-10-CM

## 2020-06-20 NOTE — Progress Notes (Signed)
      [address]       [place] [address]             [phone]      Patient ID: Erik Mullins, male   DOB: 1931-02-10, 86 y.o.   MRN: [id number]  Erik Mullins returns to discuss the results of his CT-guided needle biopsy.  Erik Mullins is an 86 year old man with a history of prostate cancer, hypertension, hyperlipidemia, atrial fibrillation, AV nodal reentry tachycardia, CAD, cirrhosis, diabetes, AAA, PAD, gout, hepatitis, and stroke.  He also is suffering from a some dementia.  He has a history of tobacco abuse until quitting in June 2021.  He recently had a follow-up regarding his prostate cancer.  His PSA was elevated so a PET Axumin scan was done.  It showed a hypermetabolic left upper lobe lung nodule.  I felt this was better access with a CT-guided needle biopsy.  That was done on 06/17/2020.  FINAL MICROSCOPIC DIAGNOSIS:   A. LUNG, LEFT UPPER LOBE, NEEDLE CORE BIOPSY:  - Squamous cell carcinoma.   I reviewed with Mr. and Mrs. 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  Treatment options include surgical resection and radiation therapy, specifically stereotactic radiation.  He is not a surgical candidate given his advanced age, dementia, poor functional status, and comorbidities.    I am going to make a referral to radiation oncology.  He prefers to do that in [place].  Revonda Standard Roxan Hockey, MD [facility] [phone]

## 2020-06-21 ENCOUNTER — Encounter: Payer: Medicare Other | Admitting: Thoracic Surgery (Cardiothoracic Vascular Surgery)

## 2020-06-27 ENCOUNTER — Other Ambulatory Visit: Payer: Self-pay

## 2020-06-27 ENCOUNTER — Other Ambulatory Visit: Payer: Self-pay | Admitting: *Deleted

## 2020-06-27 ENCOUNTER — Inpatient Hospital Stay (HOSPITAL_COMMUNITY): Payer: Medicare Other | Attending: Hematology | Admitting: Hematology

## 2020-06-27 DIAGNOSIS — C3492 Malignant neoplasm of unspecified part of left bronchus or lung: Secondary | ICD-10-CM

## 2020-06-27 DIAGNOSIS — Z23 Encounter for immunization: Secondary | ICD-10-CM | POA: Insufficient documentation

## 2020-06-27 DIAGNOSIS — N189 Chronic kidney disease, unspecified: Secondary | ICD-10-CM

## 2020-06-27 DIAGNOSIS — C3411 Malignant neoplasm of upper lobe, right bronchus or lung: Secondary | ICD-10-CM | POA: Diagnosis not present

## 2020-06-27 DIAGNOSIS — Z808 Family history of malignant neoplasm of other organs or systems: Secondary | ICD-10-CM | POA: Insufficient documentation

## 2020-06-27 DIAGNOSIS — Z87891 Personal history of nicotine dependence: Secondary | ICD-10-CM

## 2020-06-27 DIAGNOSIS — C61 Malignant neoplasm of prostate: Secondary | ICD-10-CM

## 2020-06-27 MED ORDER — INFLUENZA VAC A&B SA ADJ QUAD 0.5 ML IM PRSY
0.5000 mL | PREFILLED_SYRINGE | Freq: Once | INTRAMUSCULAR | Status: AC
  Administered 2020-06-27: 0.5 mL via INTRAMUSCULAR
  Filled 2020-06-27: qty 0.5

## 2020-06-27 NOTE — Progress Notes (Signed)
[facility] [address], [place] [address]   CLINIC:  Medical Oncology/Hematology  CONSULT NOTE  Patient Care Team: Janora Norlander, DO as PCP - General (Family Medicine) Satira Sark, MD as PCP - Cardiology (Cardiology) Satira Sark, MD (Cardiology) Irine Seal, MD as Attending Physician (Urology)  CHIEF COMPLAINTS/PURPOSE OF CONSULTATION:  Evaluation of left squamous cell lung cancer  HISTORY OF PRESENTING ILLNESS:  Erik Mullins 86 y.o. male is here because of evaluation of left squamous cell lung cancer, at the request of Dr. Modesto Charon from [facility].  Today he is accompanied by his wife, [name]. According to his wife, he is hard of hearing and slightly demented, though he remembers the names of family members. He used to be active and play golf three to four times per week, but he is not able to do that any longer due to easy fatigability and SOB. He uses a wheelchair, but is ambulatory and denies weakness in his extremities. He was treated with radiation for 20 days and seeds in [place] 11 years ago. He has a chronic dry cough, but denies hemoptysis. His appetite is good.  He quit smoking in 02/2020, peaking at 1 PPD, smoking for 76 years. 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 His mother had ovarian cancer; his half-brother had prostate cancer.  MEDICAL HISTORY:  Past Medical History:  Diagnosis Date  . AAA (abdominal aortic aneurysm) ([facility])    Remote ~1994   . Anemia   . Arthritis   . Atrial fibrillation [facility])    Diagnosed December 2014  . AVNRT (AV nodal re-entry tachycardia) ([facility])    Possible  . Cataract    Bilateral  . Cirrhosis ([place])   . CKD (chronic kidney disease) stage 3, GFR 30-59 ml/min (HCC)   . Essential hypertension   . Gout   . History of GI bleed   . History of gout   . History of hepatitis   . History of stroke   . Hyperlipidemia   . PAD (peripheral artery  disease) (HCC)    Stenting in lower extremities (no records)  . Prostate cancer ([place])    XRT; in remission  . Type 2 diabetes mellitus ([facility])     SURGICAL HISTORY: Past Surgical History:  Procedure Laterality Date  . ABDOMINAL AORTIC ANEURYSM REPAIR  1980's   in [place]  . APPENDECTOMY    . CAROTID ENDARTERECTOMY Right ~ 1999  . CATARACT EXTRACTION W/ INTRAOCULAR LENS  IMPLANT, BILATERAL Bilateral 2014  . CHOLECYSTECTOMY    . COLON SURGERY    . PARTIAL COLECTOMY     "I was bleeding to death inside; took out 2/3 of my colon" (08/22/2013)    SOCIAL HISTORY: Social History   Socioeconomic History  . Marital status: Married    Spouse name: Not on file  . Number of children: Not on file  . Years of education: Not on file  . Highest education level: Not on file  Occupational History  . Not on file  Tobacco Use  . Smoking status: Former Smoker    Packs/day: 0.25    Years: 70.00    Pack years: 17.50    Types: Cigarettes    Start date: 09/11/1944    Quit date: 02/17/2020    Years since quitting: 0.3  . Smokeless tobacco: Former Systems developer    Types: Snuff, Chew  Vaping Use  . Vaping Use: Never used  Substance and Sexual Activity  . Alcohol use: Not Currently    Alcohol/week: 0.0 standard drinks    Comment: 08/22/2013 "drink ~ 1 beer and wine/yr"  . Drug use: No  . Sexual activity: Never  Other Topics Concern  . Not on file  Social History Narrative   From [place]. Relocated to this area from [facility].   Social Determinants of Health   Financial Resource Strain:   . Difficulty of Paying Living Expenses: Not on file  Food Insecurity:   . Worried About Charity fundraiser in the Last Year: Not on file  . Ran Out of Food in the Last Year: Not on file  Transportation Needs:   . Lack of Transportation (Medical): Not on file  . Lack of Transportation (Non-Medical): Not on file  Physical Activity:   . Days of Exercise per Week: Not on file  . Minutes of  Exercise per Session: Not on file  Stress:   . Feeling of Stress : Not on file  Social Connections:   . Frequency of Communication with Friends and Family: Not on file  . Frequency of Social Gatherings with Friends and Family: Not on file  . Attends Religious Services: Not on file  . Active Member of Clubs or Organizations: Not on file  . Attends Archivist Meetings: Not on file  . Marital Status: Not on file  Intimate Partner Violence:   . Fear of Current or Ex-Partner: Not on file  . Emotionally Abused: Not on file  . Physically Abused: Not on file  . Sexually Abused: Not on file    FAMILY HISTORY: Family History  Problem Relation Age of Onset  . Cancer Mother        uterine deceased age 26  . Hypertension Sister   . Arrhythmia Other        Atrial fibrillation  . Hypertension Maternal Grandmother     ALLERGIES:  is allergic to morphine.  MEDICATIONS:  Current Outpatient Medications  Medication Sig Dispense Refill  . acetaminophen (TYLENOL) 500 MG tablet Take 1,000 mg by mouth every 6 (six) hours as needed for headache.    . albuterol (VENTOLIN HFA) 108 (90 Base) MCG/ACT inhaler Inhale 1-2 puffs into the lungs every 6 (six) hours as needed for wheezing or shortness of breath. (Patient taking differently: Inhale 2 puffs into the lungs every 6 (six) hours as needed for wheezing or shortness of breath. ) 18 g 2  . allopurinol (ZYLOPRIM) 100 MG tablet TAKE 1 TABLET BY MOUTH EVERY DAY (Patient taking differently: Take 100 mg by mouth daily. ) 90 tablet 3  . Alpha-D-Galactosidase (BEANO ULTRA 800) TABS Take 1,600 mg by mouth daily as needed (gas).    . betamethasone valerate ointment (VALISONE) 0.1 % Apply 1 application topically 2 (two) times daily as needed (psoriasis).     . Calcium Carbonate-Vitamin D (CALTRATE 600+D) 600-400 MG-UNIT per tablet Take 1 tablet by mouth daily.      . Carboxymethylcellulose Sodium (ARTIFICIAL TEARS OP) Place 1 drop into both eyes daily as  needed (dry eyes).    Marland Kitchen docusate sodium (COLACE) 100 MG capsule Take 100 mg by mouth at bedtime.     Marland Kitchen lisinopril (ZESTRIL) 10 MG tablet TAKE 1 TABLET BY MOUTH EVERY DAY 90 tablet 0  . memantine (NAMENDA) 5 MG tablet Take 1 tablet (5 mg total) by mouth 2 (two) times daily. (Patient taking differently: Take 2.5 mg by mouth 2 (two) times daily. ) 180  tablet 3  . metoprolol tartrate (LOPRESSOR) 25 MG tablet TAKE 1/2 TABLET (12.5 MG TOTAL) BY MOUTH 2 (TWO) TIMES DAILY. (Patient taking differently: Take 12.5 mg by mouth 2 (two) times daily. ) 90 tablet 1  . Multiple Vitamin (MULTIVITAMIN) tablet Take 1 tablet by mouth daily.    Marland Kitchen umeclidinium-vilanterol (ANORO ELLIPTA) 62.5-25 MCG/INH AEPB Inhale 1 puff into the lungs daily. (Patient taking differently: Inhale 1 puff into the lungs daily as needed (shortness of breath). ) 60 each 12  . XARELTO 15 MG TABS tablet TAKE 1 TABLET (15 MG TOTAL) BY MOUTH DAILY WITH SUPPER. 90 tablet 0   Current Facility-Administered Medications  Medication Dose Route Frequency Provider Last Rate Last Admin  . influenza vaccine adjuvanted (FLUAD) injection 0.5 mL  0.5 mL Intramuscular Once Derek Jack, MD        REVIEW OF SYSTEMS:   Review of Systems  Constitutional: Positive for fatigue (25%). Negative for appetite change.  HENT:   Positive for trouble swallowing.   Respiratory: Positive for cough (dry) and shortness of breath. Negative for hemoptysis.   Cardiovascular: Positive for palpitations.  Musculoskeletal: Positive for back pain.  Neurological: Positive for dizziness (upon getting up). Negative for extremity weakness.  Psychiatric/Behavioral: Positive for depression. The patient is nervous/anxious.   All other systems reviewed and are negative.    PHYSICAL EXAMINATION: ECOG PERFORMANCE STATUS: 1 - Symptomatic but completely ambulatory  Vitals:   06/27/20 0802  BP: (!) 152/96  Pulse: 90  Resp: 18  Temp: (!) 96.8 F (36 C)  SpO2: 97%   Filed  Weights   06/27/20 0802  Weight: 173 lb 3.2 oz (78.6 kg)   Physical Exam Vitals reviewed.  Constitutional:      Appearance: Normal appearance.  Cardiovascular:     Rate and Rhythm: Normal rate and regular rhythm.     Pulses: Normal pulses.     Heart sounds: Normal heart sounds.  Pulmonary:     Effort: Pulmonary effort is normal.     Breath sounds: Normal breath sounds.  Abdominal:     Palpations: Abdomen is soft. There is no hepatomegaly, splenomegaly or mass.     Tenderness: There is no abdominal tenderness.     Hernia: No hernia is present.  Musculoskeletal:     Right lower leg: No edema.     Left lower leg: No edema.  Lymphadenopathy:     Cervical: No cervical adenopathy.     Upper Body:     Right upper body: No supraclavicular adenopathy.     Left upper body: No supraclavicular adenopathy.  Neurological:     General: No focal deficit present.     Mental Status: He is alert and oriented to person, place, and time.  Psychiatric:        Mood and Affect: Mood normal.        Behavior: Behavior normal.      LABORATORY DATA:  I have reviewed the data as listed CBC Latest Ref Rng & Units 06/17/2020 08/29/2019 11/22/2018  WBC 4.0 - 10.5 K/uL 10.5 8.0 7.2  Hemoglobin 13.0 - 17.0 g/dL 16.0 16.5 14.8  Hematocrit 39 - 52 % 49.9 50.1 43.2  Platelets 150 - 400 K/uL 216 177 171   CMP Latest Ref Rng & Units 04/02/2020 08/29/2019 05/26/2019  Glucose 65 - 99 mg/dL 101(H) 96 104(H)  BUN 8 - 27 mg/dL 34(H) 24 19  Creatinine 0.76 - 1.27 mg/dL 1.95(H) 1.78(H) 1.69(H)  Sodium 134 - 144 mmol/L 145(H) 145(H)  142  Potassium 3.5 - 5.2 mmol/L 4.9 5.2 5.2  Chloride 96 - 106 mmol/L 106 104 101  CO2 20 - 29 mmol/L 24 23 27   Calcium 8.6 - 10.2 mg/dL 9.6 9.9 9.9  Total Protein 6.0 - 8.5 g/dL 6.7 - -  Total Bilirubin 0.0 - 1.2 mg/dL 0.3 - -  Alkaline Phos 48 - 121 IU/L 102 - -  AST 0 - 40 IU/L 17 - -  ALT 0 - 44 IU/L 10 - -   Lab Results  Component Value Date   PSA 1.5 04/02/2020   PSA  0.8 08/29/2019   PSA 0.7 03/09/2019   Surgical pathology [id number]) done on 06/17/2020: Left lung upper lobe needle core biopsy: squamous cell carcinoma.  RADIOGRAPHIC STUDIES: I have personally reviewed the radiological images as listed and agreed with the findings in the report. CT BIOPSY  Result Date: 06/17/2020 INDICATION: History of prostate cancer, now with indeterminate left upper lobe pulmonary nodule. Please perform CT-guided biopsy for tissue diagnostic purposes. EXAM: CT GUIDED LEFT UPPER LOBE PULMONARY NODULE BIOPSY COMPARISON:  [facility] PET-CT-05/15/2020 MEDICATIONS: None. ANESTHESIA/SEDATION: Fentanyl 25 mcg IV; Versed 0.5 mg IV Sedation time: 18 minutes; The patient was continuously monitored during the procedure by the interventional radiology nurse under my direct supervision. CONTRAST:  None COMPLICATIONS: None immediate. PROCEDURE: Informed consent was obtained from the patient following an explanation of the procedure, risks, benefits and alternatives. The patient understands,agrees and consents for the procedure. All questions were addressed. A time out was performed prior to the initiation of the procedure. The patient was positioned supine, slightly RPO on the CT table and a limited chest CT was performed for procedural planning demonstrating unchanged size and appearance of the somewhat macrolobulated approximately 1.2 x 1.0 cm nodule within the left upper lobe (image 17, series 2). The operative site was prepped and draped in the usual sterile fashion. Under sterile conditions and local anesthesia, a 17 gauge coaxial needle was advanced into the peripheral aspect of the nodule. Positioning was confirmed with intermittent CT fluoroscopy and followed by the acquisition of 3 with an 18 gauge core needle biopsy device. The coaxial needle was removed following deployment of a Biosentry plug and superficial hemostasis was achieved with manual compression. Limited post procedural chest  CT was negative for pneumothorax or additional complication. A dressing was placed. The patient tolerated the procedure well without immediate postprocedural complication. The patient was escorted to have an upright chest radiograph. IMPRESSION: Technically successful CT guided core needle core biopsy of indeterminate left upper lobe pulmonary nodule. Electronically Signed   By: Sandi Mariscal M.D.   On: 06/17/2020 13:52   DG Chest Port 1 View  Result Date: 06/17/2020 CLINICAL DATA:  Post left lung nodule biopsy, now with postprocedural chest pain. EXAM: PORTABLE CHEST 1 VIEW COMPARISON:  05/13/2017; CT guided left lung nodule biopsy-earlier same day; PET-CT-05/14/2020 FINDINGS: Grossly unchanged cardiac silhouette and mediastinal contours. Redemonstrated known ill-defined nodule within the peripheral aspect of the left mid lung. No definite pneumothorax following recent CT-guided biopsy. No discrete focal airspace opacities. No pleural effusion or pneumothorax. Atherosclerotic plaque within the thoracic aorta. No acute osseous abnormalities. Surgical clips overlie the midline of the upper abdomen. No acute osseous abnormalities. Old/healed right posterolateral rib fractures. IMPRESSION: No definite complication following left-sided CT-guided lung nodule biopsy. Specifically, no definite pneumothorax. Electronically Signed   By: Sandi Mariscal M.D.   On: 06/17/2020 13:07    ASSESSMENT:  1.  Left upper lobe squamous cell lung  carcinoma: -1 pack/day for 75 years, quit in June 2021. -Incidental finding of left upper lobe lung nodule on Ax[facility] PET scan done for prostate cancer. -CT biopsy on 06/18/2019 one of the left upper lobe lung nodule consistent with squamous cell carcinoma, IHC p40 positive.  2.  Prostate cancer: -Prostate cancer, Gleason 9 diagnosed in April 2010 -Status post seed placement and XRT completed on 08/16/2009 in [place]. -PSA recurrence and was started on Lupron in 2017 after PSA rose to 9.9 in  6/17 from 3.6 in 12/16. -PSA nadired at 0.2 on Lupron in 9/18 but has been rising, 0.4 in 3/20, 0.8 in 12/20 and 1.5 on 04/02/2020.  Testosterone levels remained castrate. -Axumin PET scan on 05/14/2020 shows hypermetabolic left upper lobe lung nodule.  Radiation seeds in the prostate with diffuse hypermetabolic him, indeterminate.  Smaller right upper lobe lung nodule is below PET resolution.  No skeletal metastasis.   PLAN:  1.  Left upper lobe squamous cell lung carcinoma: -He was evaluated by Dr. Roxan Hockey and was felt not a candidate for surgical resection. -He was recommended SBRT. -I have recommended getting a CT scan of the chest for optimal visualization of other smaller nodules.  However because of his CKD, he cannot receive IV contrast. -We will order FDG PET CT scan. -I will make a referral to radiation oncology at [facility] after the PET scan.  2.  M0 castrate resistant prostate cancer: -Last Lupron injection on 04/05/2020. -He has follow-up with Dr. Roni Bread for consideration of 2nd line therapy in the next few weeks.  3.  CKD: -His creatinine ranges between 1.5-2.0.   All questions were answered. The patient knows to call the clinic with any problems, questions or concerns.   Derek Jack, MD, 06/27/20 8:41 AM  [place] [phone]   I, Milinda Antis, am acting as a scribe for Dr. Sanda Linger.  I, Derek Jack MD, have reviewed the above documentation for accuracy and completeness, and I agree with the above.

## 2020-06-27 NOTE — Progress Notes (Signed)
The proposed treatment discussed in cancer conference 06/27/20 is for discussion purpose only and is not a binding recommendation.  The patient was not physically examined nor present for their treatment options.  Therefore, final treatment plans cannot be decided.

## 2020-06-27 NOTE — Patient Instructions (Signed)
[place] at [facility] Discharge Instructions  You were seen and examined today by Dr. Delton Coombes. Dr. Delton Coombes is a medical oncologist, meaning he specializes in the medical management of cancer. Dr. Delton Coombes discussed your current health status and the events that led to you being here today.   Dr. Roxan Hockey referred you to the [facility]. You have been diagnosed with Stage I Lung Cancer, this is not related to your Prostate Cancer. Your lung cancer is in a very early stage.   You had a prostate specific PET scan, Dr. Delton Coombes will arrange for you to have a general PET scan to better observe the nodule present on your lung.  You will return to the clinic following the PET scan to discuss those results.   Thank you for choosing [place] at [facility] to provide your oncology and hematology care.  To afford each patient quality time with our provider, please arrive at least 15 minutes before your scheduled appointment time.   If you have a lab appointment with the [facility] please come in thru the Main Entrance and check in at the main information desk.  You need to re-schedule your appointment should you arrive 10 or more minutes late.  We strive to give you quality time with our providers, and arriving late affects you and other patients whose appointments are after yours.  Also, if you no show three or more times for appointments you may be dismissed from the clinic at the providers discretion.     Again, thank you for choosing [facility].  Our hope is that these requests will decrease the amount of time that you wait before being seen by our physicians.       _____________________________________________________________  Should you have questions after your visit to [facility], please contact our office at [phone] and follow the prompts.  Our office hours are 8:00 a.m. and 4:30 p.m. Monday -  Friday.  Please note that voicemails left after 4:00 p.m. may not be returned until the following business day.  We are closed weekends and major holidays.  You do have access to a nurse 24-7, just call the main number to the clinic [phone] and do not press any options, hold on the line and a nurse will answer the phone.    For prescription refill requests, have your pharmacy contact our office and allow 72 hours.    Due to Covid, you will need to wear a mask upon entering the hospital. If you do not have a mask, a mask will be given to you at the Main Entrance upon arrival. For doctor visits, patients may have 1 support person age 95 or older with them. For treatment visits, patients can not have anyone with them due to social distancing guidelines and our immunocompromised population.

## 2020-07-08 ENCOUNTER — Other Ambulatory Visit: Payer: Self-pay

## 2020-07-08 ENCOUNTER — Ambulatory Visit (HOSPITAL_COMMUNITY)
Admission: RE | Admit: 2020-07-08 | Discharge: 2020-07-08 | Disposition: A | Discharge status: Home / Self Care | Payer: Medicare Other | Source: Ambulatory Visit | Attending: Hematology | Admitting: Hematology

## 2020-07-08 DIAGNOSIS — C3492 Malignant neoplasm of unspecified part of left bronchus or lung: Secondary | ICD-10-CM | POA: Diagnosis not present

## 2020-07-08 DIAGNOSIS — R911 Solitary pulmonary nodule: Secondary | ICD-10-CM | POA: Diagnosis not present

## 2020-07-08 MED ORDER — FLUDEOXYGLUCOSE F - 18 (FDG) INJECTION
12.7000 | Freq: Once | INTRAVENOUS | Status: AC | PRN
  Administered 2020-07-08: 12.7 via INTRAVENOUS

## 2020-07-09 ENCOUNTER — Encounter: Payer: Self-pay | Admitting: Family Medicine

## 2020-07-09 ENCOUNTER — Ambulatory Visit (INDEPENDENT_AMBULATORY_CARE_PROVIDER_SITE_OTHER): Payer: Medicare Other | Admitting: Family Medicine

## 2020-07-09 VITALS — BP 130/77 | HR 79 | Temp 96.6°F | Ht 70.0 in | Wt 177.6 lb

## 2020-07-09 DIAGNOSIS — C3492 Malignant neoplasm of unspecified part of left bronchus or lung: Secondary | ICD-10-CM

## 2020-07-09 DIAGNOSIS — C61 Malignant neoplasm of prostate: Secondary | ICD-10-CM

## 2020-07-09 DIAGNOSIS — L409 Psoriasis, unspecified: Secondary | ICD-10-CM | POA: Diagnosis not present

## 2020-07-09 DIAGNOSIS — F0150 Vascular dementia without behavioral disturbance: Secondary | ICD-10-CM

## 2020-07-09 DIAGNOSIS — J449 Chronic obstructive pulmonary disease, unspecified: Secondary | ICD-10-CM

## 2020-07-09 MED ORDER — BETAMETHASONE VALERATE 0.1 % EX OINT: 1.0000 "application " | TOPICAL_OINTMENT | Freq: Two times a day (BID) | CUTANEOUS | 1 refills | Status: DC

## 2020-07-09 NOTE — Progress Notes (Signed)
[place], [place] [address]   CLINIC:  Medical Oncology/Hematology  PCP:  Janora Norlander, DO [address] [place] [address] [phone]   REASON FOR VISIT:  Follow-up for left squamous cell lung cancer  PRIOR THERAPY: None  NGS Results: Not done  CURRENT THERAPY: Under work-up  BRIEF ONCOLOGIC HISTORY:  Oncology History   No history exists.    CANCER STAGING: Cancer Staging No matching staging information was found for the patient.  INTERVAL HISTORY:  Erik Mullins, a 86 y.o. male, returns for routine follow-up of his left squamous cell lung cancer. Erik Mullins was last seen on 06/27/2020.   Today Erik Mullins reports that Erik Mullins has been feeling fairly stable.  Erik Mullins has chronic trouble swallowing pills.  Appetite is 100%.  Energy levels are 25%.  Denies any cough or hemoptysis.  REVIEW OF SYSTEMS:  Review of Systems  HENT:   Positive for trouble swallowing.   Psychiatric/Behavioral: Positive for depression.  All other systems reviewed and are negative.   PAST MEDICAL/SURGICAL HISTORY:  Past Medical History:  Diagnosis Date  . AAA (abdominal aortic aneurysm) ([facility])    Remote ~1994   . Anemia   . Arthritis   . Atrial fibrillation [facility])    Diagnosed December 2014  . AVNRT (AV nodal re-entry tachycardia) ([place])    Possible  . Cataract    Bilateral  . Cirrhosis ([place])   . CKD (chronic kidney disease) stage 3, GFR 30-59 ml/min (H[facility])   . Essential hypertension   . Gout   . History of GI bleed   . History of gout   . History of hepatitis   . History of stroke   . Hyperlipidemia   . PAD (peripheral artery disease) (H[facility])    Stenting in lower extremities (no records)  . Prostate cancer ([place])    XRT; in remission  . Type 2 diabetes mellitus ([place])    Past Surgical History:  Procedure Laterality Date  . ABDOMINAL AORTIC ANEURYSM REPAIR  1980's   in [place]  . APPENDECTOMY    . CAROTID ENDARTERECTOMY Right ~ 1999  .  CATARACT EXTRACTION W/ INTRAOCULAR LENS  IMPLANT, BILATERAL Bilateral 2014  . CHOLECYSTECTOMY    . COLON SURGERY    . PARTIAL COLECTOMY     "I was bleeding to death inside; took out 2/3 of my colon" (08/22/2013)    SOCIAL HISTORY:  Social History   Socioeconomic History  . Marital status: Married    Spouse name: Not on file  . Number of children: Not on file  . Years of education: Not on file  . Highest education level: Not on file  Occupational History  . Not on file  Tobacco Use  . Smoking status: Former Smoker    Packs/day: 0.25    Years: 70.00    Pack years: 17.50    Types: Cigarettes    Start date: 09/11/1944    Quit date: 02/17/2020    Years since quitting: 0.3  . Smokeless tobacco: Former Systems developer    Types: Snuff, Chew  Vaping Use  . Vaping Use: Never used  Substance and Sexual Activity  . Alcohol use: Not Currently    Alcohol/week: 0.0 standard drinks    Comment: 08/22/2013 "drink ~ 1 beer and wine/yr"  . Drug use: No  . Sexual activity: Never  Other Topics Concern  . Not on file  Social History Narrative   From [place]. Relocated to  this area from [facility].   Social Determinants of Health   Financial Resource Strain:   . Difficulty of Paying Living Expenses: Not on file  Food Insecurity:   . Worried About Charity fundraiser in the Last Year: Not on file  . Ran Out of Food in the Last Year: Not on file  Transportation Needs:   . Lack of Transportation (Medical): Not on file  . Lack of Transportation (Non-Medical): Not on file  Physical Activity:   . Days of Exercise per Week: Not on file  . Minutes of Exercise per Session: Not on file  Stress:   . Feeling of Stress : Not on file  Social Connections:   . Frequency of Communication with Friends and Family: Not on file  . Frequency of Social Gatherings with Friends and Family: Not on file  . Attends Religious Services: Not on file  . Active Member of Clubs or Organizations: Not on file  . Attends  Archivist Meetings: Not on file  . Marital Status: Not on file  Intimate Partner Violence:   . Fear of Current or Ex-Partner: Not on file  . Emotionally Abused: Not on file  . Physically Abused: Not on file  . Sexually Abused: Not on file    FAMILY HISTORY:  Family History  Problem Relation Age of Onset  . Cancer Mother        uterine deceased age 54  . Hypertension Sister   . Arrhythmia Other        Atrial fibrillation  . Hypertension Maternal Grandmother     CURRENT MEDICATIONS:  Current Outpatient Medications  Medication Sig Dispense Refill  . acetaminophen (TYLENOL) 500 MG tablet Take 1,000 mg by mouth every 6 (six) hours as needed for headache.    . albuterol (VENTOLIN HFA) 108 (90 Base) MCG/ACT inhaler Inhale 1-2 puffs into the lungs every 6 (six) hours as needed for wheezing or shortness of breath. (Patient taking differently: Inhale 2 puffs into the lungs every 6 (six) hours as needed for wheezing or shortness of breath. ) 18 g 2  . allopurinol (ZYLOPRIM) 100 MG tablet TAKE 1 TABLET BY MOUTH EVERY DAY (Patient taking differently: Take 100 mg by mouth daily. ) 90 tablet 3  . Alpha-D-Galactosidase (BEANO ULTRA 800) TABS Take 1,600 mg by mouth daily as needed (gas).    . betamethasone valerate ointment (VALISONE) 0.1 % Apply 1 application topically 2 (two) times daily. Avoid face, axilla and groin. (use as needed for psoriasis flares) 90 g 1  . Calcium Carbonate-Vitamin D (CALTRATE 600+D) 600-400 MG-UNIT per tablet Take 1 tablet by mouth daily.      . Carboxymethylcellulose Sodium (ARTIFICIAL TEARS OP) Place 1 drop into both eyes daily as needed (dry eyes).    Marland Kitchen docusate sodium (COLACE) 100 MG capsule Take 100 mg by mouth at bedtime.     Marland Kitchen lisinopril (ZESTRIL) 10 MG tablet TAKE 1 TABLET BY MOUTH EVERY DAY 90 tablet 0  . memantine (NAMENDA) 5 MG tablet Take 1 tablet (5 mg total) by mouth 2 (two) times daily. (Patient taking differently: Take 2.5 mg by mouth 2 (two)  times daily. ) 180 tablet 3  . metoprolol tartrate (LOPRESSOR) 25 MG tablet TAKE 1/2 TABLET (12.5 MG TOTAL) BY MOUTH 2 (TWO) TIMES DAILY. (Patient taking differently: Take 12.5 mg by mouth 2 (two) times daily. ) 90 tablet 1  . Multiple Vitamin (MULTIVITAMIN) tablet Take 1 tablet by mouth daily.    Marland Kitchen  umeclidinium-vilanterol (ANORO ELLIPTA) 62.5-25 MCG/INH AEPB Inhale 1 puff into the lungs daily. (Patient taking differently: Inhale 1 puff into the lungs daily as needed (shortness of breath). ) 60 each 12  . XARELTO 15 MG TABS tablet TAKE 1 TABLET (15 MG TOTAL) BY MOUTH DAILY WITH SUPPER. 90 tablet 0   No current facility-administered medications for this visit.    ALLERGIES:  Allergies  Allergen Reactions  . Morphine Other (See Comments)    hallucinate    PHYSICAL EXAM:  Performance status (ECOG): 1 - Symptomatic but completely ambulatory  There were no vitals filed for this visit. Wt Readings from Last 3 Encounters:  07/09/20 177 lb 9.6 oz (80.6 kg)  06/27/20 173 lb 3.2 oz (78.6 kg)  06/20/20 173 lb (78.5 kg)   Physical Exam Vitals reviewed.  Constitutional:      Appearance: Normal appearance.  Cardiovascular:     Rate and Rhythm: Normal rate and regular rhythm.     Heart sounds: Normal heart sounds.  Pulmonary:     Effort: Pulmonary effort is normal.     Breath sounds: Normal breath sounds.  Neurological:     Mental Status: Erik Mullins is alert and oriented to person, place, and time.  Psychiatric:        Mood and Affect: Mood normal.        Behavior: Behavior normal.      LABORATORY DATA:  I have reviewed the labs as listed.  CBC Latest Ref Rng & Units 06/17/2020 08/29/2019 11/22/2018  WBC 4.0 - 10.5 K/uL 10.5 8.0 7.2  Hemoglobin 13.0 - 17.0 g/dL 16.0 16.5 14.8  Hematocrit 39 - 52 % 49.9 50.1 43.2  Platelets 150 - 400 K/uL 216 177 171   CMP Latest Ref Rng & Units 04/02/2020 08/29/2019 05/26/2019  Glucose 65 - 99 mg/dL 101(H) 96 104(H)  BUN 8 - 27 mg/dL 34(H) 24 19    Creatinine 0.76 - 1.27 mg/dL 1.95(H) 1.78(H) 1.69(H)  Sodium 134 - 144 mmol/L 145(H) 145(H) 142  Potassium 3.5 - 5.2 mmol/L 4.9 5.2 5.2  Chloride 96 - 106 mmol/L 106 104 101  CO2 20 - 29 mmol/L 24 23 27   Calcium 8.6 - 10.2 mg/dL 9.6 9.9 9.9  Total Protein 6.0 - 8.5 g/dL 6.7 - -  Total Bilirubin 0.0 - 1.2 mg/dL 0.3 - -  Alkaline Phos 48 - 121 IU/L 102 - -  AST 0 - 40 IU/L 17 - -  ALT 0 - 44 IU/L 10 - -    DIAGNOSTIC IMAGING:  I have independently reviewed the scans and discussed with the patient. NM PET Image Initial (PI) Skull Base To Thigh  Result Date: 07/09/2020 CLINICAL DATA:  Initial treatment strategy for left lung nodule. History of prostate cancer status post radiation. EXAM: NUCLEAR MEDICINE PET SKULL BASE TO THIGH TECHNIQUE: 12.7 mCi F-18 FDG was injected intravenously. Full-ring PET imaging was performed from the skull base to thigh after the radiotracer. CT data was obtained and used for attenuation correction and anatomic localization. Fasting blood glucose: 98 mg/dl COMPARISON:  [facility] PET-CT dated 05/14/2020 FINDINGS: Mediastinal blood pool activity: SUV max 2.7 Liver activity: SUV max NA NECK: No hypermetabolic cervical lymphadenopathy. Incidental CT findings: none CHEST: 13 mm left upper lobe nodule (series 3/image 109), max SUV 10.0. This previously measured 10 mm. No hypermetabolic thoracic lymphadenopathy. Incidental CT findings: Atherosclerotic calcifications of the aortic arch. Three vessel coronary atherosclerosis. ABDOMEN/PELVIS: No abnormal hypermetabolism in the liver, spleen, pancreas, or adrenal glands. No hypermetabolic abdominopelvic  lymphadenopathy. Radiation fiducial markers along the prostate. Focal hypermetabolism along the right prostate, max SUV 6.2, nonspecific. Absence of hypermetabolism in this region on prior [facility] PET favors inflammation over neoplasm. Incidental CT findings: Status post cholecystectomy. Numerous left renal cysts of varying sizes and  complexities. Aorta bi-iliac graft with stable aneurysm dilatation along the left external iliac (series 3/image 258). Atherosclerotic calcifications. Sigmoid diverticulosis. Mildly thick-walled bladder, although underdistended. Tiny fat containing bilateral inguinal hernias SKELETON: No focal hypermetabolic activity to suggest skeletal metastasis. Incidental CT findings: Degenerative changes of the visualized thoracolumbar spine. IMPRESSION: 13 mm left upper lobe nodule, minimally progressive, corresponding to the patient's known primary bronchogenic neoplasm. No findings suspicious for metastatic disease. Focal hypermetabolism along the right prostate is nonspecific and favors inflammation over neoplasm. Electronically Signed   By: Julian Hy M.D.   On: 07/09/2020 10:44   CT BIOPSY  Result Date: 06/17/2020 INDICATION: History of prostate cancer, now with indeterminate left upper lobe pulmonary nodule. Please perform CT-guided biopsy for tissue diagnostic purposes. EXAM: CT GUIDED LEFT UPPER LOBE PULMONARY NODULE BIOPSY COMPARISON:  [facility] PET-CT-05/15/2020 MEDICATIONS: None. ANESTHESIA/SEDATION: Fentanyl 25 mcg IV; Versed 0.5 mg IV Sedation time: 18 minutes; The patient was continuously monitored during the procedure by the interventional radiology nurse under my direct supervision. CONTRAST:  None COMPLICATIONS: None immediate. PROCEDURE: Informed consent was obtained from the patient following an explanation of the procedure, risks, benefits and alternatives. The patient understands,agrees and consents for the procedure. All questions were addressed. A time out was performed prior to the initiation of the procedure. The patient was positioned supine, slightly RPO on the CT table and a limited chest CT was performed for procedural planning demonstrating unchanged size and appearance of the somewhat macrolobulated approximately 1.2 x 1.0 cm nodule within the left upper lobe (image 17, series 2). The  operative site was prepped and draped in the usual sterile fashion. Under sterile conditions and local anesthesia, a 17 gauge coaxial needle was advanced into the peripheral aspect of the nodule. Positioning was confirmed with intermittent CT fluoroscopy and followed by the acquisition of 3 with an 18 gauge core needle biopsy device. The coaxial needle was removed following deployment of a Biosentry plug and superficial hemostasis was achieved with manual compression. Limited post procedural chest CT was negative for pneumothorax or additional complication. A dressing was placed. The patient tolerated the procedure well without immediate postprocedural complication. The patient was escorted to have an upright chest radiograph. IMPRESSION: Technically successful CT guided core needle core biopsy of indeterminate left upper lobe pulmonary nodule. Electronically Signed   By: Sandi Mariscal M.D.   On: 06/17/2020 13:52   DG Chest Port 1 View  Result Date: 06/17/2020 CLINICAL DATA:  Post left lung nodule biopsy, now with postprocedural chest pain. EXAM: PORTABLE CHEST 1 VIEW COMPARISON:  05/13/2017; CT guided left lung nodule biopsy-earlier same day; PET-CT-05/14/2020 FINDINGS: Grossly unchanged cardiac silhouette and mediastinal contours. Redemonstrated known ill-defined nodule within the peripheral aspect of the left mid lung. No definite pneumothorax following recent CT-guided biopsy. No discrete focal airspace opacities. No pleural effusion or pneumothorax. Atherosclerotic plaque within the thoracic aorta. No acute osseous abnormalities. Surgical clips overlie the midline of the upper abdomen. No acute osseous abnormalities. Old/healed right posterolateral rib fractures. IMPRESSION: No definite complication following left-sided CT-guided lung nodule biopsy. Specifically, no definite pneumothorax. Electronically Signed   By: Sandi Mariscal M.D.   On: 06/17/2020 13:07     ASSESSMENT:  1.  Left upper lobe squamous  cell  lung carcinoma: -1 pack/day for 75 years, quit in June 2021. -Incidental finding of left upper lobe lung nodule on [facility] PET scan done for prostate cancer. -CT biopsy on 06/18/2019 one of the left upper lobe lung nodule consistent with squamous cell carcinoma, IHC p40 positive. -PET CT scan on 07/08/2020 shows 30 mm left upper lobe nodule, SUV 10.  No hypermetabolic adenopathy.  Hypermetabolic focally in the right prostate is nonspecific.  2.  Prostate cancer: -Prostate cancer, Gleason 9 diagnosed in April 2010 -Status post seed placement and XRT completed on 08/16/2009 in [place]. -PSA recurrence and was started on Lupron in 2017 after PSA rose to 9.9 in 6/17 from 3.6 in 12/16. -PSA nadired at 0.2 on Lupron in 9/18 but has been rising, 0.4 in 3/20, 0.8 in 12/20 and 1.5 on 04/02/2020.  Testosterone levels remained castrate. -[facility] PET scan on 05/14/2020 shows hypermetabolic left upper lobe lung nodule.  Radiation seeds in the prostate with diffuse hypermetabolic him, indeterminate.  Smaller right upper lobe lung nodule is below PET resolution.  No skeletal metastasis.   PLAN:  1.  Left upper lobe squamous cell lung carcinoma: -I have reviewed PET CT scan results. -Erik Mullins was already evaluated by Dr. Roxan Hockey and felt to be not a candidate for surgical resection. -I will make a referral to radiation oncology for SBRT. -RTC 4 to 6 months with repeat CT chest.  2.  M0 castrate resistant prostate cancer: -Last Lupron injection on 04/05/2020.  Follow-up with Dr. Roni Bread for consideration of second line therapy in the next few weeks.  3.  CKD: -Baseline creatinine between 1.5-2.0.   Orders placed this encounter:  No orders of the defined types were placed in this encounter.    Derek Jack, MD [place] [phone]   I, Milinda Antis, am acting as a scribe for Dr. Sanda Linger.  I, Derek Jack MD, have reviewed the above documentation for accuracy and  completeness, and I agree with the above.

## 2020-07-09 NOTE — Patient Instructions (Signed)
Basic Skin Care Your skin plays an important role in keeping the entire body healthy.  Below are some tips on how to try and maximize skin health from the outside in.  1) Bathe in mildly warm water every 1 to 3 days, followed by light drying and an application of a thick moisturizer cream or ointment, preferably one that comes in a tub. a. Fragrance free moisturizing bars or body washes are preferred such as Purpose, Cetaphil, Dove sensitive skin, Aveeno, [employer] or Vanicream products. b. Use a fragrance free cream or ointment, not a lotion, such as plain petroleum jelly or Vaseline ointment, Aquaphor, Vanicream, Eucerin cream or a generic version, CeraVe Cream, Cetaphil Restoraderm, Aveeno Eczema Therapy and [employer], among others. c. Children with very dry skin often need to put on these creams two, three or four times a day.  As much as possible, use these creams enough to keep the skin from looking dry. d. Consider using fragrance free/dye free detergent, such as Arm and Hammer for sensitive skin, Tide Free or All Free.   2) If I am prescribing a medication to go on the skin, the medicine goes on first to the areas that need it, followed by a thick cream as above to the entire body.  3) Nancy Fetter is a major cause of damage to the skin. a. I recommend sun protection for all of my patients. I prefer physical barriers such as hats with wide brims that cover the ears, long sleeve clothing with SPF protection including rash guards for swimming. These can be found seasonally at outdoor clothing companies, [employer] and [employer] and online at [employer].com, www.uvskinz.com and PlayDetails.hu. Avoid peak sun between the hours of 10am to 3pm to minimize sun exposure.  b. I recommend sunscreen for all of my patients older than 39 months of age when in the sun, preferably with broad spectrum coverage and SPF 30 or higher.  i. For children, I recommend sunscreens that only contain  titanium dioxide and/or zinc oxide in the active ingredients. These do not burn the eyes and appear to be safer than chemical sunscreens. These sunscreens include zinc oxide paste found in the diaper section, Vanicream Broad Spectrum 50+, Aveeno Natural Mineral Protection, Neutrogena Pure and Free Baby, Johnson and [employer] Daily face and body lotion, Bed Bath & Beyond, among others. ii. There is no such thing as waterproof sunscreen. All sunscreens should be reapplied after 60-80 minutes of wear.  iii. Spray on sunscreens often use chemical sunscreens which do protect against the sun. However, these can be difficult to apply correctly, especially if wind is present, and can be more likely to irritate the skin.  Long term effects of chemical sunscreens are also not fully known.

## 2020-07-09 NOTE — Progress Notes (Signed)
Subjective: CC: f/u memory PCP: Erik Norlander, DO VEH:[facility] Erik Mullins is a 86 y.o. male, who is accompanied today's visit by [name].  He is presenting to clinic today for:  1. Shortness of breath He reports having been recently diagnosed with a left-sided lung cancer.  He just had a PET scan last night and they will be following up with Dr. Delton Coombes for review of this PET scan.  They think that this is a primary lung cancer and not a metastases from his prostate cancer, which she is also still under the care of Dr. Jeffie Pollock for.  He has not been compliant with the Anoro per his wife's report.  He has concerns that he may be desaturating at nighttime.  He just feels "short of breath in the morning".  She does not report any observed apneas.  They are not using any pulse oxes to measure oxygen at nighttime.  No change in exercise tolerance but she notes that he is quite sedentary  2.  Atrial fibrillation Patient is compliant with metoprolol and Xarelto.  No hematochezia, melena, hematuria or epistaxis  3.  Psoriasis Patient reports intermittent compliance with the betamethasone ointment that is prescribed by the [place].  She notes that the just seems insufficient to cover all the lesions that he has.  He has couple on the left forearm and 1 on the left posterior ankle.  He does not have any structured skin regimen and does not moisturize  4.  Vascular dementia Patient is compliant with his Namenda.  She does not report any abrupt changes in memory or mood  ROS: Per HPI  Allergies  Allergen Reactions  . Morphine Other (See Comments)    hallucinate   Past Medical History:  Diagnosis Date  . AAA (abdominal aortic aneurysm) ([place])    Remote ~1994   . Anemia   . Arthritis   . Atrial fibrillation [facility])    Diagnosed December 2014  . AVNRT (AV nodal re-entry tachycardia) ([place])    Possible  . Cataract    Bilateral  . Cirrhosis ([place])   . CKD (chronic kidney disease) stage 3, GFR  30-59 ml/min (HCC)   . Essential hypertension   . Gout   . History of GI bleed   . History of gout   . History of hepatitis   . History of stroke   . Hyperlipidemia   . PAD (peripheral artery disease) (HCC)    Stenting in lower extremities (no records)  . Prostate cancer ([place])    XRT; in remission  . Type 2 diabetes mellitus (HCC)     Current Outpatient Medications:  .  acetaminophen (TYLENOL) 500 MG tablet, Take 1,000 mg by mouth every 6 (six) hours as needed for headache., Disp: , Rfl:  .  albuterol (VENTOLIN HFA) 108 (90 Base) MCG/ACT inhaler, Inhale 1-2 puffs into the lungs every 6 (six) hours as needed for wheezing or shortness of breath. (Patient taking differently: Inhale 2 puffs into the lungs every 6 (six) hours as needed for wheezing or shortness of breath. ), Disp: 18 g, Rfl: 2 .  allopurinol (ZYLOPRIM) 100 MG tablet, TAKE 1 TABLET BY MOUTH EVERY DAY (Patient taking differently: Take 100 mg by mouth daily. ), Disp: 90 tablet, Rfl: 3 .  Alpha-D-Galactosidase (BEANO ULTRA 800) TABS, Take 1,600 mg by mouth daily as needed (gas)., Disp: , Rfl:  .  betamethasone valerate ointment (VALISONE) 0.1 %, Apply 1 application topically 2 (two) times daily as  needed (psoriasis). , Disp: , Rfl:  .  Calcium Carbonate-Vitamin D (CALTRATE 600+D) 600-400 MG-UNIT per tablet, Take 1 tablet by mouth daily.  , Disp: , Rfl:  .  Carboxymethylcellulose Sodium (ARTIFICIAL TEARS OP), Place 1 drop into both eyes daily as needed (dry eyes)., Disp: , Rfl:  .  docusate sodium (COLACE) 100 MG capsule, Take 100 mg by mouth at bedtime. , Disp: , Rfl:  .  lisinopril (ZESTRIL) 10 MG tablet, TAKE 1 TABLET BY MOUTH EVERY DAY, Disp: 90 tablet, Rfl: 0 .  memantine (NAMENDA) 5 MG tablet, Take 1 tablet (5 mg total) by mouth 2 (two) times daily. (Patient taking differently: Take 2.5 mg by mouth 2 (two) times daily. ), Disp: 180 tablet, Rfl: 3 .  metoprolol tartrate (LOPRESSOR) 25 MG tablet, TAKE 1/2 TABLET (12.5 MG TOTAL)  BY MOUTH 2 (TWO) TIMES DAILY. (Patient taking differently: Take 12.5 mg by mouth 2 (two) times daily. ), Disp: 90 tablet, Rfl: 1 .  Multiple Vitamin (MULTIVITAMIN) tablet, Take 1 tablet by mouth daily., Disp: , Rfl:  .  umeclidinium-vilanterol (ANORO ELLIPTA) 62.5-25 MCG/INH AEPB, Inhale 1 puff into the lungs daily. (Patient taking differently: Inhale 1 puff into the lungs daily as needed (shortness of breath). ), Disp: 60 each, Rfl: 12 .  XARELTO 15 MG TABS tablet, TAKE 1 TABLET (15 MG TOTAL) BY MOUTH DAILY WITH SUPPER., Disp: 90 tablet, Rfl: 0 Social History   Socioeconomic History  . Marital status: Married    Spouse name: Not on file  . Number of children: Not on file  . Years of education: Not on file  . Highest education level: Not on file  Occupational History  . Not on file  Tobacco Use  . Smoking status: Former Smoker    Packs/day: 0.25    Years: 70.00    Pack years: 17.50    Types: Cigarettes    Start date: 09/11/1944    Quit date: 02/17/2020    Years since quitting: 0.3  . Smokeless tobacco: Former Systems developer    Types: Snuff, Chew  Vaping Use  . Vaping Use: Never used  Substance and Sexual Activity  . Alcohol use: Not Currently    Alcohol/week: 0.0 standard drinks    Comment: 08/22/2013 "drink ~ 1 beer and wine/yr"  . Drug use: No  . Sexual activity: Never  Other Topics Concern  . Not on file  Social History Narrative   From [place]. Relocated to this area from [facility].   Social Determinants of Health   Financial Resource Strain:   . Difficulty of Paying Living Expenses: Not on file  Food Insecurity:   . Worried About Charity fundraiser in the Last Year: Not on file  . Ran Out of Food in the Last Year: Not on file  Transportation Needs:   . Lack of Transportation (Medical): Not on file  . Lack of Transportation (Non-Medical): Not on file  Physical Activity:   . Days of Exercise per Week: Not on file  . Minutes of Exercise per Session: Not on file   Stress:   . Feeling of Stress : Not on file  Social Connections:   . Frequency of Communication with Friends and Family: Not on file  . Frequency of Social Gatherings with Friends and Family: Not on file  . Attends Religious Services: Not on file  . Active Member of Clubs or Organizations: Not on file  . Attends Archivist Meetings: Not on file  . Marital  Status: Not on file  Intimate Partner Violence:   . Fear of Current or Ex-Partner: Not on file  . Emotionally Abused: Not on file  . Physically Abused: Not on file  . Sexually Abused: Not on file   Family History  Problem Relation Age of Onset  . Cancer Mother        uterine deceased age 31  . Hypertension Sister   . Arrhythmia Other        Atrial fibrillation  . Hypertension Maternal Grandmother     Objective: Office vital signs reviewed. BP 130/77   Pulse 79   Temp (!) 96.6 F (35.9 C)   Ht 5\' 10"  (1.778 m)   Wt 177 lb 9.6 oz (80.6 kg)   SpO2 95%   BMI 25.48 kg/m   Physical Examination:  General: Awake, alert, well appearing male, No acute distress HEENT: Normal, sclera white, MMM Cardio: regular rate and rhythm, S1S2 heard, no murmurs appreciated Pulm: Globally decreased breath sounds but seemingly clear to auscultation bilaterally.  No wheezes, rhonchi or rales.  He has normal work of breathing on room air. Extremities: warm, well perfused, No edema, cyanosis or clubbing; +2 pulses bilaterally Skin: Psoriatic plaques noted along the left dorsal hand and left ventral wrist.  Depression screen [facility] 2/9 07/09/2020 07/09/2020 02/06/2020  Decreased Interest 0 0 0  Down, Depressed, Hopeless 0 0 0  PHQ - 2 Score 0 0 0  Altered sleeping - - -  Tired, decreased energy - - -  Change in appetite - - -  Feeling bad or failure about yourself  - - -  Trouble concentrating - - -  Moving slowly or fidgety/restless - - -  Suicidal thoughts - - -  PHQ-9 Score - - -  Some recent data might be hidden      Assessment/ Plan: 86 y.o. male   Vascular dementia without behavioral disturbance ([place]) - Plan: Renal Function Panel, Hepatic Function Panel  Chronic obstructive pulmonary disease, unspecified COPD type ([place]) - Plan: CBC with Differential  Squamous cell lung cancer, left ([place]) - Plan: CBC with Differential  Prostate cancer ([place]) - Plan: CBC with Differential  Psoriasis (a type of skin inflammation) - Plan: betamethasone valerate ointment (VALISONE) 0.1 %   Dementia is stable.  Check renal function panel, hepatic function panel.  COPD does not sound well controlled but I suspect this is secondary to noncompliance with his Anoro.  Reinforced need for use of this daily.  Encouraged him to purchase a pulse oximeter to monitor oxygen levels at nighttime.  May need to consider sleep study to look for nocturnal hypoxia and/or obstructive sleep apnea.  There are plans for radiation for the squamous cell lung cancer that was found.  PET scan to be reviewed with oncology tomorrow.  We had a discussion with regards to proper skin care and use of the topical steroid for psoriatic flares.  I have sent in a larger quantity of this ointment to the local pharmacy.  Release of information form was completed to fax labs to his providers at the [place].  I will make sure that his labs are sent as well.   No orders of the defined types were placed in this encounter.  No orders of the defined types were placed in this encounter.    Erik Norlander, DO [place] [phone]

## 2020-07-10 ENCOUNTER — Other Ambulatory Visit: Payer: Self-pay

## 2020-07-10 ENCOUNTER — Inpatient Hospital Stay (HOSPITAL_BASED_OUTPATIENT_CLINIC_OR_DEPARTMENT_OTHER): Payer: Medicare Other | Admitting: Hematology

## 2020-07-10 VITALS — BP 158/102 | HR 80 | Temp 96.9°F | Resp 16 | Wt 177.4 lb

## 2020-07-10 DIAGNOSIS — C3492 Malignant neoplasm of unspecified part of left bronchus or lung: Secondary | ICD-10-CM

## 2020-07-10 DIAGNOSIS — Z87891 Personal history of nicotine dependence: Secondary | ICD-10-CM | POA: Diagnosis not present

## 2020-07-10 DIAGNOSIS — C61 Malignant neoplasm of prostate: Secondary | ICD-10-CM | POA: Diagnosis not present

## 2020-07-10 DIAGNOSIS — C3411 Malignant neoplasm of upper lobe, right bronchus or lung: Secondary | ICD-10-CM | POA: Diagnosis not present

## 2020-07-10 DIAGNOSIS — Z23 Encounter for immunization: Secondary | ICD-10-CM | POA: Diagnosis not present

## 2020-07-10 DIAGNOSIS — Z808 Family history of malignant neoplasm of other organs or systems: Secondary | ICD-10-CM | POA: Diagnosis not present

## 2020-07-10 DIAGNOSIS — N189 Chronic kidney disease, unspecified: Secondary | ICD-10-CM | POA: Diagnosis not present

## 2020-07-10 LAB — CBC WITH DIFFERENTIAL/PLATELET
Basophils Absolute: 0.1 10*3/uL (ref 0.0–0.2)
Basos: 1 %
EOS (ABSOLUTE): 0.3 10*3/uL (ref 0.0–0.4)
Eos: 5 %
Hematocrit: 45.7 % (ref 37.5–51.0)
Hemoglobin: 15.3 g/dL (ref 13.0–17.7)
Immature Grans (Abs): 0 10*3/uL (ref 0.0–0.1)
Immature Granulocytes: 0 %
Lymphocytes Absolute: 1.1 10*3/uL (ref 0.7–3.1)
Lymphs: 15 %
MCH: 29.4 pg (ref 26.6–33.0)
MCHC: 33.5 g/dL (ref 31.5–35.7)
MCV: 88 fL (ref 79–97)
Monocytes Absolute: 0.5 10*3/uL (ref 0.1–0.9)
Monocytes: 7 %
Neutrophils Absolute: 5.3 10*3/uL (ref 1.4–7.0)
Neutrophils: 72 %
Platelets: 187 10*3/uL (ref 150–450)
RBC: 5.21 x10E6/uL (ref 4.14–5.80)
RDW: 13.4 % (ref 11.6–15.4)
WBC: 7.4 10*3/uL (ref 3.4–10.8)

## 2020-07-10 LAB — HEPATIC FUNCTION PANEL
ALT: 11 IU/L (ref 0–44)
AST: 21 IU/L (ref 0–40)
Alkaline Phosphatase: 107 IU/L (ref 44–121)
Bilirubin Total: 0.3 mg/dL (ref 0.0–1.2)
Bilirubin, Direct: <0.1 mg/dL (ref 0.00–0.40)
Total Protein: 7.1 g/dL (ref 6.0–8.5)

## 2020-07-10 LAB — RENAL FUNCTION PANEL
Albumin: 4.3 g/dL (ref 3.6–4.6)
BUN/Creatinine Ratio: 12 (ref 10–24)
BUN: 21 mg/dL (ref 8–27)
CO2: 25 mmol/L (ref 20–29)
Calcium: 9.5 mg/dL (ref 8.6–10.2)
Chloride: 104 mmol/L (ref 96–106)
Creatinine, Ser: 1.7 mg/dL — ABNORMAL HIGH (ref 0.76–1.27)
GFR calc Af Amer: 41 mL/min/{1.73_m2} — ABNORMAL LOW (ref >59)
GFR calc non Af Amer: 36 mL/min/{1.73_m2} — ABNORMAL LOW (ref >59)
Glucose: 90 mg/dL (ref 65–99)
Phosphorus: 3.4 mg/dL (ref 2.8–4.1)
Potassium: 4.9 mmol/L (ref 3.5–5.2)
Sodium: 142 mmol/L (ref 134–144)

## 2020-07-14 NOTE — Progress Notes (Signed)
Radiation Oncology         [phone] ________________________________  Initial Outpatient Consultation  Name: Erik Mullins MRN: [id number]  Date: 07/15/2020  DOB: 08-Jul-1931  TK:ZSWFUXNATF, Koleen Distance, DO  Derek Jack, MD   REFERRING PHYSICIAN: Derek Jack, MD  DIAGNOSIS: The encounter diagnosis was Squamous cell lung cancer, left ([place]).  Left upper lobe squamous cell carcinoma, clinical stage I  HISTORY OF PRESENT ILLNESS::Erik Mullins is a 86 y.o. male who is seen as a courtesy of Dr. Delton Coombes for an opinion concerning radiation therapy as part of management for her recently diagnosed lung cancer. Today, he is accompanied by wife. The patient has been followed by Dr. Jeffie Pollock since being diagnosed with prostate cancer in April of 2010. He completed radiation therapy on 08/16/2009. He then had a PSA recurrence and was started on Lupron in 2017. PSA had gone from 9.9 in June of 2017 to 0.2 in September of 2018, but has been slowly rising since then. He has been doing well without other associated signs of symptoms. Most recently, he underwent a restaging Axumin PET scan on 05/14/2020 that showed a hypermetabolic left upper lobe pulmonary nodule. Differential considerations included prostate cancer metastasis and Axumin avid primary bronchogenic carcinoma. Additionally, there was a smaller right upper lobe pulmonary nodule that was below PET resolution. Finally, there were radiation seeds in the prostate with diffuse hypermetabolism that were indeterminate.   The patient underwent a CT-guided biopsy of the left upper lobe pulmonary nodule on 06/17/2020 by Dr. Pascal Lux. Pathology from the procedure revealed squamous cell carcinoma.  Given the above results, the patient was referred to Dr. Roxan Hockey, who recommended stereotactic radiation. The patient was not considered a surgical candidate given his advanced age, dementia, poor functional status, and comorbidities.   PET scan on  07/08/2020 showed a 13 mm left upper lobe nodule that was minimally progressive and corresponded to the patient's known primary bronchogenic carcinoma. There was also noted to be focal hypermetabolism along the right prostate that was nonspecific and favored inflammation over neoplasm. There were no findings suspicious for metastatic disease.  The patient was referred to Dr. Delton Coombes and was most recently seen on 07/10/2020. It was recommended that he proceed with SBRT and undergo a repeat chest CT scan after 4-6 months.   PREVIOUS RADIATION THERAPY: Yes, patient was treated for prostate cancer at the [facility] cancer center at [facility].  Details pending at this time  PAST MEDICAL HISTORY:  Past Medical History:  Diagnosis Date  . AAA (abdominal aortic aneurysm) ([place])    Remote ~1994   . Anemia   . Arthritis   . Atrial fibrillation [facility])    Diagnosed December 2014  . AVNRT (AV nodal re-entry tachycardia) ([place])    Possible  . Cataract    Bilateral  . Cirrhosis ([place])   . CKD (chronic kidney disease) stage 3, GFR 30-59 ml/min (H[facility])   . Essential hypertension   . Gout   . History of GI bleed   . History of gout   . History of hepatitis   . History of stroke   . Hyperlipidemia   . PAD (peripheral artery disease) ([facility])    Stenting in lower extremities (no records)  . Prostate cancer ([place])    XRT; in remission  . Type 2 diabetes mellitus ([place])     PAST SURGICAL HISTORY: Past Surgical History:  Procedure Laterality Date  . ABDOMINAL AORTIC ANEURYSM REPAIR  1980's   in [place]  . APPENDECTOMY    .  CAROTID ENDARTERECTOMY Right ~ 1999  . CATARACT EXTRACTION W/ INTRAOCULAR LENS  IMPLANT, BILATERAL Bilateral 2014  . CHOLECYSTECTOMY    . COLON SURGERY    . PARTIAL COLECTOMY     "I was bleeding to death inside; took out 2/3 of my colon" (08/22/2013)    FAMILY HISTORY:  Family History  Problem Relation Age of Onset  . Cancer Mother        uterine deceased age 23  .  Hypertension Sister   . Arrhythmia Other        Atrial fibrillation  . Hypertension Maternal Grandmother     SOCIAL HISTORY:  Social History   Tobacco Use  . Smoking status: Former Smoker    Packs/day: 0.25    Years: 70.00    Pack years: 17.50    Types: Cigarettes    Start date: 09/11/1944    Quit date: 02/17/2020    Years since quitting: 0.4  . Smokeless tobacco: Former Systems developer    Types: Snuff, Chew  Vaping Use  . Vaping Use: Never used  Substance Use Topics  . Alcohol use: Not Currently    Alcohol/week: 0.0 standard drinks    Comment: 08/22/2013 "drink ~ 1 beer and wine/yr"  . Drug use: No    ALLERGIES:  Allergies  Allergen Reactions  . Morphine Other (See Comments)    hallucinate    MEDICATIONS:  Current Outpatient Medications  Medication Sig Dispense Refill  . acetaminophen (TYLENOL) 500 MG tablet Take 1,000 mg by mouth every 6 (six) hours as needed for headache.    . albuterol (VENTOLIN HFA) 108 (90 Base) MCG/ACT inhaler Inhale 1-2 puffs into the lungs every 6 (six) hours as needed for wheezing or shortness of breath. (Patient taking differently: Inhale 2 puffs into the lungs every 6 (six) hours as needed for wheezing or shortness of breath. ) 18 g 2  . allopurinol (ZYLOPRIM) 100 MG tablet TAKE 1 TABLET BY MOUTH EVERY DAY (Patient taking differently: Take 100 mg by mouth daily. ) 90 tablet 3  . Alpha-D-Galactosidase (BEANO ULTRA 800) TABS Take 1,600 mg by mouth daily as needed (gas).    . betamethasone valerate ointment (VALISONE) 0.1 % Apply 1 application topically 2 (two) times daily. Avoid face, axilla and groin. (use as needed for psoriasis flares) 90 g 1  . Calcium Carbonate-Vitamin D (CALTRATE 600+D) 600-400 MG-UNIT per tablet Take 1 tablet by mouth daily.      . Carboxymethylcellulose Sodium (ARTIFICIAL TEARS OP) Place 1 drop into both eyes daily as needed (dry eyes).    Marland Kitchen docusate sodium (COLACE) 100 MG capsule Take 100 mg by mouth at bedtime.     Marland Kitchen lisinopril  (ZESTRIL) 10 MG tablet TAKE 1 TABLET BY MOUTH EVERY DAY 90 tablet 0  . memantine (NAMENDA) 5 MG tablet Take 1 tablet (5 mg total) by mouth 2 (two) times daily. (Patient taking differently: Take 2.5 mg by mouth 2 (two) times daily. ) 180 tablet 3  . metoprolol tartrate (LOPRESSOR) 25 MG tablet TAKE 1/2 TABLET (12.5 MG TOTAL) BY MOUTH 2 (TWO) TIMES DAILY. (Patient taking differently: Take 12.5 mg by mouth 2 (two) times daily. ) 90 tablet 1  . Multiple Vitamin (MULTIVITAMIN) tablet Take 1 tablet by mouth daily.    Marland Kitchen umeclidinium-vilanterol (ANORO ELLIPTA) 62.5-25 MCG/INH AEPB Inhale 1 puff into the lungs daily. (Patient taking differently: Inhale 1 puff into the lungs daily as needed (shortness of breath). ) 60 each 12  . XARELTO 15 MG TABS tablet  TAKE 1 TABLET (15 MG TOTAL) BY MOUTH DAILY WITH SUPPER. 90 tablet 0   No current facility-administered medications for this encounter.    REVIEW OF SYSTEMS:  A 10+ POINT REVIEW OF SYSTEMS WAS OBTAINED including neurology, dermatology, psychiatry, cardiac, respiratory, lymph, extremities, GI, GU, musculoskeletal, constitutional, reproductive, HEENT.  He denies any pain within the chest area significant cough or hemoptysis   PHYSICAL EXAM:  height is 5\' 10"  (1.778 m) and weight is 174 lb 8 oz (79.2 kg). His temporal temperature is 97 F (36.1 C) (abnormal). His blood pressure is 174/104 (abnormal) and his pulse is 76. His respiration is 18 and oxygen saturation is 96%.   General: Alert and oriented, in no acute distress HEENT: Head is normocephalic. Extraocular movements are intact.  Neck: Neck is supple, no palpable cervical or supraclavicular lymphadenopathy. Heart: Regular in rate and rhythm with no murmurs, rubs, or gallops. Chest: Clear to auscultation bilaterally, with no rhonchi, wheezes, or rales. Abdomen: Soft, nontender, nondistended, with no rigidity or guarding. Extremities: No cyanosis or edema. Lymphatics: see Neck Exam Skin: No concerning  lesions. Musculoskeletal: symmetric strength and muscle tone throughout. Neurologic: Cranial nerves II through XII are grossly intact. No obvious focalities. Speech is fluent. Coordination is intact. Psychiatric: Judgment and insight are intact. Affect is appropriate.   ECOG = 1  0 - Asymptomatic (Fully active, able to carry on all predisease activities without restriction)  1 - Symptomatic but completely ambulatory (Restricted in physically strenuous activity but ambulatory and able to carry out work of a light or sedentary nature. For example, light housework, office work)  2 - Symptomatic, <50% in bed during the day (Ambulatory and capable of all self care but unable to carry out any work activities. Up and about more than 50% of waking hours)  3 - Symptomatic, >50% in bed, but not bedbound (Capable of only limited self-care, confined to bed or chair 50% or more of waking hours)  4 - Bedbound (Completely disabled. Cannot carry on any self-care. Totally confined to bed or chair)  5 - Death   Eustace Pen MM, Creech RH, Tormey DC, et al. [phone]). "Toxicity and response criteria of the [facility]". [place] Oncol. 5 (6): 649-55  LABORATORY DATA:  Lab Results  Component Value Date   WBC 7.4 07/09/2020   HGB 15.3 07/09/2020   HCT 45.7 07/09/2020   MCV 88 07/09/2020   PLT 187 07/09/2020   NEUTROABS 5.3 07/09/2020   Lab Results  Component Value Date   NA 142 07/09/2020   K 4.9 07/09/2020   CL 104 07/09/2020   CO2 25 07/09/2020   GLUCOSE 90 07/09/2020   CREATININE 1.70 (H) 07/09/2020   CALCIUM 9.5 07/09/2020      RADIOGRAPHY: NM PET Image Initial (PI) Skull Base To Thigh  Result Date: 07/09/2020 CLINICAL DATA:  Initial treatment strategy for left lung nodule. History of prostate cancer status post radiation. EXAM: NUCLEAR MEDICINE PET SKULL BASE TO THIGH TECHNIQUE: 12.7 mCi F-18 FDG was injected intravenously. Full-ring PET imaging was performed from the  skull base to thigh after the radiotracer. CT data was obtained and used for attenuation correction and anatomic localization. Fasting blood glucose: 98 mg/dl COMPARISON:  Axumin PET-CT dated 05/14/2020 FINDINGS: Mediastinal blood pool activity: SUV max 2.7 Liver activity: SUV max NA NECK: No hypermetabolic cervical lymphadenopathy. Incidental CT findings: none CHEST: 13 mm left upper lobe nodule (series 3/image 109), max SUV 10.0. This previously measured 10 mm. No hypermetabolic  thoracic lymphadenopathy. Incidental CT findings: Atherosclerotic calcifications of the aortic arch. Three vessel coronary atherosclerosis. ABDOMEN/PELVIS: No abnormal hypermetabolism in the liver, spleen, pancreas, or adrenal glands. No hypermetabolic abdominopelvic lymphadenopathy. Radiation fiducial markers along the prostate. Focal hypermetabolism along the right prostate, max SUV 6.2, nonspecific. Absence of hypermetabolism in this region on prior Axumin PET favors inflammation over neoplasm. Incidental CT findings: Status post cholecystectomy. Numerous left renal cysts of varying sizes and complexities. Aorta bi-iliac graft with stable aneurysm dilatation along the left external iliac (series 3/image 258). Atherosclerotic calcifications. Sigmoid diverticulosis. Mildly thick-walled bladder, although underdistended. Tiny fat containing bilateral inguinal hernias SKELETON: No focal hypermetabolic activity to suggest skeletal metastasis. Incidental CT findings: Degenerative changes of the visualized thoracolumbar spine. IMPRESSION: 13 mm left upper lobe nodule, minimally progressive, corresponding to the patient's known primary bronchogenic neoplasm. No findings suspicious for metastatic disease. Focal hypermetabolism along the right prostate is nonspecific and favors inflammation over neoplasm. Electronically Signed   By: Julian Hy M.D.   On: 07/09/2020 10:44   CT BIOPSY  Result Date: 06/17/2020 INDICATION: History of  prostate cancer, now with indeterminate left upper lobe pulmonary nodule. Please perform CT-guided biopsy for tissue diagnostic purposes. EXAM: CT GUIDED LEFT UPPER LOBE PULMONARY NODULE BIOPSY COMPARISON:  Axumin PET-CT-05/15/2020 MEDICATIONS: None. ANESTHESIA/SEDATION: Fentanyl 25 mcg IV; Versed 0.5 mg IV Sedation time: 18 minutes; The patient was continuously monitored during the procedure by the interventional radiology nurse under my direct supervision. CONTRAST:  None COMPLICATIONS: None immediate. PROCEDURE: Informed consent was obtained from the patient following an explanation of the procedure, risks, benefits and alternatives. The patient understands,agrees and consents for the procedure. All questions were addressed. A time out was performed prior to the initiation of the procedure. The patient was positioned supine, slightly RPO on the CT table and a limited chest CT was performed for procedural planning demonstrating unchanged size and appearance of the somewhat macrolobulated approximately 1.2 x 1.0 cm nodule within the left upper lobe (image 17, series 2). The operative site was prepped and draped in the usual sterile fashion. Under sterile conditions and local anesthesia, a 17 gauge coaxial needle was advanced into the peripheral aspect of the nodule. Positioning was confirmed with intermittent CT fluoroscopy and followed by the acquisition of 3 with an 18 gauge core needle biopsy device. The coaxial needle was removed following deployment of a Biosentry plug and superficial hemostasis was achieved with manual compression. Limited post procedural chest CT was negative for pneumothorax or additional complication. A dressing was placed. The patient tolerated the procedure well without immediate postprocedural complication. The patient was escorted to have an upright chest radiograph. IMPRESSION: Technically successful CT guided core needle core biopsy of indeterminate left upper lobe pulmonary nodule.  Electronically Signed   By: Sandi Mariscal M.D.   On: 06/17/2020 13:52   DG Chest Port 1 View  Result Date: 06/17/2020 CLINICAL DATA:  Post left lung nodule biopsy, now with postprocedural chest pain. EXAM: PORTABLE CHEST 1 VIEW COMPARISON:  05/13/2017; CT guided left lung nodule biopsy-earlier same day; PET-CT-05/14/2020 FINDINGS: Grossly unchanged cardiac silhouette and mediastinal contours. Redemonstrated known ill-defined nodule within the peripheral aspect of the left mid lung. No definite pneumothorax following recent CT-guided biopsy. No discrete focal airspace opacities. No pleural effusion or pneumothorax. Atherosclerotic plaque within the thoracic aorta. No acute osseous abnormalities. Surgical clips overlie the midline of the upper abdomen. No acute osseous abnormalities. Old/healed right posterolateral rib fractures. IMPRESSION: No definite complication following left-sided CT-guided lung nodule biopsy.  Specifically, no definite pneumothorax. Electronically Signed   By: Sandi Mariscal M.D.   On: 06/17/2020 13:07      IMPRESSION: Left upper lobe squamous cell carcinoma  The patient will be an excellent candidate for stereotactic body radiation therapy directed his clinical stage I non-small cell lung cancer presenting in the left upper lobe.  As above the patient has seen cardiothoracic surgery and is not a candidate for resection.  Today, I talked to the patient and wife about the findings and work-up thus far.  We discussed the natural history of lung cancer and general treatment, highlighting the role of radiotherapy in the management.  We discussed the available radiation techniques, and focused on the details of logistics and delivery.  We reviewed the anticipated acute and late sequelae associated with radiation in this setting.  The patient was encouraged to ask questions that I answered to the best of my ability.  A patient consent form was discussed and signed.  We retained a copy for our  records.  The patient would like to proceed with radiation and will be scheduled for CT simulation.  PLAN: Patient will return later this week for SBRT simulation with treatments to begin approximately a week and a half.  Anticipate three SBRTtreatments directed at his solitary lesion in the left upper lobe.  Total time spent in this encounter was 60 minutes which included reviewing the patient's most recent PET scans, consultations, follow-ups, biopsy, pathology report, physical examination, and documentation.    ------------------------------------------------  Blair Promise, PhD, MD  This document serves as a record of services personally performed by Gery Pray, MD. It was created on his behalf by Clerance Lav, a trained medical scribe. The creation of this record is based on the scribe's personal observations and the provider's statements to them. This document has been checked and approved by the attending provider.

## 2020-07-15 ENCOUNTER — Encounter: Payer: Self-pay | Admitting: Radiation Oncology

## 2020-07-15 ENCOUNTER — Ambulatory Visit
Admission: RE | Admit: 2020-07-15 | Discharge: 2020-07-15 | Disposition: A | Discharge status: Home / Self Care | Payer: Medicare Other | Source: Ambulatory Visit | Attending: Radiation Oncology | Admitting: Radiation Oncology

## 2020-07-15 ENCOUNTER — Other Ambulatory Visit: Payer: Self-pay

## 2020-07-15 DIAGNOSIS — Z8546 Personal history of malignant neoplasm of prostate: Secondary | ICD-10-CM | POA: Diagnosis not present

## 2020-07-15 DIAGNOSIS — Z79899 Other long term (current) drug therapy: Secondary | ICD-10-CM | POA: Diagnosis not present

## 2020-07-15 DIAGNOSIS — Z87891 Personal history of nicotine dependence: Secondary | ICD-10-CM | POA: Diagnosis not present

## 2020-07-15 DIAGNOSIS — I4891 Unspecified atrial fibrillation: Secondary | ICD-10-CM | POA: Diagnosis not present

## 2020-07-15 DIAGNOSIS — Z7901 Long term (current) use of anticoagulants: Secondary | ICD-10-CM | POA: Diagnosis not present

## 2020-07-15 DIAGNOSIS — E1122 Type 2 diabetes mellitus with diabetic chronic kidney disease: Secondary | ICD-10-CM | POA: Insufficient documentation

## 2020-07-15 DIAGNOSIS — E1136 Type 2 diabetes mellitus with diabetic cataract: Secondary | ICD-10-CM | POA: Insufficient documentation

## 2020-07-15 DIAGNOSIS — I129 Hypertensive chronic kidney disease with stage 1 through stage 4 chronic kidney disease, or unspecified chronic kidney disease: Secondary | ICD-10-CM | POA: Insufficient documentation

## 2020-07-15 DIAGNOSIS — Z923 Personal history of irradiation: Secondary | ICD-10-CM | POA: Diagnosis not present

## 2020-07-15 DIAGNOSIS — C3412 Malignant neoplasm of upper lobe, left bronchus or lung: Secondary | ICD-10-CM | POA: Insufficient documentation

## 2020-07-15 DIAGNOSIS — Z8673 Personal history of transient ischemic attack (TIA), and cerebral infarction without residual deficits: Secondary | ICD-10-CM | POA: Diagnosis not present

## 2020-07-15 DIAGNOSIS — N183 Chronic kidney disease, stage 3 unspecified: Secondary | ICD-10-CM | POA: Insufficient documentation

## 2020-07-15 DIAGNOSIS — K7460 Unspecified cirrhosis of liver: Secondary | ICD-10-CM | POA: Diagnosis not present

## 2020-07-15 DIAGNOSIS — I714 Abdominal aortic aneurysm, without rupture: Secondary | ICD-10-CM | POA: Diagnosis not present

## 2020-07-15 DIAGNOSIS — C3492 Malignant neoplasm of unspecified part of left bronchus or lung: Secondary | ICD-10-CM

## 2020-07-15 DIAGNOSIS — F0390 Unspecified dementia without behavioral disturbance: Secondary | ICD-10-CM | POA: Insufficient documentation

## 2020-07-15 DIAGNOSIS — E1151 Type 2 diabetes mellitus with diabetic peripheral angiopathy without gangrene: Secondary | ICD-10-CM | POA: Diagnosis not present

## 2020-07-15 DIAGNOSIS — E785 Hyperlipidemia, unspecified: Secondary | ICD-10-CM | POA: Insufficient documentation

## 2020-07-15 NOTE — Progress Notes (Signed)
Patient here today for a consult with Dr. Sondra Come.  Thoracic Location of Tumor / Histology: Left upper lobe   Patient presented: 05/14/2020 for a PET scan and nodule seen.  Biopsies: squamous cell carcinoma  Tobacco/Marijuana/Snuff/ETOH use: 17.50 pack years, quit 02/17/2020  Past/Anticipated interventions by cardiothoracic surgery, if any: none  Past/Anticipated interventions by medical oncology, if any: none  Signs/Symptoms   Weight changes, if any: none   Respiratory complaints, if any: none    Hemoptysis, if any: no    Pain issues, if any:  no  SAFETY ISSUES:   Prior radiation? 2010 prostate radiation   Pacemaker/ICD? no   Possible current pregnancy? N/A   Is the patient on methotrexate? no   Current Complaints / other details:  Accompanied by his wife  BP (!) 174/104 (BP Location: Left Arm, Patient Position: Sitting)   Pulse 76   Temp (!) 97 F (36.1 C) (Temporal)   Resp 18   Ht 5\' 10"  (1.778 m)   Wt 174 lb 8 oz (79.2 kg)   SpO2 96%   BMI 25.04 kg/m   Wt Readings from Last 3 Encounters:  07/15/20 174 lb 8 oz (79.2 kg)  07/10/20 177 lb 6.4 oz (80.5 kg)  07/09/20 177 lb 9.6 oz (80.6 kg)

## 2020-07-16 ENCOUNTER — Encounter (HOSPITAL_COMMUNITY): Payer: Self-pay | Admitting: General Practice

## 2020-07-16 NOTE — Progress Notes (Signed)
[place] Psychosocial Distress Screening Clinical Social Work  Clinical Social Work was referred by distress screening protocol.  The patient scored a 7 on the Psychosocial Distress Thermometer which indicates moderate distress. Clinical Social Worker did not contact patient as he declined SW referral to assess for distress and other psychosocial needs.   ONCBCN DISTRESS SCREENING 07/15/2020  Screening Type Initial Screening  Distress experienced in past week (1-10) 7  Referral to clinical social work No  Other patient declined social work Engineer, manufacturing systems follow up needed: No.  Please reconsult w SW services are needed.   If yes, follow up plan:  Beverely Pace, [place], LCSW Clinical Social Worker Phone:  [phone]

## 2020-07-18 ENCOUNTER — Other Ambulatory Visit: Payer: Self-pay

## 2020-07-18 ENCOUNTER — Ambulatory Visit
Admission: RE | Admit: 2020-07-18 | Discharge: 2020-07-18 | Disposition: A | Discharge status: Home / Self Care | Payer: Medicare Other | Source: Ambulatory Visit | Attending: Radiation Oncology | Admitting: Radiation Oncology

## 2020-07-18 DIAGNOSIS — Z87891 Personal history of nicotine dependence: Secondary | ICD-10-CM | POA: Insufficient documentation

## 2020-07-18 DIAGNOSIS — Z510 Encounter for antineoplastic radiation therapy: Secondary | ICD-10-CM | POA: Insufficient documentation

## 2020-07-18 DIAGNOSIS — C3492 Malignant neoplasm of unspecified part of left bronchus or lung: Secondary | ICD-10-CM

## 2020-07-18 DIAGNOSIS — C3412 Malignant neoplasm of upper lobe, left bronchus or lung: Secondary | ICD-10-CM | POA: Diagnosis not present

## 2020-07-22 DIAGNOSIS — C3412 Malignant neoplasm of upper lobe, left bronchus or lung: Secondary | ICD-10-CM | POA: Diagnosis not present

## 2020-07-22 DIAGNOSIS — Z510 Encounter for antineoplastic radiation therapy: Secondary | ICD-10-CM | POA: Diagnosis not present

## 2020-07-22 DIAGNOSIS — Z87891 Personal history of nicotine dependence: Secondary | ICD-10-CM | POA: Diagnosis not present

## 2020-07-24 DIAGNOSIS — Z23 Encounter for immunization: Secondary | ICD-10-CM | POA: Diagnosis not present

## 2020-07-25 DIAGNOSIS — E875 Hyperkalemia: Secondary | ICD-10-CM | POA: Diagnosis not present

## 2020-07-25 DIAGNOSIS — I480 Paroxysmal atrial fibrillation: Secondary | ICD-10-CM | POA: Diagnosis present

## 2020-07-25 DIAGNOSIS — I509 Heart failure, unspecified: Secondary | ICD-10-CM | POA: Diagnosis not present

## 2020-07-25 DIAGNOSIS — J9601 Acute respiratory failure with hypoxia: Secondary | ICD-10-CM | POA: Diagnosis not present

## 2020-07-25 DIAGNOSIS — F17290 Nicotine dependence, other tobacco product, uncomplicated: Secondary | ICD-10-CM | POA: Diagnosis present

## 2020-07-25 DIAGNOSIS — R778 Other specified abnormalities of plasma proteins: Secondary | ICD-10-CM | POA: Diagnosis not present

## 2020-07-25 DIAGNOSIS — I340 Nonrheumatic mitral (valve) insufficiency: Secondary | ICD-10-CM | POA: Diagnosis not present

## 2020-07-25 DIAGNOSIS — J811 Chronic pulmonary edema: Secondary | ICD-10-CM | POA: Diagnosis not present

## 2020-07-25 DIAGNOSIS — N183 Chronic kidney disease, stage 3 unspecified: Secondary | ICD-10-CM | POA: Diagnosis present

## 2020-07-25 DIAGNOSIS — C3492 Malignant neoplasm of unspecified part of left bronchus or lung: Secondary | ICD-10-CM | POA: Diagnosis not present

## 2020-07-25 DIAGNOSIS — J449 Chronic obstructive pulmonary disease, unspecified: Secondary | ICD-10-CM | POA: Diagnosis not present

## 2020-07-25 DIAGNOSIS — J810 Acute pulmonary edema: Secondary | ICD-10-CM | POA: Diagnosis not present

## 2020-07-25 DIAGNOSIS — R0902 Hypoxemia: Secondary | ICD-10-CM | POA: Diagnosis not present

## 2020-07-25 DIAGNOSIS — I129 Hypertensive chronic kidney disease with stage 1 through stage 4 chronic kidney disease, or unspecified chronic kidney disease: Secondary | ICD-10-CM | POA: Diagnosis present

## 2020-07-25 DIAGNOSIS — R404 Transient alteration of awareness: Secondary | ICD-10-CM | POA: Diagnosis not present

## 2020-07-25 DIAGNOSIS — Z20822 Contact with and (suspected) exposure to covid-19: Secondary | ICD-10-CM | POA: Diagnosis not present

## 2020-07-25 DIAGNOSIS — J42 Unspecified chronic bronchitis: Secondary | ICD-10-CM | POA: Diagnosis not present

## 2020-07-25 DIAGNOSIS — N1832 Chronic kidney disease, stage 3b: Secondary | ICD-10-CM | POA: Diagnosis not present

## 2020-07-25 DIAGNOSIS — I4891 Unspecified atrial fibrillation: Secondary | ICD-10-CM | POA: Diagnosis not present

## 2020-07-25 DIAGNOSIS — Z7901 Long term (current) use of anticoagulants: Secondary | ICD-10-CM | POA: Diagnosis not present

## 2020-07-25 DIAGNOSIS — S2231XA Fracture of one rib, right side, initial encounter for closed fracture: Secondary | ICD-10-CM | POA: Diagnosis not present

## 2020-07-25 DIAGNOSIS — I517 Cardiomegaly: Secondary | ICD-10-CM | POA: Diagnosis not present

## 2020-07-25 DIAGNOSIS — R911 Solitary pulmonary nodule: Secondary | ICD-10-CM | POA: Diagnosis not present

## 2020-07-25 DIAGNOSIS — R410 Disorientation, unspecified: Secondary | ICD-10-CM | POA: Diagnosis not present

## 2020-07-25 DIAGNOSIS — R069 Unspecified abnormalities of breathing: Secondary | ICD-10-CM | POA: Diagnosis not present

## 2020-07-25 DIAGNOSIS — R0602 Shortness of breath: Secondary | ICD-10-CM | POA: Diagnosis not present

## 2020-07-29 ENCOUNTER — Telehealth: Payer: Self-pay | Admitting: *Deleted

## 2020-07-29 NOTE — Telephone Encounter (Signed)
Contact Date: 07/29/2020 Contacted By: Baldomero Lamy, LPN  Transition Care Management Follow-up Telephone Call  Date of discharge and from where: 07/27/20 [facility]  How have you been since you were released from the hospital? Wife says patient stated he feels like a new man.   Any questions or concerns? No   Items Reviewed:  Did the pt receive and understand the discharge instructions provided? Yes   Medications obtained and verified? Yes  Any new allergies since your discharge? No   Dietary orders reviewed? Yes  Do you have support at home? Yes   Discontinued Medications Lisinopril Metoprolol 12.5 BID New Medications Added Furosemide 20 QD Metoprolol 25 BID  Current Medication List Allergies as of 07/29/2020      Reactions   Morphine Other (See Comments)   hallucinate      Medication List       Accurate as of July 29, 2020  8:34 AM. If you have any questions, ask your nurse or doctor.        STOP taking these medications   lisinopril 10 MG tablet Commonly known as: ZESTRIL Stopped by: Yohan Samons, JAMIE B, LPN     TAKE these medications   acetaminophen 500 MG tablet Commonly known as: TYLENOL Take 1,000 mg by mouth every 6 (six) hours as needed for headache.   albuterol 108 (90 Base) MCG/ACT inhaler Commonly known as: VENTOLIN HFA Inhale 1-2 puffs into the lungs every 6 (six) hours as needed for wheezing or shortness of breath. What changed: how much to take   allopurinol 100 MG tablet Commonly known as: ZYLOPRIM TAKE 1 TABLET BY MOUTH EVERY DAY   Anoro Ellipta 62.5-25 MCG/INH Aepb Generic drug: umeclidinium-vilanterol Inhale 1 puff into the lungs daily. What changed:   when to take this  reasons to take this   ARTIFICIAL TEARS OP Place 1 drop into both eyes daily as needed (dry eyes).   Beano Ultra 800 Tabs Take 1,600 mg by mouth daily as needed (gas).   betamethasone valerate ointment 0.1 % Commonly known as: VALISONE Apply 1  application topically 2 (two) times daily. Avoid face, axilla and groin. (use as needed for psoriasis flares)   Caltrate 600+D 600-400 MG-UNIT tablet Generic drug: Calcium Carbonate-Vitamin D Take 1 tablet by mouth daily.   docusate sodium 100 MG capsule Commonly known as: COLACE Take 100 mg by mouth at bedtime.   furosemide 20 MG tablet Commonly known as: LASIX Take 20 mg by mouth daily.   memantine 5 MG tablet Commonly known as: Namenda Take 1 tablet (5 mg total) by mouth 2 (two) times daily. What changed: how much to take   metoprolol tartrate 25 MG tablet Commonly known as: LOPRESSOR Take 25 mg by mouth 2 (two) times daily. What changed: Another medication with the same name was removed. Continue taking this medication, and follow the directions you see here. Changed by: Alphia Kava B, LPN   multivitamin tablet Take 1 tablet by mouth daily.   Xarelto 15 MG Tabs tablet Generic drug: Rivaroxaban TAKE 1 TABLET (15 MG TOTAL) BY MOUTH DAILY WITH SUPPER.        Home Care and Equipment/Supplies: Were home health services ordered? no If so, what is the name of the agency?   Has the agency set up a time to come to the patient's home? not applicable Were any new equipment or medical supplies ordered?  No What is the name of the medical supply agency?  Were you able to get  the supplies/equipment? not applicable Do you have any questions related to the use of the equipment or supplies?   Functional Questionnaire: (I = Independent and D = Dependent) ADLs: I  Bathing/Dressing- I  Meal Prep- D  Eating- I  Maintaining continence- I  Transferring/Ambulation- I  Managing Meds- D  Follow up appointments reviewed:   PCP Hospital f/u appt confirmed? Yes  Scheduled to see Dr Lajuana Ripple on 08/06/20 @ [facility] f/u appt confirmed? No    Are transportation arrangements needed? No   If their condition worsens, is the pt aware to call PCP or go to the  Emergency Dept.? Yes  Was the patient provided with contact information for the PCP's office or ED? Yes  Was to pt encouraged to call back with questions or concerns? Yes

## 2020-07-30 ENCOUNTER — Ambulatory Visit
Admission: RE | Admit: 2020-07-30 | Discharge: 2020-07-30 | Disposition: A | Discharge status: Home / Self Care | Payer: Medicare Other | Source: Ambulatory Visit | Attending: Radiation Oncology | Admitting: Radiation Oncology

## 2020-07-30 DIAGNOSIS — Z87891 Personal history of nicotine dependence: Secondary | ICD-10-CM | POA: Diagnosis not present

## 2020-07-30 DIAGNOSIS — C3492 Malignant neoplasm of unspecified part of left bronchus or lung: Secondary | ICD-10-CM

## 2020-07-30 DIAGNOSIS — C3412 Malignant neoplasm of upper lobe, left bronchus or lung: Secondary | ICD-10-CM | POA: Diagnosis not present

## 2020-07-30 DIAGNOSIS — Z510 Encounter for antineoplastic radiation therapy: Secondary | ICD-10-CM | POA: Diagnosis not present

## 2020-08-01 ENCOUNTER — Ambulatory Visit
Admission: RE | Admit: 2020-08-01 | Discharge: 2020-08-01 | Disposition: A | Discharge status: Home / Self Care | Payer: Medicare Other | Source: Ambulatory Visit | Attending: Radiation Oncology | Admitting: Radiation Oncology

## 2020-08-01 DIAGNOSIS — Z510 Encounter for antineoplastic radiation therapy: Secondary | ICD-10-CM | POA: Diagnosis not present

## 2020-08-01 DIAGNOSIS — C3412 Malignant neoplasm of upper lobe, left bronchus or lung: Secondary | ICD-10-CM | POA: Diagnosis not present

## 2020-08-01 DIAGNOSIS — C3492 Malignant neoplasm of unspecified part of left bronchus or lung: Secondary | ICD-10-CM

## 2020-08-01 DIAGNOSIS — Z87891 Personal history of nicotine dependence: Secondary | ICD-10-CM | POA: Diagnosis not present

## 2020-08-02 ENCOUNTER — Other Ambulatory Visit: Payer: Self-pay

## 2020-08-06 ENCOUNTER — Other Ambulatory Visit: Payer: Self-pay

## 2020-08-06 ENCOUNTER — Encounter: Payer: Self-pay | Admitting: Family Medicine

## 2020-08-06 ENCOUNTER — Encounter: Payer: Self-pay | Admitting: Radiation Oncology

## 2020-08-06 ENCOUNTER — Ambulatory Visit
Admission: RE | Admit: 2020-08-06 | Discharge: 2020-08-06 | Disposition: A | Discharge status: Home / Self Care | Payer: Medicare Other | Source: Ambulatory Visit | Attending: Radiation Oncology | Admitting: Radiation Oncology

## 2020-08-06 ENCOUNTER — Ambulatory Visit (INDEPENDENT_AMBULATORY_CARE_PROVIDER_SITE_OTHER): Payer: Medicare Other | Admitting: Family Medicine

## 2020-08-06 VITALS — BP 121/76 | HR 87 | Temp 97.7°F | Ht 70.0 in | Wt 170.4 lb

## 2020-08-06 DIAGNOSIS — Z09 Encounter for follow-up examination after completed treatment for conditions other than malignant neoplasm: Secondary | ICD-10-CM

## 2020-08-06 DIAGNOSIS — Z510 Encounter for antineoplastic radiation therapy: Secondary | ICD-10-CM | POA: Diagnosis not present

## 2020-08-06 DIAGNOSIS — R0902 Hypoxemia: Secondary | ICD-10-CM | POA: Diagnosis not present

## 2020-08-06 DIAGNOSIS — F418 Other specified anxiety disorders: Secondary | ICD-10-CM

## 2020-08-06 DIAGNOSIS — C3412 Malignant neoplasm of upper lobe, left bronchus or lung: Secondary | ICD-10-CM | POA: Diagnosis not present

## 2020-08-06 DIAGNOSIS — E875 Hyperkalemia: Secondary | ICD-10-CM | POA: Diagnosis not present

## 2020-08-06 DIAGNOSIS — Z87891 Personal history of nicotine dependence: Secondary | ICD-10-CM | POA: Diagnosis not present

## 2020-08-06 DIAGNOSIS — C3492 Malignant neoplasm of unspecified part of left bronchus or lung: Secondary | ICD-10-CM | POA: Diagnosis not present

## 2020-08-06 DIAGNOSIS — I480 Paroxysmal atrial fibrillation: Secondary | ICD-10-CM | POA: Diagnosis not present

## 2020-08-06 MED ORDER — MIRTAZAPINE 7.5 MG PO TABS: 7.5000 mg | ORAL_TABLET | Freq: Every day | ORAL | 1 refills | Status: DC

## 2020-08-06 NOTE — Progress Notes (Signed)
Subjective: CC: Hospital follow-up for hypoxemia, CHF, A. fib with RVR PCP: Janora Norlander, DO IFO:YDXA T Metts is a 86 y.o. male presenting to clinic today for:  Patient was recently hospitalized on the 11th for hypoxemia.  He was noted to be in A. fib with RVR and had pulmonary vascular congestion suggestive of heart failure.  Echocardiogram showed preserved systolic function.  It was thought that the fluid overload was related to the A. fib.  This A. fib resolved with IV Cardizem.  Fluid was treated with IV Lasix and he was ultimately transitioned over to Lasix 20 mg daily.  Additionally, he was noted to have hyperkalemia and subsequently was taken off of his ACE inhibitor.  He was treated with Kayexalate.  At discharge from the hospital potassium was 5.1.   No heart palpitations since discharge.  He is breathing normally on room air and has gone back to his normal inhalation schedule of Anoro daily and albuterol 2 puffs twice daily.  This seems to be working relatively well for him.  He is compliant with the Lasix and does report good urine output with it.  Home blood pressures have been running systolics 12-878 over diastolics of 67-67.  He takes his Lasix each morning.  Occasionally will have some dizziness if he gets up too quickly but no falls.  She admits that he has been having some issues with anxiety and depression and they would like to talk about starting an antidepressant.  He has never been on one before.   ROS: Per HPI  Allergies  Allergen Reactions   Morphine Other (See Comments)    hallucinate   Past Medical History:  Diagnosis Date   AAA (abdominal aortic aneurysm) ([place])    Remote ~1994    Anemia    Arthritis    Atrial fibrillation [facility])    Diagnosed December 2014   AVNRT (AV nodal re-entry tachycardia) ([place])    Possible   Cataract    Bilateral   Cirrhosis (HCC)    CKD (chronic kidney disease) stage 3, GFR 30-59 ml/min (HCC)    Essential  hypertension    Gout    History of GI bleed    History of gout    History of hepatitis    History of stroke    Hyperlipidemia    PAD (peripheral artery disease) (HCC)    Stenting in lower extremities (no records)   Prostate cancer ([place])    XRT; in remission   Type 2 diabetes mellitus (HCC)     Current Outpatient Medications:    acetaminophen (TYLENOL) 500 MG tablet, Take 1,000 mg by mouth every 6 (six) hours as needed for headache., Disp: , Rfl:    albuterol (VENTOLIN HFA) 108 (90 Base) MCG/ACT inhaler, Inhale 1-2 puffs into the lungs every 6 (six) hours as needed for wheezing or shortness of breath. (Patient taking differently: Inhale 2 puffs into the lungs every 6 (six) hours as needed for wheezing or shortness of breath. ), Disp: 18 g, Rfl: 2   allopurinol (ZYLOPRIM) 100 MG tablet, TAKE 1 TABLET BY MOUTH EVERY DAY (Patient taking differently: Take 100 mg by mouth daily. ), Disp: 90 tablet, Rfl: 3   Alpha-D-Galactosidase (BEANO ULTRA 800) TABS, Take 1,600 mg by mouth daily as needed (gas)., Disp: , Rfl:    betamethasone valerate ointment (VALISONE) 0.1 %, Apply 1 application topically 2 (two) times daily. Avoid face, axilla and groin. (use as needed for psoriasis flares), Disp: 90 g, Rfl:  1   Calcium Carbonate-Vitamin D (CALTRATE 600+D) 600-400 MG-UNIT per tablet, Take 1 tablet by mouth daily.  , Disp: , Rfl:    Carboxymethylcellulose Sodium (ARTIFICIAL TEARS OP), Place 1 drop into both eyes daily as needed (dry eyes)., Disp: , Rfl:    docusate sodium (COLACE) 100 MG capsule, Take 100 mg by mouth at bedtime. , Disp: , Rfl:    furosemide (LASIX) 20 MG tablet, Take 20 mg by mouth daily., Disp: , Rfl:    memantine (NAMENDA) 5 MG tablet, Take 1 tablet (5 mg total) by mouth 2 (two) times daily. (Patient taking differently: Take 2.5 mg by mouth 2 (two) times daily. ), Disp: 180 tablet, Rfl: 3   metoprolol tartrate (LOPRESSOR) 25 MG tablet, Take 25 mg by mouth 2 (two) times  daily. , Disp: , Rfl:    Multiple Vitamin (MULTIVITAMIN) tablet, Take 1 tablet by mouth daily., Disp: , Rfl:    umeclidinium-vilanterol (ANORO ELLIPTA) 62.5-25 MCG/INH AEPB, Inhale 1 puff into the lungs daily. (Patient taking differently: Inhale 1 puff into the lungs daily as needed (shortness of breath). ), Disp: 60 each, Rfl: 12   XARELTO 15 MG TABS tablet, TAKE 1 TABLET (15 MG TOTAL) BY MOUTH DAILY WITH SUPPER., Disp: 90 tablet, Rfl: 0 Social History   Socioeconomic History   Marital status: Married    Spouse name: Not on file   Number of children: Not on file   Years of education: Not on file   Highest education level: Not on file  Occupational History   Not on file  Tobacco Use   Smoking status: Former Smoker    Packs/day: 0.25    Years: 70.00    Pack years: 17.50    Types: Cigarettes    Start date: 09/11/1942    Quit date: 02/17/2020    Years since quitting: 0.4   Smokeless tobacco: Former Systems developer    Types: Snuff, Chew  Vaping Use   Vaping Use: Never used  Substance and Sexual Activity   Alcohol use: Not Currently    Alcohol/week: 0.0 standard drinks    Comment: 08/22/2013 "drink ~ 1 beer and wine/yr"   Drug use: No   Sexual activity: Never  Other Topics Concern   Not on file  Social History Narrative   From [place]. Relocated to this area from [facility].   Social Determinants of Health   Financial Resource Strain:    Difficulty of Paying Living Expenses: Not on file  Food Insecurity:    Worried About Charity fundraiser in the Last Year: Not on file   [employer] of Food in the Last Year: Not on file  Transportation Needs:    Lack of Transportation (Medical): Not on file   Lack of Transportation (Non-Medical): Not on file  Physical Activity:    Days of Exercise per Week: Not on file   Minutes of Exercise per Session: Not on file  Stress:    Feeling of Stress : Not on file  Social Connections:    Frequency of Communication with Friends  and Family: Not on file   Frequency of Social Gatherings with Friends and Family: Not on file   Attends Religious Services: Not on file   Active Member of Clubs or Organizations: Not on file   Attends Archivist Meetings: Not on file   Marital Status: Not on file  Intimate Partner Violence:    Fear of Current or Ex-Partner: Not on file   Emotionally Abused: Not  on file   Physically Abused: Not on file   Sexually Abused: Not on file   Family History  Problem Relation Age of Onset   Cancer Mother        uterine deceased age 31   Hypertension Sister    Arrhythmia Other        Atrial fibrillation   Hypertension Maternal Grandmother     Objective: Office vital signs reviewed. BP 121/76    Pulse 87    Temp 97.7 F (36.5 C)    Ht 5\' 10"  (1.778 m)    Wt 170 lb 6.4 oz (77.3 kg)    SpO2 97%    BMI 24.45 kg/m   Physical Examination:  General: Awake, alert, No acute distress HEENT: Normal, sclera white, MMM Cardio: Irregularly irregular with normal rate.  S1S2 heard, no murmurs appreciated Pulm: clear to auscultation bilaterally, no wheezes, rhonchi or rales; normal work of breathing on room air MSK: Slow gait and hunched station Psych: Mood is stable, speech is normal, affect.  Patient is pleasant and interactive  Depression screen [facility] 2/9 08/06/2020 08/06/2020 07/09/2020  Decreased Interest 2 0 0  Down, Depressed, Hopeless 2 0 0  PHQ - 2 Score 4 0 0  Altered sleeping 0 - -  Tired, decreased energy 3 - -  Change in appetite 0 - -  Feeling bad or failure about yourself  2 - -  Trouble concentrating 2 - -  Moving slowly or fidgety/restless 0 - -  Suicidal thoughts 1 - -  PHQ-9 Score 12 - -  Difficult doing work/chores Somewhat difficult - -  Some recent data might be hidden   GAD 7 : Generalized Anxiety Score 08/06/2020  Nervous, Anxious, on Edge 2  Control/stop worrying 2  Worry too much - different things 2  Trouble relaxing 2  Restless 0  Easily  annoyed or irritable 2  Afraid - awful might happen 0  Total GAD 7 Score 10  Anxiety Difficulty Somewhat difficult   Assessment/ Plan: 86 y.o. male   Hospital discharge follow-up  Hypoxia  Hyperkalemia - Plan: Renal Function Panel  Squamous cell lung cancer, left (HCC)  Paroxysmal atrial fibrillation (HCC)  Depression with anxiety - Plan: mirtazapine (REMERON) 7.5 MG tablet   Today's visit is for Transitional Care Management.  The patient was discharged from [facility] on 07/[facility]/2020 with a primary diagnosis of Hypoxia.   Contact with the patient and/or caregiver, by a clinical staff member, was made on 07/29/2020 and was documented as a telephone encounter within the EMR.  Through chart review and discussion with the patient I have determined that management of their condition is of moderate complexity.   He had rate controlled today.  I have ordered a recheck of his potassium level.  May need to consider potassium binder if persistently elevated off the ACE inhibitor.  I reviewed his EKG results from the hospitalization and QTC was normal.  Therefore I have started mirtazapine at low dose to help with anxiety and depression.  We will follow-up again in 4 weeks.  If any abnormalities or concerns, could consider SNRI as an alternative.  Janora Norlander, DO [place] [phone]

## 2020-08-06 NOTE — Patient Instructions (Signed)
You had labs performed today.  You will be contacted with the results of the labs once they are available, usually in the next 3 business days for routine lab work.  If you have an active my chart account, they will be released to your MyChart.  If you prefer to have these labs released to you via telephone, please let us know.  If you had a pap smear or biopsy performed, expect to be contacted in about 7-10 days.  Taking the medicine as directed and not missing any doses is one of the best things you can do to treat your depression.  Here are some things to keep in mind:  1) Side effects (stomach upset, some increased anxiety) may happen before you notice a benefit.  These side effects typically go away over time. 2) Changes to your dose of medicine or a change in medication all together is sometimes necessary 3) Most people need to be on medication at least 12 months 4) Many people will notice an improvement within two weeks but the full effect of the medication can take up to 4-6 weeks 5) Stopping the medication when you start feeling better often results in a return of symptoms 6) Never discontinue your medication without contacting a health care professional first.  Some medications require gradual discontinuation/ taper and can make you sick if you stop them abruptly.  If your symptoms worsen or you have thoughts of suicide/homicide, PLEASE SEEK IMMEDIATE MEDICAL ATTENTION.  You may always call:  National Suicide Hotline: 365-821-0190 [place]: 586-045-8493 Crisis Recovery in [place]: 212-693-6189   These are available 24 hours a day, 7 days a week.

## 2020-08-07 ENCOUNTER — Other Ambulatory Visit: Payer: Self-pay | Admitting: Family Medicine

## 2020-08-07 ENCOUNTER — Telehealth: Payer: Self-pay

## 2020-08-07 DIAGNOSIS — N1832 Chronic kidney disease, stage 3b: Secondary | ICD-10-CM

## 2020-08-07 LAB — RENAL FUNCTION PANEL
Albumin: 4.4 g/dL (ref 3.6–4.6)
BUN/Creatinine Ratio: 21 (ref 10–24)
BUN: 45 mg/dL — ABNORMAL HIGH (ref 8–27)
CO2: 26 mmol/L (ref 20–29)
Calcium: 10.2 mg/dL (ref 8.6–10.2)
Chloride: 100 mmol/L (ref 96–106)
Creatinine, Ser: 2.16 mg/dL — ABNORMAL HIGH (ref 0.76–1.27)
GFR calc Af Amer: 31 mL/min/{1.73_m2} — ABNORMAL LOW (ref >59)
GFR calc non Af Amer: 27 mL/min/{1.73_m2} — ABNORMAL LOW (ref >59)
Glucose: 100 mg/dL — ABNORMAL HIGH (ref 65–99)
Phosphorus: 4.7 mg/dL — ABNORMAL HIGH (ref 2.8–4.1)
Potassium: 5.2 mmol/L (ref 3.5–5.2)
Sodium: 142 mmol/L (ref 134–144)

## 2020-08-07 NOTE — Telephone Encounter (Signed)
Aware. 

## 2020-08-12 ENCOUNTER — Telehealth: Payer: Self-pay

## 2020-08-12 NOTE — Telephone Encounter (Signed)
If we have any Trulance 3mg  OR Linzess 145, please offer samples to patient and instruct to take daily for constipation.  Let me know if helpful and I will prescribe.

## 2020-08-12 NOTE — Telephone Encounter (Signed)
We only have Linzess 72 mg samples so I did not put anything up front.  Will that work?

## 2020-08-12 NOTE — Telephone Encounter (Signed)
Those are fine.  Have him take 1-2 daily for constipation.

## 2020-08-12 NOTE — Telephone Encounter (Signed)
Patient was seen 11/23- I do not see anything in your note about medication.  Please advise

## 2020-08-12 NOTE — Telephone Encounter (Signed)
Patient aware that samples placed at front for pick up.

## 2020-08-19 ENCOUNTER — Other Ambulatory Visit: Payer: Self-pay | Admitting: Family Medicine

## 2020-08-19 DIAGNOSIS — N183 Chronic kidney disease, stage 3 unspecified: Secondary | ICD-10-CM

## 2020-08-20 ENCOUNTER — Other Ambulatory Visit: Payer: Medicare Other

## 2020-08-20 ENCOUNTER — Other Ambulatory Visit: Payer: Self-pay

## 2020-08-20 DIAGNOSIS — R9721 Rising PSA following treatment for malignant neoplasm of prostate: Secondary | ICD-10-CM

## 2020-08-20 DIAGNOSIS — C61 Malignant neoplasm of prostate: Secondary | ICD-10-CM

## 2020-08-20 DIAGNOSIS — N1832 Chronic kidney disease, stage 3b: Secondary | ICD-10-CM | POA: Diagnosis not present

## 2020-08-20 LAB — BMP8+EGFR
BUN/Creatinine Ratio: 16 (ref 10–24)
BUN: 30 mg/dL — ABNORMAL HIGH (ref 8–27)
CO2: 24 mmol/L (ref 20–29)
Calcium: 9.4 mg/dL (ref 8.6–10.2)
Chloride: 102 mmol/L (ref 96–106)
Creatinine, Ser: 1.83 mg/dL — ABNORMAL HIGH (ref 0.76–1.27)
GFR calc Af Amer: 38 mL/min/{1.73_m2} — ABNORMAL LOW (ref >59)
GFR calc non Af Amer: 33 mL/min/{1.73_m2} — ABNORMAL LOW (ref >59)
Glucose: 120 mg/dL — ABNORMAL HIGH (ref 65–99)
Potassium: 4.8 mmol/L (ref 3.5–5.2)
Sodium: 142 mmol/L (ref 134–144)

## 2020-08-21 LAB — PSA: Prostate Specific Ag, Serum: 2 ng/mL (ref 0.0–4.0)

## 2020-08-22 ENCOUNTER — Telehealth: Payer: Self-pay

## 2020-08-22 NOTE — Telephone Encounter (Signed)
  Please call patient: -trulance 3mg  daily samples given to RN in pools for patient to pick up -added to med list

## 2020-08-22 NOTE — Progress Notes (Signed)
Subjective:  1. Prostate cancer ([place])   2. Rising PSA following treatment for malignant neoplasm of prostate   3. Nocturia     08/23/20: Erik Mullins returns today in f/u.  He was found to have lung cancer at his last visit and has subsequently been treated with radiation therapy.    His PSA continues to rise and was 2.0 on 08/20/20 which is up from 1.5 in 7/21 and 0.8 in 12/20.  He T was castrate in 04/02/20.  He is voiding well but has stable nocturia 2-3x.  He has no bone pain or weight loss.    05/24/20: Erik Mullins returns today in f/u from an [place] PET done for his history of prostate cancer that was diagnosed in 4/10 and he completed radiation therapy on 08/16/09.  He had gleason 9 disease intiallly.  He had a PSA recurrence and was started on Lupron in 2017 after his PSA rose to 9.9 in 6/17 from 3.6 in 12/16.  A bone scan and CT in 7/17 showed no mets.   His PSA nadired at 0.2 on Lupron in 9/18 but has been rising and was 0.4 in 3/20, 0.8 in 12/20 and 1.5 on 04/02/20.  His testosterone has remained castrate.  His last Lupron injection was on 04/05/20.   He is doing well without other associated signs or symptoms.  He is voiding well with an IPSS of 3-4 with nocturia x 2-3.  He drinks a lot of coffee prior to bed.  He has no bone pain or weight loss.   He has no hematuria.     The PET scan showed a 1cm nodule in the left upper lobe that was hypermetabolic and could be prostatic or a bronchogenic carcinoma.  Tissue sampling was recommended.  There was some diffuse uptake in the prostate as well.       ROS:  ROS  Allergies  Allergen Reactions  . Morphine Other (See Comments)    hallucinate    Outpatient Encounter Medications as of 08/23/2020  Medication Sig Note  . acetaminophen (TYLENOL) 500 MG tablet Take 1,000 mg by mouth every 6 (six) hours as needed for headache.   . albuterol (VENTOLIN HFA) 108 (90 Base) MCG/ACT inhaler Inhale 1-2 puffs into the lungs every 6 (six) hours as needed for  wheezing or shortness of breath. (Patient taking differently: Inhale 2 puffs into the lungs every 6 (six) hours as needed for wheezing or shortness of breath.)   . allopurinol (ZYLOPRIM) 100 MG tablet TAKE 1 TABLET BY MOUTH EVERY DAY   . Alpha-D-Galactosidase (BEANO ULTRA 800) TABS Take 1,600 mg by mouth daily as needed (gas).   . betamethasone valerate ointment (VALISONE) 0.1 % Apply 1 application topically 2 (two) times daily. Avoid face, axilla and groin. (use as needed for psoriasis flares)   . Calcium Carbonate-Vitamin D 600-400 MG-UNIT tablet Take 1 tablet by mouth daily.   . Carboxymethylcellulose Sodium (ARTIFICIAL TEARS OP) Place 1 drop into both eyes daily as needed (dry eyes).   Marland Kitchen docusate sodium (COLACE) 100 MG capsule Take 100 mg by mouth at bedtime.    . furosemide (LASIX) 20 MG tablet Take 20 mg by mouth daily.   . memantine (NAMENDA) 5 MG tablet Take 1 tablet (5 mg total) by mouth 2 (two) times daily. (Patient taking differently: Take 2.5 mg by mouth 2 (two) times daily.)   . metoprolol tartrate (LOPRESSOR) 25 MG tablet Take 25 mg by mouth 2 (two) times daily.    Marland Kitchen  mirtazapine (REMERON) 7.5 MG tablet Take 1 tablet (7.5 mg total) by mouth at bedtime.   . Multiple Vitamin (MULTIVITAMIN) tablet Take 1 tablet by mouth daily.   Marland Kitchen Plecanatide (TRULANCE) 3 MG TABS Take 3 mg by mouth daily. 08/22/2020: Samples given   . umeclidinium-vilanterol (ANORO ELLIPTA) 62.5-25 MCG/INH AEPB Inhale 1 puff into the lungs daily. (Patient taking differently: Inhale 1 puff into the lungs daily as needed (shortness of breath).)   . XARELTO 15 MG TABS tablet TAKE 1 TABLET (15 MG TOTAL) BY MOUTH DAILY WITH SUPPER.    No facility-administered encounter medications on file as of 08/23/2020.    Past Medical History:  Diagnosis Date  . AAA (abdominal aortic aneurysm) ([place])    Remote ~1994   . Anemia   . Arthritis   . Atrial fibrillation [facility])    Diagnosed December 2014  . AVNRT (AV nodal re-entry  tachycardia) ([place])    Possible  . Cataract    Bilateral  . Cirrhosis ([place])   . CKD (chronic kidney disease) stage 3, GFR 30-59 ml/min (HCC)   . Essential hypertension   . Gout   . History of GI bleed   . History of gout   . History of hepatitis   . History of stroke   . Hyperlipidemia   . PAD (peripheral artery disease) (HCC)    Stenting in lower extremities (no records)  . Prostate cancer ([place])    XRT; in remission  . Type 2 diabetes mellitus ([place])     Past Surgical History:  Procedure Laterality Date  . ABDOMINAL AORTIC ANEURYSM REPAIR  1980's   in [place]  . APPENDECTOMY    . CAROTID ENDARTERECTOMY Right ~ 1999  . CATARACT EXTRACTION W/ INTRAOCULAR LENS  IMPLANT, BILATERAL Bilateral 2014  . CHOLECYSTECTOMY    . COLON SURGERY    . PARTIAL COLECTOMY     "I was bleeding to death inside; took out 2/3 of my colon" (08/22/2013)    Social History   Socioeconomic History  . Marital status: Married    Spouse name: Not on file  . Number of children: Not on file  . Years of education: Not on file  . Highest education level: Not on file  Occupational History  . Not on file  Tobacco Use  . Smoking status: Former Smoker    Packs/day: 0.25    Years: 70.00    Pack years: 17.50    Types: Cigarettes    Start date: 09/11/1944    Quit date: 02/17/2020    Years since quitting: 0.5  . Smokeless tobacco: Former Systems developer    Types: Snuff, Chew  Vaping Use  . Vaping Use: Never used  Substance and Sexual Activity  . Alcohol use: Not Currently    Alcohol/week: 0.0 standard drinks    Comment: 08/22/2013 "drink ~ 1 beer and wine/yr"  . Drug use: No  . Sexual activity: Never  Other Topics Concern  . Not on file  Social History Narrative   From [place]. Relocated to this area from [facility].   Social Determinants of Health   Financial Resource Strain: Not on file  Food Insecurity: Not on file  Transportation Needs: Not on file  Physical Activity: Not on file   Stress: Not on file  Social Connections: Not on file  Intimate Partner Violence: Not on file    Family History  Problem Relation Age of Onset  . Cancer Mother        uterine deceased  age 34  . Hypertension Sister   . Arrhythmia Other        Atrial fibrillation  . Hypertension Maternal Grandmother        Objective: Vitals:   08/23/20 1346  BP: (!) 142/74  Pulse: (!) 120  Temp: (!) 97.5 F (36.4 C)     Physical Exam  Lab Results:  No results for input(s): HGB, HCT, PLT in the last 72 hours.  Invalid input(s):  WBC BMET No results for input(s): NA, K, CL, CO2, GLUCOSE, BUN, CREATININE, CALCIUM in the last 72 hours. PSA 1.5 04/02/20  0.8 12/20 0.4 in 3/20.    Results for orders placed or performed in visit on 08/23/20 (from the past 24 hour(s))  Urinalysis, Routine w reflex microscopic     Status: None   Collection Time: 08/23/20  1:45 PM  Result Value Ref Range   Specific Gravity, UA 1.015 1.005 - 1.030   pH, UA 5.0 5.0 - 7.5   Color, UA Yellow Yellow   Appearance Ur Clear Clear   Leukocytes,UA Negative Negative   Protein,UA Negative Negative/Trace   Glucose, UA Negative Negative   Ketones, UA Negative Negative   RBC, UA Negative Negative   Bilirubin, UA Negative Negative   Urobilinogen, Ur 0.2 0.2 - 1.0 mg/dL   Nitrite, UA Negative Negative   Microscopic Examination Comment    Narrative   Performed at:  [facility] [address] [place], [place]  [id number] Lab Director: [place], Phone:  [phone]     Studies/Results:      Assessment & Plan: 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   He had no mets on the [place] PET but the PSA continues to rise with a <12 month PSADT.     Orders Placed This Encounter  Procedures  . Urinalysis, Routine w reflex microscopic  . PSA    Standing Status:   Future    Standing Expiration Date:   02/21/2021  . Testosterone    Standing Status:   Future     Standing Expiration Date:   02/21/2021      Return in about 6 years (around 08/23/2026) for Needs Lupron after 10/06/20 and then see him in April with a PSA and testosterone. .     CCJanora Norlander, DO    Irine Seal 08/24/2020

## 2020-08-22 NOTE — Telephone Encounter (Signed)
Attempted to contact patient - NA °

## 2020-08-22 NOTE — Telephone Encounter (Signed)
Wife aware and verbalizes understanding.  

## 2020-08-22 NOTE — Telephone Encounter (Signed)
Wife states that patient has been taking linzess 72mg  and it is giving him diarrhea.  Would like to know if Dr. Darnell Level thinks he should increase his stool softener above 200mg  of if there is something else she recommends?

## 2020-08-22 NOTE — Telephone Encounter (Signed)
Ok to switch to sample of trulance and see if that is gentler on his stomach.

## 2020-08-23 ENCOUNTER — Encounter: Payer: Self-pay | Admitting: Urology

## 2020-08-23 ENCOUNTER — Ambulatory Visit (INDEPENDENT_AMBULATORY_CARE_PROVIDER_SITE_OTHER): Payer: Medicare Other | Admitting: Urology

## 2020-08-23 ENCOUNTER — Other Ambulatory Visit: Payer: Self-pay

## 2020-08-23 VITALS — BP 142/74 | HR 120 | Temp 97.5°F | Ht 70.5 in | Wt 173.0 lb

## 2020-08-23 DIAGNOSIS — R9721 Rising PSA following treatment for malignant neoplasm of prostate: Secondary | ICD-10-CM | POA: Diagnosis not present

## 2020-08-23 DIAGNOSIS — R351 Nocturia: Secondary | ICD-10-CM | POA: Diagnosis not present

## 2020-08-23 DIAGNOSIS — C61 Malignant neoplasm of prostate: Secondary | ICD-10-CM

## 2020-08-23 LAB — URINALYSIS, ROUTINE W REFLEX MICROSCOPIC
Bilirubin, UA: NEGATIVE
Glucose, UA: NEGATIVE
Ketones, UA: NEGATIVE
Leukocytes,UA: NEGATIVE
Nitrite, UA: NEGATIVE
Protein,UA: NEGATIVE
RBC, UA: NEGATIVE
Specific Gravity, UA: 1.015 (ref 1.005–1.030)
Urobilinogen, Ur: 0.2 mg/dL (ref 0.2–1.0)
pH, UA: 5 (ref 5.0–7.5)

## 2020-08-23 NOTE — Progress Notes (Signed)
Urological Symptom Review  Patient is experiencing the following symptoms: Get up at night to urinate   Review of Systems  Gastrointestinal (upper)  : Negative for upper GI symptoms  Gastrointestinal (lower) : Diarrhea Constipation  Constitutional : Fatigue  Skin: Itching  Eyes: Negative for eye symptoms  Ear/Nose/Throat : Negative for Ear/Nose/Throat symptoms  Hematologic/Lymphatic: Negative for Hematologic/Lymphatic symptoms  Cardiovascular : Negative for cardiovascular symptoms  Respiratory : Negative for respiratory symptoms  Endocrine: Negative for endocrine symptoms  Musculoskeletal: Back pain Joint pain  Neurological: Negative for neurological symptoms  Psychologic: Depression Anxiety

## 2020-08-25 NOTE — Progress Notes (Signed)
Cardiology Office Note  Date: 08/26/2020   ID: Erik Mullins, DOB 1931-01-16, MRN [id number]  PCP:  Janora Norlander, DO  Cardiologist:  Rozann Lesches, MD Electrophysiologist:  None   Chief Complaint:  Persistent atrial fibrillation   History of Present Illness: Erik Mullins is a 86 y.o. male with a history of permanent atrial fibrillation, CKD IIIb, HTN.  Last encounter with Dr. Domenic Polite on 05/03/2020.  Plan was to continue heart rate control strategy and anticoagulation for atrial fibrillation.  She was continuing Lopressor and regularly adjusted Xarelto without bleeding.  CKD with creatinine 9.35 systolic blood pressures were in the 140s.  He was to continue lisinopril and Lopressor.   Patient is here for early follow-up.  Initially the patient wanted to be seen sooner due to decrease in renal function on lab work.  Labs on 08/06/2020 demonstrated increasing creatinine of 2.16 with GFR of 31.  Subsequent lab work on 08/20/2020 showed return to baseline of 1.83 creatinine and GFR of 38.  These labs are similar to  lab work 1 year ago on March 09, 2019 when creatinine was 1.84 and GFR was 38.  Patient denies any recent anginal or exertional symptoms, orthostatic symptoms, palpitations or arrhythmias, CVA or TIA-like symptoms, PND, orthopnea.  No bleeding.  No claudication-like symptoms, DVT or PE-like symptoms, or lower extremity edema.  States his left foot appears to be cold a fair amount of the time.  He denies any classic claudication-like symptoms.  Past Medical History:  Diagnosis Date  . AAA (abdominal aortic aneurysm) ([place])    Remote ~1994   . Anemia   . Arthritis   . Atrial fibrillation [facility])    Diagnosed December 2014  . AVNRT (AV nodal re-entry tachycardia) ([place])    Possible  . Cataract    Bilateral  . Cirrhosis ([place])   . CKD (chronic kidney disease) stage 3, GFR 30-59 ml/min (HCC)   . Essential hypertension   . Gout   . History of GI bleed   . History of gout    . History of hepatitis   . History of stroke   . Hyperlipidemia   . PAD (peripheral artery disease) (HCC)    Stenting in lower extremities (no records)  . Prostate cancer ([place])    XRT; in remission  . Type 2 diabetes mellitus ([place])     Past Surgical History:  Procedure Laterality Date  . ABDOMINAL AORTIC ANEURYSM REPAIR  1980's   in [place]  . APPENDECTOMY    . CAROTID ENDARTERECTOMY Right ~ 1999  . CATARACT EXTRACTION W/ INTRAOCULAR LENS  IMPLANT, BILATERAL Bilateral 2014  . CHOLECYSTECTOMY    . COLON SURGERY    . PARTIAL COLECTOMY     "I was bleeding to death inside; took out 2/3 of my colon" (08/22/2013)    Current Outpatient Medications  Medication Sig Dispense Refill  . acetaminophen (TYLENOL) 500 MG tablet Take 1,000 mg by mouth every 6 (six) hours as needed for headache.    . albuterol (VENTOLIN HFA) 108 (90 Base) MCG/ACT inhaler Inhale 1-2 puffs into the lungs every 6 (six) hours as needed for wheezing or shortness of breath. (Patient taking differently: Inhale 2 puffs into the lungs every 6 (six) hours as needed for wheezing or shortness of breath.) 18 g 2  . allopurinol (ZYLOPRIM) 100 MG tablet TAKE 1 TABLET BY MOUTH EVERY DAY 90 tablet 0  . Alpha-D-Galactosidase (BEANO ULTRA 800) TABS Take 1,600 mg by mouth daily  as needed (gas).    . betamethasone valerate ointment (VALISONE) 0.1 % Apply 1 application topically 2 (two) times daily. Avoid face, axilla and groin. (use as needed for psoriasis flares) 90 g 1  . Calcium Carbonate-Vitamin D 600-400 MG-UNIT tablet Take 1 tablet by mouth daily.    . Carboxymethylcellulose Sodium (ARTIFICIAL TEARS OP) Place 1 drop into both eyes daily as needed (dry eyes).    Marland Kitchen docusate sodium (COLACE) 100 MG capsule Take 100 mg by mouth 2 (two) times daily.    . memantine (NAMENDA) 5 MG tablet Take 1 tablet (5 mg total) by mouth 2 (two) times daily. (Patient taking differently: Take 2.5 mg by mouth 2 (two) times daily.) 180 tablet 3  .  metoprolol tartrate (LOPRESSOR) 25 MG tablet Take 25 mg by mouth 2 (two) times daily.     . mirtazapine (REMERON) 7.5 MG tablet Take 1 tablet (7.5 mg total) by mouth at bedtime. 30 tablet 1  . Multiple Vitamin (MULTIVITAMIN) tablet Take 1 tablet by mouth daily.    Marland Kitchen Plecanatide (TRULANCE) 3 MG TABS Take 3 mg by mouth daily.    Marland Kitchen umeclidinium-vilanterol (ANORO ELLIPTA) 62.5-25 MCG/INH AEPB Inhale 1 puff into the lungs daily. (Patient taking differently: Inhale 1 puff into the lungs daily as needed (shortness of breath).) 60 each 12  . XARELTO 15 MG TABS tablet TAKE 1 TABLET (15 MG TOTAL) BY MOUTH DAILY WITH SUPPER. 90 tablet 0  . furosemide (LASIX) 20 MG tablet Take 1 tablet (20 mg total) by mouth daily. 90 tablet 1   No current facility-administered medications for this visit.   Allergies:  Morphine   Social History: The patient  reports that he quit smoking about 6 months ago. His smoking use included cigarettes. He started smoking about 76 years ago. He has a 17.50 pack-year smoking history. He has quit using smokeless tobacco.  His smokeless tobacco use included snuff and chew. He reports previous alcohol use. He reports that he does not use drugs.   Family History: The patient's family history includes Arrhythmia in an other family member; Cancer in his mother; Hypertension in his maternal grandmother and sister.   ROS:  Please see the history of present illness. Otherwise, complete review of systems is positive for none.  All other systems are reviewed and negative.   Physical Exam: VS:  BP 112/78   Pulse 83   Ht 5' 11.5" (1.816 m)   Wt 177 lb (80.3 kg)   BMI 24.34 kg/m , BMI Body mass index is 24.34 kg/m.  Wt Readings from Last 3 Encounters:  08/26/20 177 lb (80.3 kg)  08/23/20 173 lb (78.5 kg)  08/06/20 170 lb 6.4 oz (77.3 kg)    General: Patient appears comfortable at rest. Neck: Supple, no elevated JVP or carotid bruits, no thyromegaly. Lungs: Clear to auscultation,  nonlabored breathing at rest. Cardiac: Regular rate and rhythm, no S3 or significant systolic murmur, no pericardial rub. Extremities: No pitting edema, distal pulses 2+. Skin: Warm and dry. Musculoskeletal: No kyphosis. Neuropsychiatric: Alert and oriented x3, affect grossly appropriate.  ECG:  EKG October 16, 2019 showed rate controlled atrial fibrillation rate of 83  Recent Labwork: 07/09/2020: ALT 11; AST 21; Hemoglobin 15.3; Platelets 187 08/20/2020: BUN 30; Creatinine, Ser 1.83; Potassium 4.8; Sodium 142     Component Value Date/Time   CHOL 194 11/29/2017 0905   CHOL 122 02/07/2013 0903   TRIG 187 (H) 11/29/2017 0905   TRIG 168 (H) 10/24/2013 1540  TRIG 140 02/07/2013 0903   HDL 58 11/29/2017 0905   HDL 44 10/24/2013 0909   HDL 41 02/07/2013 0903   CHOLHDL 3.3 11/29/2017 0905   LDLCALC 99 11/29/2017 0905   LDLCALC 69 10/24/2013 0909   LDLCALC 53 02/07/2013 0903    Other Studies Reviewed Today:  Echocardiogram 08/23/2013: Study Conclusions  Left ventricle: The cavity size was normal. Wall thickness was normal. Systolic function was normal. The estimated ejection fraction was in the range of 60% to 65%.   Assessment and Plan:  1. Permanent atrial fibrillation (HCC)   2. Stage 3b chronic kidney disease ([place])   3. Essential hypertension, benign    1. Permanent atrial fibrillation (HCC) Permanent atrial fibrillation with rate control.  Pulses irregularly irregular with a rate of 83 today.  Continue continue metoprolol 25 mg p.o. twice daily.  Continue Xarelto 15 mg p.o. daily  2. Stage 3b chronic kidney disease (HCC) Recent creatinine 1.83 with GFR of 38.  This appears to be his baseline based on blood work 1 year ago in June when creatinine was 1.84 and GFR was 38.  3. Essential hypertension, benign Blood pressure well controlled on current medications.  Continue metoprolol 25 mg p.o. twice daily.  Please refill Lasix.  Continue Lasix 20 mg daily.  Medication  Adjustments/Labs and Tests Ordered: Current medicines are reviewed at length with the patient today.  Concerns regarding medicines are outlined above.   Disposition: Follow-up with Dr. Domenic Polite or APP 6 months.  Signed, Levell July, NP 08/26/2020 12:09 PM    [facility] [place], [place], [place] [address] Phone: [phone]; Fax: [phone]

## 2020-08-26 ENCOUNTER — Other Ambulatory Visit: Payer: Self-pay

## 2020-08-26 ENCOUNTER — Encounter: Payer: Self-pay | Admitting: Family Medicine

## 2020-08-26 ENCOUNTER — Ambulatory Visit (INDEPENDENT_AMBULATORY_CARE_PROVIDER_SITE_OTHER): Payer: Medicare Other | Admitting: Family Medicine

## 2020-08-26 VITALS — BP 112/78 | HR 83 | Ht 71.5 in | Wt 177.0 lb

## 2020-08-26 DIAGNOSIS — I10 Essential (primary) hypertension: Secondary | ICD-10-CM | POA: Diagnosis not present

## 2020-08-26 DIAGNOSIS — I4821 Permanent atrial fibrillation: Secondary | ICD-10-CM

## 2020-08-26 DIAGNOSIS — N1832 Chronic kidney disease, stage 3b: Secondary | ICD-10-CM | POA: Diagnosis not present

## 2020-08-26 MED ORDER — FUROSEMIDE 20 MG PO TABS: 20.0000 mg | ORAL_TABLET | Freq: Every day | ORAL | 1 refills | Status: DC

## 2020-08-26 NOTE — Patient Instructions (Signed)
Your physician wants you to follow-up in: [place]   Your physician recommends that you continue on your current medications as directed. Please refer to the Current Medication list given to you today.  Thank you for choosing [facility]!!

## 2020-08-29 ENCOUNTER — Other Ambulatory Visit: Payer: Self-pay | Admitting: Family Medicine

## 2020-08-29 DIAGNOSIS — F418 Other specified anxiety disorders: Secondary | ICD-10-CM

## 2020-08-29 NOTE — Telephone Encounter (Signed)
Last office visit 08/06/20  Last refill 08/06/20, #30, 1 refill

## 2020-09-08 NOTE — Progress Notes (Signed)
Radiation Oncology         [phone] ________________________________  Name: Erik Mullins MRN: [id number]  Date: 09/09/2020  DOB: September 10, 1931  Follow-Up Visit Note  CC: Janora Norlander, DO  Derek Jack, MD    ICD-10-CM   1. Squamous cell lung cancer, left (HCC)  C34.92   2. Lung nodule  R91.1     Diagnosis: Left upper lobe squamous cell carcinoma, clinical stage I  Interval Since Last Radiation: One month and four days  Radiation Treatment Dates: 07/30/2020 through 08/06/2020 Site Technique Total Dose (Gy) Dose per Fx (Gy) Completed Fx Beam Energies  Lung, Left: Lung_Lt_upper l IMRT 54/54 18 3/3 6XFFF    Narrative:  The patient returns today for routine follow-up. No significant interval history since the end of treatment.  On review of systems, he reports having a good appetite. He denies any pain, shortness of breath, and cough.  ALLERGIES:  is allergic to morphine.  Meds: Current Outpatient Medications  Medication Sig Dispense Refill  . acetaminophen (TYLENOL) 500 MG tablet Take 1,000 mg by mouth every 6 (six) hours as needed for headache.    . albuterol (VENTOLIN HFA) 108 (90 Base) MCG/ACT inhaler Inhale 1-2 puffs into the lungs every 6 (six) hours as needed for wheezing or shortness of breath. (Patient taking differently: Inhale 2 puffs into the lungs every 6 (six) hours as needed for wheezing or shortness of breath.) 18 g 2  . allopurinol (ZYLOPRIM) 100 MG tablet TAKE 1 TABLET BY MOUTH EVERY DAY 90 tablet 0  . Alpha-D-Galactosidase (BEANO ULTRA 800) TABS Take 1,600 mg by mouth daily as needed (gas).    . betamethasone valerate ointment (VALISONE) 0.1 % Apply 1 application topically 2 (two) times daily. Avoid face, axilla and groin. (use as needed for psoriasis flares) 90 g 1  . Calcium Carbonate-Vitamin D 600-400 MG-UNIT tablet Take 1 tablet by mouth daily.    . Carboxymethylcellulose Sodium (ARTIFICIAL TEARS OP) Place 1 drop into both eyes daily as  needed (dry eyes).    Marland Kitchen docusate sodium (COLACE) 100 MG capsule Take 100 mg by mouth 2 (two) times daily.    . furosemide (LASIX) 20 MG tablet Take 1 tablet (20 mg total) by mouth daily. 90 tablet 1  . memantine (NAMENDA) 5 MG tablet Take 1 tablet (5 mg total) by mouth 2 (two) times daily. (Patient taking differently: Take 2.5 mg by mouth 2 (two) times daily.) 180 tablet 3  . metoprolol tartrate (LOPRESSOR) 25 MG tablet Take 25 mg by mouth 2 (two) times daily.     . mirtazapine (REMERON) 7.5 MG tablet TAKE 1 TABLET BY MOUTH AT BEDTIME. 90 tablet 1  . Multiple Vitamin (MULTIVITAMIN) tablet Take 1 tablet by mouth daily.    Marland Kitchen Plecanatide (TRULANCE) 3 MG TABS Take 3 mg by mouth daily.    Marland Kitchen umeclidinium-vilanterol (ANORO ELLIPTA) 62.5-25 MCG/INH AEPB Inhale 1 puff into the lungs daily. (Patient taking differently: Inhale 1 puff into the lungs daily as needed (shortness of breath).) 60 each 12  . XARELTO 15 MG TABS tablet TAKE 1 TABLET (15 MG TOTAL) BY MOUTH DAILY WITH SUPPER. 90 tablet 0   No current facility-administered medications for this encounter.    Physical Findings: The patient is in no acute distress. Patient is alert and oriented.  height is 5' 10.5" (1.791 m) and weight is 178 lb 9.6 oz (81 kg). His temperature is 98.4 F (36.9 C). His blood pressure is 124/70 and his pulse is  72. His respiration is 20 and oxygen saturation is 97%.    Lungs are clear to auscultation bilaterally. Heart has regular rate and rhythm. No palpable cervical, supraclavicular, or axillary adenopathy. Abdomen soft, non-tender, normal bowel sounds.   Lab Findings: Lab Results  Component Value Date   WBC 7.4 07/09/2020   HGB 15.3 07/09/2020   HCT 45.7 07/09/2020   MCV 88 07/09/2020   PLT 187 07/09/2020    Radiographic Findings: No results found.  Impression: Left upper lobe squamous cell carcinoma, clinical stage I  The patient tolerated his SBRT well.  He reports no side effects from his  treatment.  Plan: The patient is scheduled to follow-up with Dr. Delton Coombes on 11/07/2020. He will follow up with radiation oncology  soon after Dr. Tomie China appointment and the patient's chest CT scan on February 28.    ____________________________________   Blair Promise, PhD, MD  This document serves as a record of services personally performed by Gery Pray, MD. It was created on his behalf by Clerance Lav, a trained medical scribe. The creation of this record is based on the scribe's personal observations and the provider's statements to them. This document has been checked and approved by the attending provider.

## 2020-09-09 ENCOUNTER — Other Ambulatory Visit: Payer: Self-pay

## 2020-09-09 ENCOUNTER — Encounter: Payer: Self-pay | Admitting: Radiation Oncology

## 2020-09-09 ENCOUNTER — Ambulatory Visit
Admission: RE | Admit: 2020-09-09 | Discharge: 2020-09-09 | Disposition: A | Discharge status: Home / Self Care | Payer: Medicare Other | Source: Ambulatory Visit | Attending: Radiation Oncology | Admitting: Radiation Oncology

## 2020-09-09 VITALS — BP 124/70 | HR 72 | Temp 98.4°F | Resp 20 | Ht 70.5 in | Wt 178.6 lb

## 2020-09-09 DIAGNOSIS — Z923 Personal history of irradiation: Secondary | ICD-10-CM | POA: Insufficient documentation

## 2020-09-09 DIAGNOSIS — Z79899 Other long term (current) drug therapy: Secondary | ICD-10-CM | POA: Insufficient documentation

## 2020-09-09 DIAGNOSIS — Z7901 Long term (current) use of anticoagulants: Secondary | ICD-10-CM | POA: Insufficient documentation

## 2020-09-09 DIAGNOSIS — C3412 Malignant neoplasm of upper lobe, left bronchus or lung: Secondary | ICD-10-CM | POA: Insufficient documentation

## 2020-09-09 DIAGNOSIS — C3492 Malignant neoplasm of unspecified part of left bronchus or lung: Secondary | ICD-10-CM

## 2020-09-09 DIAGNOSIS — R911 Solitary pulmonary nodule: Secondary | ICD-10-CM

## 2020-09-09 NOTE — Progress Notes (Incomplete)
  Patient Name: Erik Mullins MRN: [id number] DOB: [birth year] Referring Physician: Derek Jack Date of Service: 08/06/2020 [facility], [place]                                                        End Of Treatment Note  Diagnoses: C34.12-Malignant neoplasm of upper lobe, left bronchus or lung  Cancer Staging: Left upper lobe squamous cell carcinoma,clinical stage I  Intent: Curative  Radiation Treatment Dates: 07/30/2020 through 08/06/2020 Site Technique Total Dose (Gy) Dose per Fx (Gy) Completed Fx Beam Energies  Lung, Left: Lung_Lt_upper l IMRT 54/54 18 3/3 6XFFF   Narrative: The patient tolerated radiation therapy quite well. He did not report any radiation-related side effects.   Plan: The patient will follow-up with radiation oncology in one month.  ________________________________________________   Blair Promise, PhD, MD  This document serves as a record of services personally performed by Gery Pray, MD. It was created on his behalf by Clerance Lav, a trained medical scribe. The creation of this record is based on the scribe's personal observations and the provider's statements to them. This document has been checked and approved by the attending provider.

## 2020-09-09 NOTE — Progress Notes (Addendum)
Patient here for a 1 month f/u with Dr. Sondra Come. Patient denies any pain, shortness of breath or a cough. Patient reports a good appetite.  BP 124/70 (BP Location: Right Arm, Patient Position: Sitting, Cuff Size: Normal)   Pulse 72   Temp 98.4 F (36.9 C)   Resp 20   Ht 5' 10.5" (1.791 m)   Wt 178 lb 9.6 oz (81 kg)   SpO2 97%   BMI 25.26 kg/m   Wt Readings from Last 3 Encounters:  09/09/20 178 lb 9.6 oz (81 kg)  08/26/20 177 lb (80.3 kg)  08/23/20 173 lb (78.5 kg)

## 2020-10-01 ENCOUNTER — Encounter: Payer: Self-pay | Admitting: *Deleted

## 2020-10-03 ENCOUNTER — Encounter: Payer: Self-pay | Admitting: Urology

## 2020-10-03 ENCOUNTER — Telehealth: Payer: Self-pay | Admitting: Cardiology

## 2020-10-03 ENCOUNTER — Other Ambulatory Visit: Payer: Self-pay

## 2020-10-03 ENCOUNTER — Ambulatory Visit: Payer: Medicare Other

## 2020-10-03 VITALS — BP 119/91 | HR 76 | Temp 97.6°F | Ht 70.0 in | Wt 177.0 lb

## 2020-10-03 DIAGNOSIS — C61 Malignant neoplasm of prostate: Secondary | ICD-10-CM

## 2020-10-03 LAB — URINALYSIS, ROUTINE W REFLEX MICROSCOPIC
Bilirubin, UA: NEGATIVE
Glucose, UA: NEGATIVE
Ketones, UA: NEGATIVE
Leukocytes,UA: NEGATIVE
Nitrite, UA: NEGATIVE
Protein,UA: NEGATIVE
RBC, UA: NEGATIVE
Specific Gravity, UA: 1.015 (ref 1.005–1.030)
Urobilinogen, Ur: 0.2 mg/dL (ref 0.2–1.0)
pH, UA: 6 (ref 5.0–7.5)

## 2020-10-03 MED ORDER — METOPROLOL TARTRATE 25 MG PO TABS: 25.0000 mg | ORAL_TABLET | Freq: Two times a day (BID) | ORAL | 3 refills | Status: DC

## 2020-10-03 MED ORDER — LEUPROLIDE ACETATE (6 MONTH) 45 MG IM KIT
45.0000 mg | PACK | Freq: Once | INTRAMUSCULAR | Status: AC
  Administered 2020-10-03: 45 mg via INTRAMUSCULAR

## 2020-10-03 NOTE — Progress Notes (Signed)
Lupron IM Injection   Due to Prostate Cancer patient is present today for a Lupron Injection.  Medication: Lupron 6 month Dose: 45 mg  Location: right upper outer buttocks Lot: [id number] Exp: [id number]  Patient tolerated well, no complications were noted  Performed by: Kimbly Eanes,lpn   Follow up: Keep next scheduled OV

## 2020-10-03 NOTE — Progress Notes (Signed)
Urological Symptom Review  Patient is experiencing the following symptoms: Get up at night to urinate   Review of Systems  Gastrointestinal (upper)  : Negative for upper GI symptoms  Gastrointestinal (lower) : Constipation  Constitutional : Negative for symptoms  Skin: Skin rash/lesion  Eyes: Negative for eye symptoms  Ear/Nose/Throat : Negative for Ear/Nose/Throat symptoms  Hematologic/Lymphatic: Negative for Hematologic/Lymphatic symptoms  Cardiovascular : Negative for cardiovascular symptoms  Respiratory : Shortness of breath  Endocrine: Negative for endocrine symptoms  Musculoskeletal: Negative for musculoskeletal symptoms  Neurological: Negative for neurological symptoms  Psychologic: Negative for psychiatric symptoms

## 2020-10-03 NOTE — Progress Notes (Signed)
Subjective:  1. Prostate cancer (HCC)     08/23/20: Erik Mullins returns today in f/u.  He was found to have lung cancer at his last visit and has subsequently been treated with radiation therapy.    His PSA continues to rise and was 2.0 on 08/20/20 which is up from 1.5 in 7/21 and 0.8 in 12/20.  He T was castrate in 04/02/20.  He is voiding well but has stable nocturia 2-3x.  He has no bone pain or weight loss.    05/24/20: Erik Mullins returns today in f/u from an Axumin PET done for his history of prostate cancer that was diagnosed in 4/10 and he completed radiation therapy on 08/16/09.  He had gleason 9 disease intiallly.  He had a PSA recurrence and was started on Lupron in 2017 after his PSA rose to 9.9 in 6/17 from 3.6 in 12/16.  A bone scan and CT in 7/17 showed no mets.   His PSA nadired at 0.2 on Lupron in 9/18 but has been rising and was 0.4 in 3/20, 0.8 in 12/20 and 1.5 on 04/02/20.  His testosterone has remained castrate.  His last Lupron injection was on 04/05/20.   He is doing well without other associated signs or symptoms.  He is voiding well with an IPSS of 3-4 with nocturia x 2-3.  He drinks a lot of coffee prior to bed.  He has no bone pain or weight loss.   He has no hematuria.     The PET scan showed a 1cm nodule in the left upper lobe that was hypermetabolic and could be prostatic or a bronchogenic carcinoma.  Tissue sampling was recommended.  There was some diffuse uptake in the prostate as well.       ROS:  ROS  Allergies  Allergen Reactions  . Morphine Other (See Comments)    hallucinate    Outpatient Encounter Medications as of 10/03/2020  Medication Sig Note  . acetaminophen (TYLENOL) 500 MG tablet Take 1,000 mg by mouth every 6 (six) hours as needed for headache.   . albuterol (VENTOLIN HFA) 108 (90 Base) MCG/ACT inhaler Inhale 1-2 puffs into the lungs every 6 (six) hours as needed for wheezing or shortness of breath. (Patient taking differently: Inhale 2 puffs into the lungs  every 6 (six) hours as needed for wheezing or shortness of breath.)   . allopurinol (ZYLOPRIM) 100 MG tablet TAKE 1 TABLET BY MOUTH EVERY DAY   . Alpha-D-Galactosidase (BEANO ULTRA 800) TABS Take 1,600 mg by mouth daily as needed (gas).   . betamethasone valerate ointment (VALISONE) 0.1 % Apply 1 application topically 2 (two) times daily. Avoid face, axilla and groin. (use as needed for psoriasis flares)   . Calcium Carbonate-Vitamin D 600-400 MG-UNIT tablet Take 1 tablet by mouth daily.   . Carboxymethylcellulose Sodium (ARTIFICIAL TEARS OP) Place 1 drop into both eyes daily as needed (dry eyes).   Marland Kitchen docusate sodium (COLACE) 100 MG capsule Take 100 mg by mouth 2 (two) times daily.   . furosemide (LASIX) 20 MG tablet Take 1 tablet (20 mg total) by mouth daily.   . memantine (NAMENDA) 5 MG tablet Take 1 tablet (5 mg total) by mouth 2 (two) times daily. (Patient taking differently: Take 2.5 mg by mouth 2 (two) times daily.)   . mirtazapine (REMERON) 7.5 MG tablet TAKE 1 TABLET BY MOUTH AT BEDTIME.   . Multiple Vitamin (MULTIVITAMIN) tablet Take 1 tablet by mouth daily.   Marland Kitchen umeclidinium-vilanterol (ANORO ELLIPTA)  62.5-25 MCG/INH AEPB Inhale 1 puff into the lungs daily. (Patient taking differently: Inhale 1 puff into the lungs daily as needed (shortness of breath).)   . XARELTO 15 MG TABS tablet TAKE 1 TABLET (15 MG TOTAL) BY MOUTH DAILY WITH SUPPER.   . metoprolol tartrate (LOPRESSOR) 25 MG tablet Take 25 mg by mouth 2 (two) times daily.    Marland Kitchen Plecanatide (TRULANCE) 3 MG TABS Take 3 mg by mouth daily. (Patient not taking: Reported on 10/03/2020) 08/22/2020: Samples given    No facility-administered encounter medications on file as of 10/03/2020.    Past Medical History:  Diagnosis Date  . AAA (abdominal aortic aneurysm) (HCC)    Remote ~1994   . Anemia   . Arthritis   . Atrial fibrillation [facility])    Diagnosed December 2014  . AVNRT (AV nodal re-entry tachycardia) (HCC)    Possible  . Cataract     Bilateral  . Cirrhosis (HCC)   . CKD (chronic kidney disease) stage 3, GFR 30-59 ml/min (HCC)   . Essential hypertension   . Gout   . History of GI bleed   . History of gout   . History of hepatitis   . History of stroke   . Hyperlipidemia   . PAD (peripheral artery disease) (HCC)    Stenting in lower extremities (no records)  . Prostate cancer (HCC)    XRT; in remission  . Type 2 diabetes mellitus (HCC)     Past Surgical History:  Procedure Laterality Date  . ABDOMINAL AORTIC ANEURYSM REPAIR  1980's   in [place]  . APPENDECTOMY    . CAROTID ENDARTERECTOMY Right ~ 1999  . CATARACT EXTRACTION W/ INTRAOCULAR LENS  IMPLANT, BILATERAL Bilateral 2014  . CHOLECYSTECTOMY    . COLON SURGERY    . PARTIAL COLECTOMY     "I was bleeding to death inside; took out 2/3 of my colon" (08/22/2013)    Social History   Socioeconomic History  . Marital status: Married    Spouse name: Not on file  . Number of children: Not on file  . Years of education: Not on file  . Highest education level: Not on file  Occupational History  . Not on file  Tobacco Use  . Smoking status: Former Smoker    Packs/day: 0.25    Years: 70.00    Pack years: 17.50    Types: Cigarettes    Start date: 09/11/1944    Quit date: 02/17/2020    Years since quitting: 0.6  . Smokeless tobacco: Former Neurosurgeon    Types: Snuff, Chew  Vaping Use  . Vaping Use: Never used  Substance and Sexual Activity  . Alcohol use: Not Currently    Alcohol/week: 0.0 standard drinks    Comment: 08/22/2013 "drink ~ 1 beer and wine/yr"  . Drug use: No  . Sexual activity: Never  Other Topics Concern  . Not on file  Social History Narrative   From [place]. Relocated to this area from [facility].   Social Determinants of Health   Financial Resource Strain: Not on file  Food Insecurity: Not on file  Transportation Needs: Not on file  Physical Activity: Not on file  Stress: Not on file  Social Connections: Not on  file  Intimate Partner Violence: Not on file    Family History  Problem Relation Age of Onset  . Cancer Mother        uterine deceased age 28  . Hypertension Sister   .  Arrhythmia Other        Atrial fibrillation  . Hypertension Maternal Grandmother        Objective: Vitals:   10/03/20 1114  BP: (!) 119/91  Pulse: 76  Temp: 97.6 F (36.4 C)     Physical Exam  Lab Results:  No results for input(s): HGB, HCT, PLT in the last 72 hours.  Invalid input(s):  WBC BMET No results for input(s): NA, K, CL, CO2, GLUCOSE, BUN, CREATININE, CALCIUM in the last 72 hours. PSA 1.5 04/02/20  0.8 12/20 0.4 in 3/20.    No results found for this or any previous visit (from the past 24 hour(s)).   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   He had no mets on the Axumin PET but the PSA continues to rise with a <12 month PSADT.     Orders Placed This Encounter  Procedures  . Urinalysis, Routine w reflex microscopic      No follow-ups on file.     CC: Raliegh Ip, [place]    Bjorn Pippin 10/03/2020 Patient ID: Erik Mullins, male   DOB: 04/28/1931, 87 y.o.   MRN: [id number]

## 2020-10-03 NOTE — Telephone Encounter (Signed)
*  STAT* If patient is at the pharmacy, call can be transferred to refill team.   1. Which medications need to be refilled? (please list name of each medication and dose if known) metoprolol tartrate (LOPRESSOR) takes 1 - 25 MG tablet 2x a day  2. Which pharmacy/location (including street and city if local pharmacy) is medication to be sent to? [facility]  3. Do they need a 30 day or 90 day supply? 90 Day  Patient was previously prescribed this medication but he was in hospital in Nov 2021 and the Dr. Increased this medication. They need a new refill prescribed under Dr. Domenic Polite.

## 2020-10-03 NOTE — Telephone Encounter (Signed)
Done

## 2020-10-03 NOTE — Patient Instructions (Addendum)
Hormone Suppression Therapy for Prostate Cancer  Hormone suppression therapy is a treatment that can help to slow the growth of cancer cells in the prostate gland. It is also called androgen deprivation therapy (ADT) or androgen suppression therapy. Hormone suppression therapy targets hormones in the body that help cancer cells grow-these hormones are called androgens. Hormone suppression therapy alone will not cure prostate cancer, but it can slow the growth of cancer cells and may shrink tumors over time. Your health care provider can help you find the best treatment that fits your lifestyle. Hormone suppression therapy may be used in the following cases:  When prostate cancer has spread too far to other places in the body and cannot be cured by surgery or radiation.  When a person has health problems that prevent them from having surgery or radiation.  Before radiation to help shrink the size of the cancer and make the radiation treatment more effective.  If the prostate cancer remains or comes back following treatment with surgery or radiation. What are the types of hormone suppression therapy? Orchiectomy Orchiectomy, also called surgical castration, is a surgery to remove one or both testicles. The testicles make the two main androgens-testosterone and dihydrotestosterone. This surgery reduces the levels of testosterone in the blood, leading to decreased androgen production. Medicine therapy Medicine therapy, also called medical or chemical castration, involves taking medicines to keep your body from making or using androgens. Medicines can do this in one of three ways: 1. Reducing androgen production by the testicles: ? Luteinizing hormone-releasing hormone [facility]) agonist medicines. These medicines are injected or implanted under your skin to lower the amount of androgens that your testicles make. If you take these medicines, you may also be prescribed other medicines to help with side  effects. ? LHRH antagonist medicines. These medicines also work to lower the amount of androgens made in the testicles but they work faster than [facility] agonist medicines and have less severe side effects. They are given as a monthly injection under the skin, and they are used when prostate cancer is in an advanced stage. ? Estrogens (male hormones). These medicines help reduce androgen production by the testicles. Usually, other types of hormone suppression therapy are used first based. If they do not work well or if they stop working, then estrogen may be given. 2. Blocking androgen attachment throughout the body. ? Anti-androgen medicines, also called androgen receptor antagonists, block areas on the body where androgens attach. These are pills that are usually used in combination with other types of hormone suppression therapy, like orchiectomy and other medicines. 3. Blocking androgen production throughout the body. ? Androgen synthesis inhibitor medicines. These medicines help to stop other areas of the body from making androgens. They are taken as pills. They may be used if the prostate cancer is advanced and has not gotten better with surgery or other medicines. A steroid medicine may be given with this type of medicine to help with side effects. What are the risks? Hormone suppression therapy may cause side effects, including:  Hot flashes.  Sexual side effects: ? Decrease or lack of sexual desire. ? Decrease in size of penis or testicles. ? Inability to get an erection (erectile dysfunction, or impotence). ? Breast tenderness or increase in breast size.  Fatigue.  Weight gain.  Thinning of the bones (osteoporosis) or loss of muscle mass.  Depression.  Increased cholesterol levels.  Trouble with thinking or focusing. Hormone suppression therapy may also increase your risk of high blood pressure (  hypertension), stroke, heart attack, or diabetes. What are the benefits? One of the  main benefits of hormone suppression therapy is having additional treatment options. You may have only one type of treatment or two or more types at the same time. Treatments may be combined to:  Help with side effects.  Treat advanced cancer. Contact a health care provider if:  You have pain or side effects that do not get better with treatment.  You have trouble urinating.  You have new side effects that do not go away. Get help right away if:  You have severe chest pain.  You have trouble breathing.  You have an irregular heartbeat.  You have numbness or paralysis in the lower half of your body.  You are confused.  You have trouble talking or understanding speech. These symptoms may be an emergency. Do not wait to see if the symptoms will go away. Get medical help right away. Call your local emergency services (911 in the [place] Do not drive yourself to the hospital. Summary  Hormone suppression therapy is a treatment that can help to slow the growth of cancer cells in the prostate (prostate gland).  Hormone suppression therapy alone will not cure prostate cancer, but it can slow the growth of prostate cancer and may shrink tumors over time.  Treatment to suppress hormones may include surgery or medicines. This information is not intended to replace advice given to you by your health care provider. Make sure you discuss any questions you have with your health care provider. Document Revised: 07/27/2019 Document Reviewed: 07/27/2019 Elsevier Patient Education  2021 [employer].

## 2020-10-22 ENCOUNTER — Ambulatory Visit: Payer: Medicare Other | Admitting: Cardiology

## 2020-11-04 ENCOUNTER — Other Ambulatory Visit: Payer: Self-pay

## 2020-11-04 ENCOUNTER — Ambulatory Visit (HOSPITAL_COMMUNITY)
Admission: RE | Admit: 2020-11-04 | Discharge: 2020-11-04 | Disposition: A | Discharge status: Home / Self Care | Payer: Medicare Other | Source: Ambulatory Visit | Attending: Hematology | Admitting: Hematology

## 2020-11-04 ENCOUNTER — Inpatient Hospital Stay (HOSPITAL_COMMUNITY): Payer: Medicare Other | Attending: Hematology

## 2020-11-04 DIAGNOSIS — C3492 Malignant neoplasm of unspecified part of left bronchus or lung: Secondary | ICD-10-CM

## 2020-11-04 DIAGNOSIS — C3412 Malignant neoplasm of upper lobe, left bronchus or lung: Secondary | ICD-10-CM | POA: Insufficient documentation

## 2020-11-04 DIAGNOSIS — I2510 Atherosclerotic heart disease of native coronary artery without angina pectoris: Secondary | ICD-10-CM | POA: Diagnosis not present

## 2020-11-04 DIAGNOSIS — C61 Malignant neoplasm of prostate: Secondary | ICD-10-CM | POA: Diagnosis not present

## 2020-11-04 DIAGNOSIS — N189 Chronic kidney disease, unspecified: Secondary | ICD-10-CM | POA: Diagnosis not present

## 2020-11-04 DIAGNOSIS — J432 Centrilobular emphysema: Secondary | ICD-10-CM | POA: Diagnosis not present

## 2020-11-04 DIAGNOSIS — Z993 Dependence on wheelchair: Secondary | ICD-10-CM | POA: Diagnosis not present

## 2020-11-04 DIAGNOSIS — Z85118 Personal history of other malignant neoplasm of bronchus and lung: Secondary | ICD-10-CM | POA: Diagnosis not present

## 2020-11-04 DIAGNOSIS — Z923 Personal history of irradiation: Secondary | ICD-10-CM | POA: Diagnosis not present

## 2020-11-04 DIAGNOSIS — Z87891 Personal history of nicotine dependence: Secondary | ICD-10-CM | POA: Insufficient documentation

## 2020-11-04 DIAGNOSIS — I358 Other nonrheumatic aortic valve disorders: Secondary | ICD-10-CM | POA: Diagnosis not present

## 2020-11-04 LAB — CBC WITH DIFFERENTIAL/PLATELET
Abs Immature Granulocytes: 0.04 10*3/uL (ref 0.00–0.07)
Basophils Absolute: 0.1 10*3/uL (ref 0.0–0.1)
Basophils Relative: 1 %
Eosinophils Absolute: 0.2 10*3/uL (ref 0.0–0.5)
Eosinophils Relative: 3 %
HCT: 45 % (ref 39.0–52.0)
Hemoglobin: 14.2 g/dL (ref 13.0–17.0)
Immature Granulocytes: 1 %
Lymphocytes Relative: 14 %
Lymphs Abs: 1 10*3/uL (ref 0.7–4.0)
MCH: 29.9 pg (ref 26.0–34.0)
MCHC: 31.6 g/dL (ref 30.0–36.0)
MCV: 94.7 fL (ref 80.0–100.0)
Monocytes Absolute: 0.5 10*3/uL (ref 0.1–1.0)
Monocytes Relative: 7 %
Neutro Abs: 5.3 10*3/uL (ref 1.7–7.7)
Neutrophils Relative %: 74 %
Platelets: 164 10*3/uL (ref 150–400)
RBC: 4.75 MIL/uL (ref 4.22–5.81)
RDW: 13.7 % (ref 11.5–15.5)
WBC: 7.2 10*3/uL (ref 4.0–10.5)
nRBC: 0 % (ref 0.0–0.2)

## 2020-11-04 LAB — COMPREHENSIVE METABOLIC PANEL
ALT: 14 U/L (ref 0–44)
AST: 21 U/L (ref 15–41)
Albumin: 3.9 g/dL (ref 3.5–5.0)
Alkaline Phosphatase: 87 U/L (ref 38–126)
Anion gap: 8 (ref 5–15)
BUN: 32 mg/dL — ABNORMAL HIGH (ref 8–23)
CO2: 29 mmol/L (ref 22–32)
Calcium: 9.7 mg/dL (ref 8.9–10.3)
Chloride: 102 mmol/L (ref 98–111)
Creatinine, Ser: 2 mg/dL — ABNORMAL HIGH (ref 0.61–1.24)
GFR, Estimated: 32 mL/min — ABNORMAL LOW (ref >60)
Glucose, Bld: 110 mg/dL — ABNORMAL HIGH (ref 70–99)
Potassium: 4.9 mmol/L (ref 3.5–5.1)
Sodium: 139 mmol/L (ref 135–145)
Total Bilirubin: 0.6 mg/dL (ref 0.3–1.2)
Total Protein: 7.2 g/dL (ref 6.5–8.1)

## 2020-11-07 ENCOUNTER — Other Ambulatory Visit: Payer: Self-pay

## 2020-11-07 ENCOUNTER — Encounter: Payer: Self-pay | Admitting: *Deleted

## 2020-11-07 ENCOUNTER — Inpatient Hospital Stay (HOSPITAL_BASED_OUTPATIENT_CLINIC_OR_DEPARTMENT_OTHER): Payer: Medicare Other | Admitting: Hematology

## 2020-11-07 VITALS — BP 131/96 | HR 86 | Temp 97.9°F | Resp 17 | Wt 179.2 lb

## 2020-11-07 DIAGNOSIS — C61 Malignant neoplasm of prostate: Secondary | ICD-10-CM | POA: Diagnosis not present

## 2020-11-07 DIAGNOSIS — Z993 Dependence on wheelchair: Secondary | ICD-10-CM | POA: Diagnosis not present

## 2020-11-07 DIAGNOSIS — C3492 Malignant neoplasm of unspecified part of left bronchus or lung: Secondary | ICD-10-CM

## 2020-11-07 DIAGNOSIS — Z923 Personal history of irradiation: Secondary | ICD-10-CM | POA: Diagnosis not present

## 2020-11-07 DIAGNOSIS — C3412 Malignant neoplasm of upper lobe, left bronchus or lung: Secondary | ICD-10-CM | POA: Diagnosis not present

## 2020-11-07 DIAGNOSIS — N189 Chronic kidney disease, unspecified: Secondary | ICD-10-CM | POA: Diagnosis not present

## 2020-11-07 DIAGNOSIS — Z87891 Personal history of nicotine dependence: Secondary | ICD-10-CM | POA: Diagnosis not present

## 2020-11-07 NOTE — Progress Notes (Signed)
Erik Mullins, Erik Mullins [id number]   CLINIC:  Medical Oncology/Hematology  PCP:  Janora Norlander, DO [address] [place] [address] [phone]   REASON FOR VISIT:  Follow-up for left squamous cell lung cancer  PRIOR THERAPY: SBRT to left upper lung 54 Gy in 3 fractions through 08/06/2020  NGS Results: Not done  CURRENT THERAPY: Surveillance  BRIEF ONCOLOGIC HISTORY:  Oncology History   No history exists.    CANCER STAGING: Cancer Staging No matching staging information was found for the patient.  INTERVAL HISTORY:  Erik Mullins, a 87 y.o. male, returns for routine follow-up of his left squamous cell lung cancer. [place] was last seen on 07/10/2020.   Today he is accompanied by his wife and he reports feeling okay. His breathing has slightly improved since finishing radiation. He uses a rescue inhaler several times a day and is able to do some of his chores, though he gets SOB when he has to mildly exert himself. He takes Lasix daily and has not been drinking enough water recently.   REVIEW OF SYSTEMS:  Review of Systems  Constitutional: Positive for fatigue (25%). Negative for appetite change.  HENT:   Positive for hearing loss.   Psychiatric/Behavioral: Positive for depression.  All other systems reviewed and are negative.   PAST MEDICAL/SURGICAL HISTORY:  Past Medical History:  Diagnosis Date  . AAA (abdominal aortic aneurysm) ([place])    Remote ~1994   . Anemia   . Arthritis   . Atrial fibrillation [facility])    Diagnosed December 2014  . AVNRT (AV nodal re-entry tachycardia) ([place])    Possible  . Cataract    Bilateral  . Cirrhosis ([place])   . CKD (chronic kidney disease) stage 3, GFR 30-59 ml/min ([facility])   . Essential hypertension   . Gout   . History of GI bleed   . History of gout   . History of hepatitis   . History of radiation therapy 07/18/2020-08/06/2020   L Lung SBRT; Dr. Gery Pray  . History of stroke    . Hyperlipidemia   . PAD (peripheral artery disease) (H[facility])    Stenting in lower extremities (no records)  . Prostate cancer ([place])    XRT; in remission  . Type 2 diabetes mellitus ([place])    Past Surgical History:  Procedure Laterality Date  . ABDOMINAL AORTIC ANEURYSM REPAIR  1980's   in [place]  . APPENDECTOMY    . CAROTID ENDARTERECTOMY Right ~ 1999  . CATARACT EXTRACTION W/ INTRAOCULAR LENS  IMPLANT, BILATERAL Bilateral 2014  . CHOLECYSTECTOMY    . COLON SURGERY    . PARTIAL COLECTOMY     "I was bleeding to death inside; took out 2/3 of my colon" (08/22/2013)    SOCIAL HISTORY:  Social History   Socioeconomic History  . Marital status: Married    Spouse name: Not on file  . Number of children: Not on file  . Years of education: Not on file  . Highest education level: Not on file  Occupational History  . Not on file  Tobacco Use  . Smoking status: Former Smoker    Packs/day: 0.25    Years: 70.00    Pack years: 17.50    Types: Cigarettes    Start date: 09/11/1944    Quit date: 02/17/2020    Years since quitting: 0.7  . Smokeless tobacco: Former Systems developer    Types: Snuff, Database administrator  Use  . Vaping Use: Never used  Substance and Sexual Activity  . Alcohol use: Not Currently    Alcohol/week: 0.0 standard drinks    Comment: 08/22/2013 "drink ~ 1 beer and wine/yr"  . Drug use: No  . Sexual activity: Never  Other Topics Concern  . Not on file  Social History Narrative   From [place]. Relocated to this area from [facility].   Social Determinants of Health   Financial Resource Strain: Not on file  Food Insecurity: Not on file  Transportation Needs: Not on file  Physical Activity: Inactive  . Days of Exercise per Week: 0 days  . Minutes of Exercise per Session: 0 min  Stress: Not on file  Social Connections: Not on file  Intimate Partner Violence: Not At Risk  . Fear of Current or Ex-Partner: No  . Emotionally Abused: No  . Physically Abused: No  .  Sexually Abused: No    FAMILY HISTORY:  Family History  Problem Relation Age of Onset  . Cancer Mother        uterine deceased age 23  . Hypertension Sister   . Arrhythmia Other        Atrial fibrillation  . Hypertension Maternal Grandmother     CURRENT MEDICATIONS:  Current Outpatient Medications  Medication Sig Dispense Refill  . acetaminophen (TYLENOL) 500 MG tablet Take 1,000 mg by mouth every 6 (six) hours as needed for headache.    . albuterol (VENTOLIN HFA) 108 (90 Base) MCG/ACT inhaler Inhale 1-2 puffs into the lungs every 6 (six) hours as needed for wheezing or shortness of breath. (Patient taking differently: Inhale 2 puffs into the lungs every 6 (six) hours as needed for wheezing or shortness of breath.) 18 g 2  . allopurinol (ZYLOPRIM) 100 MG tablet TAKE 1 TABLET BY MOUTH EVERY DAY 90 tablet 0  . Alpha-D-Galactosidase (BEANO ULTRA 800) TABS Take 1,600 mg by mouth daily as needed (gas).    . betamethasone valerate ointment (VALISONE) 0.1 % Apply 1 application topically 2 (two) times daily. Avoid face, axilla and groin. (use as needed for psoriasis flares) 90 g 1  . Calcium Carbonate-Vitamin D 600-400 MG-UNIT tablet Take 1 tablet by mouth daily.    . Carboxymethylcellulose Sodium (ARTIFICIAL TEARS OP) Place 1 drop into both eyes daily as needed (dry eyes).    Marland Kitchen docusate sodium (COLACE) 100 MG capsule Take 100 mg by mouth 2 (two) times daily.    . furosemide (LASIX) 20 MG tablet Take 1 tablet (20 mg total) by mouth daily. 90 tablet 1  . memantine (NAMENDA) 5 MG tablet Take 1 tablet (5 mg total) by mouth 2 (two) times daily. (Patient taking differently: Take 2.5 mg by mouth 2 (two) times daily.) 180 tablet 3  . mirtazapine (REMERON) 7.5 MG tablet TAKE 1 TABLET BY MOUTH AT BEDTIME. 90 tablet 1  . Multiple Vitamin (MULTIVITAMIN) tablet Take 1 tablet by mouth daily.    Marland Kitchen umeclidinium-vilanterol (ANORO ELLIPTA) 62.5-25 MCG/INH AEPB Inhale 1 puff into the lungs daily. (Patient taking  differently: Inhale 1 puff into the lungs daily as needed (shortness of breath).) 60 each 12  . XARELTO 15 MG TABS tablet TAKE 1 TABLET (15 MG TOTAL) BY MOUTH DAILY WITH SUPPER. 90 tablet 0  . metoprolol tartrate (LOPRESSOR) 25 MG tablet Take 1 tablet (25 mg total) by mouth 2 (two) times daily. 180 tablet 3   No current facility-administered medications for this visit.    ALLERGIES:  Allergies  Allergen  Reactions  . Morphine Other (See Comments)    hallucinate    PHYSICAL EXAM:  Performance status (ECOG): 1 - Symptomatic but completely ambulatory  Vitals:   11/07/20 1422  BP: (!) 131/96  Pulse: 86  Resp: 17  Temp: 97.9 F (36.6 C)  SpO2: 100%   Wt Readings from Last 3 Encounters:  11/07/20 179 lb 3 oz (81.3 kg)  10/03/20 177 lb (80.3 kg)  09/09/20 178 lb 9.6 oz (81 kg)   Physical Exam Vitals reviewed.  Constitutional:      Appearance: Normal appearance.     Comments: In wheelchair  Cardiovascular:     Rate and Rhythm: Normal rate and regular rhythm.     Pulses: Normal pulses.     Heart sounds: Normal heart sounds.  Pulmonary:     Effort: Pulmonary effort is normal.     Breath sounds: Normal breath sounds.  Musculoskeletal:     Right lower leg: No edema.     Left lower leg: No edema.  Neurological:     General: No focal deficit present.     Mental Status: He is alert and oriented to person, place, and time.  Psychiatric:        Mood and Affect: Mood normal.        Behavior: Behavior normal.      LABORATORY DATA:  I have reviewed the labs as listed.  CBC Latest Ref Rng & Units 11/04/2020 07/09/2020 06/17/2020  WBC 4.0 - 10.5 K/uL 7.2 7.4 10.5  Hemoglobin 13.0 - 17.0 g/dL 14.2 15.3 16.0  Hematocrit 39.0 - 52.0 % 45.0 45.7 49.9  Platelets 150 - 400 K/uL 164 187 216   CMP Latest Ref Rng & Units 11/04/2020 08/20/2020 08/06/2020  Glucose 70 - 99 mg/dL 110(H) 120(H) 100(H)  BUN 8 - 23 mg/dL 32(H) 30(H) 45(H)  Creatinine 0.61 - 1.24 mg/dL 2.00(H) 1.83(H) 2.16(H)   Sodium 135 - 145 mmol/L 139 142 142  Potassium 3.5 - 5.1 mmol/L 4.9 4.8 5.2  Chloride 98 - 111 mmol/L 102 102 100  CO2 22 - 32 mmol/L 29 24 26   Calcium 8.9 - 10.3 mg/dL 9.7 9.4 10.2  Total Protein 6.5 - 8.1 g/dL 7.2 - -  Total Bilirubin 0.3 - 1.2 mg/dL 0.6 - -  Alkaline Phos 38 - 126 U/L 87 - -  AST 15 - 41 U/L 21 - -  ALT 0 - 44 U/L 14 - -    DIAGNOSTIC IMAGING:  I have independently reviewed the scans and discussed with the patient. CT Chest Wo Contrast  Result Date: 11/05/2020 CLINICAL DATA:  87 year old male with history of non-small cell lung cancer. Evaluate for treatment response. EXAM: CT CHEST WITHOUT CONTRAST TECHNIQUE: Multidetector CT imaging of the chest was performed following the standard protocol without IV contrast. COMPARISON:  PET-CT 07/08/2020. FINDINGS: Cardiovascular: Heart size is normal. There is no significant pericardial fluid, thickening or pericardial calcification. There is aortic atherosclerosis, as well as atherosclerosis of the great vessels of the mediastinum and the coronary arteries, including calcified atherosclerotic plaque in the left main, left anterior descending, left circumflex and right coronary arteries. Severe calcifications of the aortic valve. Mediastinum/Nodes: No pathologically enlarged mediastinal or hilar lymph nodes. Please note that accurate exclusion of hilar adenopathy is limited on noncontrast CT scans. Esophagus is unremarkable in appearance. No axillary lymphadenopathy. Lungs/Pleura: Previously noted left upper lobe pulmonary nodule has regressed, currently measuring only 1.2 x 1.5 cm (axial image 72 of series 4). 6 mm pulmonary nodule  in the medial segment of the right middle lobe (axial image 97 of series 4), stable in retrospect compared to the prior PET-CT. No other suspicious appearing pulmonary nodules or masses are noted. No acute consolidative airspace disease. No pleural effusions. Mild diffuse bronchial wall thickening with mild  centrilobular and paraseptal emphysema. Scattered areas of mild cylindrical bronchiectasis are noted, most evident in the lower lobes of the lungs bilaterally where there is also some thickening of the peribronchovascular interstitium and regional volume loss, most compatible with chronic post infectious or inflammatory scarring. Upper Abdomen: Aortic atherosclerosis. Multiple low-attenuation lesions in the left kidney, incompletely characterized on today's non-contrast CT examination, but statistically likely to represent cysts, largest of which measures up to 5.2 cm in diameter. Status post cholecystectomy. Musculoskeletal: There are no aggressive appearing lytic or blastic lesions noted in the visualized portions of the skeleton. IMPRESSION: 1. Today's study demonstrates a positive response to therapy with some regression of the previously noted left upper lobe pulmonary nodule. 2. No lymphadenopathy. 3. Stable 6 mm pulmonary nodule in the medial segment of the right middle lobe. 4. Aortic atherosclerosis, in addition to left main and 3 vessel coronary artery disease. 5. There are calcifications of the aortic valve. Echocardiographic correlation for evaluation of potential valvular dysfunction may be warranted if clinically indicated. 6. Mild diffuse bronchial wall thickening with mild centrilobular and paraseptal emphysema; imaging findings suggestive of underlying COPD. Aortic Atherosclerosis (ICD10-I70.0) and Emphysema (ICD10-J43.9). Electronically Signed   By: Vinnie Langton M.D.   On: 11/05/2020 09:26     ASSESSMENT:  1. 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 -Incidental finding of left upper lobe lung nodule on Axumin PET scan done for prostate cancer. -CT biopsy on 06/18/2019 one of the left upper lobe lung nodule consistent with squamous cell carcinoma, IHC p40 positive. -PET CT scan on 07/08/2020 shows 30 mm left upper lobe nodule, SUV 10.  No  hypermetabolic adenopathy.  Hypermetabolic focally in the right prostate is nonspecific.  2. Prostate cancer: -Prostate cancer, Gleason 9 diagnosed in April 2010 -Status post seed placement and XRT completed on 08/16/2009 in [place]. -PSA recurrence and was started on Lupron in 2017 after PSA rose to 9.9 in 6/17 from 3.6 in 12/16. -PSA nadired at 0.2 on Lupron in 9/18 but has been rising, 0.4 in 3/20, 0.8 in 12/20 and 1.5 on 04/02/2020. Testosterone levels remained castrate. -Axumin PET scan on 05/14/2020 shows hypermetabolic left upper lobe lung nodule. Radiation seeds in the prostate with diffuse hypermetabolic him, indeterminate. Smaller right upper lobe lung nodule is below PET resolution. No skeletal metastasis.   PLAN:  1. 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 -We have reviewed CT chest without contrast from 11/04/2020 which showed decrease in size of the treated area. 8 mm right middle lobe lung nodule is stable. No new lesions seen. No adenopathy. -Recommend follow-up in 3 months with repeat CT scan.  2. M0 castrate resistant prostate cancer: -Last Lupron injection on 10/03/2020. -Continue follow-up with Dr. Jeffie Pollock.  3. CKD: -Latest creatinine is 2.0. Has been stable.   Orders placed this encounter:  No orders of the defined types were placed in this encounter.    Derek Jack, MD [place] [phone]   I, Milinda Antis, am acting as a scribe for Dr. Sanda Linger.  I, Derek Jack MD, have reviewed the above documentation for accuracy and  completeness, and I agree with the above.

## 2020-11-07 NOTE — Patient Instructions (Signed)
[place] at [facility] Discharge Instructions  You were seen today by Dr. Delton Coombes. He went over your recent results and scans. You will be scheduled to have a CT scan of your chest done before your next visit. Dr. Delton Coombes will see you back in 3 months for labs and follow up.   Thank you for choosing [place] at [facility] to provide your oncology and hematology care.  To afford each patient quality time with our provider, please arrive at least 15 minutes before your scheduled appointment time.   If you have a lab appointment with the [facility] please come in thru the Main Entrance and check in at the main information desk  You need to re-schedule your appointment should you arrive 10 or more minutes late.  We strive to give you quality time with our providers, and arriving late affects you and other patients whose appointments are after yours.  Also, if you no show three or more times for appointments you may be dismissed from the clinic at the providers discretion.     Again, thank you for choosing [facility].  Our hope is that these requests will decrease the amount of time that you wait before being seen by our physicians.       _____________________________________________________________  Should you have questions after your visit to [facility], please contact our office at [phone] between the hours of 8:00 a.m. and 4:30 p.m.  Voicemails left after 4:00 p.m. will not be returned until the following business day.  For prescription refill requests, have your pharmacy contact our office and allow 72 hours.    Cancer Center Support Programs:   > Cancer Support Group  2nd Tuesday of the month 1pm-2pm, Journey Room

## 2020-11-11 ENCOUNTER — Ambulatory Visit: Payer: Medicare Other | Admitting: Family Medicine

## 2020-11-11 ENCOUNTER — Ambulatory Visit: Payer: Self-pay | Admitting: Radiation Oncology

## 2020-11-12 ENCOUNTER — Telehealth: Payer: Self-pay | Admitting: Radiation Oncology

## 2020-11-12 NOTE — Telephone Encounter (Signed)
Mrs. Longmore returned my call regarding rescheduling Mr.Noyes's 2/28 follow up appointment with Dr. Sondra Come. She stated that Dr. Sondra Come informed them that Mr. Chaloux could follow up with him or Dr. Delton Coombes and they decided to follow up Dr. Delton Coombes. Additionally, Mrs. Combes stated that Dr. Delton Coombes ordered a CT which is scheduled for 02/11/21 and depending on the results they may follow up with Dr. Sondra Come.

## 2020-11-13 ENCOUNTER — Encounter: Payer: Self-pay | Admitting: Family Medicine

## 2020-11-13 ENCOUNTER — Ambulatory Visit (INDEPENDENT_AMBULATORY_CARE_PROVIDER_SITE_OTHER): Payer: Medicare Other | Admitting: Family Medicine

## 2020-11-13 ENCOUNTER — Other Ambulatory Visit: Payer: Self-pay

## 2020-11-13 VITALS — BP 124/80 | HR 74 | Temp 98.1°F | Ht 70.0 in | Wt 184.0 lb

## 2020-11-13 DIAGNOSIS — C3492 Malignant neoplasm of unspecified part of left bronchus or lung: Secondary | ICD-10-CM

## 2020-11-13 DIAGNOSIS — I480 Paroxysmal atrial fibrillation: Secondary | ICD-10-CM

## 2020-11-13 DIAGNOSIS — K5904 Chronic idiopathic constipation: Secondary | ICD-10-CM | POA: Diagnosis not present

## 2020-11-13 DIAGNOSIS — R0609 Other forms of dyspnea: Secondary | ICD-10-CM

## 2020-11-13 DIAGNOSIS — I10 Essential (primary) hypertension: Secondary | ICD-10-CM

## 2020-11-13 DIAGNOSIS — R0600 Dyspnea, unspecified: Secondary | ICD-10-CM

## 2020-11-13 DIAGNOSIS — H1131 Conjunctival hemorrhage, right eye: Secondary | ICD-10-CM | POA: Diagnosis not present

## 2020-11-13 NOTE — Patient Instructions (Signed)
Subconjunctival Hemorrhage Subconjunctival hemorrhage is bleeding that happens between the white part of your eye (sclera) and the clear membrane that covers the outside of your eye (conjunctiva). There are many tiny blood vessels near the surface of your eye. A subconjunctival hemorrhage happens when one or more of these vessels breaks and bleeds, causing a red patch to appear on your eye. This is similar to a bruise. Depending on the amount of bleeding, the red patch may only cover a small area of your eye or it may cover the entire visible part of the sclera. If a lot of blood collects under the conjunctiva, there may also be swelling. Subconjunctival hemorrhages do not affect your vision or cause pain, but your eye may feel irritated if there is swelling. Subconjunctival hemorrhages usually do not require treatment, and they usually disappear on their own within two weeks. What are the causes? This condition may be caused by:  Mild trauma, such as rubbing your eye too hard.  Blunt injuries, such as from playing sports or having contact with a deployed airbag.  Coughing, sneezing, or vomiting.  Straining, such as when lifting a heavy object.  High blood pressure.  Recent eye surgery.  Diabetes.  Certain medicines, especially blood thinners (anticoagulants).  Other conditions, such as eye tumors, bleeding disorders, or blood vessel abnormalities. Subconjunctival hemorrhages can also happen without an obvious cause. What are the signs or symptoms? Symptoms of this condition include:  A bright red or dark red patch on the white part of the eye. The red area may: ? Spread out to cover a larger area of the eye before it goes away. ? Turn brownish-yellow before it goes away.  Swelling around the eye.  Mild eye irritation.   How is this diagnosed? This condition is diagnosed with a physical exam. If your subconjunctival hemorrhage was caused by trauma, your health care provider may  refer you to an eye specialist (ophthalmologist) or another specialist to check for other injuries. You may have other tests, including:  An eye exam.  A blood pressure check.  Blood tests to check for bleeding disorders. If your subconjunctival hemorrhage was caused by trauma, X-rays or a CT scan may be done to check for other injuries. How is this treated? Usually, treatment is not needed for this condition. If you have discomfort, your health care provider may recommend eye drops or cold compresses. Follow these instructions at home:  Take over-the-counter and prescription medicines only as directed by your health care provider.  Use eye drops or cold compresses to help with discomfort as directed by your health care provider.  Avoid activities, things, and environments that may irritate or injure your eye.  Keep all follow-up visits as told by your health care provider. This is important. Contact a health care provider if:  You have pain in your eye.  The bleeding does not go away within 3 weeks.  You keep getting new subconjunctival hemorrhages. Get help right away if:  Your vision changes or you have difficulty seeing.  You suddenly develop severe sensitivity to light.  You develop a severe headache, persistent vomiting, confusion, or abnormal tiredness (lethargy).  Your eye seems to bulge or protrude from your eye socket.  You develop unexplained bruises on your body.  You have unexplained bleeding in another area of your body. Summary  Subconjunctival hemorrhage is bleeding that happens between the white part of your eye and the clear membrane that covers the outside of your eye.  This condition is similar to a bruise.  Subconjunctival hemorrhages usually do not require treatment, and they usually disappear on their own within two weeks.  Use eye drops or cold compresses to help with discomfort as directed by your health care provider. This information is not  intended to replace advice given to you by your health care provider. Make sure you discuss any questions you have with your health care provider. Document Revised: 02/15/2019 Document Reviewed: 06/01/2018 Elsevier Patient Education  2021 [employer].

## 2020-11-13 NOTE — Progress Notes (Signed)
Subjective: CC: Memory, dyspnea on exertion, lung cancer PCP: Janora Norlander, DO GMW:NUUV Erik Mullins is a 87 y.o. male presenting to clinic today for:  1.  Dyspnea on exertion, lung cancer Patient with ongoing dyspnea on exertion.  He denies any dyspnea at rest or at bedtime.  He has had evaluation by cardiology but does not think that this is coming from his heart.  He does not report any hemoptysis.  His lung cancer is shrinking in size and seems to be responding to the cancer treatment quite well.  He does report an occasional tingling sensation in the right side of his chest but this dissipates as it moves across the center.  This is intermittent.  He is not seeing a pulmonologist.  2.  Dementia Patient with stable dementia.  His wife accompanies him to today's visit.  No concerns today.  3.  Constipation Patient never did start the Trulance.  His wife notes that the constipation improved with a capsule Metamucil, 1 capsule of Colace each morning and 2 capsules of Colace each evening.  He is not had any issues with this regimen.  Symptoms were aggravated by Linzess and caused diarrhea.   ROS: Per HPI  Allergies  Allergen Reactions  . Morphine Other (See Comments)    hallucinate   Past Medical History:  Diagnosis Date  . AAA (abdominal aortic aneurysm) ([place])    Remote ~1994   . Anemia   . Arthritis   . Atrial fibrillation [facility])    Diagnosed December 2014  . AVNRT (AV nodal re-entry tachycardia) ([place])    Possible  . Cataract    Bilateral  . Cirrhosis ([place])   . CKD (chronic kidney disease) stage 3, GFR 30-59 ml/min (HCC)   . Essential hypertension   . Gout   . History of GI bleed   . History of gout   . History of hepatitis   . History of radiation therapy 07/18/2020-08/06/2020   L Lung SBRT; Dr. Gery Pray  . History of stroke   . Hyperlipidemia   . PAD (peripheral artery disease) (HCC)    Stenting in lower extremities (no records)  . Prostate cancer ([place])     XRT; in remission  . Type 2 diabetes mellitus (HCC)     Current Outpatient Medications:  .  acetaminophen (TYLENOL) 500 MG tablet, Take 1,000 mg by mouth every 6 (six) hours as needed for headache., Disp: , Rfl:  .  albuterol (VENTOLIN HFA) 108 (90 Base) MCG/ACT inhaler, Inhale 1-2 puffs into the lungs every 6 (six) hours as needed for wheezing or shortness of breath. (Patient taking differently: Inhale 2 puffs into the lungs every 6 (six) hours as needed for wheezing or shortness of breath.), Disp: 18 g, Rfl: 2 .  allopurinol (ZYLOPRIM) 100 MG tablet, TAKE 1 TABLET BY MOUTH EVERY DAY, Disp: 90 tablet, Rfl: 0 .  Alpha-D-Galactosidase (BEANO ULTRA 800) TABS, Take 1,600 mg by mouth daily as needed (gas)., Disp: , Rfl:  .  betamethasone valerate ointment (VALISONE) 0.1 %, Apply 1 application topically 2 (two) times daily. Avoid face, axilla and groin. (use as needed for psoriasis flares), Disp: 90 g, Rfl: 1 .  Calcium Carbonate-Vitamin D 600-400 MG-UNIT tablet, Take 1 tablet by mouth daily., Disp: , Rfl:  .  Carboxymethylcellulose Sodium (ARTIFICIAL TEARS OP), Place 1 drop into both eyes daily as needed (dry eyes)., Disp: , Rfl:  .  docusate sodium (COLACE) 100 MG capsule, Take 100 mg by mouth 2 (  two) times daily., Disp: , Rfl:  .  furosemide (LASIX) 20 MG tablet, Take 1 tablet (20 mg total) by mouth daily., Disp: 90 tablet, Rfl: 1 .  memantine (NAMENDA) 5 MG tablet, Take 1 tablet (5 mg total) by mouth 2 (two) times daily. (Patient taking differently: Take 2.5 mg by mouth 2 (two) times daily.), Disp: 180 tablet, Rfl: 3 .  metoprolol tartrate (LOPRESSOR) 25 MG tablet, Take 1 tablet (25 mg total) by mouth 2 (two) times daily., Disp: 180 tablet, Rfl: 3 .  mirtazapine (REMERON) 7.5 MG tablet, TAKE 1 TABLET BY MOUTH AT BEDTIME., Disp: 90 tablet, Rfl: 1 .  Multiple Vitamin (MULTIVITAMIN) tablet, Take 1 tablet by mouth daily., Disp: , Rfl:  .  umeclidinium-vilanterol (ANORO ELLIPTA) 62.5-25 MCG/INH AEPB,  Inhale 1 puff into the lungs daily. (Patient taking differently: Inhale 1 puff into the lungs daily as needed (shortness of breath).), Disp: 60 each, Rfl: 12 .  XARELTO 15 MG TABS tablet, TAKE 1 TABLET (15 MG TOTAL) BY MOUTH DAILY WITH SUPPER., Disp: 90 tablet, Rfl: 0 Social History   Socioeconomic History  . Marital status: Married    Spouse name: Not on file  . Number of children: Not on file  . Years of education: Not on file  . Highest education level: Not on file  Occupational History  . Not on file  Tobacco Use  . Smoking status: Former Smoker    Packs/day: 0.25    Years: 70.00    Pack years: 17.50    Types: Cigarettes    Start date: 09/11/1944    Quit date: 02/17/2020    Years since quitting: 0.7  . Smokeless tobacco: Former Systems developer    Types: Snuff, Chew  Vaping Use  . Vaping Use: Never used  Substance and Sexual Activity  . Alcohol use: Not Currently    Alcohol/week: 0.0 standard drinks    Comment: 08/22/2013 "drink ~ 1 beer and wine/yr"  . Drug use: No  . Sexual activity: Never  Other Topics Concern  . Not on file  Social History Narrative   From [place]. Relocated to this area from [facility].   Social Determinants of Health   Financial Resource Strain: Not on file  Food Insecurity: Not on file  Transportation Needs: Not on file  Physical Activity: Inactive  . Days of Exercise per Week: 0 days  . Minutes of Exercise per Session: 0 min  Stress: Not on file  Social Connections: Not on file  Intimate Partner Violence: Not At Risk  . Fear of Current or Ex-Partner: No  . Emotionally Abused: No  . Physically Abused: No  . Sexually Abused: No   Family History  Problem Relation Age of Onset  . Cancer Mother        uterine deceased age 59  . Hypertension Sister   . Arrhythmia Other        Atrial fibrillation  . Hypertension Maternal Grandmother     Objective: Office vital signs reviewed. BP 124/80   Pulse 74   Temp 98.1 F (36.7 C) (Temporal)   Ht  5\' 10"  (1.778 m)   Wt 184 lb (83.5 kg)   SpO2 96%   BMI 26.40 kg/m   Physical Examination:  General: Awake, alert, well nourished, No acute distress HEENT: subconjunctival hemorrhage noted along the medial aspect of the right eye, PERRLA, EOMI, MMM Cardio: regular rate and rhythm, S1S2 heard, no murmurs appreciated Pulm: clear to auscultation bilaterally, no wheezes, rhonchi or rales; normal  work of breathing on room air MSK: Ambulating independently Neuro: Looks to his wife intermittently to answer questions but overall is engaging during our visit.  Assessment/ Plan: 87 y.o. male   Chronic idiopathic constipation  Paroxysmal atrial fibrillation (HCC)  Essential hypertension, benign  Squamous cell lung cancer, left ([place]) - Plan: Ambulatory referral to Pulmonology  Dyspnea on exertion - Plan: Ambulatory referral to Pulmonology  Subconjunctival hematoma, right  Constipation has resolved with current therapies.  Continue current regimen  He is rate controlled  Blood pressures controlled  Cancer seems to be getting smaller on recent CAT scan.  He is closely followed by oncology for this.  Continues to have dyspnea on exertion.  I ambulated him today but lowest ambulatory oxygen saturation was 90%.  Heart rate did go up to 121 however.  He was 96% on room air at rest.  Symptoms are refractory to the inhalers that I prescribed and therefore placed a referral to pulmonology for further evaluation  He has what appears to be a healing subconjunctival hemorrhage on the right lower medial thigh.  No concerning features.  Supportive care recommended.  Follow-up if unusual symptoms arise.  Orders Placed This Encounter  Procedures  . Ambulatory referral to Pulmonology    Referral Priority:   Routine    Referral Type:   Consultation    Referral Reason:   Specialty Services Required    Requested Specialty:   Pulmonary Disease    Number of Visits Requested:   1   No orders of the  defined types were placed in this encounter.    Janora Norlander, DO [place] [phone]

## 2020-12-03 ENCOUNTER — Encounter: Payer: Self-pay | Admitting: Internal Medicine

## 2020-12-03 ENCOUNTER — Ambulatory Visit (INDEPENDENT_AMBULATORY_CARE_PROVIDER_SITE_OTHER): Payer: Medicare Other | Admitting: Internal Medicine

## 2020-12-03 ENCOUNTER — Other Ambulatory Visit: Payer: Self-pay

## 2020-12-03 DIAGNOSIS — J449 Chronic obstructive pulmonary disease, unspecified: Secondary | ICD-10-CM | POA: Diagnosis not present

## 2020-12-03 NOTE — Patient Instructions (Addendum)
Plan A = Automatic = Always=    anoro one click each am   Work on inhaler technique:  relax and gently blow all the way out then take a nice smooth deep breath back in,   Hold for up to 5 seconds if you can.   Rinse and gargle with water when done.      Plan B = Backup (to supplement plan A, not to replace it) Only use your albuterol inhaler as a rescue medication to be used if you can't catch your breath by resting or doing a relaxed purse lip breathing pattern.  - The less you use it, the better it will work when you need it. - Ok to use the inhaler up to 2 puffs  every 4 hours if you must but call for appointment if use goes up over your usual need - Don't leave home without it !!  (think of it like the spare tire for your car)   Try albuterol 15 min before an activity that you know would make you short of breath and see if it makes any difference and if makes none then don't take it after activity unless you can't catch your breath.      Ok to try off anoro tosee  what if any difference it makes in activity tolerance or need for albuterol    Please schedule a follow up office visit in 6 weeks, call sooner if needed with PFTs Bring your inhalers when return

## 2020-12-03 NOTE — Progress Notes (Signed)
Erik Mullins, male    DOB: 07/09/1931     MRN: [id number]   Brief patient profile:  12 yowm quit smoking in June 2021 with doe then  on prn saba and added anoro x end of 2021 but not helping so referred to pulmonary clinic in [facility]  12/03/2020 by Dr  Erik Mullins      History of Present Illness  12/03/2020  Pulmonary/ 1st office eval/Erik Mullins  Chief Complaint  Patient presents with  . Pulmonary Consult    Referred by Dr. Ronnie Mullins. Pt c/o DOE for the past 8-10 months. He does okay on flat surface but winded anytime walking up an incline. He has an albuterol inhaler 1-2 x per day on average.   Dyspnea:  Driveway slt decline has trouble dragging trash can back  Up to house due to sob but also bad back  Cough: none  Sleep: bed is flat/ one pillow  SABA use: sev times per day ? If helps/ technique is poor  No obvious day to day or daytime variability or assoc excess/ purulent sputum or mucus plugs or hemoptysis or cp or chest tightness, subjective wheeze or overt sinus or hb symptoms.   Sleeping  without nocturnal  or early am exacerbation  of respiratory  c/o's or need for noct saba. Also denies any obvious fluctuation of symptoms with weather or environmental changes or other aggravating or alleviating factors except as outlined above   No unusual exposure hx or h/o childhood pna/ asthma or knowledge of premature birth.  Current Allergies, Complete Past Medical History, Past Surgical History, Family History, and Social History were reviewed in [employer] record.  ROS  The following are not active complaints unless bolded Hoarseness, sore throat, dysphagia, dental problems, itching, sneezing,  nasal congestion or discharge of excess mucus or purulent secretions, ear ache,   fever, chills, sweats, unintended wt loss or wt gain, classically pleuritic or exertional cp,  orthopnea pnd or arm/hand swelling  or leg swelling, presyncope, palpitations, abdominal  pain, anorexia, nausea, vomiting, diarrhea  or change in bowel habits or change in bladder habits, change in stools or change in urine, dysuria, hematuria,  rash, arthralgias, visual complaints, headache, numbness, weakness or ataxia or problems with walking or coordination,  change in mood or  memory.           Past Medical History:  Diagnosis Date  . AAA (abdominal aortic aneurysm) ([place])    Remote ~1994   . Anemia   . Arthritis   . Atrial fibrillation [facility])    Diagnosed December 2014  . AVNRT (AV nodal re-entry tachycardia) ([place])    Possible  . Cataract    Bilateral  . Cirrhosis ([place])   . CKD (chronic kidney disease) stage 3, GFR 30-59 ml/min (HCC)   . Essential hypertension   . Gout   . History of GI bleed   . History of gout   . History of hepatitis   . History of radiation therapy 07/18/2020-08/06/2020   L Lung SBRT; Dr. Gery Mullins  . History of stroke   . Hyperlipidemia   . PAD (peripheral artery disease) (HCC)    Stenting in lower extremities (no records)  . Prostate cancer ([place])    XRT; in remission  . Type 2 diabetes mellitus ([place])     Outpatient Medications Prior to Visit  Medication Sig Dispense Refill  . acetaminophen (TYLENOL) 500 MG tablet Take 1,000 mg by mouth every 6 (six) hours as  needed for headache.    . albuterol (VENTOLIN HFA) 108 (90 Base) MCG/ACT inhaler Inhale 1-2 puffs into the lungs every 6 (six) hours as needed for wheezing or shortness of breath. (Patient taking differently: Inhale 2 puffs into the lungs every 6 (six) hours as needed for wheezing or shortness of breath.) 18 g 2  . allopurinol (ZYLOPRIM) 100 MG tablet TAKE 1 TABLET BY MOUTH EVERY DAY 90 tablet 0  . Alpha-D-Galactosidase (BEANO ULTRA 800) TABS Take 1,600 mg by mouth daily as needed (gas).    . betamethasone valerate ointment (VALISONE) 0.1 % Apply 1 application topically 2 (two) times daily. Avoid face, axilla and groin. (use as needed for psoriasis flares) 90 g 1  . Calcium  Carbonate-Vitamin D 600-400 MG-UNIT tablet Take 1 tablet by mouth daily.    . Carboxymethylcellulose Sodium (ARTIFICIAL TEARS OP) Place 1 drop into both eyes daily as needed (dry eyes).    Marland Kitchen docusate sodium (COLACE) 100 MG capsule Take 100 mg by mouth 2 (two) times daily.    . furosemide (LASIX) 20 MG tablet Take 1 tablet (20 mg total) by mouth daily. 90 tablet 1  . memantine (NAMENDA) 5 MG tablet Take 1 tablet (5 mg total) by mouth 2 (two) times daily. (Patient taking differently: Take 2.5 mg by mouth 2 (two) times daily.) 180 tablet 3  . metoprolol tartrate (LOPRESSOR) 25 MG tablet Take 1 tablet (25 mg total) by mouth 2 (two) times daily. 180 tablet 3  . mirtazapine (REMERON) 7.5 MG tablet TAKE 1 TABLET BY MOUTH AT BEDTIME. 90 tablet 1  . Multiple Vitamin (MULTIVITAMIN) tablet Take 1 tablet by mouth daily.    Marland Kitchen umeclidinium-vilanterol (ANORO ELLIPTA) 62.5-25 MCG/INH AEPB Inhale 1 puff into the lungs daily. (Patient taking differently: Inhale 1 puff into the lungs daily as needed (shortness of breath).) 60 each 12  . XARELTO 15 MG TABS tablet TAKE 1 TABLET (15 MG TOTAL) BY MOUTH DAILY WITH SUPPER. 90 tablet 0   No facility-administered medications prior to visit.     Objective:     BP 134/84 (BP Location: Left Arm, Cuff Size: Normal)   Pulse 85   Temp (!) 97.5 F (36.4 C) (Temporal)   Ht 5' 11.5" (1.816 m)   Wt 184 lb (83.5 kg)   SpO2 95% Comment: on RA  BMI 25.31 kg/m   SpO2: 95 % (on RA)   amb elderly wm, has trouble getting up on table and walks stooped over somewhat   HEENT : pt wearing mask not removed for exam due to covid -19 concerns.    NECK :  without JVD/Nodes/TM/ nl carotid upstrokes bilaterally   LUNGS: no acc muscle use,  Mod barrel  contour chest wall with bilateral  Distant bs s audible wheeze and  without cough on insp or exp maneuvers and mod  Hyperresonant  to  percussion bilaterally     CV:  RRR  no s3 or murmur or increase in P2, and no edema   ABD:   soft and nontender with pos mid insp Hoover's  in the supine position. No bruits or organomegaly appreciated, bowel sounds nl  MS:  Gait a bit awkward/poor posture   ext warm without deformities, calf tenderness, cyanosis or clubbing No obvious joint restrictions   SKIN: warm and dry without lesions    NEURO:  alert, approp, nl sensorium with  no motor or cerebellar deficits apparent.            I personally  reviewed images and agree with radiology impression as follows:   Chest CT  W/o contrast 11/04/20  1. Today's study demonstrates a positive response to therapy with some regression of the previously noted left upper lobe pulmonary nodule. 2. No lymphadenopathy. 3. Stable 6 mm pulmonary nodule in the medial segment of the right middle lobe. 4. Aortic atherosclerosis, in addition to left main and 3 vessel coronary artery disease. 5. There are calcifications of the aortic valve. Echocardiographic correlation for evaluation of potential valvular dysfunction may be warranted if clinically indicated. 6. Mild diffuse bronchial wall thickening with mild centrilobular and paraseptal emphysema; imaging findings suggestive of underlying COPD. Assessment   COPD mixed type ([place]) Quit smoking 02/2020 and breathing worse since Quit smoking 02/2020 and breathing worse since  - 12/03/2020   Walked RA  approx 200 ft  average pace/min SOB/stopped due to leg fatigue  As I explained to this patient in detail:  although there may be copd likely present, it may not be clinically relevant:   it does not appear to be limiting activity tolerance any more than a set of worn tires limits someone from driving a car  around a parking lot.   It remains to be seen therefore whether the anoro is really approp or not so will give it a try and monitor his activity tol while using it effectivly   - The proper method of use, as well as anticipated side effects, of a metered-dose inhaler were discussed and  demonstrated to the patient using teach back method for an elipta inhaler.  If he does indeed prove to have copd/ likely Group B in terms of symptom/risk and laba/lama therefore appropriate rx at this point >>>  anoro approp and the easiest one to teach but still need to confirm technique on each visit and assure also using saba approp  I spent extra time with pt today reviewing appropriate use of albuterol for prn use on exertion with the following points: 1) saba is for relief of sob that does not improve by walking a slower pace or resting but rather if the pt does not improve after trying this first. 2) If the pt is convinced, as many are, that saba helps recover from activity faster then it's easy to tell if this is the case by re-challenging : ie stop, take the inhaler, then p 5 minutes try the exact same activity (intensity of workload) that just caused the symptoms and see if they are substantially diminished or not after saba 3) if there is an activity that reproducibly causes the symptoms, try the saba 15 min before the activity on alternate days   If in fact the saba really does help, then fine to continue to use it prn but advised may need to look closer at the maintenance regimen being used to achieve better control of airways disease with exertion.     Each maintenance medication was reviewed in detail including emphasizing most importantly the difference between maintenance and prns and under what circumstances the prns are to be triggered using an action plan format where appropriate.  Total time for H and P, chart review, counseling, reviewing elipta device(s) , directly observing portions of ambulatory 02 saturation study/ and generating customized AVS unique to this office visit / same day charting = > 60 min                      Christinia Gully, MD 12/03/2020

## 2020-12-03 NOTE — Assessment & Plan Note (Addendum)
Quit smoking 02/2020 and breathing worse since Quit smoking 02/2020 and breathing worse since  - 12/03/2020   Walked RA  approx 200 ft  average pace/min SOB/stopped due to leg fatigue  As I explained to this patient in detail:  although there may be copd likely present, it may not be clinically relevant:   it does not appear to be limiting activity tolerance any more than a set of worn tires limits someone from driving a car  around a parking lot.   It remains to be seen therefore whether the anoro is really approp or not so will give it a try and monitor his activity tol while using it effectivly   - The proper method of use, as well as anticipated side effects, of a metered-dose inhaler were discussed and demonstrated to the patient using teach back method for an elipta inhaler.  If he does indeed prove to have copd/ likely Group B in terms of symptom/risk and laba/lama therefore appropriate rx at this point >>>  anoro approp and the easiest one to teach but still need to confirm technique on each visit and assure also using saba approp  I spent extra time with pt today reviewing appropriate use of albuterol for prn use on exertion with the following points: 1) saba is for relief of sob that does not improve by walking a slower pace or resting but rather if the pt does not improve after trying this first. 2) If the pt is convinced, as many are, that saba helps recover from activity faster then it's easy to tell if this is the case by re-challenging : ie stop, take the inhaler, then p 5 minutes try the exact same activity (intensity of workload) that just caused the symptoms and see if they are substantially diminished or not after saba 3) if there is an activity that reproducibly causes the symptoms, try the saba 15 min before the activity on alternate days   If in fact the saba really does help, then fine to continue to use it prn but advised may need to look closer at the maintenance regimen being  used to achieve better control of airways disease with exertion.     Each maintenance medication was reviewed in detail including emphasizing most importantly the difference between maintenance and prns and under what circumstances the prns are to be triggered using an action plan format where appropriate.  Total time for H and P, chart review, counseling, reviewing elipta device(s) , directly observing portions of ambulatory 02 saturation study/ and generating customized AVS unique to this office visit / same day charting = > 60 min

## 2020-12-26 ENCOUNTER — Other Ambulatory Visit: Payer: Medicare Other

## 2020-12-27 ENCOUNTER — Other Ambulatory Visit: Payer: Medicare Other

## 2020-12-30 ENCOUNTER — Other Ambulatory Visit (HOSPITAL_COMMUNITY)
Admission: RE | Admit: 2020-12-30 | Discharge: 2020-12-30 | Disposition: A | Discharge status: Home / Self Care | Payer: Medicare Other | Source: Ambulatory Visit | Attending: Internal Medicine | Admitting: Internal Medicine

## 2020-12-30 ENCOUNTER — Other Ambulatory Visit: Payer: Self-pay

## 2020-12-30 DIAGNOSIS — Z20822 Contact with and (suspected) exposure to covid-19: Secondary | ICD-10-CM | POA: Insufficient documentation

## 2020-12-30 DIAGNOSIS — Z01812 Encounter for preprocedural laboratory examination: Secondary | ICD-10-CM | POA: Diagnosis not present

## 2020-12-31 ENCOUNTER — Ambulatory Visit (HOSPITAL_COMMUNITY)
Admission: RE | Admit: 2020-12-31 | Discharge: 2020-12-31 | Disposition: A | Discharge status: Home / Self Care | Payer: Medicare Other | Source: Ambulatory Visit | Attending: Internal Medicine | Admitting: Internal Medicine

## 2020-12-31 ENCOUNTER — Other Ambulatory Visit: Payer: Self-pay

## 2020-12-31 DIAGNOSIS — J449 Chronic obstructive pulmonary disease, unspecified: Secondary | ICD-10-CM

## 2020-12-31 LAB — PULMONARY FUNCTION TEST
DL/VA % pred: 54 %
DL/VA: 2.04 ml/min/mmHg/L
DLCO unc % pred: 37 %
DLCO unc: 9.16 ml/min/mmHg
FEF 25-75 Post: 0.47 L/sec
FEF 25-75 Pre: 0.7 L/sec
FEF2575-%Change-Post: -33 %
FEF2575-%Pred-Post: 27 %
FEF2575-%Pred-Pre: 40 %
FEV1-%Change-Post: -28 %
FEV1-%Pred-Post: 40 %
FEV1-%Pred-Pre: 56 %
FEV1-Post: 1.09 L
FEV1-Pre: 1.52 L
FEV1FVC-%Change-Post: -45 %
FEV1FVC-%Pred-Pre: 101 %
FEV6-%Change-Post: 11 %
FEV6-%Pred-Post: 65 %
FEV6-%Pred-Pre: 59 %
FEV6-Post: 2.38 L
FEV6-Pre: 2.14 L
FEV6FVC-%Change-Post: -11 %
FEV6FVC-%Pred-Post: 95 %
FEV6FVC-%Pred-Pre: 107 %
FVC-%Change-Post: 31 %
FVC-%Pred-Post: 72 %
FVC-%Pred-Pre: 54 %
FVC-Post: 2.8 L
FVC-Pre: 2.14 L
Post FEV1/FVC ratio: 39 %
Post FEV6/FVC ratio: 88 %
Pre FEV1/FVC ratio: 71 %
Pre FEV6/FVC Ratio: 100 %
RV % pred: 115 %
RV: 3.29 L
TLC % pred: 83 %
TLC: 6.08 L

## 2020-12-31 LAB — SARS CORONAVIRUS 2 (TAT 6-24 HRS): SARS Coronavirus 2: NEGATIVE

## 2020-12-31 MED ORDER — ALBUTEROL SULFATE (2.5 MG/3ML) 0.083% IN NEBU
2.5000 mg | INHALATION_SOLUTION | Freq: Once | RESPIRATORY_TRACT | Status: AC
  Administered 2020-12-31: 2.5 mg via RESPIRATORY_TRACT

## 2021-01-01 ENCOUNTER — Other Ambulatory Visit: Payer: Self-pay

## 2021-01-01 ENCOUNTER — Other Ambulatory Visit: Payer: Medicare Other

## 2021-01-01 ENCOUNTER — Encounter: Payer: Self-pay | Admitting: *Deleted

## 2021-01-01 DIAGNOSIS — C61 Malignant neoplasm of prostate: Secondary | ICD-10-CM | POA: Diagnosis not present

## 2021-01-02 ENCOUNTER — Ambulatory Visit (INDEPENDENT_AMBULATORY_CARE_PROVIDER_SITE_OTHER): Payer: Medicare Other | Admitting: Urology

## 2021-01-02 VITALS — BP 142/83 | HR 81 | Temp 97.6°F | Ht 70.0 in | Wt 180.0 lb

## 2021-01-02 DIAGNOSIS — C61 Malignant neoplasm of prostate: Secondary | ICD-10-CM | POA: Diagnosis not present

## 2021-01-02 DIAGNOSIS — R9721 Rising PSA following treatment for malignant neoplasm of prostate: Secondary | ICD-10-CM

## 2021-01-02 DIAGNOSIS — R351 Nocturia: Secondary | ICD-10-CM

## 2021-01-02 LAB — URINALYSIS, ROUTINE W REFLEX MICROSCOPIC
Bilirubin, UA: NEGATIVE
Glucose, UA: NEGATIVE
Ketones, UA: NEGATIVE
Leukocytes,UA: NEGATIVE
Nitrite, UA: NEGATIVE
RBC, UA: NEGATIVE
Specific Gravity, UA: 1.02 (ref 1.005–1.030)
Urobilinogen, Ur: 0.2 mg/dL (ref 0.2–1.0)
pH, UA: 5.5 (ref 5.0–7.5)

## 2021-01-02 LAB — TESTOSTERONE: Testosterone: <3 ng/dL — ABNORMAL LOW (ref 264–916)

## 2021-01-02 LAB — PSA: Prostate Specific Ag, Serum: 1.7 ng/mL (ref 0.0–4.0)

## 2021-01-02 NOTE — Progress Notes (Signed)
Urological Symptom Review  Patient is experiencing the following symptoms: Get up at night to urinate   Review of Systems  Gastrointestinal (upper)  : Negative for upper GI symptoms  Gastrointestinal (lower) : Negative for lower GI symptoms  Constitutional : Negative for symptoms  Skin: Negative for skin symptoms  Eyes: Negative for eye symptoms  Ear/Nose/Throat : Negative for Ear/Nose/Throat symptoms  Hematologic/Lymphatic: Negative for Hematologic/Lymphatic symptoms  Cardiovascular : Negative for cardiovascular symptoms  Respiratory : Negative for respiratory symptoms  Endocrine: Negative for endocrine symptoms  Musculoskeletal: Negative for musculoskeletal symptoms  Neurological: Negative for neurological symptoms  Psychologic: Depression Anxiety 

## 2021-01-02 NOTE — Progress Notes (Signed)
Subjective:  1. Prostate cancer ([place])   2. Rising PSA following treatment for malignant neoplasm of prostate   3. Nocturia     01/02/21: Erik Mullins returns today in f/u for his history of NM CRCP.  His PSA was back down to 1.7 on 01/01/21  from 2.0 on 08/20/20.   His testosterone level is <3.  He was last given Lupron on 10/03/20.   He had a Chest CT on 11/04/20 that showed some regression of the left upper lobe pulmonary nodule that was found to have a squamous cell CA.   His UA is clear today.   He is voiding well but has some frequency with a diuretic.  He has nocturia x 1.   He has had no hematuria.    08/23/20: Erik Mullins returns today in f/u.  He was found to have lung cancer at his last visit and has subsequently been treated with radiation therapy.    His PSA continues to rise and was 2.0 on 08/20/20 which is up from 1.5 in 7/21 and 0.8 in 12/20.  He T was castrate in 04/02/20.  He is voiding well but has stable nocturia 2-3x.  He has no bone pain or weight loss.    05/24/20: Erik Mullins returns today in f/u from an [place] PET done for his history of prostate cancer that was diagnosed in 4/10 and he completed radiation therapy on 08/16/09.  He had gleason 9 disease intiallly.  He had a PSA recurrence and was started on Lupron in 2017 after his PSA rose to 9.9 in 6/17 from 3.6 in 12/16.  A bone scan and CT in 7/17 showed no mets.   His PSA nadired at 0.2 on Lupron in 9/18 but has been rising and was 0.4 in 3/20, 0.8 in 12/20 and 1.5 on 04/02/20.  His testosterone has remained castrate.  His last Lupron injection was on 04/05/20.   He is doing well without other associated signs or symptoms.  He is voiding well with an IPSS of 3-4 with nocturia x 2-3.  He drinks a lot of coffee prior to bed.  He has no bone pain or weight loss.   He has no hematuria.     The PET scan showed a 1cm nodule in the left upper lobe that was hypermetabolic and could be prostatic or a bronchogenic carcinoma.  Tissue sampling was recommended.   There was some diffuse uptake in the prostate as well.       ROS:  ROS  Allergies  Allergen Reactions  . Other Other (See Comments)  . Morphine Other (See Comments)    hallucinate    Outpatient Encounter Medications as of 01/02/2021  Medication Sig  . acetaminophen (TYLENOL) 500 MG tablet Take 1,000 mg by mouth every 6 (six) hours as needed for headache.  . albuterol (VENTOLIN HFA) 108 (90 Base) MCG/ACT inhaler Inhale 1-2 puffs into the lungs every 6 (six) hours as needed for wheezing or shortness of breath. (Patient taking differently: Inhale 2 puffs into the lungs every 6 (six) hours as needed for wheezing or shortness of breath.)  . allopurinol (ZYLOPRIM) 100 MG tablet TAKE 1 TABLET BY MOUTH EVERY DAY  . Alpha-D-Galactosidase (BEANO ULTRA 800) TABS Take 1,600 mg by mouth daily as needed (gas).  . betamethasone valerate ointment (VALISONE) 0.1 % Apply 1 application topically 2 (two) times daily. Avoid face, axilla and groin. (use as needed for psoriasis flares)  . Calcium Carbonate-Vitamin D 600-400 MG-UNIT tablet Take 1 tablet  by mouth daily.  . Carboxymethylcellulose Sodium (ARTIFICIAL TEARS OP) Place 1 drop into both eyes daily as needed (dry eyes).  Marland Kitchen docusate sodium (COLACE) 100 MG capsule Take 100 mg by mouth 2 (two) times daily.  . furosemide (LASIX) 20 MG tablet Take 1 tablet (20 mg total) by mouth daily.  . memantine (NAMENDA) 5 MG tablet Take 1 tablet (5 mg total) by mouth 2 (two) times daily. (Patient taking differently: Take 2.5 mg by mouth 2 (two) times daily.)  . mirtazapine (REMERON) 7.5 MG tablet TAKE 1 TABLET BY MOUTH AT BEDTIME.  . Multiple Vitamin (MULTIVITAMIN) tablet Take 1 tablet by mouth daily.  Marland Kitchen umeclidinium-vilanterol (ANORO ELLIPTA) 62.5-25 MCG/INH AEPB Inhale 1 puff into the lungs daily. (Patient taking differently: Inhale 1 puff into the lungs daily as needed (shortness of breath).)  . XARELTO 15 MG TABS tablet TAKE 1 TABLET (15 MG TOTAL) BY MOUTH DAILY  WITH SUPPER.  . metoprolol tartrate (LOPRESSOR) 25 MG tablet Take 1 tablet (25 mg total) by mouth 2 (two) times daily.   No facility-administered encounter medications on file as of 01/02/2021.    Past Medical History:  Diagnosis Date  . AAA (abdominal aortic aneurysm) ([place])    Remote ~1994   . Anemia   . Arthritis   . Atrial fibrillation [facility])    Diagnosed December 2014  . AVNRT (AV nodal re-entry tachycardia) ([place])    Possible  . Cataract    Bilateral  . Cirrhosis ([place])   . CKD (chronic kidney disease) stage 3, GFR 30-59 ml/min (H[facility])   . Essential hypertension   . Gout   . History of GI bleed   . History of gout   . History of hepatitis   . History of radiation therapy 07/18/2020-08/06/2020   L Lung SBRT; Dr. Gery Pray  . History of stroke   . Hyperlipidemia   . PAD (peripheral artery disease) (H[facility])    Stenting in lower extremities (no records)  . Prostate cancer ([place])    XRT; in remission  . Type 2 diabetes mellitus ([place])     Past Surgical History:  Procedure Laterality Date  . ABDOMINAL AORTIC ANEURYSM REPAIR  1980's   in [place]  . APPENDECTOMY    . CAROTID ENDARTERECTOMY Right ~ 1999  . CATARACT EXTRACTION W/ INTRAOCULAR LENS  IMPLANT, BILATERAL Bilateral 2014  . CHOLECYSTECTOMY    . COLON SURGERY    . PARTIAL COLECTOMY     "I was bleeding to death inside; took out 2/3 of my colon" (08/22/2013)    Social History   Socioeconomic History  . Marital status: Married    Spouse name: Not on file  . Number of children: Not on file  . Years of education: Not on file  . Highest education level: Not on file  Occupational History  . Not on file  Tobacco Use  . Smoking status: Former Smoker    Packs/day: 1.00    Years: 70.00    Pack years: 70.00    Types: Cigarettes    Start date: 09/11/1944    Quit date: 02/17/2020    Years since quitting: 0.8  . Smokeless tobacco: Former Systems developer    Types: Snuff, Chew  Vaping Use  . Vaping Use: Never used   Substance and Sexual Activity  . Alcohol use: Not Currently    Alcohol/week: 0.0 standard drinks    Comment: 08/22/2013 "drink ~ 1 beer and wine/yr"  . Drug use: No  . Sexual activity: Never  Other Topics Concern  . Not on file  Social History Narrative   From [place]. Relocated to this area from [facility].   Social Determinants of Health   Financial Resource Strain: Not on file  Food Insecurity: Not on file  Transportation Needs: Not on file  Physical Activity: Inactive  . Days of Exercise per Week: 0 days  . Minutes of Exercise per Session: 0 min  Stress: Not on file  Social Connections: Not on file  Intimate Partner Violence: Not At Risk  . Fear of Current or Ex-Partner: No  . Emotionally Abused: No  . Physically Abused: No  . Sexually Abused: No    Family History  Problem Relation Age of Onset  . Cancer Mother        uterine deceased age 63  . Hypertension Sister   . Arrhythmia Other        Atrial fibrillation  . Hypertension Maternal Grandmother        Objective: Vitals:   01/02/21 1307  BP: (!) 142/83  Pulse: 81  Temp: 97.6 F (36.4 C)     Physical Exam  Lab Results:  No results for input(s): HGB, HCT, PLT in the last 72 hours.  Invalid input(s):  WBC BMET No results for input(s): NA, K, CL, CO2, GLUCOSE, BUN, CREATININE, CALCIUM in the last 72 hours. PSA 1.5 04/02/20  0.8 12/20 0.4 in 3/20.    Lab Results  Component Value Date   PSA1 1.7 01/01/2021   PSA1 2.0 08/20/2020   PSA1 1.5 04/02/2020    No results found for this or any previous visit (from the past 24 hour(s)). UA is clear.  Results for orders placed or performed in visit on 01/02/21 (from the past 24 hour(s))  Urinalysis, Routine w reflex microscopic     Status: Abnormal   Collection Time: 01/02/21  1:45 PM  Result Value Ref Range   Specific Gravity, UA 1.020 1.005 - 1.030   pH, UA 5.5 5.0 - 7.5   Color, UA Yellow Yellow   Appearance Ur Clear Clear   Leukocytes,UA  Negative Negative   Protein,UA 1+ (A) Negative/Trace   Glucose, UA Negative Negative   Ketones, UA Negative Negative   RBC, UA Negative Negative   Bilirubin, UA Negative Negative   Urobilinogen, Ur 0.2 0.2 - 1.0 mg/dL   Nitrite, UA Negative Negative   Microscopic Examination Comment    Narrative   Performed at:  [facility] [address], [place], [place]  [id number] Lab Director: Mina Marble MT, Phone:  [phone]    Studies/Results:      Assessment & Plan: 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  His PSA is back down some with a castrate testosterone.  He will return in 3 months with labs for his next Lupron.         Orders Placed This Encounter  Procedures  . Urinalysis, Routine w reflex microscopic  . PSA    Standing Status:   Future    Standing Expiration Date:   05/04/2021  . Testosterone    Standing Status:   Future    Standing Expiration Date:   05/04/2021      Return in about 3 months (around 04/03/2021) for after 04/02/21 for Lupron 45mg  with labs. .     CCJanora Norlander, DO    Irine Seal 01/02/2021 Patient ID: Erik Mullins, male   DOB: July 20, 1931, 87 y.o.   MRN: [id number] Patient ID:  Erik Mullins, male   DOB: Dec 29, 1930, 87 y.o.   MRN: [id number]

## 2021-01-06 ENCOUNTER — Telehealth: Payer: Self-pay | Admitting: Internal Medicine

## 2021-01-06 NOTE — Telephone Encounter (Signed)
Spoke with patient wife, [name], per DPR who was calling to get patient's PFT results. PFT's completed on 12/31/2020. Dr Melvyn Novas please advise.

## 2021-01-07 NOTE — Progress Notes (Signed)
Spoke with pt's spouse and notified of results per Dr. Wert. She verbalized understanding and denied any questions. 

## 2021-01-08 ENCOUNTER — Encounter: Payer: Self-pay | Admitting: *Deleted

## 2021-01-13 NOTE — Progress Notes (Addendum)
Cardiology Office Note  Date: 01/14/2021   ID: Erik Mullins, DOB [birth year], MRN [id number]  PCP:  Janora Norlander, DO  Cardiologist:  Rozann Lesches, MD Electrophysiologist:  None   Chief Complaint:  Persistent atrial fibrillation   History of Present Illness: Erik Mullins is a 87 y.o. male with a history of permanent atrial fibrillation, CKD IIIb, HTN, PSVT, AAA, carotid artery stenosis, COPD, CKD stage III, HLD, tobacco user, history of non-small cell lung CA.  Last encounter with Dr. Domenic Polite on 05/03/2020.  Plan was to continue heart rate control strategy and anticoagulation for atrial fibrillation.  She was continuing Lopressor and regularly adjusted Xarelto without bleeding.  CKD with creatinine 8.24 systolic blood pressures were in the 140s.  He was to continue lisinopril and Lopressor.   He was last here for early follow-up.  Initially the patient wanted to be seen sooner due to decrease in renal function on lab work.  Labs on 08/06/2020 demonstrated increasing creatinine of 2.16 with GFR of 31.  Subsequent lab work on 08/20/2020 showed return to baseline of 1.83 creatinine and GFR of 38.  The labs were lab work 1 year prior on March 09, 2019 when creatinine was 1.84 and GFR was 38.  He denied any recent anginal or exertional symptoms, orthostatic symptoms, palpitations or arrhythmias, CVA or TIA-like symptoms, PND, orthopnea.  No bleeding.  No claudication-like symptoms, DVT or PE-like symptoms, or lower extremity edema.   He is here today for 42-month follow-up.  Complaining of increased SOB/DOE on exertion.  Recently had PFTs with Dr. Melvyn Novas pulmonology which showed evidence of probable moderately severe obstructive airway disease with severe diffusion defect.  He has a history of non-small cell lung CA being followed by Dr. Delton Coombes.    Past Medical History:  Diagnosis Date  . AAA (abdominal aortic aneurysm) ([place])    Remote ~1994   . Anemia   . Arthritis   . Atrial  fibrillation [facility])    Diagnosed December 2014  . AVNRT (AV nodal re-entry tachycardia) ([place])    Possible  . Cataract    Bilateral  . Cirrhosis ([place])   . CKD (chronic kidney disease) stage 3, GFR 30-59 ml/min (HCC)   . Essential hypertension   . Gout   . History of GI bleed   . History of gout   . History of hepatitis   . History of radiation therapy 07/18/2020-08/06/2020   L Lung SBRT; Dr. Gery Pray  . History of stroke   . Hyperlipidemia   . PAD (peripheral artery disease) (HCC)    Stenting in lower extremities (no records)  . Prostate cancer ([place])    XRT; in remission  . Type 2 diabetes mellitus ([place])     Past Surgical History:  Procedure Laterality Date  . ABDOMINAL AORTIC ANEURYSM REPAIR  1980's   in [place]  . APPENDECTOMY    . CAROTID ENDARTERECTOMY Right ~ 1999  . CATARACT EXTRACTION W/ INTRAOCULAR LENS  IMPLANT, BILATERAL Bilateral 2014  . CHOLECYSTECTOMY    . COLON SURGERY    . PARTIAL COLECTOMY     "I was bleeding to death inside; took out 2/3 of my colon" (08/22/2013)    Current Outpatient Medications  Medication Sig Dispense Refill  . acetaminophen (TYLENOL) 500 MG tablet Take 1,000 mg by mouth every 6 (six) hours as needed for headache.    . albuterol (VENTOLIN HFA) 108 (90 Base) MCG/ACT inhaler Inhale 1-2 puffs into the lungs every 6 (  six) hours as needed for wheezing or shortness of breath. (Patient taking differently: Inhale 2 puffs into the lungs every 6 (six) hours as needed for wheezing or shortness of breath.) 18 g 2  . allopurinol (ZYLOPRIM) 100 MG tablet TAKE 1 TABLET BY MOUTH EVERY DAY 90 tablet 0  . betamethasone valerate ointment (VALISONE) 0.1 % Apply 1 application topically 2 (two) times daily. Avoid face, axilla and groin. (use as needed for psoriasis flares) 90 g 1  . Calcium Carbonate-Vitamin D 600-400 MG-UNIT tablet Take 1 tablet by mouth daily.    . Carboxymethylcellulose Sodium (ARTIFICIAL TEARS OP) Place 1 drop into both eyes  daily as needed (dry eyes).    Marland Kitchen docusate sodium (COLACE) 250 MG capsule Take 250 mg by mouth at bedtime.    . furosemide (LASIX) 20 MG tablet Take 1 tablet (20 mg total) by mouth daily. 90 tablet 1  . memantine (NAMENDA) 5 MG tablet Take 1 tablet (5 mg total) by mouth 2 (two) times daily. (Patient taking differently: Take 2.5 mg by mouth 2 (two) times daily.) 180 tablet 3  . mirtazapine (REMERON) 7.5 MG tablet TAKE 1 TABLET BY MOUTH AT BEDTIME. 90 tablet 1  . Multiple Vitamin (MULTIVITAMIN) tablet Take 1 tablet by mouth daily.    Marland Kitchen umeclidinium-vilanterol (ANORO ELLIPTA) 62.5-25 MCG/INH AEPB Inhale 1 puff into the lungs daily. (Patient taking differently: Inhale 1 puff into the lungs daily as needed (shortness of breath).) 60 each 12  . XARELTO 15 MG TABS tablet TAKE 1 TABLET (15 MG TOTAL) BY MOUTH DAILY WITH SUPPER. 90 tablet 0  . metoprolol tartrate (LOPRESSOR) 25 MG tablet Take 1 tablet (25 mg total) by mouth 2 (two) times daily. 180 tablet 3   No current facility-administered medications for this visit.   Allergies:  Other and Morphine   Social History: The patient  reports that he quit smoking about 10 months ago. His smoking use included cigarettes. He started smoking about 76 years ago. He has a 70.00 pack-year smoking history. He has quit using smokeless tobacco.  His smokeless tobacco use included snuff and chew. He reports previous alcohol use. He reports that he does not use drugs.   Family History: The patient's family history includes Arrhythmia in an other family member; Cancer in his mother; Hypertension in his maternal grandmother and sister.   ROS:  Please see the history of present illness. Otherwise, complete review of systems is positive for none.  All other systems are reviewed and negative.   Physical Exam: VS:  BP 138/82   Pulse 85   Ht 6' (1.829 m)   Wt 183 lb (83 kg)   SpO2 91%   BMI 24.82 kg/m , BMI Body mass index is 24.82 kg/m.  Wt Readings from Last 3  Encounters:  01/14/21 183 lb (83 kg)  01/02/21 180 lb (81.6 kg)  12/03/20 184 lb (83.5 kg)    General: Patient appears comfortable at rest. Neck: Supple, no elevated JVP or carotid bruits, no thyromegaly. Lungs: Mild inspiratory and expiratory wheezing, prolonged expiratory phase, nonlabored breathing at rest. Cardiac: Irregularly irregular rate and rhythm, no S3 or significant systolic murmur, no pericardial rub. Extremities: No pitting edema, distal pulses 2+. Skin: Warm and dry. Musculoskeletal: No kyphosis. Neuropsychiatric: Alert and oriented x3, affect grossly appropriate.  ECG:  EKG October 16, 2019 showed rate controlled atrial fibrillation rate of 83  Recent Labwork: 11/04/2020: ALT 14; AST 21; BUN 32; Creatinine, Ser 2.00; Hemoglobin 14.2; Platelets 164; Potassium 4.9;  Sodium 139     Component Value Date/Time   CHOL 194 11/29/2017 0905   CHOL 122 02/07/2013 0903   TRIG 187 (H) 11/29/2017 0905   TRIG 168 (H) 10/24/2013 0909   TRIG 140 02/07/2013 0903   HDL 58 11/29/2017 0905   HDL 44 10/24/2013 0909   HDL 41 02/07/2013 0903   CHOLHDL 3.3 11/29/2017 0905   LDLCALC 99 11/29/2017 0905   LDLCALC 69 10/24/2013 0909   LDLCALC 53 02/07/2013 0903    Other Studies Reviewed Today:  Echocardiogram 08/23/2013: Study Conclusions  Left ventricle: The cavity size was normal. Wall thickness was normal. Systolic function was normal. The estimated ejection fraction was in the range of 60% to 65%.   Assessment and Plan:  1. Permanent atrial fibrillation (HCC)   2. Stage 3b chronic kidney disease ([place])   3. Essential hypertension, benign   4. SOB (shortness of breath)   5. Abnormal pulmonary function test    1. Permanent atrial fibrillation (HCC) Permanent atrial fibrillation with rate control.  Pulses irregularly irregular with a rate of 85 today.  Continue continue metoprolol 25 mg p.o. twice daily.  Continue Xarelto 15 mg p.o. daily  2. Stage 3b chronic kidney disease  (HCC) Recent creatinine 1.83 with GFR of 38.  This appears to be his baseline based on blood work 1 year ago in June when creatinine was 1.84 and GFR was 38.  Recent labs on 11/04/2020 showed decreased renal function with creatinine of 2.00 and GFR of 32.   3. Essential hypertension, benign BP today 138/82.  Continue metoprolol 25 mg p.o. twice daily.  Continue Lasix 20 mg daily.   4.  Shortness of breath/DOE Complaining of increasing shortness of breath/DOE.  States mild activity causes increasing shortness of breath.  Recent calcifications of aortic valve on chest CT 11/04/2020 recommended echocardiographic correlation for evaluation of potential valvular dysfunction if clinically indicated.  Please recheck echocardiogram to reassess LV function, diastolic function and valvular function.  5.  Abnormal PFTs / History of non-small cell lung CA Recently saw pulmonology Dr. Melvyn Novas who performed PFTs on 12/31/2020. Pulmonary function diagnosis: Probable moderately severe obstructive airways disease with severe diffusion defect.  He has an upcoming follow-up with Dr. Melvyn Novas on Jan 28, 2021.  Has a follow-up CT chest without contrast scheduled on 02/11/2021 due to history of  non-small cell lung CA.  Most recent CT scan on 11/04/2020 demonstrated a positive response to therapy with some regression of the previously noted left upper lobe pulmonary nodule.  Stable 6 mm pulmonary nodule in the medial segment of the right middle lobe.  CT scan also mentioned calcification of aortic valve and recommended possible echocardiographic correlation for evaluation of potential valvular dysfunction if clinically indicated.  He follows with Dr. Delton Coombes.  Medication Adjustments/Labs and Tests Ordered: Current medicines are reviewed at length with the patient today.  Concerns regarding medicines are outlined above.   Disposition: Follow-up with Dr. Domenic Polite or APP 6 months.  Signed, Levell July, NP 01/14/2021 5:06 PM     [facility] at [place], [place], [place] [address] Phone: [phone]; Fax: [phone]

## 2021-01-14 ENCOUNTER — Ambulatory Visit (INDEPENDENT_AMBULATORY_CARE_PROVIDER_SITE_OTHER): Payer: Medicare Other | Admitting: Family Medicine

## 2021-01-14 ENCOUNTER — Ambulatory Visit (INDEPENDENT_AMBULATORY_CARE_PROVIDER_SITE_OTHER): Payer: Medicare Other

## 2021-01-14 ENCOUNTER — Encounter: Payer: Self-pay | Admitting: Family Medicine

## 2021-01-14 ENCOUNTER — Telehealth: Payer: Self-pay | Admitting: Family Medicine

## 2021-01-14 VITALS — BP 138/82 | HR 85 | Ht 72.0 in | Wt 183.0 lb

## 2021-01-14 DIAGNOSIS — I4821 Permanent atrial fibrillation: Secondary | ICD-10-CM

## 2021-01-14 DIAGNOSIS — N1832 Chronic kidney disease, stage 3b: Secondary | ICD-10-CM | POA: Diagnosis not present

## 2021-01-14 DIAGNOSIS — R0602 Shortness of breath: Secondary | ICD-10-CM

## 2021-01-14 DIAGNOSIS — I10 Essential (primary) hypertension: Secondary | ICD-10-CM

## 2021-01-14 DIAGNOSIS — R942 Abnormal results of pulmonary function studies: Secondary | ICD-10-CM

## 2021-01-14 NOTE — Telephone Encounter (Signed)
percert for Echo   3/0/8657 at [facility]

## 2021-01-14 NOTE — Patient Instructions (Signed)
Medication Instructions:  Continue all current medications.  Labwork: none  Testing/Procedures:  Your physician has requested that you have an echocardiogram. Echocardiography is a painless test that uses sound waves to create images of your heart. It provides your doctor with information about the size and shape of your heart and how well your heart's chambers and valves are working. This procedure takes approximately one hour. There are no restrictions for this procedure.  Office will contact with results via phone or letter.    Follow-Up: 6-8 weeks   Any Other Special Instructions Will Be Listed Below (If Applicable).  If you need a refill on your cardiac medications before your next appointment, please call your pharmacy.

## 2021-01-15 LAB — ECHOCARDIOGRAM COMPLETE
Area-P 1/2: 2.08 cm2
Calc EF: 63.8 %
Height: 72 in
MV M vel: 4.26 m/s
MV Peak grad: 72.5 mmHg
S' Lateral: 2.23 cm
Single Plane A2C EF: 64.4 %
Single Plane A4C EF: 63.5 %
Weight: 2928 oz

## 2021-01-17 ENCOUNTER — Telehealth: Payer: Self-pay | Admitting: *Deleted

## 2021-01-17 NOTE — Telephone Encounter (Signed)
Laurine Blazer, LPN  10/21/5168 0:17 PM EDT Back to Top     Wife ([name]) notified. Copy to pcp.    Verta Ellen., NP  01/15/2021 5:28 PM EDT      Please call the patient and let him know the echocardiogram shows there is good pumping function of his heart. He has a mildly leaking valve on the left side. This is not uncommon with aging.

## 2021-01-20 ENCOUNTER — Other Ambulatory Visit: Payer: Self-pay | Admitting: Family Medicine

## 2021-01-20 DIAGNOSIS — J449 Chronic obstructive pulmonary disease, unspecified: Secondary | ICD-10-CM

## 2021-01-26 ENCOUNTER — Other Ambulatory Visit: Payer: Self-pay | Admitting: Family Medicine

## 2021-01-26 DIAGNOSIS — N183 Chronic kidney disease, stage 3 unspecified: Secondary | ICD-10-CM

## 2021-01-28 ENCOUNTER — Encounter: Payer: Self-pay | Admitting: Internal Medicine

## 2021-01-28 ENCOUNTER — Ambulatory Visit (INDEPENDENT_AMBULATORY_CARE_PROVIDER_SITE_OTHER): Payer: Medicare Other | Admitting: Internal Medicine

## 2021-01-28 ENCOUNTER — Other Ambulatory Visit: Payer: Self-pay

## 2021-01-28 DIAGNOSIS — J449 Chronic obstructive pulmonary disease, unspecified: Secondary | ICD-10-CM

## 2021-01-28 NOTE — Progress Notes (Signed)
Erik Mullins, male    DOB: [birth year]     MRN: [id number]   Brief patient profile:  79 yowm quit smoking in June 2021 with doe then on prn saba and added anoro x end of 2021 but not helping so referred to pulmonary clinic in [facility]  12/03/2020 by Dr  Erik Mullins      History of Present Illness  12/03/2020  Pulmonary/ 1st office eval/Erik Mullins  Chief Complaint  Patient presents with  . Pulmonary Consult    Referred by Dr. Ronnie Mullins. Pt c/o DOE for the past 8-10 months. He does okay on flat surface but winded anytime walking up an incline. He has an albuterol inhaler 1-2 x per day on average.   Dyspnea:  Driveway slt decline has trouble dragging trash can back  Up to house due to sob but also bad back  Cough: none  Sleep: bed is flat/ one pillow  SABA use: sev times per day ? If helps/ technique is poor rec Plan A = Automatic = Always=    anoro one click each am  Work on inhaler technique:     Plan B = Backup (to supplement plan A, not to replace it) Only use your albuterol inhaler as a rescue medication t Try albuterol 15 min before an activity that you know would make you short of breath     Ok to try off anoro to see  what if any difference it makes in activity tolerance or need for albuterol  Please schedule a follow up office visit in 6 weeks, call sooner if needed with PFTs Bring your inhalers when return     PFTs  12/21/20 not interpretable    01/28/2021  f/u ov/Erik Mullins office/Erik Mullins re:   Copd ? Stage  Chief Complaint  Patient presents with  . Follow-up    Breathing has improved some on Anoro. He uses his albuterol inhaler 1-2 x per day on average.    Dyspnea:  Better but still can't do trash, has not tried saba first  Cough: daytime urge to clear throat / no excess mucus  Sleeping: bed is flat/ L  [place] , one pillow  SABA use: as above / never pre challenges as rec  02: none Covid status: vax x 3     No obvious day to day or daytime variability or assoc  excess/ purulent sputum or mucus plugs or hemoptysis or cp or chest tightness, subjective wheeze or overt sinus or hb symptoms.   Sleeping  without nocturnal  or early am exacerbation  of respiratory  c/o's or need for noct saba. Also denies any obvious fluctuation of symptoms with weather or environmental changes or other aggravating or alleviating factors except as outlined above   No unusual exposure hx or h/o childhood pna/ asthma or knowledge of premature birth.  Current Allergies, Complete Past Medical History, Past Surgical History, Family History, and Social History were reviewed in [employer] record.  ROS  The following are not active complaints unless bolded Hoarseness, sore throat, dysphagia, dental problems, itching, sneezing,  nasal congestion or discharge of excess mucus or purulent secretions, ear ache,   fever, chills, sweats, unintended wt loss or wt gain, classically pleuritic or exertional cp,  orthopnea pnd or arm/hand swelling  or leg swelling, presyncope, palpitations, abdominal pain, anorexia, nausea, vomiting, diarrhea  or change in bowel habits or change in bladder habits, change in stools or change in urine, dysuria, hematuria,  rash, arthralgias, visual complaints,  headache, numbness, weakness or ataxia or problems with walking or coordination,  change in mood or  memory.        Current Meds  Medication Sig  . acetaminophen (TYLENOL) 500 MG tablet Take 1,000 mg by mouth every 6 (six) hours as needed for headache.  . albuterol (VENTOLIN HFA) 108 (90 Base) MCG/ACT inhaler Inhale 1-2 puffs into the lungs every 6 (six) hours as needed for wheezing or shortness of breath. (Patient taking differently: Inhale 2 puffs into the lungs every 6 (six) hours as needed for wheezing or shortness of breath.)  . allopurinol (ZYLOPRIM) 100 MG tablet TAKE 1 TABLET BY MOUTH EVERY DAY  . ANORO ELLIPTA 62.5-25 MCG/INH AEPB TAKE 1 PUFF BY MOUTH EVERY DAY  . betamethasone  valerate ointment (VALISONE) 0.1 % Apply 1 application topically 2 (two) times daily. Avoid face, axilla and groin. (use as needed for psoriasis flares)  . Calcium Carbonate-Vitamin D 600-400 MG-UNIT tablet Take 1 tablet by mouth daily.  . Carboxymethylcellulose Sodium (ARTIFICIAL TEARS OP) Place 1 drop into both eyes daily as needed (dry eyes).  Marland Kitchen docusate sodium (COLACE) 250 MG capsule Take 250 mg by mouth at bedtime.  . furosemide (LASIX) 20 MG tablet Take 1 tablet (20 mg total) by mouth daily.  . memantine (NAMENDA) 5 MG tablet Take 1 tablet (5 mg total) by mouth 2 (two) times daily. (Patient taking differently: Take 2.5 mg by mouth 2 (two) times daily.)  . mirtazapine (REMERON) 7.5 MG tablet TAKE 1 TABLET BY MOUTH AT BEDTIME.  . Multiple Vitamin (MULTIVITAMIN) tablet Take 1 tablet by mouth daily.  Alveda Reasons 15 MG TABS tablet TAKE 1 TABLET (15 MG TOTAL) BY MOUTH DAILY WITH SUPPER.           Marland Kitchen           Past Medical History:  Diagnosis Date  . AAA (abdominal aortic aneurysm) ([place])    Remote ~1994   . Anemia   . Arthritis   . Atrial fibrillation [facility])    Diagnosed December 2014  . AVNRT (AV nodal re-entry tachycardia) ([place])    Possible  . Cataract    Bilateral  . Cirrhosis ([place])   . CKD (chronic kidney disease) stage 3, GFR 30-59 ml/min (HCC)   . Essential hypertension   . Gout   . History of GI bleed   . History of gout   . History of hepatitis   . History of radiation therapy 07/18/2020-08/06/2020   L Lung SBRT; Dr. Gery Pray  . History of stroke   . Hyperlipidemia   . PAD (peripheral artery disease) (HCC)    Stenting in lower extremities (no records)  . Prostate cancer ([place])    XRT; in remission  . Type 2 diabetes mellitus (HCC)       Objective:     Wt Readings from Last 3 Encounters:  01/28/21 183 lb (83 kg)  01/14/21 183 lb (83 kg)  01/02/21 180 lb (81.6 kg)      Vital signs reviewed  01/28/2021  - Note at rest 02 sats  94% on RA   General  appearance:    amb stoic wm nad    HEENT : pt wearing mask not removed for exam due to covid -19 concerns.    NECK :  without JVD/Nodes/TM/ nl carotid upstrokes bilaterally   LUNGS: no acc muscle use,  Mod barrel  contour chest wall with bilateral  Distant bs s audible wheeze and  without cough on  insp or exp maneuvers and mod  Hyperresonant  to  percussion bilaterally     CV:  RRR  no s3 or murmur or increase in P2, and no edema   ABD:  soft and nontender with pos mid insp Hoover's  in the supine position. No bruits or organomegaly appreciated, bowel sounds nl  MS:     ext warm without deformities, calf tenderness, cyanosis or clubbing No obvious joint restrictions   SKIN: warm and dry without lesions    NEURO:  alert, approp, nl sensorium with  no motor or cerebellar deficits apparent.                  Assessment                         12/03/2020

## 2021-01-28 NOTE — Assessment & Plan Note (Addendum)
Quit smoking 02/2020 and breathing worse since  - 12/03/2020   Walked RA  approx 200 ft  average pace/min SOB/stopped due to leg fatigue  - PFTs  12/21/20 not interpretable     Pt is Group B in terms of symptom/risk and laba/lama therefore appropriate rx at this point >>>  Continue anoro plus prn saba  I spent extra time with pt today reviewing appropriate use of albuterol for prn use on exertion with the following points: 1) saba is for relief of sob that does not improve by walking a slower pace or resting but rather if the pt does not improve after trying this first. 2) If the pt is convinced, as many are, that saba helps recover from activity faster then it's easy to tell if this is the case by re-challenging : ie stop, take the inhaler, then p 5 minutes try the exact same activity (intensity of workload) that just caused the symptoms and see if they are substantially diminished or not after saba 3) if there is an activity that reproducibly causes the symptoms, try the saba 15 min before the activity on alternate days   If in fact the saba really does help, then fine to continue to use it prn but advised may need to look closer at the maintenance regimen being used to achieve better control of airways disease with exertion.    >>>  F/u q 6 months          Each maintenance medication was reviewed in detail including emphasizing most importantly the difference between maintenance and prns and under what circumstances the prns are to be triggered using an action plan format where appropriate.  Total time for H and P, chart review, counseling, reviewing elipta  device(s) and generating customized AVS unique to this office visit / same day charting = 25 min

## 2021-01-28 NOTE — Patient Instructions (Addendum)
Plan A = Automatic = Always=    Anoro one click each am  - take two good drags   Plan B = Backup (to supplement plan A, not to replace it) Only use your albuterol inhaler as a rescue medication to be used if you can't catch your breath by resting or doing a relaxed purse lip breathing pattern.  - The less you use it, the better it will work when you need it. - Ok to use the inhaler up to 2 puffs  every 4 hours if you must but call for appointment if use goes up over your usual need - Don't leave home without it !!  (think of it like the spare tire for your car)    Try albuterol 15 min before an activity(like the trash can)  on alternating days  that you know would make you short of breath and see if it makes any difference and if makes none then don't take albuterol after activity unless you can't catch your breath as this means it's the resting that helps, not the albuterol.   Please schedule a follow up visit in 6  months but call sooner if needed

## 2021-02-07 ENCOUNTER — Other Ambulatory Visit (HOSPITAL_COMMUNITY): Payer: Self-pay | Admitting: *Deleted

## 2021-02-07 DIAGNOSIS — Z8546 Personal history of malignant neoplasm of prostate: Secondary | ICD-10-CM

## 2021-02-07 DIAGNOSIS — C3492 Malignant neoplasm of unspecified part of left bronchus or lung: Secondary | ICD-10-CM

## 2021-02-11 ENCOUNTER — Ambulatory Visit (HOSPITAL_COMMUNITY)
Admission: RE | Admit: 2021-02-11 | Discharge: 2021-02-11 | Disposition: A | Discharge status: Home / Self Care | Payer: Medicare Other | Source: Ambulatory Visit | Attending: Hematology | Admitting: Hematology

## 2021-02-11 ENCOUNTER — Inpatient Hospital Stay (HOSPITAL_COMMUNITY): Payer: Medicare Other | Attending: Hematology

## 2021-02-11 ENCOUNTER — Ambulatory Visit: Payer: Medicare Other

## 2021-02-11 ENCOUNTER — Other Ambulatory Visit: Payer: Self-pay

## 2021-02-11 DIAGNOSIS — C3492 Malignant neoplasm of unspecified part of left bronchus or lung: Secondary | ICD-10-CM | POA: Insufficient documentation

## 2021-02-11 DIAGNOSIS — Z8546 Personal history of malignant neoplasm of prostate: Secondary | ICD-10-CM

## 2021-02-11 DIAGNOSIS — C3490 Malignant neoplasm of unspecified part of unspecified bronchus or lung: Secondary | ICD-10-CM | POA: Diagnosis not present

## 2021-02-11 DIAGNOSIS — S2241XA Multiple fractures of ribs, right side, initial encounter for closed fracture: Secondary | ICD-10-CM | POA: Diagnosis not present

## 2021-02-11 DIAGNOSIS — C3412 Malignant neoplasm of upper lobe, left bronchus or lung: Secondary | ICD-10-CM | POA: Diagnosis not present

## 2021-02-11 DIAGNOSIS — C61 Malignant neoplasm of prostate: Secondary | ICD-10-CM | POA: Diagnosis not present

## 2021-02-11 DIAGNOSIS — I2510 Atherosclerotic heart disease of native coronary artery without angina pectoris: Secondary | ICD-10-CM | POA: Diagnosis not present

## 2021-02-11 DIAGNOSIS — J432 Centrilobular emphysema: Secondary | ICD-10-CM | POA: Diagnosis not present

## 2021-02-11 DIAGNOSIS — Z87891 Personal history of nicotine dependence: Secondary | ICD-10-CM | POA: Diagnosis not present

## 2021-02-11 LAB — CBC WITH DIFFERENTIAL/PLATELET
Abs Immature Granulocytes: 0.02 10*3/uL (ref 0.00–0.07)
Basophils Absolute: 0.1 10*3/uL (ref 0.0–0.1)
Basophils Relative: 1 %
Eosinophils Absolute: 0.2 10*3/uL (ref 0.0–0.5)
Eosinophils Relative: 3 %
HCT: 46.5 % (ref 39.0–52.0)
Hemoglobin: 14.7 g/dL (ref 13.0–17.0)
Immature Granulocytes: 0 %
Lymphocytes Relative: 16 %
Lymphs Abs: 1.1 10*3/uL (ref 0.7–4.0)
MCH: 29.1 pg (ref 26.0–34.0)
MCHC: 31.6 g/dL (ref 30.0–36.0)
MCV: 92.1 fL (ref 80.0–100.0)
Monocytes Absolute: 0.5 10*3/uL (ref 0.1–1.0)
Monocytes Relative: 7 %
Neutro Abs: 5.1 10*3/uL (ref 1.7–7.7)
Neutrophils Relative %: 73 %
Platelets: 192 10*3/uL (ref 150–400)
RBC: 5.05 MIL/uL (ref 4.22–5.81)
RDW: 14.5 % (ref 11.5–15.5)
WBC: 6.9 10*3/uL (ref 4.0–10.5)
nRBC: 0 % (ref 0.0–0.2)

## 2021-02-11 LAB — COMPREHENSIVE METABOLIC PANEL
ALT: 14 U/L (ref 0–44)
AST: 21 U/L (ref 15–41)
Albumin: 4 g/dL (ref 3.5–5.0)
Alkaline Phosphatase: 102 U/L (ref 38–126)
Anion gap: 12 (ref 5–15)
BUN: 30 mg/dL — ABNORMAL HIGH (ref 8–23)
CO2: 25 mmol/L (ref 22–32)
Calcium: 9.1 mg/dL (ref 8.9–10.3)
Chloride: 101 mmol/L (ref 98–111)
Creatinine, Ser: 2.05 mg/dL — ABNORMAL HIGH (ref 0.61–1.24)
GFR, Estimated: 31 mL/min — ABNORMAL LOW (ref >60)
Glucose, Bld: 132 mg/dL — ABNORMAL HIGH (ref 70–99)
Potassium: 3.7 mmol/L (ref 3.5–5.1)
Sodium: 138 mmol/L (ref 135–145)
Total Bilirubin: 0.5 mg/dL (ref 0.3–1.2)
Total Protein: 7.4 g/dL (ref 6.5–8.1)

## 2021-02-11 LAB — PSA: Prostatic Specific Antigen: 2.26 ng/mL (ref 0.00–4.00)

## 2021-02-13 ENCOUNTER — Ambulatory Visit: Payer: Medicare Other | Admitting: Cardiology

## 2021-02-15 LAB — TESTOSTERONE, % FREE: Testosterone-% Free: 0.5 % (ref 0.2–0.7)

## 2021-02-18 ENCOUNTER — Other Ambulatory Visit: Payer: Self-pay | Admitting: Family Medicine

## 2021-02-18 ENCOUNTER — Inpatient Hospital Stay (HOSPITAL_COMMUNITY): Payer: Medicare Other | Attending: Hematology and Oncology | Admitting: Hematology and Oncology

## 2021-02-18 ENCOUNTER — Other Ambulatory Visit: Payer: Self-pay | Admitting: Family

## 2021-02-18 ENCOUNTER — Other Ambulatory Visit: Payer: Self-pay

## 2021-02-18 VITALS — BP 129/80 | HR 90 | Temp 97.7°F | Resp 18 | Wt 183.3 lb

## 2021-02-18 DIAGNOSIS — Z8546 Personal history of malignant neoplasm of prostate: Secondary | ICD-10-CM | POA: Diagnosis not present

## 2021-02-18 DIAGNOSIS — C61 Malignant neoplasm of prostate: Secondary | ICD-10-CM | POA: Insufficient documentation

## 2021-02-18 DIAGNOSIS — C3412 Malignant neoplasm of upper lobe, left bronchus or lung: Secondary | ICD-10-CM | POA: Insufficient documentation

## 2021-02-18 DIAGNOSIS — C3492 Malignant neoplasm of unspecified part of left bronchus or lung: Secondary | ICD-10-CM

## 2021-02-18 DIAGNOSIS — Z87891 Personal history of nicotine dependence: Secondary | ICD-10-CM | POA: Diagnosis not present

## 2021-02-18 DIAGNOSIS — F418 Other specified anxiety disorders: Secondary | ICD-10-CM

## 2021-02-18 DIAGNOSIS — N189 Chronic kidney disease, unspecified: Secondary | ICD-10-CM | POA: Insufficient documentation

## 2021-02-18 DIAGNOSIS — N183 Chronic kidney disease, stage 3 unspecified: Secondary | ICD-10-CM

## 2021-02-18 NOTE — Progress Notes (Signed)
[name], [place] [address]   CLINIC:  Medical Oncology/Hematology  PCP:  Janora Norlander, DO [address] [place] [address] [phone]   REASON FOR VISIT:  Follow-up for left squamous cell lung cancer  PRIOR THERAPY: SBRT to left upper lung 54 Gy in 3 fractions through 08/06/2020  NGS Results: Not done  CURRENT THERAPY: Surveillance  BRIEF ONCOLOGIC HISTORY:  Oncology History   No history exists.    CANCER STAGING: Cancer Staging No matching staging information was found for the patient.  INTERVAL HISTORY:  Erik Mullins, a 87 y.o. male, returns for routine follow-up of his left squamous cell lung cancer. Ewan was last seen on 11/07/2020.    On exam today Erik Mullins reports that he has been well overall in the interim since his last visit.  He reports that he has been having continued shortness of breath and is disappointed that it is not improving.  He reports that he was breathing fine prior to radiation therapy, however after radiation he became very short of breath on exertion.  He notes that he tends to be gasping for air if he walks out to the garbage can and back.  He does acknowledge that he smoked "a lifetime of cigarettes".  Which likely contributed to these findings.  He does have an occasional cough and reports that he always has phlegm in his throat.  Otherwise he denies any fevers, chills, sweats, nausea, vomiting or diarrhea.  A full 10 point ROS is listed below.   REVIEW OF SYSTEMS:  Review of Systems  Constitutional: Positive for fatigue (25%). Negative for appetite change.  HENT:   Positive for hearing loss.   Psychiatric/Behavioral: Positive for depression.  All other systems reviewed and are negative.   PAST MEDICAL/SURGICAL HISTORY:  Past Medical History:  Diagnosis Date  . AAA (abdominal aortic aneurysm) ([place])    Remote ~1994   . Anemia   . Arthritis   . Atrial fibrillation [facility])     Diagnosed December 2014  . AVNRT (AV nodal re-entry tachycardia) ([place])    Possible  . Cataract    Bilateral  . Cirrhosis ([place])   . CKD (chronic kidney disease) stage 3, GFR 30-59 ml/min (HCC)   . Essential hypertension   . Gout   . History of GI bleed   . History of gout   . History of hepatitis   . History of radiation therapy 07/18/2020-08/06/2020   L Lung SBRT; Dr. Gery Pray  . History of stroke   . Hyperlipidemia   . PAD (peripheral artery disease) (HCC)    Stenting in lower extremities (no records)  . Prostate cancer ([place])    XRT; in remission  . Type 2 diabetes mellitus ([place])    Past Surgical History:  Procedure Laterality Date  . ABDOMINAL AORTIC ANEURYSM REPAIR  1980's   in [place]  . APPENDECTOMY    . CAROTID ENDARTERECTOMY Right ~ 1999  . CATARACT EXTRACTION W/ INTRAOCULAR LENS  IMPLANT, BILATERAL Bilateral 2014  . CHOLECYSTECTOMY    . COLON SURGERY    . PARTIAL COLECTOMY     "I was bleeding to death inside; took out 2/3 of my colon" (08/22/2013)    SOCIAL HISTORY:  Social History   Socioeconomic History  . Marital status: Married    Spouse name: Not on file  . Number of children: Not on file  . Years of education: Not on file  .  Highest education level: Not on file  Occupational History  . Not on file  Tobacco Use  . Smoking status: Former Smoker    Packs/day: 1.00    Years: 70.00    Pack years: 70.00    Types: Cigarettes    Start date: 09/11/1944    Quit date: 02/17/2020    Years since quitting: 1.0  . Smokeless tobacco: Former Systems developer    Types: Snuff, Chew  Vaping Use  . Vaping Use: Never used  Substance and Sexual Activity  . Alcohol use: Not Currently    Alcohol/week: 0.0 standard drinks    Comment: 08/22/2013 "drink ~ 1 beer and wine/yr"  . Drug use: No  . Sexual activity: Never  Other Topics Concern  . Not on file  Social History Narrative   From [place]. Relocated to this area from [facility].   Social Determinants of  Health   Financial Resource Strain: Not on file  Food Insecurity: Not on file  Transportation Needs: Not on file  Physical Activity: Inactive  . Days of Exercise per Week: 0 days  . Minutes of Exercise per Session: 0 min  Stress: Not on file  Social Connections: Not on file  Intimate Partner Violence: Not At Risk  . Fear of Current or Ex-Partner: No  . Emotionally Abused: No  . Physically Abused: No  . Sexually Abused: No    FAMILY HISTORY:  Family History  Problem Relation Age of Onset  . Cancer Mother        uterine deceased age 6  . Hypertension Sister   . Arrhythmia Other        Atrial fibrillation  . Hypertension Maternal Grandmother     CURRENT MEDICATIONS:  Current Outpatient Medications  Medication Sig Dispense Refill  . acetaminophen (TYLENOL) 500 MG tablet Take 1,000 mg by mouth every 6 (six) hours as needed for headache.    . albuterol (VENTOLIN HFA) 108 (90 Base) MCG/ACT inhaler Inhale 1-2 puffs into the lungs every 6 (six) hours as needed for wheezing or shortness of breath. (Patient taking differently: Inhale 2 puffs into the lungs every 6 (six) hours as needed for wheezing or shortness of breath.) 18 g 2  . allopurinol (ZYLOPRIM) 100 MG tablet TAKE 1 TABLET BY MOUTH EVERY DAY 90 tablet 0  . ANORO ELLIPTA 62.5-25 MCG/INH AEPB TAKE 1 PUFF BY MOUTH EVERY DAY 60 each 2  . betamethasone valerate ointment (VALISONE) 0.1 % Apply 1 application topically 2 (two) times daily. Avoid face, axilla and groin. (use as needed for psoriasis flares) 90 g 1  . Calcium Carbonate-Vitamin D 600-400 MG-UNIT tablet Take 1 tablet by mouth daily.    . Carboxymethylcellulose Sodium (ARTIFICIAL TEARS OP) Place 1 drop into both eyes daily as needed (dry eyes).    Marland Kitchen docusate sodium (COLACE) 250 MG capsule Take 250 mg by mouth at bedtime.    . furosemide (LASIX) 20 MG tablet TAKE 1 TABLET BY MOUTH EVERY DAY 90 tablet 1  . memantine (NAMENDA) 5 MG tablet Take 1 tablet (5 mg total) by mouth 2  (two) times daily. (Patient taking differently: Take 2.5 mg by mouth 2 (two) times daily.) 180 tablet 3  . mirtazapine (REMERON) 7.5 MG tablet TAKE 1 TABLET BY MOUTH EVERYDAY AT BEDTIME 90 tablet 1  . Multiple Vitamin (MULTIVITAMIN) tablet Take 1 tablet by mouth daily.    Alveda Reasons 15 MG TABS tablet TAKE 1 TABLET (15 MG TOTAL) BY MOUTH DAILY WITH SUPPER. 90 tablet 0  .  metoprolol tartrate (LOPRESSOR) 25 MG tablet Take 1 tablet (25 mg total) by mouth 2 (two) times daily. 180 tablet 3   No current facility-administered medications for this visit.    ALLERGIES:  Allergies  Allergen Reactions  . Other Other (See Comments)  . Morphine Other (See Comments)    hallucinate    PHYSICAL EXAM:  Performance status (ECOG): 1 - Symptomatic but completely ambulatory  Vitals:   02/18/21 1442  BP: 129/80  Pulse: 90  Resp: 18  Temp: 97.7 F (36.5 C)  SpO2: 94%   Wt Readings from Last 3 Encounters:  02/18/21 183 lb 4.8 oz (83.1 kg)  01/28/21 183 lb (83 kg)  01/14/21 183 lb (83 kg)   Physical Exam Vitals reviewed.  Constitutional:      Appearance: Normal appearance.     Comments: In wheelchair  Cardiovascular:     Rate and Rhythm: Normal rate and regular rhythm.     Pulses: Normal pulses.     Heart sounds: Normal heart sounds.  Pulmonary:     Effort: Pulmonary effort is normal.     Breath sounds: Normal breath sounds.  Musculoskeletal:     Right lower leg: No edema.     Left lower leg: No edema.  Neurological:     General: No focal deficit present.     Mental Status: He is alert and oriented to person, place, and time.  Psychiatric:        Mood and Affect: Mood normal.        Behavior: Behavior normal.      LABORATORY DATA:  I have reviewed the labs as listed.  CBC Latest Ref Rng & Units 02/11/2021 11/04/2020 07/09/2020  WBC 4.0 - 10.5 K/uL 6.9 7.2 7.4  Hemoglobin 13.0 - 17.0 g/dL 14.7 14.2 15.3  Hematocrit 39.0 - 52.0 % 46.5 45.0 45.7  Platelets 150 - 400 K/uL 192 164 187    CMP Latest Ref Rng & Units 02/11/2021 11/04/2020 08/20/2020  Glucose 70 - 99 mg/dL 132(H) 110(H) 120(H)  BUN 8 - 23 mg/dL 30(H) 32(H) 30(H)  Creatinine 0.61 - 1.24 mg/dL 2.05(H) 2.00(H) 1.83(H)  Sodium 135 - 145 mmol/L 138 139 142  Potassium 3.5 - 5.1 mmol/L 3.7 4.9 4.8  Chloride 98 - 111 mmol/L 101 102 102  CO2 22 - 32 mmol/L 25 29 24   Calcium 8.9 - 10.3 mg/dL 9.1 9.7 9.4  Total Protein 6.5 - 8.1 g/dL 7.4 7.2 -  Total Bilirubin 0.3 - 1.2 mg/dL 0.5 0.6 -  Alkaline Phos 38 - 126 U/L 102 87 -  AST 15 - 41 U/L 21 21 -  ALT 0 - 44 U/L 14 14 -    DIAGNOSTIC IMAGING:  I have independently reviewed the scans and discussed with the patient. CT Chest Wo Contrast  Result Date: 02/11/2021 CLINICAL DATA:  Non-small cell lung cancer treated with radiation therapy in 2021. Restaging. No current complaints. EXAM: CT CHEST WITHOUT CONTRAST TECHNIQUE: Multidetector CT imaging of the chest was performed following the standard protocol without IV contrast. COMPARISON:  CT chest 11/04/2020 and PET-CT 07/08/2020. FINDINGS: Cardiovascular: Extensive atherosclerosis of the aorta, great vessels and coronary arteries again noted. No acute vascular findings on noncontrast imaging. There are calcifications of the aortic valve. The heart size is normal. There is no pericardial effusion. Mediastinum/Nodes: There are no enlarged mediastinal, hilar or axillary lymph nodes.Hilar assessment is limited by the lack of intravenous contrast, although the hilar contours appear unchanged. The thyroid gland, trachea and esophagus  demonstrate no significant findings. Lungs/Pleura: There is no pleural effusion. Mild centrilobular and paraseptal emphysema with mild diffuse central airway thickening. At the site of the previously demonstrated small left upper lobe nodule, there is new predominately linear opacity most consistent with radiation pneumonitis/fibrosis, measuring up to 4.2 x 1.7 cm on image 72/4. 6 mm right middle lobe nodule  on image 102/4 and mild scarring in both lower lobes are unchanged. No new or enlarging pulmonary nodules. Upper abdomen: The visualized upper abdomen appears stable without acute findings. Multiple cystic left renal lesions are partially imaged and grossly unchanged. Musculoskeletal/Chest wall: Probable sebaceous cyst in the left lateral chest wall measuring 16 mm on image 83/12, excluded from the field of view on most recent CT, but stable from prior PET-CT. No suspicious chest wall lesion or osseous abnormality. Old right-sided rib fractures are noted. IMPRESSION: 1. Progressive predominately linear opacity in the left upper lobe at site of previously demonstrated hypermetabolic pulmonary nodule, most consistent with radiation pneumonitis/fibrosis. Continued attention on follow-up recommended. 2. No evidence of progressive metastatic disease. 3. Stable 6 mm right middle lobe pulmonary nodule. 4. Aortic Atherosclerosis (ICD10-I70.0) and Emphysema (ICD10-J43.9). Electronically Signed   By: Richardean Sale M.D.   On: 02/11/2021 16:49     ASSESSMENT:  1. 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 -Incidental finding of left upper lobe lung nodule on Axumin PET scan done for prostate cancer. -CT biopsy on 06/18/2019 one of the left upper lobe lung nodule consistent with squamous cell carcinoma, IHC p40 positive. -PET CT scan on 07/08/2020 shows 30 mm left upper lobe nodule, SUV 10.  No hypermetabolic adenopathy.  Hypermetabolic focally in the right prostate is nonspecific.  2. Prostate cancer: -Prostate cancer, Gleason 9 diagnosed in April 2010 -Status post seed placement and XRT completed on 08/16/2009 in [place]. -PSA recurrence and was started on Lupron in 2017 after PSA rose to 9.9 in 6/17 from 3.6 in 12/16. -PSA nadired at 0.2 on Lupron in 9/18 but has been rising, 0.4 in 3/20, 0.8 in 12/20 and 1.5 on 04/02/2020. Testosterone levels remained  castrate. -Ax[facility] PET scan on 05/14/2020 shows hypermetabolic left upper lobe lung nodule. Radiation seeds in the prostate with diffuse hypermetabolic him, indeterminate. Smaller right upper lobe lung nodule is below PET resolution. No skeletal metastasis.   PLAN:  1. 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 -CT chest without contrast from 02/11/2021 showed a linear opacity in the left upper lobe at site of previously demonstrated hypermetabolic pulmonary nodule, most consistent with radiation pneumonitis/fibrosis. -Recommend follow-up in 3 months with repeat CT scan.  2. M0 castrate resistant prostate cancer: -Last Lupron injection on 10/03/2020. Next dose 04/02/2021 -Continue follow-up with Dr. Jeffie Pollock.  3. CKD: -Latest creatinine is 2.05. Has been stable.   Orders placed this encounter:  No orders of the defined types were placed in this encounter.   Ledell Peoples, MD Department of Hematology/Oncology [place] at [facility] Phone: [phone] Pager: [phone] Email: [email or web address]

## 2021-02-20 DIAGNOSIS — I70203 Unspecified atherosclerosis of native arteries of extremities, bilateral legs: Secondary | ICD-10-CM | POA: Diagnosis not present

## 2021-02-20 DIAGNOSIS — B351 Tinea unguium: Secondary | ICD-10-CM | POA: Diagnosis not present

## 2021-02-20 DIAGNOSIS — M79676 Pain in unspecified toe(s): Secondary | ICD-10-CM | POA: Diagnosis not present

## 2021-02-24 ENCOUNTER — Encounter: Payer: Self-pay | Admitting: *Deleted

## 2021-02-24 NOTE — Progress Notes (Signed)
Cardiology Office Note  Date: 02/25/2021   ID: Erik Mullins, DOB 1931-02-21, MRN [id number]  PCP:  Janora Norlander, DO  Cardiologist:  Rozann Lesches, MD Electrophysiologist:  None   Chief Complaint:  Persistent atrial fibrillation   History of Present Illness: Erik Mullins is a 87 y.o. male with a history of permanent atrial fibrillation, CKD IIIb, HTN, PSVT, AAA, carotid artery stenosis, COPD, CKD stage III, HLD, tobacco user, history of non-small cell lung CA.  Last encounter with Dr. Domenic Polite on 05/03/2020.  Plan was to continue heart rate control strategy and anticoagulation for atrial fibrillation.  She was continuing Lopressor and regularly adjusted Xarelto without bleeding.  CKD with creatinine 4.09 systolic blood pressures were in the 140s.  He was to continue lisinopril and Lopressor.   He was last here for early follow-up.  Initially the patient wanted to be seen sooner due to decrease in renal function on lab work.  Labs on 08/06/2020 demonstrated increasing creatinine of 2.16 with GFR of 31.  Subsequent lab work on 08/20/2020 showed return to baseline of 1.83 creatinine and GFR of 38.  The labs were lab work 1 year prior on March 09, 2019 when creatinine was 1.84 and GFR was 38.  He denied any recent anginal or exertional symptoms, orthostatic symptoms, palpitations or arrhythmias, CVA or TIA-like symptoms, PND, orthopnea.  No bleeding.  No claudication-like symptoms, DVT or PE-like symptoms, or lower extremity edema.   He was previously here for 22-month follow-up.  Continues to complain of increased SOB/DOE on exertion.  Recently had PFTs with Dr. Melvyn Novas pulmonology which showed evidence of probable moderately severe obstructive airway disease with severe diffusion defect.  History of non-small cell lung CA being followed by Dr. Delton Coombes.   He is here today for follow-up.  Recently had an echocardiogram on 01/14/2021 demonstrating EF of 60 to 65%.  No WMA's, indeterminant  diastolic parameters, RV size mildly enlarged, mild mitral regurgitation.  He continues to complain of shortness of breath.  He states when he takes his trash can to the curb for garbage collector's and comes back he has to stop before he reaches dialysis.  He admits to a significant history of smoking.  We discussed the recent pulmonary function test by Dr. Melvyn Novas and his history is non-small cell lung CVA.  He denies any anginal symptoms.  He denies any symptoms of racing heart.  Heart rate is controlled at rate of 85.  Blood pressure 126/72.  Had recent blood work at PCP office on 02/11/2021.  Recent creatinine 2.05 with GFR of 31, BUN of 30.  CBC was unremarkable.  Chemistry showed glucose of 132.  His wife is stating his dementia has significantly progressed over recent months.    Past Medical History:  Diagnosis Date   AAA (abdominal aortic aneurysm) ([place])    Remote ~1994    Anemia    Arthritis    Atrial fibrillation [facility])    Diagnosed December 2014   AVNRT (AV nodal re-entry tachycardia) ([place])    Possible   Cataract    Bilateral   Cirrhosis ([place])    CKD (chronic kidney disease) stage 3, GFR 30-59 ml/min (HCC)    Essential hypertension    Gout    History of GI bleed    History of gout    History of hepatitis    History of radiation therapy 07/18/2020-08/06/2020   L Lung SBRT; Dr. Gery Pray   History of stroke  Hyperlipidemia    PAD (peripheral artery disease) (HCC)    Stenting in lower extremities (no records)   Prostate cancer ([place])    XRT; in remission   Type 2 diabetes mellitus ([place])     Past Surgical History:  Procedure Laterality Date   ABDOMINAL AORTIC ANEURYSM REPAIR  1980's   in [place] ~ [place], BILATERAL Bilateral 2014   CHOLECYSTECTOMY     COLON SURGERY     PARTIAL COLECTOMY     "I was bleeding to death inside; took out 2/3 of my colon" (08/22/2013)     Current Outpatient Medications  Medication Sig Dispense Refill   acetaminophen (TYLENOL) 500 MG tablet Take 1,000 mg by mouth every 6 (six) hours as needed for headache.     albuterol (VENTOLIN HFA) 108 (90 Base) MCG/ACT inhaler Inhale 1-2 puffs into the lungs every 6 (six) hours as needed for wheezing or shortness of breath. (Patient taking differently: Inhale 2 puffs into the lungs every 6 (six) hours as needed for wheezing or shortness of breath.) 18 g 2   allopurinol (ZYLOPRIM) 100 MG tablet TAKE 1 TABLET BY MOUTH EVERY DAY 90 tablet 0   ANORO ELLIPTA 62.5-25 MCG/INH AEPB TAKE 1 PUFF BY MOUTH EVERY DAY 60 each 2   betamethasone valerate ointment (VALISONE) 0.1 % Apply 1 application topically 2 (two) times daily. Avoid face, axilla and groin. (use as needed for psoriasis flares) 90 g 1   Calcium Carbonate-Vitamin D 600-400 MG-UNIT tablet Take 1 tablet by mouth daily.     Carboxymethylcellulose Sodium (ARTIFICIAL TEARS OP) Place 1 drop into both eyes daily as needed (dry eyes).     docusate sodium (COLACE) 250 MG capsule Take 250 mg by mouth at bedtime.     furosemide (LASIX) 20 MG tablet TAKE 1 TABLET BY MOUTH EVERY DAY 90 tablet 1   memantine (NAMENDA) 5 MG tablet Take 1 tablet (5 mg total) by mouth 2 (two) times daily. (Patient taking differently: Take 2.5 mg by mouth 2 (two) times daily.) 180 tablet 3   metoprolol tartrate (LOPRESSOR) 25 MG tablet Take 1 tablet (25 mg total) by mouth 2 (two) times daily. 180 tablet 3   mirtazapine (REMERON) 7.5 MG tablet TAKE 1 TABLET BY MOUTH EVERYDAY AT BEDTIME 90 tablet 1   Multiple Vitamin (MULTIVITAMIN) tablet Take 1 tablet by mouth daily.     XARELTO 15 MG TABS tablet TAKE 1 TABLET (15 MG TOTAL) BY MOUTH DAILY WITH SUPPER. 90 tablet 0   No current facility-administered medications for this visit.   Allergies:  Other and Morphine   Social History: The patient  reports that he quit smoking about a year ago. His smoking use included cigarettes. He  started smoking about 76 years ago. He has a 70.00 pack-year smoking history. He has quit using smokeless tobacco.  His smokeless tobacco use included snuff and chew. He reports previous alcohol use. He reports that he does not use drugs.   Family History: The patient's family history includes Arrhythmia in an other family member; Cancer in his mother; Hypertension in his maternal grandmother and sister.   ROS:  Please see the history of present illness. Otherwise, complete review of systems is positive for none.  All other systems are reviewed and negative.   Physical Exam: VS:  BP 126/72   Pulse 85   Ht 5' 10.5" (1.791 m)  Wt 183 lb 9.6 oz (83.3 kg)   SpO2 92%   BMI 25.97 kg/m , BMI Body mass index is 25.97 kg/m.  Wt Readings from Last 3 Encounters:  02/25/21 183 lb 9.6 oz (83.3 kg)  02/18/21 183 lb 4.8 oz (83.1 kg)  01/28/21 183 lb (83 kg)    General: Patient appears comfortable at rest. Neck: Supple, no elevated JVP or carotid bruits, no thyromegaly. Lungs: Mild inspiratory and expiratory wheezing, prolonged expiratory phase, nonlabored breathing at rest. Cardiac: Irregularly irregular rate and rhythm, no S3 or significant systolic murmur, no pericardial rub. Extremities: No pitting edema, distal pulses 2+. Skin: Warm and dry. Musculoskeletal: No kyphosis. Neuropsychiatric: Alert and oriented x3, affect grossly appropriate.  ECG:  EKG October 16, 2019 showed rate controlled atrial fibrillation rate of 83  Recent Labwork: 02/11/2021: ALT 14; AST 21; BUN 30; Creatinine, Ser 2.05; Hemoglobin 14.7; Platelets 192; Potassium 3.7; Sodium 138     Component Value Date/Time   CHOL 194 11/29/2017 0905   CHOL 122 02/07/2013 0903   TRIG 187 (H) 11/29/2017 0905   TRIG 168 (H) 10/24/2013 0909   TRIG 140 02/07/2013 0903   HDL 58 11/29/2017 0905   HDL 44 10/24/2013 0909   HDL 41 02/07/2013 0903   CHOLHDL 3.3 11/29/2017 0905   LDLCALC 99 11/29/2017 0905   LDLCALC 69 10/24/2013 0909    LDLCALC 53 02/07/2013 0903    Other Studies Reviewed Today:   Echocardiogram 01/14/2021  1. Left ventricular ejection fraction, by estimation, is 60 to 65%. The left ventricle has normal function. The left ventricle has no regional wall motion abnormalities. Left ventricular diastolic parameters are indeterminate. 2. RV-RA gradient 41 mmHg. Right ventricular systolic function is mildly reduced. The right ventricular size is mildly enlarged. 3. The mitral valve is grossly normal. Mild mitral valve regurgitation. 4. The aortic valve is tricuspid. There is moderate calcification of the aortic valve. Aortic valve regurgitation is not visualized. Mild to moderate aortic valve sclerosis/calcification is present, without any evidence of aortic stenosis. 5. Unable to estimate CVP. Comparison(s): Echocardiogram done 08/23/13 showed an EF of 60-65%.   CT Chest  02/11/2021 IMPRESSION: 1. Progressive predominately linear opacity in the left upper lobe at site of previously demonstrated hypermetabolic pulmonary nodule, most consistent with radiation pneumonitis/fibrosis. Continued attention on follow-up recommended. 2. No evidence of progressive metastatic disease. 3. Stable 6 mm right middle lobe pulmonary nodule. 4. Aortic Atherosclerosis (ICD10-I70.0) and Emphysema (ICD10-J43.9).    Echocardiogram 08/23/2013: Study Conclusions  Left ventricle: The cavity size was normal. Wall thickness was normal. Systolic function was normal. The estimated ejection fraction was in the range of 60% to 65%.    Assessment and Plan:  1. Permanent atrial fibrillation (HCC)   2. Stage 3b chronic kidney disease ([place])   3. Essential hypertension, benign   4. SOB (shortness of breath)   5. Abnormal pulmonary function test     1. Permanent atrial fibrillation (HCC) Permanent atrial fibrillation with rate control.  Pulses irregularly irregular with a rate of 85 today.  Continue continue metoprolol 25 mg  p.o. twice daily.  Continue Xarelto 15 mg p.o. daily  2. Stage 3b chronic kidney disease (HCC) Recent creatinine 2.05 with GFR of 31, BUN of 30 on lab work from PCP office recently  3. Essential hypertension, benign BP today 126/72.  Continue metoprolol 25 mg p.o. twice daily.  Continue Lasix 20 mg daily.   4.  Shortness of breath/DOE Continues complaints of shortness of breath and DOE  with mild to moderate activity.  See recent PFTs.  Recent echocardiogram on 01/14/2021 demonstrating EF of 60 to 65%.  No WMA's, indeterminant diastolic parameters, RV size mildly enlarged, mild mitral regurgitation.   5.  Abnormal PFTs / History of non-small cell lung CA Recently saw pulmonology Dr. Melvyn Novas who performed PFTs on 12/31/2020. Pulmonary function diagnosis: Probable moderately severe obstructive airways disease with severe diffusion defect.  He has an upcoming follow-up with Dr. Melvyn Novas on Jan 28, 2021.  Has a follow-up CT chest without contrast scheduled on 02/11/2021 due to history of  non-small cell lung CA.  Most recent CT scan on 11/04/2020 demonstrated a positive response to therapy with some regression of the previously noted left upper lobe pulmonary nodule.  Stable 6 mm pulmonary nodule in the medial segment of the right middle lobe.  CT scan also mentioned calcification of aortic valve and recommended possible echocardiographic correlation for evaluation of potential valvular dysfunction if clinically indicated.  He follows with Dr. Delton Coombes.  His wife states he only stopped smoking around June of last year after smoking for many many years.  Medication Adjustments/Labs and Tests Ordered: Current medicines are reviewed at length with the patient today.  Concerns regarding medicines are outlined above.   Disposition: Follow-up with Dr. Domenic Polite or APP 6 months.  Signed, Levell July, NP 02/25/2021 3:53 PM    [facility] [place], [place], [place] [address] Phone:  [phone]; Fax: [phone]

## 2021-02-25 ENCOUNTER — Encounter: Payer: Self-pay | Admitting: Family Medicine

## 2021-02-25 ENCOUNTER — Ambulatory Visit (INDEPENDENT_AMBULATORY_CARE_PROVIDER_SITE_OTHER): Payer: Medicare Other | Admitting: Family Medicine

## 2021-02-25 VITALS — BP 126/72 | HR 85 | Ht 70.5 in | Wt 183.6 lb

## 2021-02-25 DIAGNOSIS — N1832 Chronic kidney disease, stage 3b: Secondary | ICD-10-CM | POA: Diagnosis not present

## 2021-02-25 DIAGNOSIS — I4821 Permanent atrial fibrillation: Secondary | ICD-10-CM

## 2021-02-25 DIAGNOSIS — I10 Essential (primary) hypertension: Secondary | ICD-10-CM

## 2021-02-25 DIAGNOSIS — R0602 Shortness of breath: Secondary | ICD-10-CM | POA: Diagnosis not present

## 2021-02-25 DIAGNOSIS — R942 Abnormal results of pulmonary function studies: Secondary | ICD-10-CM

## 2021-02-25 NOTE — Patient Instructions (Addendum)

## 2021-03-19 ENCOUNTER — Other Ambulatory Visit: Payer: Self-pay | Admitting: Family Medicine

## 2021-03-19 ENCOUNTER — Ambulatory Visit: Payer: Medicare Other | Admitting: Family Medicine

## 2021-03-19 DIAGNOSIS — L409 Psoriasis, unspecified: Secondary | ICD-10-CM

## 2021-04-02 ENCOUNTER — Ambulatory Visit (INDEPENDENT_AMBULATORY_CARE_PROVIDER_SITE_OTHER): Payer: Medicare Other | Admitting: Family Medicine

## 2021-04-02 ENCOUNTER — Other Ambulatory Visit: Payer: Self-pay

## 2021-04-02 ENCOUNTER — Encounter: Payer: Self-pay | Admitting: Family Medicine

## 2021-04-02 VITALS — BP 146/82 | HR 88 | Temp 97.9°F | Ht 70.5 in | Wt 181.4 lb

## 2021-04-02 DIAGNOSIS — D692 Other nonthrombocytopenic purpura: Secondary | ICD-10-CM | POA: Diagnosis not present

## 2021-04-02 DIAGNOSIS — N1832 Chronic kidney disease, stage 3b: Secondary | ICD-10-CM | POA: Diagnosis not present

## 2021-04-02 DIAGNOSIS — C3492 Malignant neoplasm of unspecified part of left bronchus or lung: Secondary | ICD-10-CM

## 2021-04-02 DIAGNOSIS — C61 Malignant neoplasm of prostate: Secondary | ICD-10-CM | POA: Diagnosis not present

## 2021-04-02 DIAGNOSIS — I480 Paroxysmal atrial fibrillation: Secondary | ICD-10-CM | POA: Diagnosis not present

## 2021-04-02 DIAGNOSIS — R5381 Other malaise: Secondary | ICD-10-CM | POA: Diagnosis not present

## 2021-04-02 DIAGNOSIS — J449 Chronic obstructive pulmonary disease, unspecified: Secondary | ICD-10-CM

## 2021-04-02 DIAGNOSIS — Z23 Encounter for immunization: Secondary | ICD-10-CM | POA: Diagnosis not present

## 2021-04-02 MED ORDER — BREZTRI AEROSPHERE 160-9-4.8 MCG/ACT IN AERO: 2.0000 | INHALATION_SPRAY | Freq: Two times a day (BID) | RESPIRATORY_TRACT | 0 refills | Status: DC

## 2021-04-02 NOTE — Progress Notes (Signed)
Subjective: CC: Atrial fibrillation, COPD PCP: Janora Norlander, DO ZYY:Erik Mullins is a 87 y.o. male presenting to clinic today for:  1.  Atrial fibrillation/knee pain/COPD with history of lung cancer Compliant with medications.  Continues to have low activity tolerance and his wife states that he often does not move from the couch.  She tries to encourage him to stay active but he cites that he becomes easily dyspneic and or has posterior knee pain with activity.  Compliant with Anoro which does help.  Does not wish to continue seeing pulmonology.  Has history of radiation treatment for lung cancer.  Has easy bruising but no active bleeding  2.  Prostate cancer Urology would like prostate labs ordered today.  He does not reveal any concerns from that standpoint   ROS: Per HPI  Allergies  Allergen Reactions   Other Other (See Comments)   Morphine Other (See Comments)    hallucinate   Past Medical History:  Diagnosis Date   AAA (abdominal aortic aneurysm) ([place])    Remote ~1994    Anemia    Arthritis    Atrial fibrillation [facility]    Diagnosed December 2014   AVNRT (AV nodal re-entry tachycardia) ([place])    Possible   Cataract    Bilateral   Cirrhosis ([place])    CKD (chronic kidney disease) stage 3, GFR 30-59 ml/min (HCC)    Essential hypertension    Gout    History of GI bleed    History of gout    History of hepatitis    History of radiation therapy 07/18/2020-08/06/2020   L Lung SBRT; Dr. Gery Pray   History of stroke    Hyperlipidemia    PAD (peripheral artery disease) ([place])    Stenting in lower extremities (no records)   Prostate cancer ([place])    XRT; in remission   Type 2 diabetes mellitus ([place])     Current Outpatient Medications:    acetaminophen (TYLENOL) 500 MG tablet, Take 1,000 mg by mouth every 6 (six) hours as needed for headache., Disp: , Rfl:    albuterol (VENTOLIN HFA) 108 (90 Base) MCG/ACT inhaler, Inhale 1-2 puffs into the lungs every 6 (six)  hours as needed for wheezing or shortness of breath. (Patient taking differently: Inhale 2 puffs into the lungs every 6 (six) hours as needed for wheezing or shortness of breath.), Disp: 18 g, Rfl: 2   allopurinol (ZYLOPRIM) 100 MG tablet, TAKE 1 TABLET BY MOUTH EVERY DAY, Disp: 90 tablet, Rfl: 0   ANORO ELLIPTA 62.5-25 MCG/INH AEPB, TAKE 1 PUFF BY MOUTH EVERY DAY, Disp: 60 each, Rfl: 2   betamethasone valerate ointment (VALISONE) 0.1 %, APPLY TOPICALLY 2TIMES DAILY. AVOID FACE, AXILLA AND GROIN. (USE AS NEEDED FOR PSORIASIS FLARES), Disp: 90 g, Rfl: 1   Calcium Carbonate-Vitamin D 600-400 MG-UNIT tablet, Take 1 tablet by mouth daily., Disp: , Rfl:    Carboxymethylcellulose Sodium (ARTIFICIAL TEARS OP), Place 1 drop into both eyes daily as needed (dry eyes)., Disp: , Rfl:    docusate sodium (COLACE) 250 MG capsule, Take 250 mg by mouth at bedtime., Disp: , Rfl:    furosemide (LASIX) 20 MG tablet, TAKE 1 TABLET BY MOUTH EVERY DAY, Disp: 90 tablet, Rfl: 1   memantine (NAMENDA) 5 MG tablet, Take 1 tablet (5 mg total) by mouth 2 (two) times daily. (Patient taking differently: Take 2.5 mg by mouth 2 (two) times daily.), Disp: 180 tablet, Rfl: 3   metoprolol tartrate (LOPRESSOR) 25  MG tablet, Take 1 tablet (25 mg total) by mouth 2 (two) times daily., Disp: 180 tablet, Rfl: 3   mirtazapine (REMERON) 7.5 MG tablet, TAKE 1 TABLET BY MOUTH EVERYDAY AT BEDTIME, Disp: 90 tablet, Rfl: 1   Multiple Vitamin (MULTIVITAMIN) tablet, Take 1 tablet by mouth daily., Disp: , Rfl:    XARELTO 15 MG TABS tablet, TAKE 1 TABLET (15 MG TOTAL) BY MOUTH DAILY WITH SUPPER., Disp: 90 tablet, Rfl: 0 Social History   Socioeconomic History   Marital status: Married    Spouse name: Not on file   Number of children: Not on file   Years of education: Not on file   Highest education level: Not on file  Occupational History   Not on file  Tobacco Use   Smoking status: Former    Packs/day: 1.00    Years: 70.00    Pack years:  70.00    Types: Cigarettes    Start date: 09/11/1944    Quit date: 02/17/2020    Years since quitting: 1.1   Smokeless tobacco: Former    Types: Snuff, Chew  Vaping Use   Vaping Use: Never used  Substance and Sexual Activity   Alcohol use: Not Currently    Alcohol/week: 0.0 standard drinks    Comment: 08/22/2013 "drink ~ 1 beer and wine/yr"   Drug use: No   Sexual activity: Never  Other Topics Concern   Not on file  Social History Narrative   From [place]. Relocated to this area from [facility].   Social Determinants of Health   Financial Resource Strain: Not on file  Food Insecurity: Not on file  Transportation Needs: Not on file  Physical Activity: Inactive   Days of Exercise per Week: 0 days   Minutes of Exercise per Session: 0 min  Stress: Not on file  Social Connections: Not on file  Intimate Partner Violence: Not At Risk   Fear of Current or Ex-Partner: No   Emotionally Abused: No   Physically Abused: No   Sexually Abused: No   Family History  Problem Relation Age of Onset   Cancer Mother        uterine deceased age 45   Hypertension Sister    Arrhythmia Other        Atrial fibrillation   Hypertension Maternal Grandmother     Objective: Office vital signs reviewed. BP (!) 146/82   Pulse 88   Temp 97.9 F (36.6 C)   Ht 5' 10.5" (1.791 m)   Wt 181 lb 6.4 oz (82.3 kg)   SpO2 92%   BMI 25.66 kg/m   Physical Examination:  General: Awake, alert, elderly, nontoxic, No acute distress Cardio: Regular rate and rhythm. Pulm: Decreased breath sounds appreciated in the left lung field compared to right but no wheezes, rhonchi or rales.  Normal work of breathing on room air MSK: Ambulates independently    Assessment/ Plan: 87 y.o. male   Paroxysmal atrial fibrillation ([place]) - Plan: CBC, TSH  Purpura senilis ([place])  Squamous cell lung cancer, left (HCC)  Stage 3b chronic kidney disease ([place]) - Plan: Renal Function Panel, CBC  Prostate cancer ([place]) -  Plan: Testosterone, PSA  Physical deconditioning  Chronic obstructive pulmonary disease, unspecified COPD type ([place])  Rate and rhythm control present.  Check CBC, TSH.  He has visible purpura noted and this is likely secondary to anticoagulation.  Continues to have some residual musculoskeletal pain after radiation.  We discussed that this is very typical and because  it is self limiting no additional interventions are needed  Check renal function, CBC given known stage III CKD.  Continued surveillance for prostate cancer with testosterone and PSA levels ordered by his urologist  Discussed increasing physical activity to improve bowel movements and maintain strength.  A set of home exercises were provided to the patient today.  Trial of Breztri given today to replace Anoro temporarily to see if perhaps this might be of more benefit to the patient and improve exercise tolerance.  Discussed rinsing mouth out after each use.  His wife will contact me if this is helpful and we will prescribe.  Orders Placed This Encounter  Procedures   Renal Function Panel   CBC   TSH   No orders of the defined types were placed in this encounter.    Janora Norlander, DO [place] [phone]

## 2021-04-02 NOTE — Patient Instructions (Addendum)
You had labs performed today.  You will be contacted with the results of the labs once they are available, usually in the next 3 business days for routine lab work.  If you have an active my chart account, they will be released to your MyChart.  If you prefer to have these labs released to you via telephone, please let us know.  If you had a pap smear or biopsy performed, expect to be contacted in about 7-10 days.  Trial of Breztri (2 puffs twice daily) to REPLACE Anoro for now.  If you like it, let me know and I will send in prescription  Ok to use stool softener twice daily if needed  Increase physical activity to improve endurance

## 2021-04-03 LAB — RENAL FUNCTION PANEL
Albumin: 4.3 g/dL (ref 3.6–4.6)
BUN/Creatinine Ratio: 15 (ref 10–24)
BUN: 31 mg/dL — ABNORMAL HIGH (ref 8–27)
CO2: 23 mmol/L (ref 20–29)
Calcium: 9.6 mg/dL (ref 8.6–10.2)
Chloride: 103 mmol/L (ref 96–106)
Creatinine, Ser: 2.05 mg/dL — ABNORMAL HIGH (ref 0.76–1.27)
Glucose: 109 mg/dL — ABNORMAL HIGH (ref 65–99)
Phosphorus: 3 mg/dL (ref 2.8–4.1)
Potassium: 4.3 mmol/L (ref 3.5–5.2)
Sodium: 145 mmol/L — ABNORMAL HIGH (ref 134–144)
eGFR: 31 mL/min/{1.73_m2} — ABNORMAL LOW (ref >59)

## 2021-04-03 LAB — PSA: Prostate Specific Ag, Serum: 3.2 ng/mL (ref 0.0–4.0)

## 2021-04-03 LAB — CBC
Hematocrit: 47.2 % (ref 37.5–51.0)
Hemoglobin: 15.6 g/dL (ref 13.0–17.7)
MCH: 28.8 pg (ref 26.6–33.0)
MCHC: 33.1 g/dL (ref 31.5–35.7)
MCV: 87 fL (ref 79–97)
Platelets: 175 10*3/uL (ref 150–450)
RBC: 5.41 x10E6/uL (ref 4.14–5.80)
RDW: 14.5 % (ref 11.6–15.4)
WBC: 7.4 10*3/uL (ref 3.4–10.8)

## 2021-04-03 LAB — TESTOSTERONE: Testosterone: <3 ng/dL — ABNORMAL LOW (ref 264–916)

## 2021-04-03 LAB — TSH: TSH: 4.53 u[IU]/mL — ABNORMAL HIGH (ref 0.450–4.500)

## 2021-04-03 NOTE — Progress Notes (Signed)
Add on sheet taken to lab  Pt wife aware of all so far - waiting for all labs before faxing [facility]

## 2021-04-05 LAB — SPECIMEN STATUS REPORT

## 2021-04-05 LAB — T4, FREE: Free T4: 1.06 ng/dL (ref 0.82–1.77)

## 2021-04-10 ENCOUNTER — Other Ambulatory Visit: Payer: Self-pay

## 2021-04-10 ENCOUNTER — Ambulatory Visit (INDEPENDENT_AMBULATORY_CARE_PROVIDER_SITE_OTHER): Payer: Medicare Other | Admitting: Urology

## 2021-04-10 ENCOUNTER — Encounter: Payer: Self-pay | Admitting: Urology

## 2021-04-10 VITALS — BP 141/84 | HR 80 | Temp 97.7°F | Wt 189.2 lb

## 2021-04-10 DIAGNOSIS — R351 Nocturia: Secondary | ICD-10-CM

## 2021-04-10 DIAGNOSIS — R9721 Rising PSA following treatment for malignant neoplasm of prostate: Secondary | ICD-10-CM | POA: Diagnosis not present

## 2021-04-10 DIAGNOSIS — C61 Malignant neoplasm of prostate: Secondary | ICD-10-CM

## 2021-04-10 MED ORDER — LEUPROLIDE ACETATE (6 MONTH) 45 MG IM KIT: 45.0000 mg | PACK | Freq: Once | INTRAMUSCULAR | Status: DC

## 2021-04-10 MED ORDER — LEUPROLIDE ACETATE (6 MONTH) 45 MG ~~LOC~~ KIT
45.0000 mg | PACK | Freq: Once | SUBCUTANEOUS | Status: AC
  Administered 2021-04-10: 45 mg via SUBCUTANEOUS

## 2021-04-10 NOTE — Progress Notes (Signed)
Urological Symptom Review  Patient is experiencing the following symptoms: Get up at night to urinate   Review of Systems  Gastrointestinal (upper)  : Negative for upper GI symptoms  Gastrointestinal (lower) : Constipation  Constitutional : Fatigue  Skin: Itching  Eyes: Negative for eye symptoms  Ear/Nose/Throat : Sinus problems  Hematologic/Lymphatic: Negative for Hematologic/Lymphatic symptoms  Cardiovascular : Negative for cardiovascular symptoms  Respiratory : Shortness of breath  Endocrine: Negative for endocrine symptoms  Musculoskeletal: Back pain  Neurological: Negative for neurological symptoms  Psychologic: Depression Anxiety

## 2021-04-10 NOTE — Progress Notes (Signed)
Subjective:  1. Prostate cancer ([place])   2. Rising PSA following treatment for malignant neoplasm of prostate   3. Nocturia    04/10/21: Erik Mullins returns today in f/u for his history of NM CRCP.  His last Lupron was on 10/03/20.   His PSA  is back up to 3.2 from 2.26 on 02/11/21 and 1.7 on 01/01/21.  The testosterone level remains low at <3.     He had a Chest CT on 02/11/21 that was stable.   His UA is clear today.   He is voiding well but has some frequency with a diuretic.  He has nocturia x 1.   He has had no hematuria.  He has had no weight loss.  He has no bone pain.   He has some chest discomfort that is felt to be from the lung radiation therapy.   08/23/20: Erik Mullins returns today in f/u.  He was found to have lung cancer at his last visit and has subsequently been treated with radiation therapy.    His PSA continues to rise and was 2.0 on 08/20/20 which is up from 1.5 in 7/21 and 0.8 in 12/20.  He T was castrate in 04/02/20.  He is voiding well but has stable nocturia 2-3x.  He has no bone pain or weight loss.    05/24/20: Erik Mullins returns today in f/u from an [place] PET done for his history of prostate cancer that was diagnosed in 4/10 and he completed radiation therapy on 08/16/09.  He had gleason 9 disease intiallly.  He had a PSA recurrence and was started on Lupron in 2017 after his PSA rose to 9.9 in 6/17 from 3.6 in 12/16.  A bone scan and CT in 7/17 showed no mets.   His PSA nadired at 0.2 on Lupron in 9/18 but has been rising and was 0.4 in 3/20, 0.8 in 12/20 and 1.5 on 04/02/20.  His testosterone has remained castrate.  His last Lupron injection was on 04/05/20.   He is doing well without other associated signs or symptoms.  He is voiding well with an IPSS of 3-4 with nocturia x 2-3.  He drinks a lot of coffee prior to bed.  He has no bone pain or weight loss.   He has no hematuria.     The PET scan showed a 1cm nodule in the left upper lobe that was hypermetabolic and could be prostatic or a  bronchogenic carcinoma.  Tissue sampling was recommended.  There was some diffuse uptake in the prostate as well.       ROS:  ROS  Allergies  Allergen Reactions   Other Other (See Comments)   Morphine Other (See Comments)    hallucinate    Outpatient Encounter Medications as of 04/10/2021  Medication Sig   acetaminophen (TYLENOL) 500 MG tablet Take 1,000 mg by mouth every 6 (six) hours as needed for headache.   albuterol (VENTOLIN HFA) 108 (90 Base) MCG/ACT inhaler Inhale 1-2 puffs into the lungs every 6 (six) hours as needed for wheezing or shortness of breath. (Patient taking differently: Inhale 2 puffs into the lungs every 6 (six) hours as needed for wheezing or shortness of breath.)   allopurinol (ZYLOPRIM) 100 MG tablet TAKE 1 TABLET BY MOUTH EVERY DAY   ANORO ELLIPTA 62.5-25 MCG/INH AEPB TAKE 1 PUFF BY MOUTH EVERY DAY   betamethasone valerate ointment (VALISONE) 0.1 % APPLY TOPICALLY 2TIMES DAILY. AVOID FACE, AXILLA AND GROIN. (USE AS NEEDED FOR PSORIASIS FLARES)  Budeson-Glycopyrrol-Formoterol (BREZTRI AEROSPHERE) 160-9-4.8 MCG/ACT AERO Inhale 2 puffs into the lungs 2 (two) times daily.   Calcium Carbonate-Vitamin D 600-400 MG-UNIT tablet Take 1 tablet by mouth daily.   Carboxymethylcellulose Sodium (ARTIFICIAL TEARS OP) Place 1 drop into both eyes daily as needed (dry eyes).   docusate sodium (COLACE) 250 MG capsule Take 250 mg by mouth at bedtime.   furosemide (LASIX) 20 MG tablet TAKE 1 TABLET BY MOUTH EVERY DAY   memantine (NAMENDA) 5 MG tablet Take 1 tablet (5 mg total) by mouth 2 (two) times daily. (Patient taking differently: Take 2.5 mg by mouth 2 (two) times daily.)   metoprolol tartrate (LOPRESSOR) 25 MG tablet Take 1 tablet (25 mg total) by mouth 2 (two) times daily.   mirtazapine (REMERON) 7.5 MG tablet TAKE 1 TABLET BY MOUTH EVERYDAY AT BEDTIME   Multiple Vitamin (MULTIVITAMIN) tablet Take 1 tablet by mouth daily.   XARELTO 15 MG TABS tablet TAKE 1 TABLET (15 MG  TOTAL) BY MOUTH DAILY WITH SUPPER.   [EXPIRED] leuprolide (6 Month) (ELIGARD) injection 45 mg    [DISCONTINUED] Leuprolide Acetate (6 Month) (LUPRON) injection 45 mg    No facility-administered encounter medications on file as of 04/10/2021.    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 Gery Pray   History of stroke    Hyperlipidemia    PAD (peripheral artery disease) ([place])    Stenting in lower extremities (no records)   Prostate cancer ([place])    XRT; in remission   Type 2 diabetes mellitus ([place])     Past Surgical History:  Procedure Laterality Date   ABDOMINAL AORTIC ANEURYSM REPAIR  1980's   in [place] ~ [place], BILATERAL Bilateral 2014   CHOLECYSTECTOMY     COLON SURGERY     PARTIAL COLECTOMY     "I was bleeding to death inside; took out 2/3 of my colon" (08/22/2013)    Social History   Socioeconomic History   Marital status: Married    Spouse name: Not on file   Number of children: Not on file   Years of education: Not on file   Highest education level: Not on file  Occupational History   Not on file  Tobacco Use   Smoking status: Former    Packs/day: 1.00    Years: 70.00    Pack years: 70.00    Types: Cigarettes    Start date: 09/11/1944    Quit date: 02/17/2020    Years since quitting: 1.1   Smokeless tobacco: Former    Types: Snuff, Chew  Vaping Use   Vaping Use: Never used   Substance and Sexual Activity   Alcohol use: Not Currently    Alcohol/week: 0.0 standard drinks    Comment: 08/22/2013 "drink ~  1 beer and wine/yr"   Drug use: No   Sexual activity: Never  Other Topics Concern   Not on file  Social History Narrative   From [place]. Relocated to this area from [facility].   Social Determinants of Health   Financial Resource Strain: Not on file  Food Insecurity: Not on file  Transportation Needs: Not on file  Physical Activity: Inactive   Days of Exercise per Week: 0 days   Minutes of Exercise per Session: 0 min  Stress: Not on file  Social Connections: Not on file  Intimate Partner Violence: Not At Risk   Fear of Current or Ex-Partner: No   Emotionally Abused: No   Physically Abused: No   Sexually Abused: No    Family History  Problem Relation Age of Onset   Cancer Mother        uterine deceased age 31   Hypertension Sister    Arrhythmia Other        Atrial fibrillation   Hypertension Maternal Grandmother        Objective: Vitals:   04/10/21 1356  BP: (!) 141/84  Pulse: 80  Temp: 97.7 F (36.5 C)     Physical Exam  Lab Results:  No results for input(s): HGB, HCT, PLT in the last 72 hours.  Invalid input(s):  WBC BMET No results for input(s): NA, K, CL, CO2, GLUCOSE, BUN, CREATININE, CALCIUM in the last 72 hours. PSA 1.5 04/02/20  0.8 12/20 0.4 in 3/20.    Lab Results  Component Value Date   PSA1 3.2 04/02/2021   PSA1 1.7 01/01/2021   PSA1 2.0 08/20/2020    No results found for this or any previous visit (from the past 24 hour(s)). UA is clear.  No results found for this or any previous visit (from the past 24 hour(s)).   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   Lupron given today.   He will return in 3 months with a PSA and will need to be considered for restaging.  He will has a Chest CT for Dr. Delton Coombes in 3 months and I will  see if he wants to get a PMSA PET which could assess the chest and restage the prostate cancer.   If he needs second line hormone manipulation, I will defer that  to Dr. Delton Coombes.        Orders Placed This Encounter  Procedures   Urinalysis, Routine w reflex microscopic   PSA    Standing Status:   Future    Standing Expiration Date:   10/11/2021      Return in about 3 months (around 07/11/2021).     CC: Janora Norlander, [place]    Irine Seal 04/10/2021 Patient ID: Erik Mullins, male   DOB: 01-27-1931, 86 y.o.   MRN: [id number] Patient ID: Erik Mullins, male   DOB: 1931/06/18, 87 y.o.   MRN: [id number]

## 2021-04-10 NOTE — Progress Notes (Signed)
Eligard SubQ Injection   Due to Prostate Cancer patient is present today for a Eligard Injection.  Medication: Eligard 6 month Dose: 45 mg  Location: left  Lot: [id number] Exp: 10/2022  Patient tolerated well, no complications were noted  Performed by: Jenya Putz LPN

## 2021-04-14 ENCOUNTER — Other Ambulatory Visit (HOSPITAL_COMMUNITY): Payer: Self-pay | Admitting: *Deleted

## 2021-04-14 DIAGNOSIS — Z8546 Personal history of malignant neoplasm of prostate: Secondary | ICD-10-CM

## 2021-04-14 DIAGNOSIS — C3492 Malignant neoplasm of unspecified part of left bronchus or lung: Secondary | ICD-10-CM

## 2021-04-14 NOTE — Progress Notes (Unsigned)
P 

## 2021-04-15 ENCOUNTER — Other Ambulatory Visit: Payer: Self-pay | Admitting: Family Medicine

## 2021-04-15 DIAGNOSIS — J449 Chronic obstructive pulmonary disease, unspecified: Secondary | ICD-10-CM

## 2021-05-02 ENCOUNTER — Other Ambulatory Visit: Payer: Self-pay

## 2021-05-02 ENCOUNTER — Ambulatory Visit (HOSPITAL_COMMUNITY)
Admission: RE | Admit: 2021-05-02 | Discharge: 2021-05-02 | Disposition: A | Discharge status: Home / Self Care | Payer: Medicare Other | Source: Ambulatory Visit | Attending: Hematology | Admitting: Hematology

## 2021-05-02 DIAGNOSIS — C3492 Malignant neoplasm of unspecified part of left bronchus or lung: Secondary | ICD-10-CM | POA: Insufficient documentation

## 2021-05-02 DIAGNOSIS — Z8546 Personal history of malignant neoplasm of prostate: Secondary | ICD-10-CM

## 2021-05-02 DIAGNOSIS — C61 Malignant neoplasm of prostate: Secondary | ICD-10-CM | POA: Diagnosis not present

## 2021-05-02 DIAGNOSIS — R9721 Rising PSA following treatment for malignant neoplasm of prostate: Secondary | ICD-10-CM | POA: Insufficient documentation

## 2021-05-02 MED ORDER — PIFLIFOLASTAT F 18 (PYLARIFY) INJECTION
9.0000 | Freq: Once | INTRAVENOUS | Status: AC
  Administered 2021-05-02: 8.9 via INTRAVENOUS

## 2021-05-06 NOTE — Progress Notes (Signed)
Erik Mullins, [place] [id number]   CLINIC:  Medical Oncology/Hematology  PCP:  Janora Norlander, DO [address] [place] [id number] [phone]   REASON FOR VISIT:  Follow-up for left squamous cell lung cancer  PRIOR THERAPY: SBRT to left upper lung 54 Gy in 3 fractions through 08/06/2020  NGS Results: not done  CURRENT THERAPY: surveillance  BRIEF ONCOLOGIC HISTORY:  Oncology History   No history exists.    CANCER STAGING: Cancer Staging No matching staging information was found for the patient.  INTERVAL HISTORY:  Erik Mullins, a 87 y.o. male, returns for routine follow-up of his left squamous cell lung cancer. [place] was last seen on 11/07/2020.   Today he reports feeling good, and he is accompanied by his wife. He has severe fatigue and dementia. He spends most of the day sitting down and napping. His wife reports he bruises easily and his skin breaks easily as well.   REVIEW OF SYSTEMS:  Review of Systems  Constitutional:  Positive for appetite change (50%) and fatigue (20%).  HENT:   Positive for trouble swallowing (pills).   Respiratory:  Positive for shortness of breath.   Gastrointestinal:  Positive for constipation.  Neurological:  Positive for dizziness (positional) and headaches.  Hematological:  Bruises/bleeds easily.  Psychiatric/Behavioral:  Positive for depression. The patient is nervous/anxious.   All other systems reviewed and are negative.  PAST MEDICAL/SURGICAL HISTORY:  Past Medical History:  Diagnosis Date   AAA (abdominal aortic aneurysm) ([place])    Remote ~1994    Anemia    Arthritis    Atrial fibrillation [employer])    Diagnosed December 2014   AVNRT (AV nodal re-entry tachycardia) ([place])    Possible   Cataract    Bilateral   Cirrhosis ([place])    CKD (chronic kidney disease) stage 3, GFR 30-59 ml/min (HCC)    Essential hypertension    Gout    History of GI bleed    History of gout    History  of hepatitis    History of radiation therapy 07/18/2020-08/06/2020   L Lung SBRT; Dr. Gery Pray   History of stroke    Hyperlipidemia    PAD (peripheral artery disease) ([place])    Stenting in lower extremities (no records)   Prostate cancer ([place])    XRT; in remission   Type 2 diabetes mellitus ([place])    Past Surgical History:  Procedure Laterality Date   ABDOMINAL AORTIC ANEURYSM REPAIR  1980's   in [place] ~ [place], BILATERAL Bilateral 2014   CHOLECYSTECTOMY     COLON SURGERY     PARTIAL COLECTOMY     "I was bleeding to death inside; took out 2/3 of my colon" (08/22/2013)    SOCIAL HISTORY:  Social History   Socioeconomic History   Marital status: Married    Spouse name: Not on file   Number of children: Not on file   Years of education: Not on file   Highest education level: Not on file  Occupational History   Not on file  Tobacco Use   Smoking status: Former    Packs/day: 1.00    Years: 70.00    Pack years: 70.00    Types: Cigarettes    Start date: 09/11/1944    Quit date: 02/17/2020    Years since quitting:  1.2   Smokeless tobacco: Former    Types: Snuff, Chew  Vaping Use   Vaping Use: Never used  Substance and Sexual Activity   Alcohol use: Not Currently    Alcohol/week: 0.0 standard drinks    Comment: 08/22/2013 "drink ~ 1 beer and wine/yr"   Drug use: No   Sexual activity: Never  Other Topics Concern   Not on file  Social History Narrative   From [place]. Relocated to this area from [facility].   Social Determinants of Health   Financial Resource Strain: Not on file  Food Insecurity: Not on file  Transportation Needs: Not on file  Physical Activity: Inactive   Days of Exercise per Week: 0 days   Minutes of Exercise per Session: 0 min  Stress: Not on file  Social Connections: Not on file  Intimate Partner Violence: Not At Risk   Fear  of Current or Ex-Partner: No   Emotionally Abused: No   Physically Abused: No   Sexually Abused: No    FAMILY HISTORY:  Family History  Problem Relation Age of Onset   Cancer Mother        uterine deceased age 39   Hypertension Sister    Arrhythmia Other        Atrial fibrillation   Hypertension Maternal Grandmother     CURRENT MEDICATIONS:  Current Outpatient Medications  Medication Sig Dispense Refill   acetaminophen (TYLENOL) 500 MG tablet Take 1,000 mg by mouth every 6 (six) hours as needed for headache.     albuterol (VENTOLIN HFA) 108 (90 Base) MCG/ACT inhaler Inhale 1-2 puffs into the lungs every 6 (six) hours as needed for wheezing or shortness of breath. (Patient taking differently: Inhale 2 puffs into the lungs every 6 (six) hours as needed for wheezing or shortness of breath.) 18 g 2   allopurinol (ZYLOPRIM) 100 MG tablet TAKE 1 TABLET BY MOUTH EVERY DAY 90 tablet 0   ANORO ELLIPTA 62.5-25 MCG/INH AEPB INHALE 1 PUFF BY MOUTH EVERY DAY 60 each 2   betamethasone valerate ointment (VALISONE) 0.1 % APPLY TOPICALLY 2TIMES DAILY. AVOID FACE, AXILLA AND GROIN. (USE AS NEEDED FOR PSORIASIS FLARES) 90 g 1   Budeson-Glycopyrrol-Formoterol (BREZTRI AEROSPHERE) 160-9-4.8 MCG/ACT AERO Inhale 2 puffs into the lungs 2 (two) times daily. 10.7 g 0   Calcium Carbonate-Vitamin D 600-400 MG-UNIT tablet Take 1 tablet by mouth daily.     Carboxymethylcellulose Sodium (ARTIFICIAL TEARS OP) Place 1 drop into both eyes daily as needed (dry eyes).     docusate sodium (COLACE) 250 MG capsule Take 250 mg by mouth at bedtime.     furosemide (LASIX) 20 MG tablet TAKE 1 TABLET BY MOUTH EVERY DAY 90 tablet 1   memantine (NAMENDA) 5 MG tablet Take 1 tablet (5 mg total) by mouth 2 (two) times daily. (Patient taking differently: Take 2.5 mg by mouth 2 (two) times daily.) 180 tablet 3   metoprolol tartrate (LOPRESSOR) 25 MG tablet Take 1 tablet (25 mg total) by mouth 2 (two) times daily. 180 tablet 3    mirtazapine (REMERON) 7.5 MG tablet TAKE 1 TABLET BY MOUTH EVERYDAY AT BEDTIME 90 tablet 1   Multiple Vitamin (MULTIVITAMIN) tablet Take 1 tablet by mouth daily.     XARELTO 15 MG TABS tablet TAKE 1 TABLET (15 MG TOTAL) BY MOUTH DAILY WITH SUPPER. 90 tablet 0   No current facility-administered medications for this visit.    ALLERGIES:  Allergies  Allergen Reactions   Other Other (See  Comments)   Morphine Other (See Comments)    hallucinate    PHYSICAL EXAM:  Performance status (ECOG): 1 - Symptomatic but completely ambulatory  There were no vitals filed for this visit. Wt Readings from Last 3 Encounters:  04/10/21 189 lb 3.2 oz (85.8 kg)  04/02/21 181 lb 6.4 oz (82.3 kg)  02/25/21 183 lb 9.6 oz (83.3 kg)   Physical Exam Vitals reviewed.  Constitutional:      Appearance: Normal appearance.     Comments: In wheelchair  Cardiovascular:     Rate and Rhythm: Normal rate and regular rhythm.     Pulses: Normal pulses.     Heart sounds: Normal heart sounds.  Pulmonary:     Effort: Pulmonary effort is normal.     Breath sounds: Normal breath sounds.  Neurological:     General: No focal deficit present.     Mental Status: He is alert and oriented to person, place, and time.  Psychiatric:        Mood and Affect: Mood normal.        Behavior: Behavior normal.     LABORATORY DATA:  I have reviewed the labs as listed.  CBC Latest Ref Rng & Units 04/02/2021 02/11/2021 11/04/2020  WBC 3.4 - 10.8 x10E3/uL 7.4 6.9 7.2  Hemoglobin 13.0 - 17.7 g/dL 15.6 14.7 14.2  Hematocrit 37.5 - 51.0 % 47.2 46.5 45.0  Platelets 150 - 450 x10E3/uL 175 192 164   CMP Latest Ref Rng & Units 04/02/2021 02/11/2021 11/04/2020  Glucose 65 - 99 mg/dL 109(H) 132(H) 110(H)  BUN 8 - 27 mg/dL 31(H) 30(H) 32(H)  Creatinine 0.76 - 1.27 mg/dL 2.05(H) 2.05(H) 2.00(H)  Sodium 134 - 144 mmol/L 145(H) 138 139  Potassium 3.5 - 5.2 mmol/L 4.3 3.7 4.9  Chloride 96 - 106 mmol/L 103 101 102  CO2 20 - 29 mmol/L 23 25 29    Calcium 8.6 - 10.2 mg/dL 9.6 9.1 9.7  Total Protein 6.5 - 8.1 g/dL - 7.4 7.2  Total Bilirubin 0.3 - 1.2 mg/dL - 0.5 0.6  Alkaline Phos 38 - 126 U/L - 102 87  AST 15 - 41 U/L - 21 21  ALT 0 - 44 U/L - 14 14    DIAGNOSTIC IMAGING:  I have independently reviewed the scans and discussed with the patient. NM PET (PSMA) SKULL TO MID THIGH  Result Date: 05/02/2021 CLINICAL DATA:  Prostate carcinoma with biochemical recurrence. EXAM: NUCLEAR MEDICINE PET SKULL BASE TO THIGH TECHNIQUE: 8.9 mCi F18 Piflufolastat (Pylarify) was injected intravenously. Full-ring PET imaging was performed from the skull base to thigh after the radiotracer. CT data was obtained and used for attenuation correction and anatomic localization. COMPARISON:  FDG PET scan 05/08/2020., Axumin PET scan 05/14/2020 FINDINGS: NECK No radiotracer activity in neck lymph nodes. Incidental CT finding: None CHEST Mild nonspecific radiotracer activity within treated site within the LEFT lung with SUV max equal 2.7. No evidence of prostate cancer metastasis in the thorax. Incidental CT finding: None ABDOMEN/PELVIS Prostate: There is focal activity within the most inferior RIGHT aspect of the prostate gland with SUV max equal 15.6. (Image 212) Lymph nodes: No abnormal radiotracer accumulation within pelvic or abdominal nodes. Liver: No evidence of liver metastasis Incidental CT finding: Marked cystic change within the LEFT kidney with scant remaining normal renal parenchyma. Post aortic graft repair. SKELETON No focal  activity to suggest skeletal metastasis. IMPRESSION: 1. Focal activity in the most inferior RIGHT aspect of the prostate gland concerning for local prostate  cancer recurrence. 2. No evidence of metastatic prostate cancer adenopathy in the pelvis or periaortic retroperitoneum. 3. No evidence of visceral metastasis or skeletal metastasis. Electronically Signed   By: Suzy Bouchard M.D.   On: 05/02/2021 17:04     ASSESSMENT:  1.  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 -Incidental finding of left upper lobe lung nodule on [facility] PET scan done for prostate cancer. -CT biopsy on 06/18/2019 one of the left upper lobe lung nodule consistent with squamous cell carcinoma, IHC p40 positive. -PET CT scan on 07/08/2020 shows 30 mm left upper lobe nodule, SUV 10.  No hypermetabolic adenopathy.  Hypermetabolic focally in the right prostate is nonspecific.   2.  Prostate cancer: -Prostate cancer, Gleason 9 diagnosed in April 2010 -Status post seed placement and XRT completed on 08/16/2009 in [place]. -PSA recurrence and was started on Lupron in 2017 after PSA rose to 9.9 in 6/17 from 3.6 in 12/16. -PSA nadired at 0.2 on Lupron in 9/18 but has been rising, 0.4 in 3/20, 0.8 in 12/20 and 1.5 on 04/02/2020.  Testosterone levels remained castrate. -[facility] PET scan on 05/14/2020 shows hypermetabolic left upper lobe lung nodule.  Radiation seeds in the prostate with diffuse hypermetabolic him, indeterminate.  Smaller right upper lobe lung nodule is below PET resolution.  No skeletal metastasis.   PLAN:  1.  Left upper lobe squamous cell lung carcinoma: - SBRT of the left lung lesion completed on 08/06/2020. - CT scan in May showed posttreatment changes. - I have reviewed CT chest images from the PET scan from 05/02/2021 which showed decrease in size of the left lung lesion consistent with postradiation treatment response. - Recommend follow-up in 6 months with repeat CT of the chest without contrast. - If his functional status declines, patient's wife will call and cancel appointment.   2.  M0 castrate resistant prostate cancer: -He is currently on Lupron injections, administered by Dr. Roni Bread. - Most recent PSA was 3.2 in July. - I have reviewed results of PSMA PET scan from 05/02/2021 which showed focal activity in the most inferior right aspect of the prostate gland concerning for local prostate  cancer recurrence.  No evidence of metastatic disease. - His wife reports that his dementia is getting worse.  He also has poor functional status and not a candidate for second-generation antiandrogen therapy.   3.  CKD: -Latest creatinine is 2.0. Has been stable.   Orders placed this encounter:  No orders of the defined types were placed in this encounter.    Derek Jack, MD [place] [phone]   I, Thana Ates, am acting as a scribe for Dr. Derek Jack.  I, Derek Jack MD, have reviewed the above documentation for accuracy and completeness, and I agree with the above.

## 2021-05-07 ENCOUNTER — Other Ambulatory Visit: Payer: Self-pay

## 2021-05-07 ENCOUNTER — Inpatient Hospital Stay (HOSPITAL_COMMUNITY): Payer: Medicare Other | Attending: Hematology and Oncology | Admitting: Hematology

## 2021-05-07 VITALS — BP 114/77 | HR 85 | Temp 96.8°F | Resp 18 | Wt 178.6 lb

## 2021-05-07 DIAGNOSIS — Z87891 Personal history of nicotine dependence: Secondary | ICD-10-CM | POA: Insufficient documentation

## 2021-05-07 DIAGNOSIS — Z993 Dependence on wheelchair: Secondary | ICD-10-CM | POA: Insufficient documentation

## 2021-05-07 DIAGNOSIS — C61 Malignant neoplasm of prostate: Secondary | ICD-10-CM | POA: Insufficient documentation

## 2021-05-07 DIAGNOSIS — N189 Chronic kidney disease, unspecified: Secondary | ICD-10-CM | POA: Insufficient documentation

## 2021-05-07 DIAGNOSIS — C3492 Malignant neoplasm of unspecified part of left bronchus or lung: Secondary | ICD-10-CM

## 2021-05-07 DIAGNOSIS — C3412 Malignant neoplasm of upper lobe, left bronchus or lung: Secondary | ICD-10-CM | POA: Insufficient documentation

## 2021-05-07 DIAGNOSIS — F0390 Unspecified dementia without behavioral disturbance: Secondary | ICD-10-CM | POA: Diagnosis not present

## 2021-05-07 NOTE — Patient Instructions (Addendum)
[facility] Discharge Instructions  You were seen today by Dr. Katragadda. He went over your recent results and scans. Dr. Katragadda will see you back in 6 months for labs and follow up.   Thank you for choosing [facility] to provide your oncology and hematology care.  To afford each patient quality time with our provider, please arrive at least 15 minutes before your scheduled appointment time.   If you have a lab appointment with the Cancer Center please come in thru the Main Entrance and check in at the main information desk  You need to re-schedule your appointment should you arrive 10 or more minutes late.  We strive to give you quality time with our providers, and arriving late affects you and other patients whose appointments are after yours.  Also, if you no show three or more times for appointments you may be dismissed from the clinic at the providers discretion.     Again, thank you for choosing [facility].  Our hope is that these requests will decrease the amount of time that you wait before being seen by our physicians.       _____________________________________________________________  Should you have questions after your visit to [facility], please contact our office at [phone] between the hours of 8:00 a.m. and 4:30 p.m.  Voicemails left after 4:00 p.m. will not be returned until the following business day.  For prescription refill requests, have your pharmacy contact our office and allow 72 hours.    Cancer Center Support Programs:   > Cancer Support Group  2nd Tuesday of the month 1pm-2pm, Journey Room   

## 2021-05-14 ENCOUNTER — Ambulatory Visit (INDEPENDENT_AMBULATORY_CARE_PROVIDER_SITE_OTHER): Payer: Medicare Other | Admitting: Family Medicine

## 2021-05-14 ENCOUNTER — Other Ambulatory Visit: Payer: Self-pay

## 2021-05-14 ENCOUNTER — Encounter (HOSPITAL_COMMUNITY): Payer: Self-pay

## 2021-05-14 ENCOUNTER — Encounter: Payer: Self-pay | Admitting: Family Medicine

## 2021-05-14 ENCOUNTER — Emergency Department (HOSPITAL_COMMUNITY)
Admission: EM | Admit: 2021-05-14 | Discharge: 2021-05-14 | Disposition: A | Discharge status: Home / Self Care | Payer: Medicare Other | Attending: Emergency Medicine | Admitting: Emergency Medicine

## 2021-05-14 DIAGNOSIS — R197 Diarrhea, unspecified: Secondary | ICD-10-CM | POA: Insufficient documentation

## 2021-05-14 DIAGNOSIS — R34 Anuria and oliguria: Secondary | ICD-10-CM

## 2021-05-14 DIAGNOSIS — Z5321 Procedure and treatment not carried out due to patient leaving prior to being seen by health care provider: Secondary | ICD-10-CM | POA: Diagnosis not present

## 2021-05-14 LAB — COMPREHENSIVE METABOLIC PANEL
ALT: 12 U/L (ref 0–44)
AST: 22 U/L (ref 15–41)
Albumin: 4.1 g/dL (ref 3.5–5.0)
Alkaline Phosphatase: 101 U/L (ref 38–126)
Anion gap: 10 (ref 5–15)
BUN: 30 mg/dL — ABNORMAL HIGH (ref 8–23)
CO2: 26 mmol/L (ref 22–32)
Calcium: 9.2 mg/dL (ref 8.9–10.3)
Chloride: 103 mmol/L (ref 98–111)
Creatinine, Ser: 2.24 mg/dL — ABNORMAL HIGH (ref 0.61–1.24)
GFR, Estimated: 28 mL/min — ABNORMAL LOW (ref >60)
Glucose, Bld: 106 mg/dL — ABNORMAL HIGH (ref 70–99)
Potassium: 4 mmol/L (ref 3.5–5.1)
Sodium: 139 mmol/L (ref 135–145)
Total Bilirubin: 0.4 mg/dL (ref 0.3–1.2)
Total Protein: 7.5 g/dL (ref 6.5–8.1)

## 2021-05-14 LAB — CBC
HCT: 48.4 % (ref 39.0–52.0)
Hemoglobin: 15 g/dL (ref 13.0–17.0)
MCH: 29.2 pg (ref 26.0–34.0)
MCHC: 31 g/dL (ref 30.0–36.0)
MCV: 94.3 fL (ref 80.0–100.0)
Platelets: 165 10*3/uL (ref 150–400)
RBC: 5.13 MIL/uL (ref 4.22–5.81)
RDW: 14.5 % (ref 11.5–15.5)
WBC: 7.8 10*3/uL (ref 4.0–10.5)
nRBC: 0 % (ref 0.0–0.2)

## 2021-05-14 LAB — LIPASE, BLOOD: Lipase: 24 U/L (ref 11–51)

## 2021-05-14 NOTE — Progress Notes (Signed)
Virtual Visit via Telephone Note  I connected with Erik Mullins on 05/14/21 at 2:41 PM by telephone and verified that I am speaking with the correct person using two identifiers. Erik Mullins is currently located at home and his wife is currently with him during this visit. The provider, Loman Brooklyn, FNP is located in their office at time of visit.  I discussed the limitations, risks, security and privacy concerns of performing an evaluation and management service by telephone and the availability of in person appointments. I also discussed with the patient that there may be a patient responsible charge related to this service. The patient expressed understanding and agreed to proceed.  Subjective: PCP: Janora Norlander, DO  Chief Complaint  Patient presents with   GI Problem   Patient started having watery diarrhea two days ago. He has taken Imodium, which has not stopped the diarrhea. He is getting very weak, is not eating, and is drinking little fluids. His wife is trying her best to push fluids, reporting 4 glasses today. He is not urinating per his usual. Has not urinated in the past 12 hours.    ROS: Per HPI  Current Outpatient Medications:    acetaminophen (TYLENOL) 500 MG tablet, Take 1,000 mg by mouth every 6 (six) hours as needed for headache., Disp: , Rfl:    albuterol (VENTOLIN HFA) 108 (90 Base) MCG/ACT inhaler, Inhale 1-2 puffs into the lungs every 6 (six) hours as needed for wheezing or shortness of breath. (Patient taking differently: Inhale 2 puffs into the lungs every 6 (six) hours as needed for wheezing or shortness of breath.), Disp: 18 g, Rfl: 2   allopurinol (ZYLOPRIM) 100 MG tablet, TAKE 1 TABLET BY MOUTH EVERY DAY, Disp: 90 tablet, Rfl: 0   ANORO ELLIPTA 62.5-25 MCG/INH AEPB, INHALE 1 PUFF BY MOUTH EVERY DAY, Disp: 60 each, Rfl: 2   betamethasone valerate ointment (VALISONE) 0.1 %, APPLY TOPICALLY 2TIMES DAILY. AVOID FACE, AXILLA AND GROIN. (USE AS NEEDED FOR  PSORIASIS FLARES), Disp: 90 g, Rfl: 1   Calcium Carbonate-Vitamin D 600-400 MG-UNIT tablet, Take 1 tablet by mouth daily., Disp: , Rfl:    Carboxymethylcellulose Sodium (ARTIFICIAL TEARS OP), Place 1 drop into both eyes daily as needed (dry eyes)., Disp: , Rfl:    docusate sodium (COLACE) 250 MG capsule, Take 250 mg by mouth at bedtime., Disp: , Rfl:    furosemide (LASIX) 20 MG tablet, TAKE 1 TABLET BY MOUTH EVERY DAY, Disp: 90 tablet, Rfl: 1   memantine (NAMENDA) 5 MG tablet, Take 1 tablet (5 mg total) by mouth 2 (two) times daily. (Patient taking differently: Take 2.5 mg by mouth 2 (two) times daily.), Disp: 180 tablet, Rfl: 3   metoprolol tartrate (LOPRESSOR) 25 MG tablet, Take 1 tablet (25 mg total) by mouth 2 (two) times daily., Disp: 180 tablet, Rfl: 3   mirtazapine (REMERON) 7.5 MG tablet, TAKE 1 TABLET BY MOUTH EVERYDAY AT BEDTIME, Disp: 90 tablet, Rfl: 1   Multiple Vitamin (MULTIVITAMIN) tablet, Take 1 tablet by mouth daily., Disp: , Rfl:    XARELTO 15 MG TABS tablet, TAKE 1 TABLET (15 MG TOTAL) BY MOUTH DAILY WITH SUPPER., Disp: 90 tablet, Rfl: 0  Allergies  Allergen Reactions   Other Other (See Comments)   Morphine Other (See Comments)    hallucinate   Past Medical History:  Diagnosis Date   AAA (abdominal aortic aneurysm) (HCC)    Remote ~1994    Anemia    Arthritis  Atrial fibrillation [facility])    Diagnosed December 2014   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 Gery Pray   History of stroke    Hyperlipidemia    PAD (peripheral artery disease) ([place])    Stenting in lower extremities (no records)   Prostate cancer ([place])    XRT; in remission   Type 2 diabetes mellitus  ([place])     Observations/Objective: A&O  No respiratory distress or wheezing audible over the phone Mood, judgement, and thought processes all WNL   Assessment and Plan: 1-2. Diarrhea, unspecified type/Decreased urination Advised to go to the ER for IV fluids since he is not urinating per his usual and has not urinated in the past 12 hours. Concern for dehydration. He is agreeable and will be going to [facility].    Follow Up Instructions:  I discussed the assessment and treatment plan with the patient. The patient was provided an opportunity to ask questions and all were answered. The patient agreed with the plan and demonstrated an understanding of the instructions.   The patient was advised to call back or seek an in-person evaluation if the symptoms worsen or if the condition fails to improve as anticipated.  The above assessment and management plan was discussed with the patient. The patient verbalized understanding of and has agreed to the management plan. Patient is aware to call the clinic if symptoms persist or worsen. Patient is aware when to return to the clinic for a follow-up visit. Patient educated on when it is appropriate to go to the emergency department.   Time call ended: 2:52 PM  I provided 11 minutes of non-face-to-face time during this encounter.  Hendricks Limes, MSN, APRN, FNP-C [facility] 05/14/21

## 2021-05-14 NOTE — ED Triage Notes (Signed)
Pt presents to ED with complaints of diarrhea since Sunday night. Pt denies abdominal pain.

## 2021-05-15 ENCOUNTER — Encounter (HOSPITAL_COMMUNITY): Payer: Self-pay

## 2021-05-15 ENCOUNTER — Other Ambulatory Visit: Payer: Self-pay

## 2021-05-15 ENCOUNTER — Emergency Department (HOSPITAL_COMMUNITY)
Admission: EM | Admit: 2021-05-15 | Discharge: 2021-05-15 | Disposition: A | Discharge status: Home / Self Care | Payer: Medicare Other | Attending: Emergency Medicine | Admitting: Emergency Medicine

## 2021-05-15 DIAGNOSIS — Z87891 Personal history of nicotine dependence: Secondary | ICD-10-CM | POA: Insufficient documentation

## 2021-05-15 DIAGNOSIS — I129 Hypertensive chronic kidney disease with stage 1 through stage 4 chronic kidney disease, or unspecified chronic kidney disease: Secondary | ICD-10-CM | POA: Insufficient documentation

## 2021-05-15 DIAGNOSIS — R197 Diarrhea, unspecified: Secondary | ICD-10-CM | POA: Diagnosis not present

## 2021-05-15 DIAGNOSIS — N289 Disorder of kidney and ureter, unspecified: Secondary | ICD-10-CM | POA: Diagnosis not present

## 2021-05-15 DIAGNOSIS — Z20822 Contact with and (suspected) exposure to covid-19: Secondary | ICD-10-CM | POA: Insufficient documentation

## 2021-05-15 DIAGNOSIS — Z7951 Long term (current) use of inhaled steroids: Secondary | ICD-10-CM | POA: Insufficient documentation

## 2021-05-15 DIAGNOSIS — E1122 Type 2 diabetes mellitus with diabetic chronic kidney disease: Secondary | ICD-10-CM | POA: Diagnosis not present

## 2021-05-15 DIAGNOSIS — J449 Chronic obstructive pulmonary disease, unspecified: Secondary | ICD-10-CM | POA: Diagnosis not present

## 2021-05-15 DIAGNOSIS — N183 Chronic kidney disease, stage 3 unspecified: Secondary | ICD-10-CM | POA: Diagnosis not present

## 2021-05-15 DIAGNOSIS — F0150 Vascular dementia without behavioral disturbance: Secondary | ICD-10-CM | POA: Insufficient documentation

## 2021-05-15 DIAGNOSIS — Z79899 Other long term (current) drug therapy: Secondary | ICD-10-CM | POA: Diagnosis not present

## 2021-05-15 DIAGNOSIS — I4891 Unspecified atrial fibrillation: Secondary | ICD-10-CM | POA: Diagnosis not present

## 2021-05-15 DIAGNOSIS — Z7901 Long term (current) use of anticoagulants: Secondary | ICD-10-CM | POA: Insufficient documentation

## 2021-05-15 DIAGNOSIS — Z8546 Personal history of malignant neoplasm of prostate: Secondary | ICD-10-CM | POA: Diagnosis not present

## 2021-05-15 LAB — BASIC METABOLIC PANEL
Anion gap: 10 (ref 5–15)
BUN: 35 mg/dL — ABNORMAL HIGH (ref 8–23)
CO2: 25 mmol/L (ref 22–32)
Calcium: 9.1 mg/dL (ref 8.9–10.3)
Chloride: 99 mmol/L (ref 98–111)
Creatinine, Ser: 2.66 mg/dL — ABNORMAL HIGH (ref 0.61–1.24)
GFR, Estimated: 23 mL/min — ABNORMAL LOW (ref >60)
Glucose, Bld: 100 mg/dL — ABNORMAL HIGH (ref 70–99)
Potassium: 3.7 mmol/L (ref 3.5–5.1)
Sodium: 134 mmol/L — ABNORMAL LOW (ref 135–145)

## 2021-05-15 LAB — MAGNESIUM: Magnesium: 2 mg/dL (ref 1.7–2.4)

## 2021-05-15 LAB — SARS CORONAVIRUS 2 (TAT 6-24 HRS): SARS Coronavirus 2: NEGATIVE

## 2021-05-15 MED ORDER — LACTATED RINGERS IV BOLUS: 1000.0000 mL | Freq: Once | INTRAVENOUS | Status: DC

## 2021-05-15 MED ORDER — SODIUM CHLORIDE 0.9 % IV BOLUS
1000.0000 mL | Freq: Once | INTRAVENOUS | Status: AC
  Administered 2021-05-15: 1000 mL via INTRAVENOUS

## 2021-05-15 MED ORDER — LOPERAMIDE HCL 2 MG PO CAPS: 2.0000 mg | ORAL_CAPSULE | Freq: Four times a day (QID) | ORAL | 0 refills | Status: DC | PRN

## 2021-05-15 NOTE — ED Triage Notes (Signed)
Pt presents to ED with complaints of diarrhea since Sunday, denies abdominal pain
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
## 2021-05-15 NOTE — Discharge Instructions (Addendum)
I think your symptoms are likely due to a gastrointestinal virus. Your lab work is reassuring that you do not need to be kept in the hospital. Your COVID test should result in the next few days. If it is positive, follow CDC precautions for isolation.  It is important to drink lots of water. I am writing you a prescription for an anti-diarrheal medication called Loperamide, which you can take 1 capsule, and then an additional capsule after each loose stool. Do not exceed more than 4 capsules in 24 hours.  You need to follow up with your primary doctor for repeat lab work in 5-7 days to evaluate your kidney numbers.  Return to the ED for new or worsening symptoms such as diarrhea, abdominal pain, weakness, or fevers.

## 2021-05-16 ENCOUNTER — Ambulatory Visit: Payer: Medicare Other | Admitting: Family Medicine

## 2021-05-20 ENCOUNTER — Other Ambulatory Visit (HOSPITAL_COMMUNITY): Payer: Medicare Other

## 2021-05-27 ENCOUNTER — Ambulatory Visit (HOSPITAL_COMMUNITY): Payer: Medicare Other | Admitting: Hematology

## 2021-05-27 ENCOUNTER — Ambulatory Visit (INDEPENDENT_AMBULATORY_CARE_PROVIDER_SITE_OTHER): Payer: Medicare Other | Admitting: Family Medicine

## 2021-05-27 DIAGNOSIS — R197 Diarrhea, unspecified: Secondary | ICD-10-CM | POA: Diagnosis not present

## 2021-05-27 DIAGNOSIS — N289 Disorder of kidney and ureter, unspecified: Secondary | ICD-10-CM

## 2021-05-27 DIAGNOSIS — N189 Chronic kidney disease, unspecified: Secondary | ICD-10-CM
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
## 2021-05-28 ENCOUNTER — Other Ambulatory Visit: Payer: Self-pay

## 2021-05-28 ENCOUNTER — Other Ambulatory Visit: Payer: Medicare Other

## 2021-05-28 DIAGNOSIS — R197 Diarrhea, unspecified: Secondary | ICD-10-CM

## 2021-05-29 ENCOUNTER — Other Ambulatory Visit: Payer: Medicare Other

## 2021-05-29 ENCOUNTER — Other Ambulatory Visit: Payer: Self-pay | Admitting: Family Medicine

## 2021-05-29 DIAGNOSIS — R197 Diarrhea, unspecified: Secondary | ICD-10-CM | POA: Diagnosis not present

## 2021-05-30 LAB — CLOSTRIDIUM DIFFICILE BY PCR: Toxigenic C. Difficile by PCR: NEGATIVE

## 2021-06-01 LAB — STOOL CULTURE: E coli, Shiga toxin Assay: NEGATIVE

## 2021-06-04 ENCOUNTER — Telehealth: Payer: Self-pay | Admitting: Family Medicine

## 2021-06-04 NOTE — Telephone Encounter (Signed)
Left message for patient to call back and schedule Medicare Annual Wellness Visit (AWV)   Last AWV 04/06/14  please schedule at anytime with health coach

## 2021-06-09 LAB — OVA AND PARASITE EXAMINATION

## 2021-07-04 ENCOUNTER — Ambulatory Visit (INDEPENDENT_AMBULATORY_CARE_PROVIDER_SITE_OTHER): Payer: Medicare Other | Admitting: Family Medicine

## 2021-07-04 ENCOUNTER — Encounter: Payer: Self-pay | Admitting: Family Medicine

## 2021-07-04 ENCOUNTER — Other Ambulatory Visit: Payer: Self-pay

## 2021-07-04 VITALS — BP 127/82 | HR 79 | Temp 97.0°F | Ht 70.0 in | Wt 175.8 lb

## 2021-07-04 DIAGNOSIS — N1832 Chronic kidney disease, stage 3b: Secondary | ICD-10-CM | POA: Diagnosis not present

## 2021-07-04 DIAGNOSIS — I480 Paroxysmal atrial fibrillation: Secondary | ICD-10-CM

## 2021-07-04 DIAGNOSIS — C61 Malignant neoplasm of prostate: Secondary | ICD-10-CM | POA: Diagnosis not present

## 2021-07-04 DIAGNOSIS — E7800 Pure hypercholesterolemia, unspecified: Secondary | ICD-10-CM | POA: Diagnosis not present

## 2021-07-04 DIAGNOSIS — R9721 Rising PSA following treatment for malignant neoplasm of prostate: Secondary | ICD-10-CM

## 2021-07-04 DIAGNOSIS — R197 Diarrhea, unspecified: Secondary | ICD-10-CM | POA: Diagnosis not present

## 2021-07-04 DIAGNOSIS — N189 Chronic kidney disease, unspecified: Secondary | ICD-10-CM | POA: Diagnosis not present

## 2021-07-04 DIAGNOSIS — Z23 Encounter for immunization: Secondary | ICD-10-CM | POA: Diagnosis not present

## 2021-07-04 DIAGNOSIS — J410 Simple chronic bronchitis: Secondary | ICD-10-CM | POA: Diagnosis not present

## 2021-07-04 DIAGNOSIS — R7989 Other specified abnormal findings of blood chemistry: Secondary | ICD-10-CM

## 2021-07-04 DIAGNOSIS — N289 Disorder of kidney and ureter, unspecified: Secondary | ICD-10-CM | POA: Diagnosis not present

## 2021-07-04 NOTE — Patient Instructions (Signed)

## 2021-07-04 NOTE — Progress Notes (Signed)
Subjective: CC: Recheck memory, TSH PCP: Janora Norlander, DO BWI:OMBT T Sposito is a 87 y.o. male presenting to clinic today for:  1.  Atrial fibrillation Patient compliant with medications.  Has been recently transition from Xarelto to Eliquis but patient's wife not sure what dose of Eliquis he is on.  He has not started the medicine yet as he has almost a month of Xarelto left over.  No bleeding in the rectum.  No urinary bleeding.  She notes that he was cleaning out his ears today he did have scant bleeding from the right ear and she would like this to be checked out today  2.  Elevated TSH Elevated TSH noted on the 2019 and 2022 TSH labs.  Free T4 was normal last check in July.  No reports of heart palpitation, tremor or change in bowel habits  3.  CKD 3B/history of prostate cancer Continues to have frequent urinary output and in fact will be seeing Dr. Jeffie Pollock next week.  Needs to get PSA drawn.  Continued on Lasix.  4.  Chronic bronchitis Compliant with Anoro.  Unfortunately is now in the donut hole.   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 Gery Pray   History of stroke    Hyperlipidemia    PAD (peripheral artery disease) ([place])    Stenting in lower extremities (no records)   Prostate cancer ([place])    XRT; in remission   Type 2 diabetes mellitus ([place])     Current Outpatient  Medications:    acetaminophen (TYLENOL) 500 MG tablet, Take 1,000 mg by mouth every 6 (six) hours as needed for headache., Disp: , Rfl:    albuterol (VENTOLIN HFA) 108 (90 Base) MCG/ACT inhaler, Inhale 1-2 puffs into the lungs every 6 (six) hours as needed for wheezing or shortness of breath. (Patient taking differently: Inhale 2 puffs into the lungs every 6 (six) hours as needed for wheezing or shortness of breath.), Disp: 18 g, Rfl: 2   allopurinol (ZYLOPRIM) 100 MG tablet, TAKE 1 TABLET BY MOUTH EVERY DAY, Disp: 90 tablet, Rfl: 0   ANORO ELLIPTA 62.5-25 MCG/INH AEPB, INHALE 1 PUFF BY MOUTH EVERY DAY, Disp: 60 each, Rfl: 2   betamethasone valerate ointment (VALISONE) 0.1 %, APPLY TOPICALLY 2TIMES DAILY. AVOID FACE, AXILLA AND GROIN. (USE AS NEEDED FOR PSORIASIS FLARES), Disp: 90 g, Rfl: 1   Calcium Carbonate-Vitamin D 600-400 MG-UNIT tablet, Take 1 tablet by mouth daily., Disp: , Rfl:    Carboxymethylcellulose Sodium (ARTIFICIAL TEARS OP), Place 1 drop into both eyes daily as needed (dry eyes)., Disp: , Rfl:    docusate sodium (COLACE) 250 MG capsule, Take 250 mg by mouth at bedtime., Disp: , Rfl:  furosemide (LASIX) 20 MG tablet, TAKE 1 TABLET BY MOUTH EVERY DAY, Disp: 90 tablet, Rfl: 1   loperamide (IMODIUM) 2 MG capsule, Take 1 capsule (2 mg total) by mouth 4 (four) times daily as needed for diarrhea or loose stools., Disp: 12 capsule, Rfl: 0   memantine (NAMENDA) 5 MG tablet, Take 1 tablet (5 mg total) by mouth 2 (two) times daily. (Patient taking differently: Take 2.5 mg by mouth 2 (two) times daily.), Disp: 180 tablet, Rfl: 3   metoprolol tartrate (LOPRESSOR) 25 MG tablet, Take 1 tablet (25 mg total) by mouth 2 (two) times daily., Disp: 180 tablet, Rfl: 3   mirtazapine (REMERON) 7.5 MG tablet, TAKE 1 TABLET BY MOUTH EVERYDAY AT BEDTIME, Disp: 90 tablet, Rfl: 1   Multiple Vitamin (MULTIVITAMIN) tablet, Take 1 tablet by mouth daily., Disp: , Rfl:    XARELTO 15 MG TABS tablet, TAKE 1 TABLET (15  MG TOTAL) BY MOUTH DAILY WITH SUPPER., Disp: 90 tablet, Rfl: 0 Social History   Socioeconomic History   Marital status: Married    Spouse name: Not on file   Number of children: Not on file   Years of education: Not on file   Highest education level: Not on file  Occupational History   Not on file  Tobacco Use   Smoking status: Former    Packs/day: 1.00    Years: 70.00    Pack years: 70.00    Types: Cigarettes    Start date: 09/11/1944    Quit date: 02/17/2020    Years since quitting: 1.3   Smokeless tobacco: Former    Types: Snuff, Chew  Vaping Use   Vaping Use: Never used  Substance and Sexual Activity   Alcohol use: Not Currently    Alcohol/week: 0.0 standard drinks    Comment: 08/22/2013 "drink ~ 1 beer and wine/yr"   Drug use: No   Sexual activity: Never  Other Topics Concern   Not on file  Social History Narrative   From [place]. Relocated to this area from [facility].   Social Determinants of Health   Financial Resource Strain: Not on file  Food Insecurity: Not on file  Transportation Needs: Not on file  Physical Activity: Inactive   Days of Exercise per Week: 0 days   Minutes of Exercise per Session: 0 min  Stress: Not on file  Social Connections: Not on file  Intimate Partner Violence: Not At Risk   Fear of Current or Ex-Partner: No   Emotionally Abused: No   Physically Abused: No   Sexually Abused: No   Family History  Problem Relation Age of Onset   Cancer Mother        uterine deceased age 3   Hypertension Sister    Arrhythmia Other        Atrial fibrillation   Hypertension Maternal Grandmother     Objective: Office vital signs reviewed. BP 127/82   Pulse 79   Temp (!) 97 F (36.1 C)   Ht 5\' 10"  (1.778 m)   Wt 175 lb 12.8 oz (79.7 kg)   SpO2 96%   BMI 25.22 kg/m   Physical Examination:  General: Awake, alert, nontoxic elderly male.  Acute distress HEENT: Right external auditory canal with scant bleeding.  No evidence of  tympanic membrane perforation, injury or erythema. Cardio: regular rate and rhythm, S1S2 heard, no murmurs appreciated Pulm: clear to auscultation bilaterally, no wheezes, rhonchi or rales; normal work of breathing on room air MSK: Ambulating independently Neuro:  Hard of hearing  Assessment/ Plan: 87 y.o. male   Paroxysmal atrial fibrillation (HCC)  Stage 3b chronic kidney disease ([place]) - Plan: CBC, Renal Function Panel  Simple chronic bronchitis (HCC) - Plan: AMB Referral to [place]  Rising PSA following treatment for malignant neoplasm of prostate - Plan: PSA  Prostate cancer ([place]) - Plan: PSA  Elevated TSH - Plan: TSH, T4, Free  Pure hypercholesterolemia - Plan: TSH, T4, Free  Need for immunization against influenza - Plan: Flu Vaccine QUAD High Dose(Fluad)  Seems to be rate and rhythm controlled.  Transitioning from Xarelto to Eliquis given impaired renal function.  I certainly think this is appropriate  Check renal function  Continue Anoro.  We will provide samples.  Referral to CCM for patient assistance  PSA to be collected for Dr. Jeffie Pollock.  Has appointment next week  TSH noted to be mildly elevated on the last couple of checks.  Check TSH, free T4  Not yet due for fasting lipid panel  Influenza vaccination administered   No orders of the defined types were placed in this encounter.  No orders of the defined types were placed in this encounter.    Janora Norlander, DO [place] [phone]

## 2021-07-05 LAB — RENAL FUNCTION PANEL
Albumin: 4.5 g/dL (ref 3.6–4.6)
BUN/Creatinine Ratio: 17 (ref 10–24)
BUN: 31 mg/dL — ABNORMAL HIGH (ref 8–27)
CO2: 28 mmol/L (ref 20–29)
Calcium: 10.2 mg/dL (ref 8.6–10.2)
Chloride: 100 mmol/L (ref 96–106)
Creatinine, Ser: 1.86 mg/dL — ABNORMAL HIGH (ref 0.76–1.27)
Glucose: 83 mg/dL (ref 70–99)
Phosphorus: 3.9 mg/dL (ref 2.8–4.1)
Potassium: 5.4 mmol/L — ABNORMAL HIGH (ref 3.5–5.2)
Sodium: 139 mmol/L (ref 134–144)
eGFR: 35 mL/min/{1.73_m2} — ABNORMAL LOW (ref >59)

## 2021-07-05 LAB — CBC
Hematocrit: 46.2 % (ref 37.5–51.0)
Hemoglobin: 15.3 g/dL (ref 13.0–17.7)
MCH: 29.5 pg (ref 26.6–33.0)
MCHC: 33.1 g/dL (ref 31.5–35.7)
MCV: 89 fL (ref 79–97)
Platelets: 193 10*3/uL (ref 150–450)
RBC: 5.18 x10E6/uL (ref 4.14–5.80)
RDW: 14.5 % (ref 11.6–15.4)
WBC: 7.1 10*3/uL (ref 3.4–10.8)

## 2021-07-05 LAB — TSH: TSH: 9.07 u[IU]/mL — ABNORMAL HIGH (ref 0.450–4.500)

## 2021-07-05 LAB — PSA: Prostate Specific Ag, Serum: 4.4 ng/mL — ABNORMAL HIGH (ref 0.0–4.0)

## 2021-07-05 LAB — T4, FREE: Free T4: 1.1 ng/dL (ref 0.82–1.77)

## 2021-07-07 ENCOUNTER — Other Ambulatory Visit: Payer: Self-pay | Admitting: Family Medicine

## 2021-07-07 ENCOUNTER — Telehealth: Payer: Self-pay

## 2021-07-07 DIAGNOSIS — C61 Malignant neoplasm of prostate: Secondary | ICD-10-CM

## 2021-07-07 DIAGNOSIS — E034 Atrophy of thyroid (acquired): Secondary | ICD-10-CM

## 2021-07-07 MED ORDER — LEVOTHYROXINE SODIUM 25 MCG PO TABS
25.0000 ug | ORAL_TABLET | Freq: Every day | ORAL | 3 refills | Status: DC
Start: 2021-07-07 — End: 2022-04-24

## 2021-07-07 NOTE — Telephone Encounter (Signed)
I called and spoke with patients wife-- wife states when they saw Dr. Delton Coombes in August they had discussed coming to see Dr. Jeffie Pollock to start back prostate cancer treatment.   Wife is concerning on waiting 3 months. Message sent to MD for advice.

## 2021-07-07 NOTE — Telephone Encounter (Signed)
Patient wife called and notified of Dr. Jeffie Pollock message. Wife voiced understanding. Appointment rescheduled for next eligard date.

## 2021-07-07 NOTE — Telephone Encounter (Signed)
-----   Message from Irine Seal, MD sent at 07/07/2021  9:43 AM EDT ----- His PSA is up further to 4.4.   Please forward this result to Dr. Delton Coombes.   He is on my schedule for 10/27 when I will not be here.  Please set him up to see me I 3 months with a PSA.  ----- Message ----- From: Mardelle Matte, CMA Sent: 07/07/2021   8:42 AM EDT To: Irine Seal, MD  Please review

## 2021-07-10 ENCOUNTER — Ambulatory Visit: Payer: Medicare Other | Admitting: Urology

## 2021-07-17 ENCOUNTER — Other Ambulatory Visit: Payer: Self-pay | Admitting: Family Medicine

## 2021-07-17 DIAGNOSIS — N183 Chronic kidney disease, stage 3 unspecified: Secondary | ICD-10-CM

## 2021-07-18 ENCOUNTER — Telehealth: Payer: Self-pay

## 2021-07-18 NOTE — Chronic Care Management (AMB) (Signed)
  Chronic Care Management   Note  07/18/2021 Name: Erik Mullins MRN: [id number] DOB: [birth year]  Erik Mullins is a 87 y.o. year old male who is a primary care patient of Janora Norlander, DO. I reached out to Erik Mullins by phone today in response to a referral sent by [name] [facility] PCP.  Erik Mullins was given information about Chronic Care Management services today including:  CCM service includes personalized support from designated clinical staff supervised by his physician, including individualized plan of care and coordination with other care providers 24/7 contact phone numbers for assistance for urgent and routine care needs. Service will only be billed when office clinical staff spend 20 minutes or more in a month to coordinate care. Only one practitioner may furnish and bill the service in a calendar month. The patient may stop CCM services at any time (effective at the end of the month) by phone call to the office staff. The patient is responsible for co-pay (up to 20% after annual deductible is met) if co-pay is required by the individual health plan.   Patient agreed to services and verbal consent obtained.   Follow up plan: Telephone appointment with care management team member scheduled for:08/22/2021  Noreene Larsson, [place], [place], [place] [address] Direct Dial: [phone] [email or web address]

## 2021-07-28 ENCOUNTER — Ambulatory Visit (INDEPENDENT_AMBULATORY_CARE_PROVIDER_SITE_OTHER): Payer: Medicare Other

## 2021-07-28 ENCOUNTER — Other Ambulatory Visit: Payer: Self-pay | Admitting: Family Medicine

## 2021-07-28 VITALS — Ht 70.0 in | Wt 173.0 lb

## 2021-07-28 DIAGNOSIS — J449 Chronic obstructive pulmonary disease, unspecified: Secondary | ICD-10-CM

## 2021-07-28 DIAGNOSIS — Z0000 Encounter for general adult medical examination without abnormal findings: Secondary | ICD-10-CM

## 2021-07-28 NOTE — Patient Instructions (Addendum)
Erik Mullins , Thank you for taking time to come for your Medicare Wellness Visit. I appreciate your ongoing commitment to your health goals. Please review the following plan we discussed and let me know if I can assist you in the future.   Screening recommendations/referrals: Colonoscopy: No longer required Recommended yearly ophthalmology/optometry visit for glaucoma screening and checkup Recommended yearly dental visit for hygiene and checkup  Vaccinations: Influenza vaccine: Done 07/04/2021 - Repeat annually Pneumococcal vaccine: Done 06/20/2013 & 04/02/2021 Tdap vaccine: Done 02/16/2013 - Repeat in 10 years Shingles vaccine: Due   Covid-19: Done 11/08/2019, 12/06/2019, & 07/24/2020  Advanced directives: Please bring a copy of your health care power of attorney and living will to the office to be added to your chart at your convenience.   Conditions/risks identified: Aim for 30 minutes of exercise or brisk walking each day, drink 6-8 glasses of water and eat lots of fruits and vegetables.   Next appointment: Follow up in one year for your annual wellness visit.   Preventive Care 50 Years and Older, Male  Preventive care refers to lifestyle choices and visits with your health care provider that can promote health and wellness. What does preventive care include? A yearly physical exam. This is also called an annual well check. Dental exams once or twice a year. Routine eye exams. Ask your health care provider how often you should have your eyes checked. Personal lifestyle choices, including: Daily care of your teeth and gums. Regular physical activity. Eating a healthy diet. Avoiding tobacco and drug use. Limiting alcohol use. Practicing safe sex. Taking low doses of aspirin every day. Taking vitamin and mineral supplements as recommended by your health care provider. What happens during an annual well check? The services and screenings done by your health care provider during your  annual well check will depend on your age, overall health, lifestyle risk factors, and family history of disease. Counseling  Your health care provider may ask you questions about your: Alcohol use. Tobacco use. Drug use. Emotional well-being. Home and relationship well-being. Sexual activity. Eating habits. History of falls. Memory and ability to understand (cognition). Work and work Statistician. Screening  You may have the following tests or measurements: Height, weight, and BMI. Blood pressure. Lipid and cholesterol levels. These may be checked every 5 years, or more frequently if you are over 51 years old. Skin check. Lung cancer screening. You may have this screening every year starting at age 36 if you have a 30-pack-year history of smoking and currently smoke or have quit within the past 15 years. Fecal occult blood test (FOBT) of the stool. You may have this test every year starting at age 98. Flexible sigmoidoscopy or colonoscopy. You may have a sigmoidoscopy every 5 years or a colonoscopy every 10 years starting at age 28. Prostate cancer screening. Recommendations will vary depending on your family history and other risks. Hepatitis C blood test. Hepatitis B blood test. Sexually transmitted disease (STD) testing. Diabetes screening. This is done by checking your blood sugar (glucose) after you have not eaten for a while (fasting). You may have this done every 1-3 years. Abdominal aortic aneurysm (AAA) screening. You may need this if you are a current or former smoker. Osteoporosis. You may be screened starting at age 6 if you are at high risk. Talk with your health care provider about your test results, treatment options, and if necessary, the need for more tests. Vaccines  Your health care provider may recommend certain vaccines, such  as: Influenza vaccine. This is recommended every year. Tetanus, diphtheria, and acellular pertussis (Tdap, Td) vaccine. You may need a Td  booster every 10 years. Zoster vaccine. You may need this after age 62. Pneumococcal 13-valent conjugate (PCV13) vaccine. One dose is recommended after age 55. Pneumococcal polysaccharide (PPSV23) vaccine. One dose is recommended after age 65. Talk to your health care provider about which screenings and vaccines you need and how often you need them. This information is not intended to replace advice given to you by your health care provider. Make sure you discuss any questions you have with your health care provider. Document Released: 09/27/2015 Document Revised: 05/20/2016 Document Reviewed: 07/02/2015 Elsevier Interactive Patient Education  2017 [place] Prevention in the Home Falls can cause injuries. They can happen to people of all ages. There are many things you can do to make your home safe and to help prevent falls. What can I do on the outside of my home? Regularly fix the edges of walkways and driveways and fix any cracks. Remove anything that might make you trip as you walk through a door, such as a raised step or threshold. Trim any bushes or trees on the path to your home. Use bright outdoor lighting. Clear any walking paths of anything that might make someone trip, such as rocks or tools. Regularly check to see if handrails are loose or broken. Make sure that both sides of any steps have handrails. Any raised decks and porches should have guardrails on the edges. Have any leaves, snow, or ice cleared regularly. Use sand or salt on walking paths during winter. Clean up any spills in your garage right away. This includes oil or grease spills. What can I do in the bathroom? Use night lights. Install grab bars by the toilet and in the tub and shower. Do not use towel bars as grab bars. Use non-skid mats or decals in the tub or shower. If you need to sit down in the shower, use a plastic, non-slip stool. Keep the floor dry. Clean up any water that spills on the floor  as soon as it happens. Remove soap buildup in the tub or shower regularly. Attach bath mats securely with double-sided non-slip rug tape. Do not have throw rugs and other things on the floor that can make you trip. What can I do in the bedroom? Use night lights. Make sure that you have a light by your bed that is easy to reach. Do not use any sheets or blankets that are too big for your bed. They should not hang down onto the floor. Have a firm chair that has side arms. You can use this for support while you get dressed. Do not have throw rugs and other things on the floor that can make you trip. What can I do in the kitchen? Clean up any spills right away. Avoid walking on wet floors. Keep items that you use a lot in easy-to-reach places. If you need to reach something above you, use a strong step stool that has a grab bar. Keep electrical cords out of the way. Do not use floor polish or wax that makes floors slippery. If you must use wax, use non-skid floor wax. Do not have throw rugs and other things on the floor that can make you trip. What can I do with my stairs? Do not leave any items on the stairs. Make sure that there are handrails on both sides of the stairs and use  them. Fix handrails that are broken or loose. Make sure that handrails are as long as the stairways. Check any carpeting to make sure that it is firmly attached to the stairs. Fix any carpet that is loose or worn. Avoid having throw rugs at the top or bottom of the stairs. If you do have throw rugs, attach them to the floor with carpet tape. Make sure that you have a light switch at the top of the stairs and the bottom of the stairs. If you do not have them, ask someone to add them for you. What else can I do to help prevent falls? Wear shoes that: Do not have high heels. Have rubber bottoms. Are comfortable and fit you well. Are closed at the toe. Do not wear sandals. If you use a stepladder: Make sure that it is  fully opened. Do not climb a closed stepladder. Make sure that both sides of the stepladder are locked into place. Ask someone to hold it for you, if possible. Clearly mark and make sure that you can see: Any grab bars or handrails. First and last steps. Where the edge of each step is. Use tools that help you move around (mobility aids) if they are needed. These include: Canes. Walkers. Scooters. Crutches. Turn on the lights when you go into a dark area. Replace any light bulbs as soon as they burn out. Set up your furniture so you have a clear path. Avoid moving your furniture around. If any of your floors are uneven, fix them. If there are any pets around you, be aware of where they are. Review your medicines with your doctor. Some medicines can make you feel dizzy. This can increase your chance of falling. Ask your doctor what other things that you can do to help prevent falls. This information is not intended to replace advice given to you by your health care provider. Make sure you discuss any questions you have with your health care provider. Document Released: 06/27/2009 Document Revised: 02/06/2016 Document Reviewed: 10/05/2014 Elsevier Interactive Patient Education  2017 [employer].

## 2021-07-28 NOTE — Progress Notes (Signed)
Subjective:   Erik Mullins is a 87 y.o. male who presents for Medicare Annual/Subsequent preventive examination.  Virtual Visit via Telephone Note  I connected with  Erik Mullins on 07/28/21 at  3:30 PM EST by telephone and verified that I am speaking with the correct person using two identifiers.  Location: Patient: Home Provider: [facility] Persons participating in the virtual visit: patient/wife, Erik Mullins/Nurse Health Advisor   I discussed the limitations, risks, security and privacy concerns of performing an evaluation and management service by telephone and the availability of in person appointments. The patient expressed understanding and agreed to proceed.  Interactive audio and video telecommunications were attempted between this nurse and patient, however failed, due to patient having technical difficulties OR patient did not have access to video capability.  We continued and completed visit with audio only.  Some vital signs may be absent or patient reported.   [name] E [name], LPN   Review of Systems     Cardiac Risk Factors include: advanced age (>69men, >60 women);dyslipidemia;hypertension;male gender;sedentary lifestyle;smoking/ tobacco exposure;Other (see comment), Risk factor comments: A.Fib, COPD     Objective:    Today's Vitals   07/28/21 1459 07/28/21 1500  Weight: 173 lb (78.5 kg)   Height: 5\' 10"  (1.778 m)   PainSc:  5    Body mass index is 24.82 kg/m.  Advanced Directives 07/28/2021 05/15/2021 05/14/2021 05/07/2021 02/18/2021 09/09/2020 07/15/2020  Does Patient Have a Medical Advance Directive? Yes Yes Yes Yes Yes Yes Yes  Type of Paramedic of [place];Living will [place];Living will [place];Living will [place];Living will Living will;Healthcare Power of [place];Living will [place];Living will  Does patient want to make changes to  medical advance directive? - - - No - Patient declined No - Patient declined - No - Patient declined  Copy of [place] in Chart? No - copy requested - - No - copy requested No - copy requested No - copy requested No - copy requested    Current Medications (verified) Outpatient Encounter Medications as of 07/28/2021  Medication Sig   acetaminophen (TYLENOL) 500 MG tablet Take 1,000 mg by mouth every 6 (six) hours as needed for headache.   albuterol (VENTOLIN HFA) 108 (90 Base) MCG/ACT inhaler Inhale 1-2 puffs into the lungs every 6 (six) hours as needed for wheezing or shortness of breath. (Patient taking differently: Inhale 2 puffs into the lungs every 6 (six) hours as needed for wheezing or shortness of breath.)   allopurinol (ZYLOPRIM) 100 MG tablet TAKE 1 TABLET BY MOUTH EVERY DAY   ANORO ELLIPTA 62.5-25 MCG/ACT AEPB INHALE 1 PUFF BY MOUTH EVERY DAY   betamethasone valerate ointment (VALISONE) 0.1 % APPLY TOPICALLY 2TIMES DAILY. AVOID FACE, AXILLA AND GROIN. (USE AS NEEDED FOR PSORIASIS FLARES)   Calcium Carbonate-Vitamin D 600-400 MG-UNIT tablet Take 1 tablet by mouth daily.   Carboxymethylcellulose Sodium (ARTIFICIAL TEARS OP) Place 1 drop into both eyes daily as needed (dry eyes).   docusate sodium (COLACE) 250 MG capsule Take 250 mg by mouth at bedtime.   furosemide (LASIX) 20 MG tablet TAKE 1 TABLET BY MOUTH EVERY DAY   levothyroxine (SYNTHROID) 25 MCG tablet Take 1 tablet (25 mcg total) by mouth daily.   loperamide (IMODIUM) 2 MG capsule Take 1 capsule (2 mg total) by mouth 4 (four) times daily as needed for diarrhea or loose stools.   memantine (NAMENDA) 5  MG tablet Take 1 tablet (5 mg total) by mouth 2 (two) times daily. (Patient taking differently: Take 2.5 mg by mouth 2 (two) times daily.)   metoprolol tartrate (LOPRESSOR) 25 MG tablet Take 1 tablet (25 mg total) by mouth 2 (two) times daily.   mirtazapine (REMERON) 7.5 MG tablet TAKE 1 TABLET BY MOUTH EVERYDAY AT  BEDTIME   Multiple Vitamin (MULTIVITAMIN) tablet Take 1 tablet by mouth daily.   XARELTO 15 MG TABS tablet TAKE 1 TABLET (15 MG TOTAL) BY MOUTH DAILY WITH SUPPER.   No facility-administered encounter medications on file as of 07/28/2021.    Allergies (verified) Other and Morphine   History: Past Medical History:  Diagnosis Date   AAA (abdominal aortic aneurysm)    Remote ~1994    Anemia    Arthritis    Atrial fibrillation [facility])    Diagnosed December 2014   AVNRT (AV nodal re-entry tachycardia) ([place])    Possible   Cataract    Bilateral   Cirrhosis ([place])    CKD (chronic kidney disease) stage 3, GFR 30-59 ml/min (HCC)    Essential hypertension    Gout    History of GI bleed    History of gout    History of hepatitis    History of radiation therapy 07/18/2020-08/06/2020   L Lung SBRT; Dr. Gery Pray   History of stroke    Hyperlipidemia    PAD (peripheral artery disease) ([place])    Stenting in lower extremities (no records)   Prostate cancer ([place])    XRT; in remission   Type 2 diabetes mellitus ([place])    Past Surgical History:  Procedure Laterality Date   ABDOMINAL AORTIC ANEURYSM REPAIR  1980's   in [place] Right ~ 1999   CATARACT EXTRACTION W/ INTRAOCULAR LENS  IMPLANT, BILATERAL Bilateral 2014   CHOLECYSTECTOMY     COLON SURGERY     PARTIAL COLECTOMY     "I was bleeding to death inside; took out 2/3 of my colon" (08/22/2013)   Family History  Problem Relation Age of Onset   Cancer Mother        uterine deceased age 38   Hypertension Sister    Arrhythmia Other        Atrial fibrillation   Hypertension Maternal Grandmother    Social History   Socioeconomic History   Marital status: Married    Spouse name: Erik Mullins   Number of children: 2   Years of education: Not on file   Highest education level: Not on file  Occupational History   Occupation: retired    Comment: [employer]  Tobacco Use   Smoking  status: Former    Packs/day: 1.00    Years: 70.00    Pack years: 70.00    Types: Cigarettes    Start date: 09/11/1944    Quit date: 02/17/2020    Years since quitting: 1.4   Smokeless tobacco: Former    Types: Snuff, Chew  Vaping Use   Vaping Use: Never used  Substance and Sexual Activity   Alcohol use: Not Currently    Alcohol/week: 0.0 standard drinks    Comment: 08/22/2013 "drink ~ 1 beer and wine/yr"   Drug use: No   Sexual activity: Never  Other Topics Concern   Not on file  Social History Narrative   From [place]. Relocated to this area from [facility].   Social Determinants of Health   Financial Resource Strain:  Low Risk    Difficulty of Paying Living Expenses: Not hard at all  Food Insecurity: No Food Insecurity   Worried About Running Out of Food in the Last Year: Never true   Ran Out of Food in the Last Year: Never true  Transportation Needs: No Transportation Needs   Lack of Transportation (Medical): No   Lack of Transportation (Non-Medical): No  Physical Activity: Inactive   Days of Exercise per Week: 0 days   Minutes of Exercise per Session: 0 min  Stress: No Stress Concern Present   Feeling of Stress : Only a little  Social Connections: Engineer, building services of Communication with Friends and Family: Never   Frequency of Social Gatherings with Friends and Family: More than three times a week   Attends Religious Services: More than 4 times per year   Active Member of [employer] or Organizations: Yes   Attends Archivist Meetings: 1 to 4 times per year   Marital Status: Married    Tobacco Counseling Counseling given: Not Answered   Clinical Intake:  Pre-visit preparation completed: Yes  Pain : 0-10 Pain Score: 5  Pain Type: Chronic pain Pain Location: Back Pain Orientation: Lower Pain Descriptors / Indicators: Aching, Discomfort, Sore Pain Onset: More than a month ago Pain Frequency: Intermittent     BMI - recorded:  24.82 Nutritional Status: BMI of 19-24  Normal Nutritional Risks: None Diabetes: No  How often do you need to have someone help you when you read instructions, pamphlets, or other written materials from your doctor or pharmacy?: 1 - Never  Diabetic? no  Interpreter Needed?: No  Information entered by :: Walta [place], LPN   Activities of Daily Living In your present state of health, do you have any difficulty performing the following activities: 07/28/2021  Hearing? Y  Comment wears hearing aids  Vision? N  Difficulty concentrating or making decisions? Y  Walking or climbing stairs? Y  Dressing or bathing? Y  Doing errands, shopping? Y  Preparing Food and eating ? Y  Using the Toilet? N  In the past six months, have you accidently leaked urine? Y  Do you have problems with loss of bowel control? N  Managing your Medications? Y  Managing your Finances? Y  Housekeeping or managing your Housekeeping? Y  Some recent data might be hidden    Patient Care Team: Janora Norlander, DO as PCP - General (Family Medicine) Satira Sark, MD as PCP - Cardiology (Cardiology) Satira Sark, MD (Cardiology) Irine Seal, MD as Attending Physician (Urology) Lavera Guise, [facility] as [place] Management (Pharmacist)  Indicate any recent Medical Services you may have received from other than [facility] providers in the past year (date may be approximate).     Assessment:   This is a routine wellness examination for [place].  Hearing/Vision screen Hearing Screening - Comments:: Wears hearing aids - from [place]:: Wears rx glasses prn - up to date with annual eye exams at [facility] in [place]  Dietary issues and exercise activities discussed: Current Exercise Habits: The patient does not participate in regular exercise at present, Exercise limited by: cardiac condition(s);respiratory conditions(s);psychological  condition(s)   Goals Addressed             This Visit's Progress    Exercise 3x per week (30 min per time)         Depression Screen [facility] 2/9 Scores 07/28/2021 07/04/2021 04/02/2021  08/06/2020 08/06/2020 07/09/2020 07/09/2020  PHQ - 2 Score 4 4 2 4  0 0 0  PHQ- 9 Score 11 11 16 12  - - -    Fall Risk Fall Risk  07/28/2021 07/04/2021 04/02/2021 11/13/2020 08/06/2020  Falls in the past year? 1 1 0 1 1  Number falls in past yr: 0 0 - 0 0  Injury with Fall? 0 0 - 0 1  Risk for fall due to : History of fall(s);Mental status change History of fall(s) - History of fall(s);Impaired mobility History of fall(s)  Follow up Education provided;Falls prevention discussed Education provided - Falls prevention discussed Falls evaluation completed    FALL RISK PREVENTION PERTAINING TO THE HOME:  Any stairs in or around the home? Yes  If so, are there any without handrails? No  Home free of loose throw rugs in walkways, pet beds, electrical cords, etc? Yes  Adequate lighting in your home to reduce risk of falls? Yes   ASSISTIVE DEVICES UTILIZED TO PREVENT FALLS:  Life alert? No  Use of a cane, walker or w/c? No  Grab bars in the bathroom? Yes  Shower chair or bench in shower? Yes  Elevated toilet seat or a handicapped toilet? No   TIMED UP AND GO:  Was the test performed? No . Telephonic visit  Cognitive Function: Cognitive status assessed by direct observation. Patient has current diagnosis of cognitive impairment. Patient is unable to complete screening 6CIT or MMSE.   MMSE - Mini Mental State Exam 07/28/2021 01/05/2020 05/26/2019 10/04/2017  Not completed: Unable to complete - - -  Orientation to time - 4 5 2   Orientation to Place - 4 5 4   Registration - 3 3 2   Attention/ Calculation - 4 4 5   Recall - 0 0 3  Language- name 2 objects - 2 2 2   Language- repeat - 1 1 1   Language- follow 3 step command - 3 3 3   Language- read & follow direction - 1 1 1   Write a sentence - 1 1 1   Copy  design - 1 1 1   Total score - 24 26 25         Immunizations Immunization History  Administered Date(s) Administered   Fluad Quad(high Dose 65+) 05/26/2019, 06/27/2020, 07/04/2021   Influenza, High Dose Seasonal PF 07/22/2016   Influenza,inj,Quad PF,6+ Mos 07/31/2014, 08/29/2015   Influenza-Unspecified 09/26/2018   Moderna SARS-COV2 Booster Vaccination 07/24/2020   Moderna Sars-Covid-2 Vaccination 11/08/2019, 12/06/2019   Pneumococcal Conjugate-13 04/02/2021   Pneumococcal Polysaccharide-23 06/20/2013   Tdap 02/16/2013    TDAP status: Up to date  Flu Vaccine status: Up to date  Pneumococcal vaccine status: Up to date  Covid-19 vaccine status: Completed vaccines  Qualifies for Shingles Vaccine? Yes   Zostavax completed Yes   Shingrix Completed?: No.    Education has been provided regarding the importance of this vaccine. Patient has been advised to call insurance company to determine out of pocket expense if they have not yet received this vaccine. Advised may also receive vaccine at local pharmacy or Health Dept. Verbalized acceptance and understanding.  Screening Tests Health Maintenance  Topic Date Due   COVID-19 Vaccine (3 - Moderna risk series) 08/21/2020   Zoster Vaccines- Shingrix (1 of 2) 10/04/2021 (Originally 09/11/1950)   TETANUS/TDAP  02/17/2023   Pneumonia Vaccine 15+ Years old  Completed   INFLUENZA VACCINE  Completed   HPV VACCINES  Aged Out    Health Maintenance  Health Maintenance Due  Topic Date Due  COVID-19 Vaccine (3 - Moderna risk series) 08/21/2020    Colorectal cancer screening: No longer required.   Lung Cancer Screening: (Low Dose CT Chest recommended if Age 57-80 years, 30 pack-year currently smoking OR have quit w/in 15years.) does not qualify  Additional Screening:  Hepatitis C Screening: does not qualify  Vision Screening: Recommended annual ophthalmology exams for early detection of glaucoma and other disorders of the eye. Is  the patient up to date with their annual eye exam?  Yes  Who is the provider or what is the name of the office in which the patient attends annual eye exams? [facility] If pt is not established with a provider, would they like to be referred to a provider to establish care? No .   Dental Screening: Recommended annual dental exams for proper oral hygiene  Community Resource Referral / Chronic Care Management: CRR required this visit?  No   CCM required this visit?  No      Plan:     I have personally reviewed and noted the following in the patient's chart:   Medical and social history Use of alcohol, tobacco or illicit drugs  Current medications and supplements including opioid prescriptions. Patient is not currently taking opioid prescriptions. Functional ability and status Nutritional status Physical activity Advanced directives List of other physicians Hospitalizations, surgeries, and ER visits in previous 12 months Vitals Screenings to include cognitive, depression, and falls Referrals and appointments  In addition, I have reviewed and discussed with patient certain preventive protocols, quality metrics, and best practice recommendations. A written personalized care plan for preventive services as well as general preventive health recommendations were provided to patient.     Sandrea Hammond, LPN   09/47/0962   Nurse Notes: None

## 2021-08-14 ENCOUNTER — Other Ambulatory Visit: Payer: Self-pay | Admitting: Family Medicine

## 2021-08-14 DIAGNOSIS — F418 Other specified anxiety disorders: Secondary | ICD-10-CM

## 2021-08-22 ENCOUNTER — Telehealth: Payer: Medicare Other

## 2021-09-01 ENCOUNTER — Ambulatory Visit (INDEPENDENT_AMBULATORY_CARE_PROVIDER_SITE_OTHER): Payer: Medicare Other | Admitting: Cardiology

## 2021-09-01 ENCOUNTER — Encounter: Payer: Self-pay | Admitting: Cardiology

## 2021-09-01 VITALS — BP 118/72 | HR 88 | Ht 70.0 in | Wt 178.2 lb

## 2021-09-01 DIAGNOSIS — I10 Essential (primary) hypertension: Secondary | ICD-10-CM

## 2021-09-01 DIAGNOSIS — I4821 Permanent atrial fibrillation: Secondary | ICD-10-CM

## 2021-09-01 DIAGNOSIS — N1832 Chronic kidney disease, stage 3b: Secondary | ICD-10-CM

## 2021-09-01 NOTE — Progress Notes (Signed)
Cardiology Office Note  Date: 09/01/2021   ID: Erik Mullins, DOB 1931-08-19, MRN [id number]  PCP:  Janora Norlander, DO  Cardiologist:  Rozann Lesches, MD Electrophysiologist:  None   Chief Complaint  Patient presents with   Cardiac follow-up    History of Present Illness: Erik Mullins is an 87 y.o. male last seen in June by Mr. Leonides Sake NP.  He is here today with his wife for a follow-up visit.  She tells me that his dementia has continued to get worse.  He is still following through the [facility] hospital system.  I reviewed his medications.  He is now on renally adjusted Eliquis for stroke prophylaxis with atrial fibrillation, had previously been on Xarelto.  I reviewed his interval lab work as noted below.  No spontaneous bleeding problems were noted.  I personally reviewed his ECG today which shows rate controlled atrial fibrillation.  He follows with Dr. Delton Coombes with a history of squamous cell lung cancer on the left status post SBRT.  He also has prostate cancer.  Past Medical History:  Diagnosis Date   AAA (abdominal aortic aneurysm)    Remote ~1994    Anemia    Arthritis    Atrial fibrillation [facility])    Diagnosed December 2014   AVNRT (AV nodal re-entry tachycardia) ([place])    Possible   Cataract    Bilateral   Cirrhosis ([place])    CKD (chronic kidney disease) stage 3, GFR 30-59 ml/min (HCC)    Essential hypertension    Gout    History of GI bleed    History of gout    History of hepatitis    History of radiation therapy 07/18/2020-08/06/2020   L Lung SBRT; Dr. Gery Pray   History of stroke    Hyperlipidemia    PAD (peripheral artery disease) ([place])    Stenting in lower extremities (no records)   Prostate cancer ([place])    XRT; in remission   Type 2 diabetes mellitus ([place])     Past Surgical History:  Procedure Laterality Date   ABDOMINAL AORTIC ANEURYSM REPAIR  1980's   in [place] Right ~ [place], BILATERAL Bilateral 2014   CHOLECYSTECTOMY     COLON SURGERY     PARTIAL COLECTOMY     "I was bleeding to death inside; took out 2/3 of my colon" (08/22/2013)    Current Outpatient Medications  Medication Sig Dispense Refill   acetaminophen (TYLENOL) 500 MG tablet Take 1,000 mg by mouth every 6 (six) hours as needed for headache.     albuterol (VENTOLIN HFA) 108 (90 Base) MCG/ACT inhaler Inhale 1-2 puffs into the lungs every 6 (six) hours as needed for wheezing or shortness of breath. (Patient taking differently: Inhale 2 puffs into the lungs every 6 (six) hours as needed for wheezing or shortness of breath.) 18 g 2   allopurinol (ZYLOPRIM) 100 MG tablet TAKE 1 TABLET BY MOUTH EVERY DAY 90 tablet 0   ANORO ELLIPTA 62.5-25 MCG/ACT AEPB INHALE 1 PUFF BY MOUTH EVERY DAY 60 each 1   apixaban (ELIQUIS) 5 MG TABS tablet Take 2.5 mg by mouth 2 (two) times daily.     betamethasone valerate ointment (VALISONE) 0.1 % APPLY TOPICALLY 2TIMES DAILY. AVOID FACE, AXILLA AND GROIN. (USE AS NEEDED FOR PSORIASIS FLARES) 90 g 1   Calcium Carbonate-Vitamin D 600-400 MG-UNIT tablet Take 1  tablet by mouth daily.     Carboxymethylcellulose Sodium (ARTIFICIAL TEARS OP) Place 1 drop into both eyes daily as needed (dry eyes).     docusate sodium (COLACE) 250 MG capsule Take 250 mg by mouth at bedtime.     furosemide (LASIX) 20 MG tablet TAKE 1 TABLET BY MOUTH EVERY DAY 90 tablet 1   levothyroxine (SYNTHROID) 25 MCG tablet Take 1 tablet (25 mcg total) by mouth daily. 90 tablet 3   loperamide (IMODIUM) 2 MG capsule Take 1 capsule (2 mg total) by mouth 4 (four) times daily as needed for diarrhea or loose stools. 12 capsule 0   memantine (NAMENDA) 5 MG tablet Take 1 tablet (5 mg total) by mouth 2 (two) times daily. (Patient taking differently: Take 2.5 mg by mouth 2 (two) times daily.) 180 tablet 3   metoprolol tartrate (LOPRESSOR) 25 MG tablet Take 1 tablet (25 mg total) by  mouth 2 (two) times daily. 180 tablet 3   mirtazapine (REMERON) 7.5 MG tablet TAKE 1 TABLET BY MOUTH EVERYDAY AT BEDTIME 90 tablet 0   Multiple Vitamin (MULTIVITAMIN) tablet Take 1 tablet by mouth daily.     No current facility-administered medications for this visit.   Allergies:  Other and Morphine   ROS: Chronic hearing loss, progressive memory loss.  Physical Exam: VS:  BP 118/72    Pulse 88    Ht 5\' 10"  (1.778 m)    Wt 178 lb 3.2 oz (80.8 kg)    SpO2 91%    BMI 25.57 kg/m , BMI Body mass index is 25.57 kg/m.  Wt Readings from Last 3 Encounters:  09/01/21 178 lb 3.2 oz (80.8 kg)  07/28/21 173 lb (78.5 kg)  07/04/21 175 lb 12.8 oz (79.7 kg)    General: Elderly male, appears comfortable at rest. HEENT: Conjunctiva and lids normal, wearing a mask. Neck: Supple, no elevated JVP or carotid bruits, no thyromegaly. Lungs: Clear to auscultation, nonlabored breathing at rest. Cardiac: Irregularly irregular, no S3 or significant systolic murmur. Extremities: No pitting edema.  ECG:  An ECG dated 07/25/2020 was personally reviewed today and demonstrated:  Atrial fibrillation.  Recent Labwork: 05/14/2021: ALT 12; AST 22 05/15/2021: Magnesium 2.0 07/04/2021: BUN 31; Creatinine, Ser 1.86; Hemoglobin 15.3; Platelets 193; Potassium 5.4; Sodium 139; TSH 9.070     Component Value Date/Time   CHOL 194 11/29/2017 0905   CHOL 122 02/07/2013 0903   TRIG 187 (H) 11/29/2017 0905   TRIG 168 (H) 10/24/2013 0909   TRIG 140 02/07/2013 0903   HDL 58 11/29/2017 0905   HDL 44 10/24/2013 0909   HDL 41 02/07/2013 0903   CHOLHDL 3.3 11/29/2017 0905   LDLCALC 99 11/29/2017 0905   LDLCALC 69 10/24/2013 0909   LDLCALC 53 02/07/2013 0903    Other Studies Reviewed Today:  Echocardiogram 01/14/2021:  1. Left ventricular ejection fraction, by estimation, is 60 to 65%. The  left ventricle has normal function. The left ventricle has no regional  wall motion abnormalities. Left ventricular diastolic  parameters are  indeterminate.   2. RV-RA gradient 41 mmHg. Right ventricular systolic function is mildly  reduced. The right ventricular size is mildly enlarged.   3. The mitral valve is grossly normal. Mild mitral valve regurgitation.   4. The aortic valve is tricuspid. There is moderate calcification of the  aortic valve. Aortic valve regurgitation is not visualized. Mild to  moderate aortic valve sclerosis/calcification is present, without any  evidence of aortic stenosis.   5. Unable to  estimate CVP.   Assessment and Plan:  1.  Permanent atrial fibrillation with CHA2DS2-VASc score of 6.  He is now on Eliquis 2.5 mg twice daily for stroke prophylaxis, no spontaneous bleeding problems.  I reviewed his interval lab work as noted above.  ECG shows rate controlled atrial fibrillation today, he is asymptomatic in terms of palpitations.  2.  CKD stage IIIb, creatinine 1.86.  3.  Essential hypertension, blood pressure is well controlled today.  He is on Lopressor.  4.  History of PAD and prior AAA repair.  He remains asymptomatic with conservative follow-up.  Medication Adjustments/Labs and Tests Ordered: Current medicines are reviewed at length with the patient today.  Concerns regarding medicines are outlined above.   Tests Ordered: Orders Placed This Encounter  Procedures   EKG 12-Lead    Medication Changes: No orders of the defined types were placed in this encounter.   Disposition:  Follow up  6 months.  Signed, Satira Sark, MD, [facility] 09/01/2021 1:20 PM    [facility] [place], [place], [place] [address] Phone: [phone]; Fax: [phone]

## 2021-09-01 NOTE — Patient Instructions (Signed)

## 2021-09-19 ENCOUNTER — Other Ambulatory Visit: Payer: Self-pay | Admitting: Cardiology

## 2021-09-26 ENCOUNTER — Telehealth (INDEPENDENT_AMBULATORY_CARE_PROVIDER_SITE_OTHER): Payer: Self-pay

## 2021-10-02 ENCOUNTER — Telehealth: Payer: Self-pay | Admitting: Family Medicine

## 2021-10-02 DIAGNOSIS — R972 Elevated prostate specific antigen [PSA]: Secondary | ICD-10-CM

## 2021-10-03 ENCOUNTER — Other Ambulatory Visit: Payer: Self-pay | Admitting: Family Medicine

## 2021-10-03 ENCOUNTER — Other Ambulatory Visit: Payer: Medicare Other

## 2021-10-03 DIAGNOSIS — N1832 Chronic kidney disease, stage 3b: Secondary | ICD-10-CM

## 2021-10-03 DIAGNOSIS — C61 Malignant neoplasm of prostate: Secondary | ICD-10-CM | POA: Diagnosis not present

## 2021-10-03 DIAGNOSIS — E034 Atrophy of thyroid (acquired): Secondary | ICD-10-CM

## 2021-10-03 DIAGNOSIS — I480 Paroxysmal atrial fibrillation: Secondary | ICD-10-CM

## 2021-10-03 DIAGNOSIS — E7800 Pure hypercholesterolemia, unspecified: Secondary | ICD-10-CM

## 2021-10-03 NOTE — Telephone Encounter (Signed)
Ok to add prostate labs. Other labs are already ordered

## 2021-10-03 NOTE — Telephone Encounter (Signed)
PSA added and left message advising requested labs ordered and to call back with any further questions or concerns.

## 2021-10-04 LAB — CBC
Hematocrit: 45.7 % (ref 37.5–51.0)
Hemoglobin: 15.9 g/dL (ref 13.0–17.7)
MCH: 29.7 pg (ref 26.6–33.0)
MCHC: 34.8 g/dL (ref 31.5–35.7)
MCV: 85 fL (ref 79–97)
Platelets: 143 10*3/uL — ABNORMAL LOW (ref 150–450)
RBC: 5.36 x10E6/uL (ref 4.14–5.80)
RDW: 13.2 % (ref 11.6–15.4)
WBC: 7.2 10*3/uL (ref 3.4–10.8)

## 2021-10-04 LAB — CMP14+EGFR
ALT: 9 IU/L (ref 0–44)
AST: 19 IU/L (ref 0–40)
Albumin/Globulin Ratio: 1.6 (ref 1.2–2.2)
Albumin: 4.5 g/dL (ref 3.6–4.6)
Alkaline Phosphatase: 134 IU/L — ABNORMAL HIGH (ref 44–121)
BUN/Creatinine Ratio: 15 (ref 10–24)
BUN: 33 mg/dL — ABNORMAL HIGH (ref 8–27)
Bilirubin Total: 0.4 mg/dL (ref 0.0–1.2)
CO2: 26 mmol/L (ref 20–29)
Calcium: 9.6 mg/dL (ref 8.6–10.2)
Chloride: 98 mmol/L (ref 96–106)
Creatinine, Ser: 2.2 mg/dL — ABNORMAL HIGH (ref 0.76–1.27)
Globulin, Total: 2.8 g/dL (ref 1.5–4.5)
Glucose: 96 mg/dL (ref 70–99)
Potassium: 5 mmol/L (ref 3.5–5.2)
Sodium: 140 mmol/L (ref 134–144)
Total Protein: 7.3 g/dL (ref 6.0–8.5)
eGFR: 28 mL/min/{1.73_m2} — ABNORMAL LOW (ref >59)

## 2021-10-04 LAB — LIPID PANEL
Chol/HDL Ratio: 3.7 ratio (ref 0.0–5.0)
Cholesterol, Total: 190 mg/dL (ref 100–199)
HDL: 51 mg/dL (ref >39)
LDL Chol Calc (NIH): 98 mg/dL (ref 0–99)
Triglycerides: 245 mg/dL — ABNORMAL HIGH (ref 0–149)
VLDL Cholesterol Cal: 41 mg/dL — ABNORMAL HIGH (ref 5–40)

## 2021-10-04 LAB — T4, FREE: Free T4: 1.18 ng/dL (ref 0.82–1.77)

## 2021-10-04 LAB — PSA: Prostate Specific Ag, Serum: 5.6 ng/mL — ABNORMAL HIGH (ref 0.0–4.0)

## 2021-10-04 LAB — TSH: TSH: 5.69 u[IU]/mL — ABNORMAL HIGH (ref 0.450–4.500)

## 2021-10-06 ENCOUNTER — Encounter: Payer: Self-pay | Admitting: Family Medicine

## 2021-10-06 ENCOUNTER — Ambulatory Visit (INDEPENDENT_AMBULATORY_CARE_PROVIDER_SITE_OTHER): Payer: Medicare Other | Admitting: Family Medicine

## 2021-10-06 VITALS — BP 125/72 | HR 82 | Temp 97.8°F | Ht 70.0 in | Wt 177.4 lb

## 2021-10-06 DIAGNOSIS — N1832 Chronic kidney disease, stage 3b: Secondary | ICD-10-CM | POA: Diagnosis not present

## 2021-10-06 DIAGNOSIS — F0150 Vascular dementia without behavioral disturbance: Secondary | ICD-10-CM

## 2021-10-06 DIAGNOSIS — E7800 Pure hypercholesterolemia, unspecified: Secondary | ICD-10-CM | POA: Diagnosis not present

## 2021-10-06 DIAGNOSIS — F418 Other specified anxiety disorders: Secondary | ICD-10-CM | POA: Diagnosis not present

## 2021-10-06 DIAGNOSIS — E034 Atrophy of thyroid (acquired): Secondary | ICD-10-CM | POA: Diagnosis not present

## 2021-10-06 DIAGNOSIS — I480 Paroxysmal atrial fibrillation: Secondary | ICD-10-CM | POA: Diagnosis not present

## 2021-10-06 DIAGNOSIS — L89312 Pressure ulcer of right buttock, stage 2: Secondary | ICD-10-CM | POA: Diagnosis not present

## 2021-10-06 MED ORDER — MIRTAZAPINE 15 MG PO TABS: 15.0000 mg | ORAL_TABLET | Freq: Every day | ORAL | 3 refills | Status: DC

## 2021-10-06 MED ORDER — SILVER SULFADIAZINE 1 % EX CREA: 1.0000 "application " | TOPICAL_CREAM | Freq: Every day | CUTANEOUS | 0 refills | Status: DC

## 2021-10-06 NOTE — Progress Notes (Signed)
Subjective: CC: Follow-up dementia  PCP: Janora Norlander, DO KGM:WNUU T Erik Mullins is a 88 y.o. male presenting to clinic today for:  1.  Dementia He is accompanied today's visit by his wife, he does feel like his dementia may be getting worse.  She noticed that he seems to be more depressed and that he is more easily angered.  He frequently is word searching or trying to remember something that he cannot and this frustrates him.  She sometimes wonders if he hears or sees things that others cannot.  His sleep has been good.  His appetite has been slightly down but he has complained of some chewing difficulty off and on.  No choking or aspiration.  No incontinence.  2.  Sore Patient has a sore in his right buttock.  His wife notes that it was looking worse than current but it still present they want [place] to take a look.  No bleeding or drainage reported.  No fevers.  3.  COPD Compliant with inhaler.  He started smoking again and smoking 1 to 2 cigarettes/day.  He gets him out of his chair so she is not totally upset about this.  4.  Atrial fibrillation/hypothyroidism Patient is compliant with his medications.  He is currently on half dose of Eliquis at 2.5 mg twice daily.  No bleeding reported.  Compliant with Synthroid at current dose.  TSH was noted to be slightly elevated but given atrial fibrillation no medication changes have been made.  ROS: Per HPI  Allergies  Allergen Reactions   Other Other (See Comments)   Morphine Other (See Comments)    hallucinate   Past Medical History:  Diagnosis Date   AAA (abdominal aortic aneurysm)    Remote ~1994    Anemia    Arthritis    Atrial fibrillation [facility])    Diagnosed December 2014   AVNRT (AV nodal re-entry tachycardia) ([place])    Possible   Cataract    Bilateral   Cirrhosis ([place])    CKD (chronic kidney disease) stage 3, GFR 30-59 ml/min (HCC)    Essential hypertension    Gout    History of GI bleed    History of gout    History  of hepatitis    History of radiation therapy 07/18/2020-08/06/2020   L Lung SBRT; Dr. Gery Pray   History of stroke    Hyperlipidemia    PAD (peripheral artery disease) ([place])    Stenting in lower extremities (no records)   Prostate cancer ([place])    XRT; in remission   Type 2 diabetes mellitus ([place])     Current Outpatient Medications:    acetaminophen (TYLENOL) 500 MG tablet, Take 1,000 mg by mouth every 6 (six) hours as needed for headache., Disp: , Rfl:    albuterol (VENTOLIN HFA) 108 (90 Base) MCG/ACT inhaler, Inhale 1-2 puffs into the lungs every 6 (six) hours as needed for wheezing or shortness of breath. (Patient taking differently: Inhale 2 puffs into the lungs every 6 (six) hours as needed for wheezing or shortness of breath.), Disp: 18 g, Rfl: 2   allopurinol (ZYLOPRIM) 100 MG tablet, TAKE 1 TABLET BY MOUTH EVERY DAY, Disp: 90 tablet, Rfl: 0   ANORO ELLIPTA 62.5-25 MCG/ACT AEPB, INHALE 1 PUFF BY MOUTH EVERY DAY, Disp: 60 each, Rfl: 1   apixaban (ELIQUIS) 5 MG TABS tablet, Take 2.5 mg by mouth 2 (two) times daily., Disp: , Rfl:    betamethasone valerate ointment (VALISONE) 0.1 %, APPLY  TOPICALLY 2TIMES DAILY. AVOID FACE, AXILLA AND GROIN. (USE AS NEEDED FOR PSORIASIS FLARES), Disp: 90 g, Rfl: 1   Calcium Carbonate-Vitamin D 600-400 MG-UNIT tablet, Take 1 tablet by mouth daily., Disp: , Rfl:    Carboxymethylcellulose Sodium (ARTIFICIAL TEARS OP), Place 1 drop into both eyes daily as needed (dry eyes)., Disp: , Rfl:    docusate sodium (COLACE) 250 MG capsule, Take 250 mg by mouth at bedtime., Disp: , Rfl:    furosemide (LASIX) 20 MG tablet, TAKE 1 TABLET BY MOUTH EVERY DAY, Disp: 90 tablet, Rfl: 1   levothyroxine (SYNTHROID) 25 MCG tablet, Take 1 tablet (25 mcg total) by mouth daily., Disp: 90 tablet, Rfl: 3   loperamide (IMODIUM) 2 MG capsule, Take 1 capsule (2 mg total) by mouth 4 (four) times daily as needed for diarrhea or loose stools., Disp: 12 capsule, Rfl: 0   memantine  (NAMENDA) 5 MG tablet, Take 1 tablet (5 mg total) by mouth 2 (two) times daily. (Patient taking differently: Take 2.5 mg by mouth 2 (two) times daily.), Disp: 180 tablet, Rfl: 3   metoprolol tartrate (LOPRESSOR) 25 MG tablet, TAKE 1 TABLET BY MOUTH TWICE A DAY, Disp: 180 tablet, Rfl: 3   mirtazapine (REMERON) 7.5 MG tablet, TAKE 1 TABLET BY MOUTH EVERYDAY AT BEDTIME, Disp: 90 tablet, Rfl: 0   Multiple Vitamin (MULTIVITAMIN) tablet, Take 1 tablet by mouth daily., Disp: , Rfl:  Social History   Socioeconomic History   Marital status: Married    Spouse name: [name]   Number of children: 2   Years of education: Not on file   Highest education level: Not on file  Occupational History   Occupation: retired    Comment: Erik Mullins  Tobacco Use   Smoking status: Former    Packs/day: 1.00    Years: 70.00    Pack years: 70.00    Types: Cigarettes    Start date: 09/11/1942    Quit date: 02/17/2020    Years since quitting: 1.6   Smokeless tobacco: Former    Types: Snuff, Chew  Vaping Use   Vaping Use: Never used  Substance and Sexual Activity   Alcohol use: Not Currently    Alcohol/week: 0.0 standard drinks    Comment: 08/22/2013 "drink ~ 1 beer and wine/yr"   Drug use: No   Sexual activity: Never  Other Topics Concern   Not on file  Social History Narrative   From [place]. Relocated to this area from [facility].   Social Determinants of Health   Financial Resource Strain: Low Risk    Difficulty of Paying Living Expenses: Not hard at all  Food Insecurity: No Food Insecurity   Worried About Charity fundraiser in the Last Year: Never true   [place] in the Last Year: Never true  Transportation Needs: No Transportation Needs   Lack of Transportation (Medical): No   Lack of Transportation (Non-Medical): No  Physical Activity: Inactive   Days of Exercise per Week: 0 days   Minutes of Exercise per Session: 0 min  Stress: No Stress Concern Present   Feeling of Stress :  Only a little  Social Connections: Engineer, building services of Communication with Friends and Family: Never   Frequency of Social Gatherings with Friends and Family: More than three times a week   Attends Religious Services: More than 4 times per year   Active Member of [employer] or Organizations: Yes   Attends Archivist Meetings: 1  to 4 times per year   Marital Status: Married  Human resources officer Violence: Not At Risk   Fear of Current or Ex-Partner: No   Emotionally Abused: No   Physically Abused: No   Sexually Abused: No   Family History  Problem Relation Age of Onset   Cancer Mother        uterine deceased age 64   Hypertension Sister    Arrhythmia Other        Atrial fibrillation   Hypertension Maternal Grandmother     Objective: Office vital signs reviewed. BP 125/72    Pulse 82    Temp 97.8 F (36.6 C)    Ht 5\' 10"  (1.778 m)    Wt 177 lb 6.4 oz (80.5 kg)    SpO2 97%    BMI 25.45 kg/m   Physical Examination:  General: Awake, alert, nontoxic elderly male, No acute distress HEENT: Sclera white.  No exophthalmos or goiter Cardio: regular rate and rhythm, S1S2 heard, no murmurs appreciated Pulm: Coarse breath sounds throughout.  Normal work of breathing on room air. MSK: Ambulates with assistance Skin: Right buttock with a stage II pressure sore.  No evidence of secondary infection or complication Neuro: Frequently forgetful.  Does interact with provider  Assessment/ Plan: 88 y.o. male   Stage 3b chronic kidney disease ([place])  Paroxysmal atrial fibrillation ([place])  Hypothyroidism due to acquired atrophy of thyroid  Pure hypercholesterolemia  Vascular dementia without behavioral disturbance ([place])  Pressure injury of right buttock, stage 2 ([place]) - Plan: silver sulfADIAZINE (SILVADENE) 1 % cream  Depression with anxiety - Plan: mirtazapine (REMERON) 15 MG tablet  Labs reviewed.  Renal function does look slightly worse than check 3 months ago.   Encouraged p.o. hydration, avoidance of NSAIDs.  Continue to follow-up with renal as scheduled  Seem to be both rate and rhythm controlled today.  No red flag signs or symptoms from a anticoagulation standpoint  Continue to take current medications as prescribed for thyroid.  Free T4 levels were appropriate.  TSH slightly elevated but given atrial fibrillation I would prefer to have his levels on the higher side  Slight elevation in triglycerides but otherwise cholesterol panel is stable.  No changes  The lesion on the right buttock is consistent with a stage II pressure sore.  Silver Silvadene cream prescribed.  Discussed offsetting the pressure to reduce further breakdown and complication.  Depression and anxiety are not well controlled.  Unsure if his anger outbursts are related to uncontrolled depression or related to underlying dementia.  Increase mirtazapine to 15 mg daily.  We will reassess him in the next couple of months, sooner if concerns arise.  No orders of the defined types were placed in this encounter.  No orders of the defined types were placed in this encounter.    Janora Norlander, DO [place] [phone]

## 2021-10-06 NOTE — Patient Instructions (Signed)
Pressure Injury A pressure injury is damage to the skin and underlying tissue that results from pressure being applied to an area of the body. It often affects people who must spend a long time in a bed or chair because of a medical condition. Pressure injuries usually occur: Over bony parts of the body, such as the tailbone, shoulders, elbows, hips, heels, spine, ankles, and back of the head. Under medical devices that make contact with the body, such as respiratory equipment, stockings, tubes, and splints. Pressure injuries start as reddened areas on the skin and can lead to pain and an open wound. What are the causes? This condition is caused by frequent or constant pressure to an area of the body. Decreased blood flow to the skin can eventually cause the skin tissue to die and break down, causing a wound. What increases the risk? You are more likely to develop this condition if you: Are in the hospital or an extended care facility. Are bedridden or in a wheelchair. Have an injury or disease that keeps you from: Moving normally. Feeling pain or pressure. Have a condition that: Makes you sleepy or less alert. Causes poor blood flow. Need to wear a medical device. Have poor control of your bladder or bowel functions (incontinence). Have poor nutrition (malnutrition). If you are at risk for pressure injuries, your health care provider may recommend certain types of mattresses, mattress covers, pillows, cushions, or boots to help prevent them. These may include products filled with air, foam, gel, or sand. What are the signs or symptoms? Symptoms of this condition depend on the severity of the injury. Symptoms may include: Red or dark areas of the skin. Pain, warmth, or a change of skin texture. Blisters. An open wound. How is this diagnosed? This condition is diagnosed with a medical history and physical exam. You may also have tests, such as: Blood tests. Imaging tests. Blood flow  tests. Your pressure injury will be staged based on its severity. Staging is based on: The depth of the tissue injury, including whether there is exposure of muscle, bone, or tendon. The cause of the pressure injury. How is this treated? This condition may be treated by: Relieving or redistributing pressure on your skin. This includes: Frequently changing your position. Avoiding positions that caused the wound or that can make the wound worse. Using specific bed mattresses, chair cushions, or protective boots. Moving medical devices from an area of pressure, or placing padding between the skin and the device. Using foams, creams, or powders to prevent rubbing (friction) on the skin. Keeping your skin clean and dry. This may include using a skin cleanser or skin barrier as told by your health care provider. Cleaning your injury and removing any dead tissue from the wound (debridement). Placing a bandage (dressing) over your injury. Using medicines for pain or to prevent or treat infection. Surgery may be needed if other treatments are not working or if your injury is very deep. Follow these instructions at home: Wound care Follow instructions from your health care provider about how to take care of your wound. Make sure you: Wash your hands with soap and water before and after you change your bandage (dressing). If soap and water are not available, use hand sanitizer. Change your dressing as told by your health care provider.  Check your wound every day for signs of infection. Have a caregiver do this for you if you are not able. Check for: Redness, swelling, or increased pain. More fluid  or blood. Warmth. Pus or a bad smell. Skin care Keep your skin clean and dry. Gently pat your skin dry. Do not rub or massage your skin. You or a caregiver should check your skin every day for any changes in color or any new blisters or sores (ulcers). Medicines Take over-the-counter and prescription  medicines only as told by your health care provider. If you were prescribed an antibiotic medicine, take or apply it as told by your health care provider. Do not stop using the antibiotic even if your condition improves. Reducing and redistributing pressure Do not lie or sit in one position for a long time. Move or change position every 1-2 hours, or as told by your health care provider. Use pillows or cushions to reduce pressure. Ask your health care provider to recommend cushions or pads for you. General instructions  Eat a healthy diet that includes lots of protein. Drink enough fluid to keep your urine pale yellow. Be as active as you can every day. Ask your health care provider to suggest safe exercises or activities. Do not abuse drugs or alcohol. Do not use any products that contain nicotine or tobacco, such as cigarettes, e-cigarettes, and chewing tobacco. If you need help quitting, ask your health care provider. Keep all follow-up visits as told by your health care provider. This is important. Contact a health care provider if: You have: A fever or chills. Pain that is not helped by medicine. Any changes in skin color. New blisters or sores. Pus or a bad smell coming from your wound. Redness, swelling, or pain around your wound. More fluid or blood coming from your wound. Your wound does not improve after 1-2 weeks of treatment. Summary A pressure injury is damage to the skin and underlying tissue that results from pressure being applied to an area of the body. Do not lie or sit in one position for a long time. Your health care provider may advise you to move or change position every 1-2 hours. Follow instructions from your health care provider about how to take care of your wound. Keep all follow-up visits as told by your health care provider. This is important. This information is not intended to replace advice given to you by your health care provider. Make sure you discuss  any questions you have with your health care provider. Document Revised: 03/30/2018 Document Reviewed: 03/30/2018 Elsevier Patient Education  [place].

## 2021-10-07 NOTE — Telephone Encounter (Signed)
-----   Message from Janora Norlander, DO sent at 10/06/2021  5:27 PM EST ----- Please fax lab results and today's note to Dr. Domenica Fail at the [place] at [phone]

## 2021-10-07 NOTE — Telephone Encounter (Signed)
Ppw faxed

## 2021-10-09 ENCOUNTER — Encounter: Payer: Self-pay | Admitting: Urology

## 2021-10-09 ENCOUNTER — Other Ambulatory Visit: Payer: Self-pay

## 2021-10-09 ENCOUNTER — Ambulatory Visit (INDEPENDENT_AMBULATORY_CARE_PROVIDER_SITE_OTHER): Payer: Medicare Other | Admitting: Urology

## 2021-10-09 VITALS — BP 145/93 | HR 137 | Wt 177.0 lb

## 2021-10-09 DIAGNOSIS — R9721 Rising PSA following treatment for malignant neoplasm of prostate: Secondary | ICD-10-CM | POA: Diagnosis not present

## 2021-10-09 DIAGNOSIS — R350 Frequency of micturition: Secondary | ICD-10-CM | POA: Diagnosis not present

## 2021-10-09 DIAGNOSIS — C61 Malignant neoplasm of prostate: Secondary | ICD-10-CM

## 2021-10-09 LAB — URINALYSIS, ROUTINE W REFLEX MICROSCOPIC
Bilirubin, UA: NEGATIVE
Glucose, UA: NEGATIVE
Ketones, UA: NEGATIVE
Leukocytes,UA: NEGATIVE
Nitrite, UA: NEGATIVE
Protein,UA: NEGATIVE
RBC, UA: NEGATIVE
Specific Gravity, UA: 1.015 (ref 1.005–1.030)
Urobilinogen, Ur: 0.2 mg/dL (ref 0.2–1.0)
pH, UA: 5.5 (ref 5.0–7.5)

## 2021-10-09 MED ORDER — LEUPROLIDE ACETATE (6 MONTH) 45 MG ~~LOC~~ KIT
45.0000 mg | PACK | Freq: Once | SUBCUTANEOUS | Status: AC
  Administered 2021-10-09: 45 mg via SUBCUTANEOUS

## 2021-10-09 MED ORDER — LEUPROLIDE ACETATE (6 MONTH) 45 MG IM KIT: 45.0000 mg | PACK | Freq: Once | INTRAMUSCULAR | Status: DC

## 2021-10-09 NOTE — Progress Notes (Signed)
Eligard SubQ Injection   Due to Prostate Cancer patient is present today for a Eligard Injection.  Medication: Eligard 6 month Dose: 45 mg  Location: left  Lot: [id number] Exp: [id number]  Patient tolerated well, no complications were noted  Performed by: Estill Bamberg RN

## 2021-10-09 NOTE — Progress Notes (Signed)
Subjective:  1. Prostate cancer ([place])   2. Rising PSA following treatment for malignant neoplasm of prostate   3. Urinary frequency    10/09/21: Dickie returns today in f/u.  He has NM CRCP.  His last Lupron was on 04/10/21.  His PSA continues to rise slowly and was 5.6 on 10/03/21 which was up from 4.4 on 07/04/21 and 3.2 on 04/02/21.  He also has lung CA treated with SBRT in 11/21 and CKD 3b with a Cr of 2.2 on 10/04/21 which is in his usual range.  He had a PMSA PET scan in 8/22 and had only evidence of local recurrence in the right prostate. He is voiding with some frequency but he sits to void.  His IPSS is 10.  He has no bone pain but he has arthralgias.  He has stable weight.  He has some fecal urgency but no other GI complaints.   04/10/21: Epimenio returns today in f/u for his history of NM CRCP.  His last Lupron was on 10/03/20.   His PSA  is back up to 3.2 from 2.26 on 02/11/21 and 1.7 on 01/01/21.  The testosterone level remains low at <3.     He had a Chest CT on 02/11/21 that was stable.   His UA is clear today.   He is voiding well but has some frequency with a diuretic.  He has nocturia x 1.   He has had no hematuria.  He has had no weight loss.  He has no bone pain.   He has some chest discomfort that is felt to be from the lung radiation therapy.    08/23/20: Marguis returns today in f/u.  He was found to have lung cancer at his last visit and has subsequently been treated with radiation therapy.    His PSA continues to rise and was 2.0 on 08/20/20 which is up from 1.5 in 7/21 and 0.8 in 12/20.  He T was castrate in 04/02/20.  He is voiding well but has stable nocturia 2-3x.  He has no bone pain or weight loss.    05/24/20: Mr. Cohick returns today in f/u from an [place] PET done for his history of prostate cancer that was diagnosed in 4/10 and he completed radiation therapy on 08/16/09.  He had gleason 9 disease intiallly.  He had a PSA recurrence and was started on Lupron in 2017 after his PSA rose to 9.9  in 6/17 from 3.6 in 12/16.  A bone scan and CT in 7/17 showed no mets.   His PSA nadired at 0.2 on Lupron in 9/18 but has been rising and was 0.4 in 3/20, 0.8 in 12/20 and 1.5 on 04/02/20.  His testosterone has remained castrate.  His last Lupron injection was on 04/05/20.   He is doing well without other associated signs or symptoms.  He is voiding well with an IPSS of 3-4 with nocturia x 2-3.  He drinks a lot of coffee prior to bed.  He has no bone pain or weight loss.   He has no hematuria.     The PET scan showed a 1cm nodule in the left upper lobe that was hypermetabolic and could be prostatic or a bronchogenic carcinoma.  Tissue sampling was recommended.  There was some diffuse uptake in the prostate as well.     IPSS     Row Name 10/09/21 1500         International Prostate Symptom Score   How often  have you had the sensation of not emptying your bladder? Not at All     How often have you had to urinate less than every two hours? Less than half the time     How often have you found you stopped and started again several times when you urinated? Less than half the time     How often have you found it difficult to postpone urination? Less than half the time     How often have you had a weak urinary stream? About half the time     How often have you had to strain to start urination? Not at All     How many times did you typically get up at night to urinate? 1 Time     Total IPSS Score 10       Quality of Life due to urinary symptoms   If you were to spend the rest of your life with your urinary condition just the way it is now how would you feel about that? Mostly Satisfied               ROS:  ROS  Allergies  Allergen Reactions   Other Other (See Comments)   Morphine Other (See Comments)    hallucinate    Outpatient Encounter Medications as of 10/09/2021  Medication Sig   acetaminophen (TYLENOL) 500 MG tablet Take 1,000 mg by mouth every 6 (six) hours as needed for headache.    albuterol (VENTOLIN HFA) 108 (90 Base) MCG/ACT inhaler Inhale 1-2 puffs into the lungs every 6 (six) hours as needed for wheezing or shortness of breath. (Patient taking differently: Inhale 2 puffs into the lungs every 6 (six) hours as needed for wheezing or shortness of breath.)   allopurinol (ZYLOPRIM) 100 MG tablet TAKE 1 TABLET BY MOUTH EVERY DAY   ANORO ELLIPTA 62.5-25 MCG/ACT AEPB INHALE 1 PUFF BY MOUTH EVERY DAY   apixaban (ELIQUIS) 5 MG TABS tablet Take 2.5 mg by mouth 2 (two) times daily.   betamethasone valerate ointment (VALISONE) 0.1 % APPLY TOPICALLY 2TIMES DAILY. AVOID FACE, AXILLA AND GROIN. (USE AS NEEDED FOR PSORIASIS FLARES)   Calcium Carbonate-Vitamin D 600-400 MG-UNIT tablet Take 1 tablet by mouth daily.   Carboxymethylcellulose Sodium (ARTIFICIAL TEARS OP) Place 1 drop into both eyes daily as needed (dry eyes).   docusate sodium (COLACE) 250 MG capsule Take 250 mg by mouth at bedtime.   furosemide (LASIX) 20 MG tablet TAKE 1 TABLET BY MOUTH EVERY DAY   levothyroxine (SYNTHROID) 25 MCG tablet Take 1 tablet (25 mcg total) by mouth daily.   loperamide (IMODIUM) 2 MG capsule Take 1 capsule (2 mg total) by mouth 4 (four) times daily as needed for diarrhea or loose stools.   memantine (NAMENDA) 5 MG tablet Take 1 tablet (5 mg total) by mouth 2 (two) times daily. (Patient taking differently: Take 2.5 mg by mouth 2 (two) times daily.)   metoprolol tartrate (LOPRESSOR) 25 MG tablet TAKE 1 TABLET BY MOUTH TWICE A DAY   mirtazapine (REMERON) 15 MG tablet Take 1 tablet (15 mg total) by mouth at bedtime.   Multiple Vitamin (MULTIVITAMIN) tablet Take 1 tablet by mouth daily.   silver sulfADIAZINE (SILVADENE) 1 % cream Apply 1 application topically daily.   [DISCONTINUED] mirtazapine (REMERON) 7.5 MG tablet TAKE 1 TABLET BY MOUTH EVERYDAY AT BEDTIME   [EXPIRED] leuprolide (6 Month) (ELIGARD) injection 45 mg    [DISCONTINUED] Leuprolide Acetate (6 Month) (LUPRON) injection 45 mg  No  facility-administered encounter medications on file as of 10/09/2021.    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 Gery Pray   History of stroke    Hyperlipidemia    PAD (peripheral artery disease) ([place])    Stenting in lower extremities (no records)   Prostate cancer ([place])    XRT; in remission   Type 2 diabetes mellitus ([place])     Past Surgical History:  Procedure Laterality Date   ABDOMINAL AORTIC ANEURYSM REPAIR  1980's   in [place] Right ~ [place], BILATERAL Bilateral 2014   CHOLECYSTECTOMY     COLON SURGERY     PARTIAL COLECTOMY     "I was bleeding to death inside; took out 2/3 of my colon" (08/22/2013)    Social History   Socioeconomic History   Marital status: Married    Spouse name: [name]   Number of children: 2   Years of education: Not on file   Highest education level: Not on file  Occupational History   Occupation: retired    Comment: [employer]  Tobacco Use   Smoking status: Former    Packs/day: 1.00    Years: 70.00    Pack years: 70.00    Types: Cigarettes    Start date: 09/11/1944    Quit date: 02/17/2020    Years since quitting: 1.6   Smokeless tobacco: Former    Types: Snuff, Chew  Vaping Use   Vaping Use: Never used  Substance and Sexual Activity   Alcohol use: Not Currently    Alcohol/week: 0.0 standard drinks    Comment: 08/22/2013 "drink ~ 1 beer and wine/yr"   Drug use:  No   Sexual activity: Never  Other Topics Concern   Not on file  Social History Narrative   From [place]. Relocated to this area from [facility].   Social Determinants of Health   Financial Resource Strain: Low Risk    Difficulty of Paying Living Expenses: Not hard at all  Food Insecurity: No Food Insecurity   Worried About Charity fundraiser in the Last Year: Never true   [place] in the Last Year: Never true  Transportation Needs: No Transportation Needs   Lack of Transportation (Medical): No   Lack of Transportation (Non-Medical): No  Physical Activity: Inactive   Days of Exercise per Week: 0 days   Minutes of Exercise per Session: 0 min  Stress: No Stress Concern Present   Feeling of Stress : Only a little  Social Connections: Engineer, building services of Communication with Friends and Family: Never   Frequency of Social Gatherings with Friends and Family: More than three times a week   Attends Religious Services: More than 4 times per year   Active  Member of Clubs or Organizations: Yes   Attends Music therapist: 1 to 4 times per year   Marital Status: Married  Human resources officer Violence: Not At Risk   Fear of Current or Ex-Partner: No   Emotionally Abused: No   Physically Abused: No   Sexually Abused: No    Family History  Problem Relation Age of Onset   Cancer Mother        uterine deceased age 38   Hypertension Sister    Arrhythmia Other        Atrial fibrillation   Hypertension Maternal Grandmother        Objective: Vitals:   10/09/21 1515  BP: (!) 145/93  Pulse: (!) 137  I checked his pulse manually and he has an IRR with some quick runs but the rate is variable.    Physical Exam Vitals reviewed.  Constitutional:      Appearance: Normal appearance.  Lymphadenopathy:     Cervical: No cervical adenopathy.     Upper Body:     Right upper body: No supraclavicular or axillary adenopathy.     Left upper body: No  supraclavicular or axillary adenopathy.  Neurological:     Mental Status: He is alert.    Lab Results:  No results for input(s): HGB, HCT, PLT in the last 72 hours.  Invalid input(s):  WBC BMET No results for input(s): NA, K, CL, CO2, GLUCOSE, BUN, CREATININE, CALCIUM in the last 72 hours. PSA 1.5 04/02/20  0.8 12/20 0.4 in 3/20.    Lab Results  Component Value Date   PSA1 5.6 (H) 10/03/2021   PSA1 4.4 (H) 07/04/2021   PSA1 3.2 04/02/2021    Results for orders placed or performed in visit on 10/09/21 (from the past 24 hour(s))  Urinalysis, Routine w reflex microscopic     Status: None   Collection Time: 10/09/21  3:48 PM  Result Value Ref Range   Specific Gravity, UA 1.015 1.005 - 1.030   pH, UA 5.5 5.0 - 7.5   Color, UA Yellow Yellow   Appearance Ur Clear Clear   Leukocytes,UA Negative Negative   Protein,UA Negative Negative/Trace   Glucose, UA Negative Negative   Ketones, UA Negative Negative   RBC, UA Negative Negative   Bilirubin, UA Negative Negative   Urobilinogen, Ur 0.2 0.2 - 1.0 mg/dL   Nitrite, UA Negative Negative   Microscopic Examination Comment    Narrative   Performed at:  [facility] [address] [place], [place]  [id number] Lab Director: Mina Marble MT, Phone:  [phone]   UA is clear.  Results for orders placed or performed in visit on 10/09/21 (from the past 24 hour(s))  Urinalysis, Routine w reflex microscopic     Status: None   Collection Time: 10/09/21  3:48 PM  Result Value Ref Range   Specific Gravity, UA 1.015 1.005 - 1.030   pH, UA 5.5 5.0 - 7.5   Color, UA Yellow Yellow   Appearance Ur Clear Clear   Leukocytes,UA Negative Negative   Protein,UA Negative Negative/Trace   Glucose, UA Negative Negative   Ketones, UA Negative Negative   RBC, UA Negative Negative   Bilirubin, UA Negative Negative   Urobilinogen, Ur 0.2 0.2 - 1.0 mg/dL   Nitrite, UA Negative Negative   Microscopic Examination Comment    Narrative    Performed at:  [facility] [address], [place], [place]  [id number] Lab Director: Mina Marble MT,  Phone:  [phone]     Studies/Results:       Assessment & Plan: 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  PMSA PET in 8/22 just showed local recurrence.  His PSADT is about 6 months but with the local only recurrence in association with his age and comorbidities, I think he will be best served at this time with just the Lupron which was given today.   He will return in 3 months for a PSA and then an OV and PSA in 6 months for his next Lupron.    BOO with frequency.   He has mild/mod LUTS and is tolerating them well.       Orders Placed This Encounter  Procedures   Urinalysis, Routine w reflex microscopic   PSA    Standing Status:   Future    Standing Expiration Date:   04/08/2022   PSA    Standing Status:   Future    Standing Expiration Date:   10/09/2022      Return in about 6 months (around 04/08/2022) for with PSA prior to visit.  Lupron as well at the visit.  Also I ordered a 3 month PSA. Marland Kitchen     CC: Janora Norlander, DO and Dr. Derek Jack.     Gunther Zawadzki 10/09/2021 Patient ID: Wiliam Ke, male   DOB: [birth year], 88 y.o.   MRN: [id number] Patient ID: ARCHIMEDES HAROLD, male   DOB: Jan 18, 1931, 88 y.o.   MRN: [id number] Patient ID: CEDARIUS KERSH, male   DOB: 12/05/1930, 88 y.o.   MRN: [id number]

## 2021-10-09 NOTE — Progress Notes (Signed)
Urological Symptom Review  Patient is experiencing the following symptoms: Frequent urination Stream starts and stops   Review of Systems  Gastrointestinal (upper)  : Negative for upper GI symptoms  Gastrointestinal (lower) : Negative for lower GI symptoms  Constitutional : Fatigue  Skin: Negative for skin symptoms  Eyes: Negative for eye symptoms  Ear/Nose/Throat : Negative for Ear/Nose/Throat symptoms  Hematologic/Lymphatic: Negative for Hematologic/Lymphatic symptoms  Cardiovascular : Negative for cardiovascular symptoms  Respiratory : Negative for respiratory symptoms  Endocrine: Negative for endocrine symptoms  Musculoskeletal: Negative for musculoskeletal symptoms  Neurological: Negative for neurological symptoms  Psychologic: Negative for psychiatric symptoms

## 2021-10-12 ENCOUNTER — Other Ambulatory Visit: Payer: Self-pay | Admitting: Family Medicine

## 2021-10-12 DIAGNOSIS — J449 Chronic obstructive pulmonary disease, unspecified: Secondary | ICD-10-CM

## 2021-10-14 ENCOUNTER — Other Ambulatory Visit: Payer: Self-pay | Admitting: Family Medicine

## 2021-10-14 DIAGNOSIS — N183 Chronic kidney disease, stage 3 unspecified: Secondary | ICD-10-CM

## 2021-11-01 ENCOUNTER — Other Ambulatory Visit: Payer: Self-pay | Admitting: Family Medicine

## 2021-11-01 DIAGNOSIS — L409 Psoriasis, unspecified: Secondary | ICD-10-CM

## 2021-11-03 IMAGING — CT NM PET TUM IMG SKULL BASE T - THIGH
1 of 7 series · 1 of 25 positions shown · non-contrast
Comparison: FDG PET scan 05/08/2020., Axumin PET scan 05/14/2020

CLINICAL DATA: Prostate carcinoma with biochemical recurrence.

EXAM:
NUCLEAR MEDICINE PET SKULL BASE TO THIGH
TECHNIQUE: 8.9 mCi F18 Piflufolastat (Pylarify) was injected intravenously.
Full-ring PET imaging was performed from the skull base to thigh
after the radiotracer. CT data was obtained and used for attenuation
correction and anatomic localization.

[Series 4: ct sk_thigh 5.0 bf37 · axial · 5.0mm · 0.98mm/px · 1 of 256 slices shown]
[im 192/256  brain]
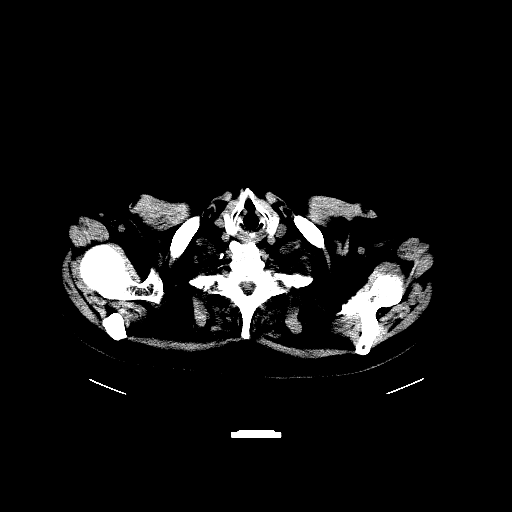

[1 of 25 positions shown; findings below may reference images not displayed]

FINDINGS: NECK

No radiotracer activity in neck lymph nodes.

Incidental CT finding: None

CHEST

Mild nonspecific radiotracer activity within treated site within the
LEFT lung with SUV max equal 2.7.

No evidence of prostate cancer metastasis in the thorax.

Incidental CT finding: None

ABDOMEN/PELVIS

Prostate: There is focal activity within the most inferior RIGHT
aspect of the prostate gland with SUV max equal 15.6. (Image 212)

Lymph nodes: No abnormal radiotracer accumulation within pelvic or
abdominal nodes.

Liver: No evidence of liver metastasis

Incidental CT finding: Marked cystic change within the LEFT kidney
with scant remaining normal renal parenchyma. Post aortic graft
repair.

SKELETON

No focal  activity to suggest skeletal metastasis.
IMPRESSION: 1. Focal activity in the most inferior RIGHT aspect of the prostate
gland concerning for local prostate cancer recurrence.
2. No evidence of metastatic prostate cancer adenopathy in the
pelvis or periaortic retroperitoneum.
3. No evidence of visceral metastasis or skeletal metastasis.

## 2021-11-03 NOTE — Telephone Encounter (Signed)
Last office visit 10/06/21 Last refill 03/19/21, 90 grams, 1 refill

## 2021-11-06 ENCOUNTER — Ambulatory Visit (HOSPITAL_COMMUNITY)
Admission: RE | Admit: 2021-11-06 | Discharge: 2021-11-06 | Disposition: A | Discharge status: Home / Self Care | Payer: Medicare Other | Source: Ambulatory Visit | Attending: Hematology and Oncology | Admitting: Hematology and Oncology

## 2021-11-06 ENCOUNTER — Other Ambulatory Visit: Payer: Self-pay

## 2021-11-06 DIAGNOSIS — C3492 Malignant neoplasm of unspecified part of left bronchus or lung: Secondary | ICD-10-CM | POA: Insufficient documentation

## 2021-11-06 DIAGNOSIS — J479 Bronchiectasis, uncomplicated: Secondary | ICD-10-CM | POA: Diagnosis not present

## 2021-11-06 DIAGNOSIS — I700 Atherosclerosis of aorta: Secondary | ICD-10-CM | POA: Diagnosis not present

## 2021-11-06 DIAGNOSIS — J439 Emphysema, unspecified: Secondary | ICD-10-CM | POA: Diagnosis not present

## 2021-11-06 DIAGNOSIS — R911 Solitary pulmonary nodule: Secondary | ICD-10-CM | POA: Diagnosis not present

## 2021-11-07 ENCOUNTER — Inpatient Hospital Stay (HOSPITAL_COMMUNITY): Payer: Medicare Other | Attending: Hematology

## 2021-11-07 ENCOUNTER — Ambulatory Visit (HOSPITAL_COMMUNITY): Admission: RE | Admit: 2021-11-07 | Payer: Medicare Other | Source: Ambulatory Visit

## 2021-11-07 DIAGNOSIS — Z8546 Personal history of malignant neoplasm of prostate: Secondary | ICD-10-CM

## 2021-11-10 ENCOUNTER — Other Ambulatory Visit: Payer: Self-pay | Admitting: Family Medicine

## 2021-11-10 DIAGNOSIS — F418 Other specified anxiety disorders: Secondary | ICD-10-CM

## 2021-11-13 ENCOUNTER — Ambulatory Visit (HOSPITAL_COMMUNITY): Payer: Medicare Other | Admitting: Hematology

## 2021-11-21 ENCOUNTER — Telehealth: Payer: Self-pay

## 2021-11-21 NOTE — Chronic Care Management (AMB) (Signed)
?  Chronic Care Management  ? ?Note ? ?11/21/2021 ?Name: Erik Mullins MRN: [id number] DOB: 11-23-1930 ? ?Erik Mullins is a 88 y.o. year old male who is a primary care patient of Janora Norlander, DO. Erik Mullins is currently enrolled in care management services. An additional referral for RN CM  was placed.  ? ?Follow up plan: ?Patient declines further follow up and engagement by the care management team. Appropriate care team members and provider have been notified via electronic communication.  ? ?Noreene Larsson, RMA ?Care Guide, Embedded Care Coordination ?[facility]  ?[place], [place] [address] ?Direct Dial: [phone] ?[email or web address] ?Website: [email or web address]  ? ?

## 2021-12-01 ENCOUNTER — Other Ambulatory Visit (HOSPITAL_COMMUNITY): Payer: Self-pay | Admitting: *Deleted

## 2021-12-01 DIAGNOSIS — C3492 Malignant neoplasm of unspecified part of left bronchus or lung: Secondary | ICD-10-CM

## 2021-12-01 DIAGNOSIS — Z8546 Personal history of malignant neoplasm of prostate: Secondary | ICD-10-CM

## 2021-12-05 ENCOUNTER — Other Ambulatory Visit: Payer: Medicare Other

## 2021-12-05 DIAGNOSIS — C3492 Malignant neoplasm of unspecified part of left bronchus or lung: Secondary | ICD-10-CM

## 2021-12-05 DIAGNOSIS — Z8546 Personal history of malignant neoplasm of prostate: Secondary | ICD-10-CM

## 2021-12-05 LAB — CBC WITH DIFFERENTIAL/PLATELET
Basophils Absolute: 0.1 10*3/uL (ref 0.0–0.2)
Basos: 1 %
EOS (ABSOLUTE): 0.2 10*3/uL (ref 0.0–0.4)
Eos: 2 %
Hematocrit: 47.9 % (ref 37.5–51.0)
Hemoglobin: 16.3 g/dL (ref 13.0–17.7)
Immature Grans (Abs): 0 10*3/uL (ref 0.0–0.1)
Immature Granulocytes: 1 %
Lymphocytes Absolute: 1.6 10*3/uL (ref 0.7–3.1)
Lymphs: 25 %
MCH: 29.8 pg (ref 26.6–33.0)
MCHC: 34 g/dL (ref 31.5–35.7)
MCV: 88 fL (ref 79–97)
Monocytes Absolute: 0.5 10*3/uL (ref 0.1–0.9)
Monocytes: 7 %
Neutrophils Absolute: 4.2 10*3/uL (ref 1.4–7.0)
Neutrophils: 64 %
Platelets: 166 10*3/uL (ref 150–450)
RBC: 5.47 x10E6/uL (ref 4.14–5.80)
RDW: 14 % (ref 11.6–15.4)
WBC: 6.5 10*3/uL (ref 3.4–10.8)

## 2021-12-06 LAB — COMPREHENSIVE METABOLIC PANEL
ALT: 10 IU/L (ref 0–44)
AST: 16 IU/L (ref 0–40)
Albumin/Globulin Ratio: 1.5 (ref 1.2–2.2)
Albumin: 4.3 g/dL (ref 3.6–4.6)
Alkaline Phosphatase: 130 IU/L — ABNORMAL HIGH (ref 44–121)
BUN/Creatinine Ratio: 17 (ref 10–24)
BUN: 32 mg/dL — ABNORMAL HIGH (ref 8–27)
Bilirubin Total: 0.3 mg/dL (ref 0.0–1.2)
CO2: 29 mmol/L (ref 20–29)
Calcium: 9.6 mg/dL (ref 8.6–10.2)
Chloride: 100 mmol/L (ref 96–106)
Creatinine, Ser: 1.88 mg/dL — ABNORMAL HIGH (ref 0.76–1.27)
Globulin, Total: 2.9 g/dL (ref 1.5–4.5)
Glucose: 93 mg/dL (ref 70–99)
Potassium: 4.5 mmol/L (ref 3.5–5.2)
Sodium: 143 mmol/L (ref 134–144)
Total Protein: 7.2 g/dL (ref 6.0–8.5)
eGFR: 34 mL/min/{1.73_m2} — ABNORMAL LOW (ref >59)

## 2021-12-06 LAB — PSA: Prostate Specific Ag, Serum: 6.2 ng/mL — ABNORMAL HIGH (ref 0.0–4.0)

## 2021-12-09 ENCOUNTER — Other Ambulatory Visit: Payer: Self-pay

## 2021-12-09 ENCOUNTER — Inpatient Hospital Stay (HOSPITAL_COMMUNITY): Payer: Medicare Other | Attending: Hematology | Admitting: Hematology

## 2021-12-09 VITALS — BP 125/65 | HR 80 | Temp 98.0°F | Resp 18 | Ht 70.0 in | Wt 174.8 lb

## 2021-12-09 DIAGNOSIS — Z87891 Personal history of nicotine dependence: Secondary | ICD-10-CM | POA: Diagnosis not present

## 2021-12-09 DIAGNOSIS — Z8546 Personal history of malignant neoplasm of prostate: Secondary | ICD-10-CM

## 2021-12-09 DIAGNOSIS — C3412 Malignant neoplasm of upper lobe, left bronchus or lung: Secondary | ICD-10-CM | POA: Insufficient documentation

## 2021-12-09 DIAGNOSIS — Z993 Dependence on wheelchair: Secondary | ICD-10-CM | POA: Insufficient documentation

## 2021-12-09 DIAGNOSIS — C3492 Malignant neoplasm of unspecified part of left bronchus or lung: Secondary | ICD-10-CM | POA: Diagnosis not present

## 2021-12-09 DIAGNOSIS — F0390 Unspecified dementia without behavioral disturbance: Secondary | ICD-10-CM | POA: Insufficient documentation

## 2021-12-09 DIAGNOSIS — C61 Malignant neoplasm of prostate: Secondary | ICD-10-CM | POA: Insufficient documentation

## 2021-12-09 NOTE — Progress Notes (Signed)
? ?[facility] ?[address] ?[place], [place] [address] ? ? ?CLINIC:  ?Medical Oncology/Hematology ? ?PCP:  ?Janora Norlander, DO ?[address] [place] [address] ?[phone] ? ? ?REASON FOR VISIT:  ?Follow-up for left squamous cell lung cancer ? ?PRIOR THERAPY: SBRT to left upper lung 54 Gy in 3 fractions through 08/06/2020 ? ?NGS Results: not done ? ?CURRENT THERAPY: surveillance ? ?BRIEF ONCOLOGIC HISTORY:  ?Oncology History  ? No history exists.  ? ? ?CANCER STAGING: ? Cancer Staging  ?No matching staging information was found for the patient. ? ?INTERVAL HISTORY:  ?Erik Mullins, a 88 y.o. male, returns for routine follow-up of his left squamous cell lung cancer. Erik Mullins was last seen on 05/07/2021.  ? ?Today he reports feeling well, and he is accompanied by his wife. His wife reports dementia has worsened; his memory has decreased, and he has occasional visual hallucinations. He occasionally forgets the names of family members. Recently his brother and his dog passed away. His wife reports his appetite is fair.  ? ?REVIEW OF SYSTEMS:  ?Review of Systems  ?Constitutional:  Positive for fatigue. Negative for appetite change.  ?HENT:   Positive for trouble swallowing.   ?Respiratory:  Positive for shortness of breath.   ?Gastrointestinal:  Positive for constipation.  ?Musculoskeletal:  Positive for back pain (4/10).  ?Neurological:  Positive for dizziness.  ?Psychiatric/Behavioral:  Positive for confusion and decreased concentration (memory loss).   ?All other systems reviewed and are negative. ? ?PAST MEDICAL/SURGICAL HISTORY:  ?Past Medical History:  ?Diagnosis Date  ? AAA (abdominal aortic aneurysm)   ? Remote ~1994   ? Anemia   ? Arthritis   ? Atrial fibrillation ([facility])   ? Diagnosed December 2014  ? AVNRT (AV nodal re-entry tachycardia) ([facility])   ? Possible  ? Cataract   ? Bilateral  ? Cirrhosis ([facility])   ? CKD (chronic kidney disease) stage 3, GFR 30-59 ml/min (HCC)   ? Essential  hypertension   ? Gout   ? History of GI bleed   ? History of gout   ? History of hepatitis   ? History of radiation therapy 07/18/2020-08/06/2020  ? L Lung SBRT; Dr. Gery Pray  ? History of stroke   ? Hyperlipidemia   ? PAD (peripheral artery disease) ([facility])   ? Stenting in lower extremities (no records)  ? Prostate cancer ([facility])   ? XRT; in remission  ? Type 2 diabetes mellitus ([facility])   ? ?Past Surgical History:  ?Procedure Laterality Date  ? ABDOMINAL AORTIC ANEURYSM REPAIR  1980's  ? in [place]  ? APPENDECTOMY    ? CAROTID ENDARTERECTOMY Right ~ 1999  ? CATARACT EXTRACTION W/ INTRAOCULAR LENS  IMPLANT, BILATERAL Bilateral 2014  ? CHOLECYSTECTOMY    ? COLON SURGERY    ? PARTIAL COLECTOMY    ? "I was bleeding to death inside; took out 2/3 of my colon" (08/22/2013)  ? ? ?SOCIAL HISTORY:  ?Social History  ? ?Socioeconomic History  ? Marital status: Married  ?  Spouse name: [name]  ? Number of children: 2  ? Years of education: Not on file  ? Highest education level: Not on file  ?Occupational History  ? Occupation: retired  ?  Comment: [employer]  ?Tobacco Use  ? Smoking status: Former  ?  Packs/day: 1.00  ?  Years: 70.00  ?  Pack years: 70.00  ?  Types: Cigarettes  ?  Start date: 09/11/1944  ?  Quit date: 02/17/2020  ?  Years since quitting: 1.8  ? Smokeless tobacco: Former  ?  Types: Snuff, Chew  ?Vaping Use  ? Vaping Use: Never used  ?Substance and Sexual Activity  ? Alcohol use: Not Currently  ?  Alcohol/week: 0.0 standard drinks  ?  Comment: 08/22/2013 "drink ~ 1 beer and wine/yr"  ? Drug use: No  ? Sexual activity: Never  ?Other Topics Concern  ? Not on file  ?Social History Narrative  ? From [place]. Relocated to this area from [facility].  ? ?Social Determinants of Health  ? ?Financial Resource Strain: Low Risk   ? Difficulty of Paying Living Expenses: Not hard at all  ?Food Insecurity: No Food Insecurity  ? Worried About Charity fundraiser in the Last Year: Never true  ? Ran Out of Food in the  Last Year: Never true  ?Transportation Needs: No Transportation Needs  ? Lack of Transportation (Medical): No  ? Lack of Transportation (Non-Medical): No  ?Physical Activity: Inactive  ? Days of Exercise per Week: 0 days  ? Minutes of Exercise per Session: 0 min  ?Stress: No Stress Concern Present  ? Feeling of Stress : Only a little  ?Social Connections: Socially Integrated  ? Frequency of Communication with Friends and Family: Never  ? Frequency of Social Gatherings with Friends and Family: More than three times a week  ? Attends Religious Services: More than 4 times per year  ? Active Member of Clubs or Organizations: Yes  ? Attends Archivist Meetings: 1 to 4 times per year  ? Marital Status: Married  ?Intimate Partner Violence: Not At Risk  ? Fear of Current or Ex-Partner: No  ? Emotionally Abused: No  ? Physically Abused: No  ? Sexually Abused: No  ? ? ?FAMILY HISTORY:  ?Family History  ?Problem Relation Age of Onset  ? Cancer Mother   ?     uterine deceased age 41  ? Hypertension Sister   ? Arrhythmia Other   ?     Atrial fibrillation  ? Hypertension Maternal Grandmother   ? ? ?CURRENT MEDICATIONS:  ?Current Outpatient Medications  ?Medication Sig Dispense Refill  ? acetaminophen (TYLENOL) 500 MG tablet Take 1,000 mg by mouth every 6 (six) hours as needed for headache.    ? albuterol (VENTOLIN HFA) 108 (90 Base) MCG/ACT inhaler Inhale 1-2 puffs into the lungs every 6 (six) hours as needed for wheezing or shortness of breath. (Patient taking differently: Inhale 2 puffs into the lungs every 6 (six) hours as needed for wheezing or shortness of breath.) 18 g 2  ? allopurinol (ZYLOPRIM) 100 MG tablet TAKE 1 TABLET BY MOUTH EVERY DAY 90 tablet 0  ? ANORO ELLIPTA 62.5-25 MCG/ACT AEPB INHALE 1 PUFF BY MOUTH EVERY DAY 60 each 2  ? apixaban (ELIQUIS) 5 MG TABS tablet Take 2.5 mg by mouth 2 (two) times daily.    ? betamethasone valerate ointment (VALISONE) 0.1 % APPLY TOPICALLY 2TIMES DAILY. AVOID FACE, AXILLA  AND GROIN. (USE AS NEEDED FOR PSORIASIS FLARES) 90 g 1  ? Calcium Carbonate-Vitamin D 600-400 MG-UNIT tablet Take 1 tablet by mouth daily.    ? Carboxymethylcellulose Sodium (ARTIFICIAL TEARS OP) Place 1 drop into both eyes daily as needed (dry eyes).    ? docusate sodium (COLACE) 250 MG capsule Take 250 mg by mouth at bedtime.    ? furosemide (LASIX) 20 MG tablet TAKE 1 TABLET BY MOUTH EVERY DAY 90 tablet 1  ?  levothyroxine (SYNTHROID) 25 MCG tablet Take 1 tablet (25 mcg total) by mouth daily. 90 tablet 3  ? memantine (NAMENDA) 5 MG tablet Take 1 tablet (5 mg total) by mouth 2 (two) times daily. (Patient taking differently: Take 2.5 mg by mouth 2 (two) times daily.) 180 tablet 3  ? metoprolol tartrate (LOPRESSOR) 25 MG tablet TAKE 1 TABLET BY MOUTH TWICE A DAY 180 tablet 3  ? mirtazapine (REMERON) 15 MG tablet Take 1 tablet (15 mg total) by mouth at bedtime. 90 tablet 3  ? Multiple Vitamin (MULTIVITAMIN) tablet Take 1 tablet by mouth daily.    ? ?No current facility-administered medications for this visit.  ? ? ?ALLERGIES:  ?Allergies  ?Allergen Reactions  ? Other Other (See Comments)  ? Morphine Other (See Comments)  ?  hallucinate  ? ? ?PHYSICAL EXAM:  ?Performance status (ECOG): 1 - Symptomatic but completely ambulatory ? ?Vitals:  ? 12/09/21 1458  ?BP: 125/65  ?Pulse: 80  ?Resp: 18  ?Temp: 98 ?F (36.7 ?C)  ?SpO2: 92%  ? ?Wt Readings from Last 3 Encounters:  ?12/09/21 174 lb 13.2 oz (79.3 kg)  ?10/09/21 177 lb (80.3 kg)  ?10/06/21 177 lb 6.4 oz (80.5 kg)  ? ?Physical Exam ?Vitals reviewed.  ?Constitutional:   ?   Appearance: Normal appearance.  ?   Comments: In wheelchair  ?Cardiovascular:  ?   Rate and Rhythm: Normal rate and regular rhythm.  ?   Pulses: Normal pulses.  ?   Heart sounds: Normal heart sounds.  ?Pulmonary:  ?   Effort: Pulmonary effort is normal.  ?   Breath sounds: Normal breath sounds.  ?Neurological:  ?   General: No focal deficit present.  ?   Mental Status: He is alert and oriented to  person, place, and time.  ?Psychiatric:     ?   Mood and Affect: Mood normal.     ?   Behavior: Behavior normal.  ?  ? ?LABORATORY DATA:  ?I have reviewed the labs as listed.  ? ?  Latest Ref Rng & Units 12/05/2021

## 2021-12-09 NOTE — Patient Instructions (Signed)
[place] at [facility] ?Discharge Instructions ? ?You were seen and examined today by Dr. Delton Coombes. He reviewed your most recent labs and scan and everything looks stable. Repeat CT scan before next appointment. Please keep follow up appointments in 6 months. ? ? ?Thank you for choosing [place] at [facility] to provide your oncology and hematology care.  To afford each patient quality time with our provider, please arrive at least 15 minutes before your scheduled appointment time.  ? ?If you have a lab appointment with the [facility] please come in thru the Main Entrance and check in at the main information desk. ? ?You need to re-schedule your appointment should you arrive 10 or more minutes late.  We strive to give you quality time with our providers, and arriving late affects you and other patients whose appointments are after yours.  Also, if you no show three or more times for appointments you may be dismissed from the clinic at the providers discretion.     ?Again, thank you for choosing [facility].  Our hope is that these requests will decrease the amount of time that you wait before being seen by our physicians.       ?_____________________________________________________________ ? ?Should you have questions after your visit to [facility], please contact our office at [phone] and follow the prompts.  Our office hours are 8:00 a.m. and 4:30 p.m. Monday - Friday.  Please note that voicemails left after 4:00 p.m. may not be returned until the following business day.  We are closed weekends and major holidays.  You do have access to a nurse 24-7, just call the main number to the clinic [phone] and do not press any options, hold on the line and a nurse will answer the phone.   ? ?For prescription refill requests, have your pharmacy contact our office and allow 72 hours.   ? ?Due to Covid, you will need to wear a mask  upon entering the hospital. If you do not have a mask, a mask will be given to you at the Main Entrance upon arrival. For doctor visits, patients may have 1 support person age 33 or older with them. For treatment visits, patients can not have anyone with them due to social distancing guidelines and our immunocompromised population.  ? ?  ?

## 2022-01-05 ENCOUNTER — Ambulatory Visit (INDEPENDENT_AMBULATORY_CARE_PROVIDER_SITE_OTHER): Payer: Medicare Other | Admitting: Family Medicine

## 2022-01-05 ENCOUNTER — Encounter: Payer: Self-pay | Admitting: Family Medicine

## 2022-01-05 VITALS — BP 134/74 | HR 69 | Temp 97.3°F | Ht 70.0 in | Wt 173.4 lb

## 2022-01-05 DIAGNOSIS — F418 Other specified anxiety disorders: Secondary | ICD-10-CM

## 2022-01-05 DIAGNOSIS — Z23 Encounter for immunization: Secondary | ICD-10-CM

## 2022-01-05 DIAGNOSIS — I480 Paroxysmal atrial fibrillation: Secondary | ICD-10-CM

## 2022-01-05 DIAGNOSIS — S41111A Laceration without foreign body of right upper arm, initial encounter: Secondary | ICD-10-CM | POA: Diagnosis not present

## 2022-01-05 NOTE — Progress Notes (Signed)
? ?Subjective: ?CC: Follow-up mood ?PCP: Janora Norlander, DO ?ZJQ:BHAL Mullins Erik is a 88 y.o. male presenting to clinic today for: ? ?1.  Follow-up mood/skin tear ?Patient is brought to the office by his wife today.  She has noticed a slight improvement with the use of 15 mg of mirtazapine.  Over the last several days he has been the best he has been in a while.  She notes he continues to have problems with memory.  He ran into a door in the last couple of days and scraped his right arm.  She has been utilizing nonstick bandages but wanted me to take a look at that today. ? ?2.  A-fib ?Patient is compliant with his medications.  No rectal bleeding, blood in urine reported.  No reports of heart palpitations or new onset change in exercise tolerance.  Eliquis has been accessible ? ?ROS: Per HPI ? ?Allergies  ?Allergen Reactions  ? Other Other (See Comments)  ? Morphine Other (See Comments)  ?  hallucinate  ? ?Past Medical History:  ?Diagnosis Date  ? AAA (abdominal aortic aneurysm) ([place])   ? Remote ~1994   ? Anemia   ? Arthritis   ? Atrial fibrillation ([facility])   ? Diagnosed December 2014  ? AVNRT (AV nodal re-entry tachycardia) ([place])   ? Possible  ? Cataract   ? Bilateral  ? Cirrhosis ([facility])   ? CKD (chronic kidney disease) stage 3, GFR 30-59 ml/min (HCC)   ? Essential hypertension   ? Gout   ? History of GI bleed   ? History of gout   ? History of hepatitis   ? History of radiation therapy 07/18/2020-08/06/2020  ? L Lung SBRT; Dr. Gery Pray  ? History of stroke   ? Hyperlipidemia   ? PAD (peripheral artery disease) ([place])   ? Stenting in lower extremities (no records)  ? Prostate cancer ([place])   ? XRT; in remission  ? Type 2 diabetes mellitus ([facility])   ? ? ?Current Outpatient Medications:  ?  acetaminophen (TYLENOL) 500 MG tablet, Take 1,000 mg by mouth every 6 (six) hours as needed for headache., Disp: , Rfl:  ?  albuterol (VENTOLIN HFA) 108 (90 Base) MCG/ACT inhaler, Inhale 1-2 puffs into the lungs every 6 (six)  hours as needed for wheezing or shortness of breath. (Patient taking differently: Inhale 2 puffs into the lungs every 6 (six) hours as needed for wheezing or shortness of breath.), Disp: 18 g, Rfl: 2 ?  allopurinol (ZYLOPRIM) 100 MG tablet, TAKE 1 TABLET BY MOUTH EVERY Erik, Disp: 90 tablet, Rfl: 0 ?  ANORO ELLIPTA 62.5-25 MCG/ACT AEPB, INHALE 1 PUFF BY MOUTH EVERY Erik, Disp: 60 each, Rfl: 2 ?  apixaban (ELIQUIS) 5 MG TABS tablet, Take 2.5 mg by mouth 2 (two) times daily., Disp: , Rfl:  ?  betamethasone valerate ointment (VALISONE) 0.1 %, APPLY TOPICALLY 2TIMES DAILY. AVOID FACE, AXILLA AND GROIN. (USE AS NEEDED FOR PSORIASIS FLARES), Disp: 90 g, Rfl: 1 ?  Calcium Carbonate-Vitamin D 600-400 MG-UNIT tablet, Take 1 tablet by mouth daily., Disp: , Rfl:  ?  Carboxymethylcellulose Sodium (ARTIFICIAL TEARS OP), Place 1 drop into both eyes daily as needed (dry eyes)., Disp: , Rfl:  ?  docusate sodium (COLACE) 250 MG capsule, Take 250 mg by mouth at bedtime., Disp: , Rfl:  ?  furosemide (LASIX) 20 MG tablet, TAKE 1 TABLET BY MOUTH EVERY Erik, Disp: 90 tablet, Rfl: 1 ?  levothyroxine (SYNTHROID) 25 MCG tablet, Take  1 tablet (25 mcg total) by mouth daily., Disp: 90 tablet, Rfl: 3 ?  memantine (NAMENDA) 5 MG tablet, Take 1 tablet (5 mg total) by mouth 2 (two) times daily. (Patient taking differently: Take 2.5 mg by mouth 2 (two) times daily.), Disp: 180 tablet, Rfl: 3 ?  metoprolol tartrate (LOPRESSOR) 25 MG tablet, TAKE 1 TABLET BY MOUTH TWICE A Erik, Disp: 180 tablet, Rfl: 3 ?  mirtazapine (REMERON) 15 MG tablet, Take 1 tablet (15 mg total) by mouth at bedtime., Disp: 90 tablet, Rfl: 3 ?  Multiple Vitamin (MULTIVITAMIN) tablet, Take 1 tablet by mouth daily., Disp: , Rfl:  ?Social History  ? ?Socioeconomic History  ? Marital status: Married  ?  Spouse name: Erik Mullins  ? Number of children: 2  ? Years of education: Not on file  ? Highest education level: Not on file  ?Occupational History  ? Occupation: retired  ?  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 Smoking status: Former  ?  Packs/Erik: 1.00  ?  Years: 70.00  ?  Pack years: 70.00  ?  Types: Cigarettes  ?  Start date: 09/11/1944  ?  Quit date: 02/17/2020  ?  Years since quitting: 1.8  ? Smokeless tobacco: Former  ?  Types: Snuff, Chew  ?Vaping Use  ? Vaping Use: Never used  ?Substance and Sexual Activity  ? Alcohol use: Not Currently  ?  Alcohol/week: 0.0 standard drinks  ?  Comment: 08/22/2013 "drink ~ 1 beer and wine/yr"  ? Drug use: No  ? Sexual activity: Never  ?Other Topics Concern  ? Not on file  ?Social History Narrative  ? From [place]. Relocated to this area from [facility].  ? ?Social Determinants of Health  ? ?Financial Resource Strain: Low Risk   ? Difficulty of Paying Living Expenses: Not hard at all  ?Food Insecurity: No Food Insecurity  ? Worried About Charity fundraiser in the Last Year: Never true  ? Ran Out of Food in the Last Year: Never true  ?Transportation Needs: No Transportation Needs  ? Lack of Transportation (Medical): No  ? Lack of Transportation (Non-Medical): No  ?Physical Activity: Inactive  ? Days of Exercise per Week: 0 days  ? Minutes of Exercise per Session: 0 min  ?Stress: No Stress Concern Present  ? Feeling of Stress : Only a little  ?Social Connections: Socially Integrated  ? Frequency of Communication with Friends and Family: Never  ? Frequency of Social Gatherings with Friends and Family: More than three times a week  ? Attends Religious Services: More than 4 times per year  ? Active Member of Clubs or Organizations: Yes  ? Attends Archivist Meetings: 1 to 4 times per year  ? Marital Status: Married  ?Intimate Partner Violence: Not At Risk  ? Fear of Current or Ex-Partner: No  ? Emotionally Abused: No  ? Physically Abused: No  ? Sexually Abused: No  ? ?Family History  ?Problem Relation Age of Onset  ? Cancer Mother   ?     uterine deceased age 30  ? Hypertension Sister   ? Arrhythmia Other   ?     Atrial fibrillation  ? Hypertension  Maternal Grandmother   ? ? ?Objective: ?Office vital signs reviewed. ?BP 134/74   Pulse 69   Temp (!) 97.3 ?F (36.3 ?C)   Ht 5\' 10"  (1.778 m)   Wt 173 lb 6.4 oz (78.7 kg)   SpO2 94%   BMI  24.88 kg/m?  ? ?Physical Examination:  ?General: Awake, alert, well nourished, No acute distress ?HEENT: Sclera white ?Cardio: Irregularly irregular with rate controlled.  S1S2 heard, no murmurs appreciated ?Pulm: clear to auscultation bilaterally, no wheezes, rhonchi or rales; normal work of breathing on room air ?Extremities: Right upper extremity with a large skin tear that is using a little bit of blood.  There is triple antibiotic ointment in place.  No evidence of secondary infection. ?Psych: Very pleasant, interactive ? ? ?  01/05/2022  ? 11:17 AM 10/06/2021  ? 10:28 AM 07/28/2021  ?  3:05 PM  ?Depression screen PHQ 2/9  ?Decreased Interest 0 2 2  ?Down, Depressed, Hopeless 0 2 2  ?PHQ - 2 Score 0 4 4  ?Altered sleeping 0 0 0  ?Tired, decreased energy 0 3 2  ?Change in appetite 0 1 1  ?Feeling bad or failure about yourself  0 1 1  ?Trouble concentrating 0 3 2  ?Moving slowly or fidgety/restless 0 2 1  ?Suicidal thoughts 0 3 0  ?PHQ-9 Score 0 17 11  ?Difficult doing work/chores Not difficult at all Somewhat difficult Somewhat difficult  ? ?Assessment/ Plan: ?88 y.o. male  ? ?Depression with anxiety ? ?Paroxysmal atrial fibrillation (HCC) ? ?Skin tear of right upper arm without complication, initial encounter ? ?Anxiety depression are stable with mirtazapine.  No changes. ? ?He is rate controlled today.  Not having any complications with the Eliquis ? ?The skin tear was gently cleaned and new bandage placed.  No evidence of secondary infection.  He was updated on his tetanus shot today as it expires in less than 1 year. ? ?No orders of the defined types were placed in this encounter. ? ?No orders of the defined types were placed in this encounter. ? ? ? ?Janora Norlander, DO ?[place] ?[phone] ? ? ?

## 2022-01-05 NOTE — Patient Instructions (Signed)
Skin Tear ?A skin tear is a wound in which the top layers of skin peel off. This is a common problem for older people. It can also be a problem for people who take certain medicines for too long. ?To repair the skin, your doctor may use: ?Tape. ?Skin tape (adhesive) strips. ?A bandage (dressing) may also be placed over the tape or skin tape strips. ?Follow these instructions at home: ?Keep your wound clean ?Clean the wound as told by your doctor. You may be told to keep the wound dry for the first few days. If you are told to clean the wound: ?Wash the wound with mild soap and water, a wound cleanser, or a salt-water (saline) solution. ?If you use soap, rinse the wound with water to get all the soap off. ?Do not rub the wound dry. Pat the wound gently with a clean towel or let it air-dry. ?Change any bandage as told by your doctor. This includes changing the bandage if it gets wet, gets dirty, or starts to smell bad. To change your bandage: ?Wash your hands with soap and water for at least 20 seconds before and after you change your bandage. If you cannot use soap and water, use hand sanitizer. ?Leave tape or skin tape strips alone unless you are told to take them off. You may trim the edges of the tape strips if they curl up. ?Watch for signs of infection ?Check your wound every day for signs of infection. Check for: ?Redness, swelling, or pain. ?More fluid or blood. ?Warmth. ?Pus or a bad smell. ? ?Protect your wound ?Do not scratch or pick at the wound. ?Protect the injured area until it has healed. ?Medicines ?Take or apply over-the-counter and prescription medicines only as told by your doctor. ?If you were prescribed an antibiotic medicine, take or apply it as told by your doctor. Do not stop using it even if your condition gets better. ?General instructions ? ?Keep the bandage dry. ?Do not take baths, swim, use a hot tub, or do anything that puts your wound underwater. Ask your doctor about taking showers or  sponge baths. ?Keep all follow-up visits. ?Contact a doctor if: ?You have any of these signs of infection in your wound: ?Redness, swelling, or pain. ?More fluid or blood. ?Warmth. ?Pus or a bad smell. ?Get help right away if: ?You have a red streak that goes away from the skin tear. ?You have a fever and chills, and your symptoms get worse all of a sudden. ?Summary ?A skin tear is a wound in which the top layers of skin peel off. ?To repair the skin, your doctor may use tape or skin tape strips. ?Change any bandage as told by your doctor. ?Take or apply over-the-counter and prescription medicines only as told by your doctor. ?Contact a doctor if you have signs of infection. ?This information is not intended to replace advice given to you by your health care provider. Make sure you discuss any questions you have with your health care provider. ?Document Revised: 12/06/2019 Document Reviewed: 12/06/2019 ?Elsevier Patient Education ? [place]. ? ?

## 2022-01-07 ENCOUNTER — Other Ambulatory Visit: Payer: Self-pay | Admitting: Family Medicine

## 2022-01-07 DIAGNOSIS — J449 Chronic obstructive pulmonary disease, unspecified: Secondary | ICD-10-CM

## 2022-01-09 ENCOUNTER — Other Ambulatory Visit: Payer: Self-pay | Admitting: Family Medicine

## 2022-01-09 DIAGNOSIS — N183 Chronic kidney disease, stage 3 unspecified: Secondary | ICD-10-CM

## 2022-02-06 ENCOUNTER — Other Ambulatory Visit: Payer: Self-pay | Admitting: Cardiology

## 2022-03-03 ENCOUNTER — Other Ambulatory Visit: Payer: Self-pay | Admitting: Family Medicine

## 2022-03-03 DIAGNOSIS — F418 Other specified anxiety disorders: Secondary | ICD-10-CM

## 2022-03-09 LAB — HEPATIC FUNCTION PANEL
ALT: 15 U/L (ref 10–40)
AST: 16 (ref 14–40)
Alkaline Phosphatase: 107 (ref 25–125)
Bilirubin, Direct: 0.1 (ref 0.01–0.4)
Bilirubin, Total: 0.5

## 2022-03-09 LAB — LIPID PANEL
Cholesterol: 196 (ref 0–200)
HDL: 42 (ref 35–70)
LDL Cholesterol: 92
Triglycerides: 310 — AB (ref 40–160)

## 2022-03-09 LAB — CBC AND DIFFERENTIAL
HCT: 43 (ref 41–53)
Hemoglobin: 14.3 (ref 13.5–17.5)
Platelets: 168 10*3/uL (ref 150–400)
WBC: 6.9

## 2022-03-09 LAB — CBC: RBC: 4.71 (ref 3.87–5.11)

## 2022-03-11 ENCOUNTER — Encounter: Payer: Self-pay | Admitting: Cardiology

## 2022-03-11 ENCOUNTER — Ambulatory Visit (INDEPENDENT_AMBULATORY_CARE_PROVIDER_SITE_OTHER): Payer: Medicare Other | Admitting: Cardiology

## 2022-03-11 VITALS — BP 112/68 | HR 62 | Ht 70.5 in | Wt 174.6 lb

## 2022-03-11 DIAGNOSIS — I4821 Permanent atrial fibrillation: Secondary | ICD-10-CM

## 2022-03-11 DIAGNOSIS — N1832 Chronic kidney disease, stage 3b: Secondary | ICD-10-CM | POA: Diagnosis not present

## 2022-03-11 DIAGNOSIS — I10 Essential (primary) hypertension: Secondary | ICD-10-CM | POA: Diagnosis not present

## 2022-03-11 NOTE — Patient Instructions (Addendum)
Medication Instructions:   Your physician recommends that you continue on your current medications as directed. Please refer to the Current Medication list given to you today.  Labwork:  none  Testing/Procedures:  none  Follow-Up:  Your physician recommends that you schedule a follow-up appointment in: as needed.  Any Other Special Instructions Will Be Listed Below (If Applicable).  If you need a refill on your cardiac medications before your next appointment, please call your pharmacy. 

## 2022-03-11 NOTE — Progress Notes (Signed)
Cardiology Office Note  Date: 03/11/2022   ID: Erik Mullins, DOB [birth year], MRN [id number]  PCP:  Janora Norlander, DO  Cardiologist:  Rozann Lesches, MD Electrophysiologist:  None   Chief Complaint  Patient presents with   Cardiac follow-up    History of Present Illness: Erik Mullins is an 88 y.o. male last seen in December 2022.  He is here for a follow-up visit today with his wife.  She is his primary caregiver, he continues to have advancing dementia.  She helps with all of his medications and transportation.  She mentions that it has been very difficult to get him out for visits.  He did have a recent assessment through the [facility] system with lab work obtained but pending.  No obvious bleeding problems on Eliquis.  He does not report any obvious sense of palpitations or chest pain on questioning today.  Past Medical History:  Diagnosis Date   AAA (abdominal aortic aneurysm) ([place])    Remote ~1994    Anemia    Arthritis    Atrial fibrillation [facility])    Diagnosed December 2014   AVNRT (AV nodal re-entry tachycardia) ([place])    Possible   Cataract    Bilateral   Cirrhosis ([place])    CKD (chronic kidney disease) stage 3, GFR 30-59 ml/min (HCC)    Essential hypertension    Gout    History of GI bleed    History of gout    History of hepatitis    History of radiation therapy 07/18/2020-08/06/2020   L Lung SBRT; Dr. Gery Pray   History of stroke    Hyperlipidemia    PAD (peripheral artery disease) ([place])    Stenting in lower extremities (no records)   Prostate cancer ([place])    XRT; in remission   Type 2 diabetes mellitus ([place])     Past Surgical History:  Procedure Laterality Date   ABDOMINAL AORTIC ANEURYSM REPAIR  1980's   in [place] Right ~ [place], BILATERAL Bilateral 2014   CHOLECYSTECTOMY     COLON SURGERY     PARTIAL COLECTOMY     "I was  bleeding to death inside; took out 2/3 of my colon" (08/22/2013)    Current Outpatient Medications  Medication Sig Dispense Refill   acetaminophen (TYLENOL) 500 MG tablet Take 1,000 mg by mouth every 6 (six) hours as needed for headache.     albuterol (VENTOLIN HFA) 108 (90 Base) MCG/ACT inhaler Inhale 1-2 puffs into the lungs every 6 (six) hours as needed for wheezing or shortness of breath. (Patient taking differently: Inhale 2 puffs into the lungs every 6 (six) hours as needed for wheezing or shortness of breath.) 18 g 2   allopurinol (ZYLOPRIM) 100 MG tablet TAKE 1 TABLET BY MOUTH EVERY DAY 90 tablet 0   ANORO ELLIPTA 62.5-25 MCG/ACT AEPB INHALE 1 PUFF BY MOUTH EVERY DAY 60 each 2   apixaban (ELIQUIS) 5 MG TABS tablet Take 2.5 mg by mouth 2 (two) times daily.     betamethasone valerate ointment (VALISONE) 0.1 % APPLY TOPICALLY 2TIMES DAILY. AVOID FACE, AXILLA AND GROIN. (USE AS NEEDED FOR PSORIASIS FLARES) 90 g 1   Calcium Carbonate-Vitamin D 600-400 MG-UNIT tablet Take 1 tablet by mouth daily.     Carboxymethylcellulose Sodium (ARTIFICIAL TEARS OP) Place 1 drop into both eyes daily as needed (dry eyes).  docusate sodium (COLACE) 250 MG capsule Take 250 mg by mouth 2 (two) times daily.     furosemide (LASIX) 20 MG tablet TAKE 1 TABLET BY MOUTH EVERY DAY 90 tablet 1   levothyroxine (SYNTHROID) 25 MCG tablet Take 1 tablet (25 mcg total) by mouth daily. 90 tablet 3   memantine (NAMENDA) 5 MG tablet Take 2.5 mg by mouth 2 (two) times daily.     metoprolol tartrate (LOPRESSOR) 25 MG tablet TAKE 1 TABLET BY MOUTH TWICE A DAY 180 tablet 3   mirtazapine (REMERON) 15 MG tablet Take 1 tablet (15 mg total) by mouth at bedtime. 90 tablet 3   Multiple Vitamin (MULTIVITAMIN) tablet Take 1 tablet by mouth daily.     No current facility-administered medications for this visit.   Allergies:  Other and Morphine   ROS: No recent falls.  Physical Exam: VS:  BP 112/68   Pulse 62   Ht 5' 10.5" (1.791  m)   Wt 174 lb 9.6 oz (79.2 kg)   SpO2 92%   BMI 24.70 kg/m , BMI Body mass index is 24.7 kg/m.  Wt Readings from Last 3 Encounters:  03/11/22 174 lb 9.6 oz (79.2 kg)  01/05/22 173 lb 6.4 oz (78.7 kg)  12/09/21 174 lb 13.2 oz (79.3 kg)    General: Patient appears comfortable at rest. HEENT: Conjunctiva and lids normal, oropharynx clear. Neck: Supple, no elevated JVP or carotid bruits, no thyromegaly. Lungs: Irregularly irregular, nonlabored breathing at rest. Cardiac: Regular rate and rhythm, no S3 or significant systolic murmur. Extremities: No pitting edema.  ECG:  An ECG dated 09/01/2021 was personally reviewed today and demonstrated:  Atrial fibrillation.  Recent Labwork: 05/15/2021: Magnesium 2.0 10/03/2021: TSH 5.690 12/05/2021: ALT 10; AST 16; BUN 32; Creatinine, Ser 1.88; Hemoglobin 16.3; Platelets 166; Potassium 4.5; Sodium 143     Component Value Date/Time   CHOL 190 10/03/2021 0950   CHOL 122 02/07/2013 0903   TRIG 245 (H) 10/03/2021 0950   TRIG 168 (H) 10/24/2013 0909   TRIG 140 02/07/2013 0903   HDL 51 10/03/2021 0950   HDL 44 10/24/2013 0909   HDL 41 02/07/2013 0903   CHOLHDL 3.7 10/03/2021 0950   LDLCALC 98 10/03/2021 0950   LDLCALC 69 10/24/2013 0909   LDLCALC 53 02/07/2013 0903    Other Studies Reviewed Today:  Echocardiogram 01/14/2021:  1. Left ventricular ejection fraction, by estimation, is 60 to 65%. The  left ventricle has normal function. The left ventricle has no regional  wall motion abnormalities. Left ventricular diastolic parameters are  indeterminate.   2. RV-RA gradient 41 mmHg. Right ventricular systolic function is mildly  reduced. The right ventricular size is mildly enlarged.   3. The mitral valve is grossly normal. Mild mitral valve regurgitation.   4. The aortic valve is tricuspid. There is moderate calcification of the  aortic valve. Aortic valve regurgitation is not visualized. Mild to  moderate aortic valve sclerosis/calcification  is present, without any  evidence of aortic stenosis.   5. Unable to estimate CVP.   Assessment and Plan:  1.  Permanent atrial fibrillation with CHA2DS2-VASc score of 6.  He is tolerating low-dose Eliquis without obvious bleeding problems, had recent lab work through the [facility] system.  Heart rate is controlled on Lopressor and he does not report any obvious sense of palpitations.  With his advancing dementia, his wife has had more difficulty getting him out for regular visits.  He will keep follow-up with PCP going forward  and we can certainly assist as needed with questions.  2.  Essential hypertension, blood pressure is well controlled today on Lopressor.  3.  CKD stage IIIb, creatinine 1.88 with normal potassium.  Medication Adjustments/Labs and Tests Ordered: Current medicines are reviewed at length with the patient today.  Concerns regarding medicines are outlined above.   Tests Ordered: No orders of the defined types were placed in this encounter.   Medication Changes: No orders of the defined types were placed in this encounter.   Disposition:  Follow up  as needed.  Signed, Satira Sark, MD, [facility] 03/11/2022 1:00 PM    [facility] [place], [place], [place] [address] Phone: [phone]; Fax: [phone]

## 2022-03-12 ENCOUNTER — Ambulatory Visit: Payer: Medicare Other | Admitting: Urology

## 2022-04-06 ENCOUNTER — Ambulatory Visit (INDEPENDENT_AMBULATORY_CARE_PROVIDER_SITE_OTHER): Payer: Medicare Other | Admitting: Family Medicine

## 2022-04-06 ENCOUNTER — Other Ambulatory Visit: Payer: Self-pay | Admitting: Family Medicine

## 2022-04-06 ENCOUNTER — Encounter: Payer: Self-pay | Admitting: Family Medicine

## 2022-04-06 VITALS — BP 118/70 | HR 53 | Temp 97.6°F | Ht 70.5 in | Wt 176.2 lb

## 2022-04-06 DIAGNOSIS — N183 Chronic kidney disease, stage 3 unspecified: Secondary | ICD-10-CM

## 2022-04-06 DIAGNOSIS — F0150 Vascular dementia without behavioral disturbance: Secondary | ICD-10-CM

## 2022-04-06 DIAGNOSIS — R972 Elevated prostate specific antigen [PSA]: Secondary | ICD-10-CM | POA: Diagnosis not present

## 2022-04-06 DIAGNOSIS — N1832 Chronic kidney disease, stage 3b: Secondary | ICD-10-CM | POA: Diagnosis not present

## 2022-04-06 DIAGNOSIS — L089 Local infection of the skin and subcutaneous tissue, unspecified: Secondary | ICD-10-CM | POA: Diagnosis not present

## 2022-04-06 DIAGNOSIS — E034 Atrophy of thyroid (acquired): Secondary | ICD-10-CM

## 2022-04-06 MED ORDER — ALLOPURINOL 100 MG PO TABS: 100.0000 mg | ORAL_TABLET | Freq: Every day | ORAL | 3 refills | Status: DC

## 2022-04-06 MED ORDER — CEPHALEXIN 250 MG PO CAPS: 250.0000 mg | ORAL_CAPSULE | Freq: Four times a day (QID) | ORAL | 0 refills | Status: AC

## 2022-04-06 NOTE — Progress Notes (Signed)
Subjective: CC: Follow-up dementia PCP: Erik Norlander, DO NTI:RWER T Erik Mullins is a 88 y.o. male presenting to clinic today for:  1.  Dementia His wife notes that some days he seems to be worse than others.  He has not had any wandering behaviors or mood disturbances.  He is not aggressive but does sometimes get upset when things are not going his way.  2.  Skin tear Patient sustained a skin tear about a week ago when he ran against a door.  This affected his right elbow.  They have been using Neosporin in the affected area in efforts to heal it.  Sometimes it burns and itches.  No fevers reported.  3.  CKD, elevated PSA Patient had labs done at the [place] and his wife brings me copies of these today.  He has a PSA that is overdue for checked by Dr. Jeffie Pollock.  Asking to have that done with other labs today   ROS: Per HPI  Allergies  Allergen Reactions   Other Other (See Comments)   Morphine Other (See Comments)    hallucinate   Past Medical History:  Diagnosis Date   AAA (abdominal aortic aneurysm) ([place])    Remote ~1994    Anemia    Arthritis    Atrial fibrillation [facility])    Diagnosed December 2014   AVNRT (AV nodal re-entry tachycardia) ([place])    Possible   Cataract    Bilateral   Cirrhosis ([place])    CKD (chronic kidney disease) stage 3, GFR 30-59 ml/min (HCC)    Essential hypertension    Gout    History of GI bleed    History of gout    History of hepatitis    History of radiation therapy 07/18/2020-08/06/2020   L Lung SBRT; Dr. Gery Pray   History of stroke    Hyperlipidemia    PAD (peripheral artery disease) ([place])    Stenting in lower extremities (no records)   Prostate cancer ([place])    XRT; in remission   Type 2 diabetes mellitus ([place])     Current Outpatient Medications:    acetaminophen (TYLENOL) 500 MG tablet, Take 1,000 mg by mouth every 6 (six) hours as needed for headache., Disp: , Rfl:    albuterol (VENTOLIN HFA) 108 (90 Base) MCG/ACT inhaler, Inhale  1-2 puffs into the lungs every 6 (six) hours as needed for wheezing or shortness of breath. (Patient taking differently: Inhale 2 puffs into the lungs every 6 (six) hours as needed for wheezing or shortness of breath.), Disp: 18 g, Rfl: 2   allopurinol (ZYLOPRIM) 100 MG tablet, TAKE 1 TABLET BY MOUTH EVERY DAY, Disp: 90 tablet, Rfl: 0   ANORO ELLIPTA 62.5-25 MCG/ACT AEPB, INHALE 1 PUFF BY MOUTH EVERY DAY, Disp: 60 each, Rfl: 2   apixaban (ELIQUIS) 5 MG TABS tablet, Take 2.5 mg by mouth 2 (two) times daily., Disp: , Rfl:    betamethasone valerate ointment (VALISONE) 0.1 %, APPLY TOPICALLY 2TIMES DAILY. AVOID FACE, AXILLA AND GROIN. (USE AS NEEDED FOR PSORIASIS FLARES), Disp: 90 g, Rfl: 1   Calcium Carbonate-Vitamin D 600-400 MG-UNIT tablet, Take 1 tablet by mouth daily., Disp: , Rfl:    Carboxymethylcellulose Sodium (ARTIFICIAL TEARS OP), Place 1 drop into both eyes daily as needed (dry eyes)., Disp: , Rfl:    docusate sodium (COLACE) 250 MG capsule, Take 250 mg by mouth 2 (two) times daily., Disp: , Rfl:    furosemide (LASIX) 20 MG tablet, TAKE 1 TABLET BY  MOUTH EVERY DAY, Disp: 90 tablet, Rfl: 1   levothyroxine (SYNTHROID) 25 MCG tablet, Take 1 tablet (25 mcg total) by mouth daily., Disp: 90 tablet, Rfl: 3   memantine (NAMENDA) 5 MG tablet, Take 2.5 mg by mouth 2 (two) times daily., Disp: , Rfl:    metoprolol tartrate (LOPRESSOR) 25 MG tablet, TAKE 1 TABLET BY MOUTH TWICE A DAY, Disp: 180 tablet, Rfl: 3   mirtazapine (REMERON) 15 MG tablet, Take 1 tablet (15 mg total) by mouth at bedtime., Disp: 90 tablet, Rfl: 3   Multiple Vitamin (MULTIVITAMIN) tablet, Take 1 tablet by mouth daily., Disp: , Rfl:  Social History   Socioeconomic History   Marital status: Married    Spouse name: Erik Mullins   Number of children: 2   Years of education: Not on file   Highest education level: Not on file  Occupational History   Occupation: retired    Comment: [employer]  Tobacco Use   Smoking status: Former     Packs/day: 1.00    Years: 70.00    Total pack years: 70.00    Types: Cigarettes    Start date: 09/11/1944    Quit date: 02/17/2020    Years since quitting: 2.1   Smokeless tobacco: Former    Types: Snuff, Chew  Vaping Use   Vaping Use: Never used  Substance and Sexual Activity   Alcohol use: Not Currently    Alcohol/week: 0.0 standard drinks of alcohol    Comment: 08/22/2013 "drink ~ 1 beer and wine/yr"   Drug use: No   Sexual activity: Never  Other Topics Concern   Not on file  Social History Narrative   From [place]. Relocated to this area from [facility].   Social Determinants of Health   Financial Resource Strain: Low Risk  (07/28/2021)   Overall Financial Resource Strain (CARDIA)    Difficulty of Paying Living Expenses: Not hard at all  Food Insecurity: No Food Insecurity (07/28/2021)   Hunger Vital Sign    Worried About Running Out of Food in the Last Year: Never true    Ran Out of Food in the Last Year: Never true  Transportation Needs: No Transportation Needs (07/28/2021)   PRAPARE - Hydrologist (Medical): No    Lack of Transportation (Non-Medical): No  Physical Activity: Inactive (07/28/2021)   Exercise Vital Sign    Days of Exercise per Week: 0 days    Minutes of Exercise per Session: 0 min  Stress: No Stress Concern Present (07/28/2021)   [place]    Feeling of Stress : Only a little  Social Connections: Socially Integrated (07/28/2021)   Social Connection and Isolation Panel [NHANES]    Frequency of Communication with Friends and Family: Never    Frequency of Social Gatherings with Friends and Family: More than three times a week    Attends Religious Services: More than 4 times per year    Active Member of [employer] or Organizations: Yes    Attends Archivist Meetings: 1 to 4 times per year    Marital Status: Married  Human resources officer Violence: Not At  Risk (07/28/2021)   Humiliation, Afraid, Rape, and Kick questionnaire    Fear of Current or Ex-Partner: No    Emotionally Abused: No    Physically Abused: No    Sexually Abused: No   Family History  Problem Relation Age of Onset   Cancer Mother  uterine deceased age 89   Hypertension Sister    Arrhythmia Other        Atrial fibrillation   Hypertension Maternal Grandmother     Objective: Office vital signs reviewed. BP 118/70   Pulse (!) 53   Temp 97.6 F (36.4 C)   Ht 5' 10.5" (1.791 m)   Wt 176 lb 3.2 oz (79.9 kg)   SpO2 95%   BMI 24.92 kg/m   Physical Examination:  General: Awake, alert, nontoxic male, No acute distress HEENT: Moist mucous membranes Cardio: Slightly bradycardic with regular rhythm.  S1S2 heard, no murmurs appreciated Pulm: clear to auscultation bilaterally, no wheezes, rhonchi or rales; normal work of breathing on room air Skin: Healing skin tear noted along the right cubital fossa.  He has quite a bit of erythema, warmth and swelling in this area.  There is some dried exudates noted medially.  No significant tenderness to palpation Neuro: Word finding difficulty.  Somewhat confused  Assessment/ Plan: 88 y.o. male   Infected skin tear - Plan: cephALEXin (KEFLEX) 250 MG capsule  Hypothyroidism due to acquired atrophy of thyroid - Plan: TSH, T4, Free  Vascular dementia without behavioral disturbance (HCC)  Stage 3b chronic kidney disease ([place]) - Plan: allopurinol (ZYLOPRIM) 100 MG tablet  Elevated PSA - Plan: PSA  Keflex 250 mg 4 times daily, renally dosed, has been sent to his pharmacy.  I cleaned the wound myself and dressed it with Xeroform bandage, Telfa and a Coban.  Instructions for home care discussed with his wife.  No Neosporin.  Xeroform bandages have been provided to her for continued wound care.  We discussed red flag signs symptoms warranting further evaluation.  Up-to-date on tetanus  Asymptomatic from a thyroid standpoint.   Check thyroid levels along with PSA which is overdue for recheck.  We will CC to Dr. Jeffie Pollock  Recently had CMP, lipid and CBC obtained at the [facility] and we will get these scanned into his chart.  Vascular dementia is chronic and slightly progressive.  He is still safe to self and his wife so no interventions needed at this time.   No orders of the defined types were placed in this encounter.  No orders of the defined types were placed in this encounter.    Erik Norlander, DO [place] [phone]

## 2022-04-06 NOTE — Patient Instructions (Signed)
Skin Tear A skin tear is a wound in which the top layers of skin peel off. This is a common problem for older people. It can also be a problem for people who take certain medicines for too long. To repair the skin, your doctor may use: Tape. Skin tape (adhesive) strips. A bandage (dressing) may also be placed over the tape or skin tape strips. Follow these instructions at home: Keep your wound clean Clean the wound as told by your doctor. You may be told to keep the wound dry for the first few days. If you are told to clean the wound: Wash the wound with mild soap and water, a wound cleanser, or a salt-water (saline) solution. If you use soap, rinse the wound with water to get all the soap off. Do not rub the wound dry. Pat the wound gently with a clean towel or let it air-dry. Change any bandage as told by your doctor. This includes changing the bandage if it gets wet, gets dirty, or starts to smell bad. To change your bandage: Wash your hands with soap and water for at least 20 seconds before and after you change your bandage. If you cannot use soap and water, use hand sanitizer. Leave tape or skin tape strips alone unless you are told to take them off. You may trim the edges of the tape strips if they curl up. Watch for signs of infection Check your wound every day for signs of infection. Check for: Redness, swelling, or pain. More fluid or blood. Warmth. Pus or a bad smell.  Protect your wound Do not scratch or pick at the wound. Protect the injured area until it has healed. Medicines Take or apply over-the-counter and prescription medicines only as told by your doctor. If you were prescribed an antibiotic medicine, take or apply it as told by your doctor. Do not stop using it even if your condition gets better. General instructions  Keep the bandage dry. Do not take baths, swim, use a hot tub, or do anything that puts your wound underwater. Ask your doctor about taking showers or  sponge baths. Keep all follow-up visits. Contact a doctor if: You have any of these signs of infection in your wound: Redness, swelling, or pain. More fluid or blood. Warmth. Pus or a bad smell. Get help right away if: You have a red streak that goes away from the skin tear. You have a fever and chills, and your symptoms get worse all of a sudden. Summary A skin tear is a wound in which the top layers of skin peel off. To repair the skin, your doctor may use tape or skin tape strips. Change any bandage as told by your doctor. Take or apply over-the-counter and prescription medicines only as told by your doctor. Contact a doctor if you have signs of infection. This information is not intended to replace advice given to you by your health care provider. Make sure you discuss any questions you have with your health care provider. Document Revised: 12/06/2019 Document Reviewed: 12/06/2019 Elsevier Patient Education  [place].

## 2022-04-07 LAB — PSA: Prostate Specific Ag, Serum: 4 ng/mL (ref 0.0–4.0)

## 2022-04-07 LAB — TSH: TSH: 3.15 u[IU]/mL (ref 0.450–4.500)

## 2022-04-07 LAB — T4, FREE: Free T4: 1.15 ng/dL (ref 0.82–1.77)

## 2022-04-09 ENCOUNTER — Encounter: Payer: Self-pay | Admitting: Urology

## 2022-04-09 ENCOUNTER — Ambulatory Visit (INDEPENDENT_AMBULATORY_CARE_PROVIDER_SITE_OTHER): Payer: Medicare Other | Admitting: Urology

## 2022-04-09 VITALS — BP 142/84 | HR 105

## 2022-04-09 DIAGNOSIS — R350 Frequency of micturition: Secondary | ICD-10-CM | POA: Diagnosis not present

## 2022-04-09 DIAGNOSIS — R9721 Rising PSA following treatment for malignant neoplasm of prostate: Secondary | ICD-10-CM

## 2022-04-09 DIAGNOSIS — C61 Malignant neoplasm of prostate: Secondary | ICD-10-CM

## 2022-04-09 MED ORDER — LEUPROLIDE ACETATE (6 MONTH) 45 MG ~~LOC~~ KIT
45.0000 mg | PACK | Freq: Once | SUBCUTANEOUS | Status: AC
  Administered 2022-04-09: 45 mg via SUBCUTANEOUS

## 2022-04-09 MED ORDER — LEUPROLIDE ACETATE (6 MONTH) 45 MG IM KIT: 45.0000 mg | PACK | Freq: Once | INTRAMUSCULAR | Status: DC

## 2022-04-09 NOTE — Progress Notes (Signed)
Subjective:  1. Prostate cancer ([place])   2. Rising PSA following treatment for malignant neoplasm of prostate   3. Urinary frequency    04/09/22: Erik Mullins returns today in f/u for his history of NM CRCP.  He is due for Lupron today with the last dose on 10/09/21.  His PSA is back down some to 4 after rising to 6.2 in 3/23.  He is voiding ok but has frequency and some straining to void.  He has minimal nocturia. His weight is stable.  He has no bone pain.  His Cr was 1.88 in March and he is doing well with the lung cancer.   10/09/21: Erik Mullins returns today in f/u.  He has NM CRCP.  His last Lupron was on 04/10/21.  His PSA continues to rise slowly and was 5.6 on 10/03/21 which was up from 4.4 on 07/04/21 and 3.2 on 04/02/21.  He also has lung CA treated with SBRT in 11/21 and CKD 3b with a Cr of 2.2 on 10/04/21 which is in his usual range.  He had a PMSA PET scan in 8/22 and had only evidence of local recurrence in the right prostate. He is voiding with some frequency but he sits to void.  His IPSS is 10.  He has no bone pain but he has arthralgias.  He has stable weight.  He has some fecal urgency but no other GI complaints.   04/10/21: Erik Mullins returns today in f/u for his history of NM CRCP.  His last Lupron was on 10/03/20.   His PSA  is back up to 3.2 from 2.26 on 02/11/21 and 1.7 on 01/01/21.  The testosterone level remains low at <3.     He had a Chest CT on 02/11/21 that was stable.   His UA is clear today.   He is voiding well but has some frequency with a diuretic.  He has nocturia x 1.   He has had no hematuria.  He has had no weight loss.  He has no bone pain.   He has some chest discomfort that is felt to be from the lung radiation therapy.    08/23/20: Erik Mullins returns today in f/u.  He was found to have lung cancer at his last visit and has subsequently been treated with radiation therapy.    His PSA continues to rise and was 2.0 on 08/20/20 which is up from 1.5 in 7/21 and 0.8 in 12/20.  He T was castrate in 04/02/20.   He is voiding well but has stable nocturia 2-3x.  He has no bone pain or weight loss.    05/24/20: Erik Mullins returns today in f/u from an [place] PET done for his history of prostate cancer that was diagnosed in 4/10 and he completed radiation therapy on 08/16/09.  He had gleason 9 disease intiallly.  He had a PSA recurrence and was started on Lupron in 2017 after his PSA rose to 9.9 in 6/17 from 3.6 in 12/16.  A bone scan and CT in 7/17 showed no mets.   His PSA nadired at 0.2 on Lupron in 9/18 but has been rising and was 0.4 in 3/20, 0.8 in 12/20 and 1.5 on 04/02/20.  His testosterone has remained castrate.  His last Lupron injection was on 04/05/20.   He is doing well without other associated signs or symptoms.  He is voiding well with an IPSS of 3-4 with nocturia x 2-3.  He drinks a lot of coffee prior to bed.  He has no bone pain or weight loss.   He has no hematuria.     The PET scan showed a 1cm nodule in the left upper lobe that was hypermetabolic and could be prostatic or a bronchogenic carcinoma.  Tissue sampling was recommended.  There was some diffuse uptake in the prostate as well.        ROS:  Review of Systems  Psychiatric/Behavioral:  Positive for memory loss.   All other systems reviewed and are negative.   Allergies  Allergen Reactions   Other Other (See Comments)   Morphine Other (See Comments)    hallucinate    Outpatient Encounter Medications as of 04/09/2022  Medication Sig   acetaminophen (TYLENOL) 500 MG tablet Take 1,000 mg by mouth every 6 (six) hours as needed for headache.   albuterol (VENTOLIN HFA) 108 (90 Base) MCG/ACT inhaler Inhale 1-2 puffs into the lungs every 6 (six) hours as needed for wheezing or shortness of breath. (Patient taking differently: Inhale 2 puffs into the lungs every 6 (six) hours as needed for wheezing or shortness of breath.)   allopurinol (ZYLOPRIM) 100 MG tablet Take 1 tablet (100 mg total) by mouth daily.   ANORO ELLIPTA 62.5-25 MCG/ACT  AEPB INHALE 1 PUFF BY MOUTH EVERY DAY   apixaban (ELIQUIS) 5 MG TABS tablet Take 2.5 mg by mouth 2 (two) times daily.   betamethasone valerate ointment (VALISONE) 0.1 % APPLY TOPICALLY 2TIMES DAILY. AVOID FACE, AXILLA AND GROIN. (USE AS NEEDED FOR PSORIASIS FLARES)   Calcium Carbonate-Vitamin D 600-400 MG-UNIT tablet Take 1 tablet by mouth daily.   Carboxymethylcellulose Sodium (ARTIFICIAL TEARS OP) Place 1 drop into both eyes daily as needed (dry eyes).   cephALEXin (KEFLEX) 250 MG capsule Take 1 capsule (250 mg total) by mouth 4 (four) times daily for 7 days.   docusate sodium (COLACE) 250 MG capsule Take 250 mg by mouth 2 (two) times daily.   furosemide (LASIX) 20 MG tablet TAKE 1 TABLET BY MOUTH EVERY DAY   levothyroxine (SYNTHROID) 25 MCG tablet Take 1 tablet (25 mcg total) by mouth daily.   memantine (NAMENDA) 5 MG tablet Take 2.5 mg by mouth 2 (two) times daily.   metoprolol tartrate (LOPRESSOR) 25 MG tablet TAKE 1 TABLET BY MOUTH TWICE A DAY   mirtazapine (REMERON) 15 MG tablet Take 1 tablet (15 mg total) by mouth at bedtime.   Multiple Vitamin (MULTIVITAMIN) tablet Take 1 tablet by mouth daily.   [EXPIRED] leuprolide (6 Month) (ELIGARD) injection 45 mg    [DISCONTINUED] Leuprolide Acetate (6 Month) (LUPRON) injection 45 mg    No facility-administered encounter medications on file as of 04/09/2022.    Past Medical History:  Diagnosis Date   AAA (abdominal aortic aneurysm) ([place])    Remote ~1994    Anemia    Arthritis    Atrial fibrillation [facility])    Diagnosed December 2014   AVNRT (AV nodal re-entry tachycardia) ([place])    Possible   Cataract    Bilateral   Cirrhosis ([place])    CKD (chronic kidney disease) stage 3, GFR 30-59 ml/min (HCC)    Essential hypertension    Gout    History of GI bleed    History of gout    History of hepatitis    History of radiation therapy 07/18/2020-08/06/2020   L Lung SBRT; Dr. Gery Pray   History of stroke    Hyperlipidemia    PAD  (peripheral artery disease) ([place])    Stenting in lower  extremities (no records)   Prostate cancer ([place])    XRT; in remission   Type 2 diabetes mellitus ([place])     Past Surgical History:  Procedure Laterality Date   ABDOMINAL AORTIC ANEURYSM REPAIR  1980's   in [place] Right ~ [place], BILATERAL Bilateral 2014   CHOLECYSTECTOMY     COLON SURGERY     PARTIAL COLECTOMY     "I was bleeding to death inside; took out 2/3 of my colon" (08/22/2013)    Social History   Socioeconomic History   Marital status: Married    Spouse name: [name]   Number of children: 2   Years of education: Not on file   Highest education level: Not on file  Occupational History   Occupation: retired    Comment: [employer]  Tobacco Use   Smoking status: Former    Packs/day: 1.00    Years: 70.00    Total pack years: 70.00    Types: Cigarettes    Start date: 09/11/1944    Quit date: 02/17/2020    Years since quitting: 2.1   Smokeless tobacco: Former    Types: Snuff, Chew  Vaping Use   Vaping Use: Never used  Substance and Sexual Activity   Alcohol use: Not Currently    Alcohol/week: 0.0 standard drinks of alcohol    Comment: 08/22/2013 "drink ~ 1 beer and wine/yr"   Drug use: No   Sexual activity: Never  Other Topics Concern   Not on file  Social History Narrative   From [place]. Relocated to this area from [facility]   Social Determinants of Health   Financial Resource Strain: Low Risk  (07/28/2021)   Overall Financial Resource Strain (CARDIA)    Difficulty of Paying Living Expenses: Not hard at all  Food Insecurity: No Food Insecurity (07/28/2021)   Hunger Vital Sign    Worried About Running Out of Food in the Last Year: Never true    Ran Out of Food in the Last Year: Never true  Transportation Needs: No Transportation Needs (07/28/2021)   PRAPARE - Radiographer, therapeutic (Medical): No    Lack of Transportation (Non-Medical): No  Physical Activity: Inactive (07/28/2021)   Exercise Vital Sign    Days of Exercise per Week: 0 days    Minutes of Exercise per Session: 0 min  Stress: No Stress Concern Present (07/28/2021)   [place]    Feeling of Stress : Only a little  Social Connections: Socially Integrated (07/28/2021)   Social Connection and Isolation Panel [NHANES]    Frequency of Communication with Friends and Family: Never    Frequency of Social Gatherings with Friends and Family: More than three times a week    Attends Religious Services: More than 4 times per year    Active Member of [employer] or Organizations: Yes    Attends Archivist Meetings: 1 to 4 times per year    Marital Status: Married  Human resources officer Violence: Not At Risk (07/28/2021)   Humiliation, Afraid, Rape, and Kick questionnaire    Fear of Current or Ex-Partner: No    Emotionally Abused: No    Physically Abused: No    Sexually Abused: No    Family History  Problem Relation Age of Onset   Cancer Mother  uterine deceased age 63   Hypertension Sister    Arrhythmia Other        Atrial fibrillation   Hypertension Maternal Grandmother        Objective: Vitals:   04/09/22 1512  BP: (!) 142/84  Pulse: (!) 105  I checked his pulse manually and he has an IRR with some quick runs but the rate is variable.    Physical Exam Vitals reviewed.  Constitutional:      Appearance: Normal appearance.  Lymphadenopathy:     Cervical: No cervical adenopathy.     Upper Body:     Right upper body: No supraclavicular or axillary adenopathy.     Left upper body: No supraclavicular or axillary adenopathy.  Neurological:     Mental Status: He is alert.     Lab Results:  No results for input(s): "WBC", "HGB", "HCT", "PLT" in the last 72 hours. BMET No results for input(s): "NA", "K",  "CL", "CO2", "GLUCOSE", "BUN", "CREATININE", "CALCIUM" in the last 72 hours. PSA 1.5 04/02/20  0.8 12/20 0.4 in 3/20.    Lab Results  Component Value Date   PSA1 4.0 04/06/2022   PSA1 6.2 (H) 12/05/2021   PSA1 5.6 (H) 10/03/2021    Results for orders placed or performed in visit on 04/09/22 (from the past 24 hour(s))  Urinalysis, Routine w reflex microscopic     Status: None   Collection Time: 04/09/22  3:37 PM  Result Value Ref Range   Specific Gravity, UA 1.015 1.005 - 1.030   pH, UA 5.5 5.0 - 7.5   Color, UA Yellow Yellow   Appearance Ur Clear Clear   Leukocytes,UA Negative Negative   Protein,UA Negative Negative/Trace   Glucose, UA Negative Negative   Ketones, UA Negative Negative   RBC, UA Negative Negative   Bilirubin, UA Negative Negative   Urobilinogen, Ur 0.2 0.2 - 1.0 mg/dL   Nitrite, UA Negative Negative   Microscopic Examination Comment    Narrative   Performed at:  [facility] [address], [place], [place]  [id number] Lab Director: Mina Marble MT, Phone:  [phone]    UA is clear.  Results for orders placed or performed in visit on 04/09/22 (from the past 24 hour(s))  Urinalysis, Routine w reflex microscopic     Status: None   Collection Time: 04/09/22  3:37 PM  Result Value Ref Range   Specific Gravity, UA 1.015 1.005 - 1.030   pH, UA 5.5 5.0 - 7.5   Color, UA Yellow Yellow   Appearance Ur Clear Clear   Leukocytes,UA Negative Negative   Protein,UA Negative Negative/Trace   Glucose, UA Negative Negative   Ketones, UA Negative Negative   RBC, UA Negative Negative   Bilirubin, UA Negative Negative   Urobilinogen, Ur 0.2 0.2 - 1.0 mg/dL   Nitrite, UA Negative Negative   Microscopic Examination Comment    Narrative   Performed at:  [facility] [address] [place], [place]  [id number] Lab Director: Mina Marble MT, Phone:  [phone]      Studies/Results:       Assessment & Plan: 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  PMSA PET in 8/22 just showed local recurrence.  His PSA was actually down prior to this visit and he has no worrisome symptoms.  I will repeat the Lupron today and have him return in 6 months with a PSA.    BOO with frequency.   He  has mild/mod LUTS and is tolerating them well.       Orders Placed This Encounter  Procedures   Urinalysis, Routine w reflex microscopic   PSA    Standing Status:   Future    Standing Expiration Date:   04/10/2023   Testosterone    Standing Status:   Future    Standing Expiration Date:   04/10/2023      Return in about 6 months (around 10/10/2022) for with psa.     CC: Janora Norlander, DO and Dr. Derek Jack.     Erik Mullins 04/10/2022 Patient ID: Erik Mullins, male   DOB: 04-Nov-1930, 88 y.o.   MRN: [id number] Patient ID: Erik Mullins, male   DOB: 06-15-1931, 88 y.o.   MRN: [id number] Patient ID: Erik Mullins, male   DOB: April 27, 1931, 88 y.o.   MRN: [id number]

## 2022-04-09 NOTE — Progress Notes (Signed)
Eligard SubQ Injection   Due to Prostate Cancer patient is present today for a Eligard Injection.  Medication: Eligard 6 month Dose: 45 mg  Location: right  Lot: [id number]   Exp: 07/16/2023  Patient tolerated well, no complications were noted  Performed by: Estill Bamberg RN

## 2022-04-10 LAB — URINALYSIS, ROUTINE W REFLEX MICROSCOPIC
Bilirubin, UA: NEGATIVE
Glucose, UA: NEGATIVE
Ketones, UA: NEGATIVE
Leukocytes,UA: NEGATIVE
Nitrite, UA: NEGATIVE
Protein,UA: NEGATIVE
RBC, UA: NEGATIVE
Specific Gravity, UA: 1.015 (ref 1.005–1.030)
Urobilinogen, Ur: 0.2 mg/dL (ref 0.2–1.0)
pH, UA: 5.5 (ref 5.0–7.5)

## 2022-04-20 ENCOUNTER — Other Ambulatory Visit: Payer: Self-pay

## 2022-04-24 ENCOUNTER — Other Ambulatory Visit: Payer: Self-pay | Admitting: Family Medicine

## 2022-04-24 DIAGNOSIS — E034 Atrophy of thyroid (acquired): Secondary | ICD-10-CM

## 2022-04-24 DIAGNOSIS — J449 Chronic obstructive pulmonary disease, unspecified: Secondary | ICD-10-CM

## 2022-05-21 DIAGNOSIS — I10 Essential (primary) hypertension: Secondary | ICD-10-CM | POA: Diagnosis not present

## 2022-05-21 DIAGNOSIS — R42 Dizziness and giddiness: Secondary | ICD-10-CM | POA: Diagnosis not present

## 2022-06-08 ENCOUNTER — Other Ambulatory Visit: Payer: Self-pay | Admitting: Family Medicine

## 2022-06-08 DIAGNOSIS — J449 Chronic obstructive pulmonary disease, unspecified: Secondary | ICD-10-CM

## 2022-06-11 ENCOUNTER — Other Ambulatory Visit (HOSPITAL_COMMUNITY): Payer: Medicare Other

## 2022-06-11 ENCOUNTER — Other Ambulatory Visit: Payer: Medicare Other

## 2022-06-18 ENCOUNTER — Ambulatory Visit: Payer: Medicare Other | Admitting: Hematology

## 2022-07-13 ENCOUNTER — Encounter (INDEPENDENT_AMBULATORY_CARE_PROVIDER_SITE_OTHER): Payer: Self-pay

## 2022-07-13 ENCOUNTER — Other Ambulatory Visit: Payer: Self-pay | Admitting: Family Medicine

## 2022-07-13 DIAGNOSIS — L409 Psoriasis, unspecified: Secondary | ICD-10-CM

## 2022-08-05 ENCOUNTER — Other Ambulatory Visit: Payer: Self-pay | Admitting: Family Medicine

## 2022-08-10 ENCOUNTER — Encounter: Payer: Self-pay | Admitting: Family Medicine

## 2022-08-10 ENCOUNTER — Ambulatory Visit (INDEPENDENT_AMBULATORY_CARE_PROVIDER_SITE_OTHER): Payer: Medicare Other | Admitting: Family Medicine

## 2022-08-10 VITALS — BP 126/69 | HR 50 | Temp 97.6°F | Ht 70.5 in | Wt 177.8 lb

## 2022-08-10 DIAGNOSIS — F0150 Vascular dementia without behavioral disturbance: Secondary | ICD-10-CM

## 2022-08-10 DIAGNOSIS — F418 Other specified anxiety disorders: Secondary | ICD-10-CM

## 2022-08-10 DIAGNOSIS — J3489 Other specified disorders of nose and nasal sinuses: Secondary | ICD-10-CM | POA: Diagnosis not present

## 2022-08-10 DIAGNOSIS — N1832 Chronic kidney disease, stage 3b: Secondary | ICD-10-CM | POA: Diagnosis not present

## 2022-08-10 DIAGNOSIS — E034 Atrophy of thyroid (acquired): Secondary | ICD-10-CM | POA: Diagnosis not present

## 2022-08-10 DIAGNOSIS — Z23 Encounter for immunization: Secondary | ICD-10-CM

## 2022-08-10 MED ORDER — AZELASTINE HCL 0.1 % NA SOLN: 1.0000 | Freq: Two times a day (BID) | NASAL | 12 refills | Status: DC

## 2022-08-10 NOTE — Patient Instructions (Addendum)
I'll check with Almyra Free to see if we can get some Anoro samples for you. Don't forget to come and get Dr Ralene Muskrat labs in January.  He has these preordered for you.

## 2022-08-10 NOTE — Progress Notes (Signed)
Subjective: CC: Follow-up hypothyroidism, dementia PCP: Janora Norlander, DO JKD:Erik Mullins is a 88 y.o. male presenting to clinic today for:  1.  Dementia Patient's wife reports that he seems to be sundowning more in the evening time.  Does not report any hallucinations.  Has not noticed a great deal of change in his mood with the increased dose of mirtazapine.  She denies any violent behavior or concerns for safety at home.  2.  Hypothyroidism Compliant with Synthroid 25 mcg daily.  No reports of tremor or change in bowel habits  3.  Rhinorrhea Patient complains of rhinorrhea and frequent need to blow his nose.  Denies any sore throat, ear fullness.  Not on any antihistamines and not using any nasal sprays.  4.  A-fib, CKD 3 Compliant with medications.  Getting the Eliquis through the [place] and not having any difficulty with this.  Does not report any chest pain or fluid overload.  Urine output remains normal.  Could you some samples of Anoro as this was several $100 last refill   ROS: Per HPI  Allergies  Allergen Reactions   Other Other (See Comments)   Morphine Other (See Comments)    hallucinate   Past Medical History:  Diagnosis Date   AAA (abdominal aortic aneurysm) ([place])    Remote ~1994    Anemia    Arthritis    Atrial fibrillation [facility])    Diagnosed December 2014   AVNRT (AV nodal re-entry tachycardia)    Possible   Cataract    Bilateral   Cirrhosis ([place])    CKD (chronic kidney disease) stage 3, GFR 30-59 ml/min (HCC)    Essential hypertension    Gout    History of GI bleed    History of gout    History of hepatitis    History of radiation therapy 07/18/2020-08/06/2020   L Lung SBRT; Dr. Gery Pray   History of stroke    Hyperlipidemia    PAD (peripheral artery disease) ([place])    Stenting in lower extremities (no records)   Prostate cancer ([place])    XRT; in remission   Type 2 diabetes mellitus ([place])     Current Outpatient Medications:     acetaminophen (TYLENOL) 500 MG tablet, Take 1,000 mg by mouth every 6 (six) hours as needed for headache., Disp: , Rfl:    albuterol (VENTOLIN HFA) 108 (90 Base) MCG/ACT inhaler, Inhale 1-2 puffs into the lungs every 6 (six) hours as needed for wheezing or shortness of breath. (Patient taking differently: Inhale 2 puffs into the lungs every 6 (six) hours as needed for wheezing or shortness of breath.), Disp: 18 g, Rfl: 2   allopurinol (ZYLOPRIM) 100 MG tablet, Take 1 tablet (100 mg total) by mouth daily., Disp: 90 tablet, Rfl: 3   ANORO ELLIPTA 62.5-25 MCG/ACT AEPB, INHALE 1 PUFF BY MOUTH EVERY DAY, Disp: 60 each, Rfl: 3   apixaban (ELIQUIS) 5 MG TABS tablet, Take 2.5 mg by mouth 2 (two) times daily., Disp: , Rfl:    betamethasone valerate ointment (VALISONE) 0.1 %, APPLY TOPICALLY 2TIMES DAILY. AVOID FACE, AXILLA AND GROIN. (USE AS NEEDED FOR PSORIASIS FLARES), Disp: 90 g, Rfl: 1   Calcium Carbonate-Vitamin D 600-400 MG-UNIT tablet, Take 1 tablet by mouth daily., Disp: , Rfl:    Carboxymethylcellulose Sodium (ARTIFICIAL TEARS OP), Place 1 drop into both eyes daily as needed (dry eyes)., Disp: , Rfl:    docusate sodium (COLACE) 250 MG capsule, Take 250 mg by  mouth 2 (two) times daily., Disp: , Rfl:    furosemide (LASIX) 20 MG tablet, TAKE 1 TABLET BY MOUTH EVERY DAY, Disp: 90 tablet, Rfl: 1   levothyroxine (SYNTHROID) 25 MCG tablet, TAKE 1 TABLET BY MOUTH EVERY DAY, Disp: 90 tablet, Rfl: 0   memantine (NAMENDA) 5 MG tablet, Take 2.5 mg by mouth 2 (two) times daily., Disp: , Rfl:    metoprolol tartrate (LOPRESSOR) 25 MG tablet, TAKE 1 TABLET BY MOUTH TWICE A DAY, Disp: 180 tablet, Rfl: 3   mirtazapine (REMERON) 15 MG tablet, Take 1 tablet (15 mg total) by mouth at bedtime., Disp: 90 tablet, Rfl: 3   Multiple Vitamin (MULTIVITAMIN) tablet, Take 1 tablet by mouth daily., Disp: , Rfl:  Social History   Socioeconomic History   Marital status: Married    Spouse name: [name]   Number of children: 2    Years of education: Not on file   Highest education level: Not on file  Occupational History   Occupation: retired    Comment: [employer]  Tobacco Use   Smoking status: Former    Packs/day: 1.00    Years: 70.00    Total pack years: 70.00    Types: Cigarettes    Start date: 09/11/1944    Quit date: 02/17/2020    Years since quitting: 2.4   Smokeless tobacco: Former    Types: Snuff, Chew  Vaping Use   Vaping Use: Never used  Substance and Sexual Activity   Alcohol use: Not Currently    Alcohol/week: 0.0 standard drinks of alcohol    Comment: 08/22/2013 "drink ~ 1 beer and wine/yr"   Drug use: No   Sexual activity: Never  Other Topics Concern   Not on file  Social History Narrative   From [place]. Relocated to this area from [facility].   Social Determinants of Health   Financial Resource Strain: Low Risk  (07/28/2021)   Overall Financial Resource Strain (CARDIA)    Difficulty of Paying Living Expenses: Not hard at all  Food Insecurity: No Food Insecurity (07/28/2021)   Hunger Vital Sign    Worried About Running Out of Food in the Last Year: Never true    Ran Out of Food in the Last Year: Never true  Transportation Needs: No Transportation Needs (07/28/2021)   PRAPARE - Hydrologist (Medical): No    Lack of Transportation (Non-Medical): No  Physical Activity: Inactive (07/28/2021)   Exercise Vital Sign    Days of Exercise per Week: 0 days    Minutes of Exercise per Session: 0 min  Stress: No Stress Concern Present (07/28/2021)   [place]    Feeling of Stress : Only a little  Social Connections: Socially Integrated (07/28/2021)   Social Connection and Isolation Panel [NHANES]    Frequency of Communication with Friends and Family: Never    Frequency of Social Gatherings with Friends and Family: More than three times a week    Attends Religious Services: More than 4  times per year    Active Member of [employer] or Organizations: Yes    Attends Archivist Meetings: 1 to 4 times per year    Marital Status: Married  Human resources officer Violence: Not At Risk (07/28/2021)   Humiliation, Afraid, Rape, and Kick questionnaire    Fear of Current or Ex-Partner: No    Emotionally Abused: No    Physically Abused: No    Sexually Abused:  No   Family History  Problem Relation Age of Onset   Cancer Mother        uterine deceased age 29   Hypertension Sister    Arrhythmia Other        Atrial fibrillation   Hypertension Maternal Grandmother     Objective: Office vital signs reviewed. BP 126/69   Pulse (!) 50   Temp 97.6 F (36.4 C)   Ht 5' 10.5" (1.791 m)   Wt 177 lb 12.8 oz (80.6 kg)   SpO2 96%   BMI 25.15 kg/m   Physical Examination:  General: Awake, alert, well-appearing elderly male, No acute distress HEENT: sclera white, MMM. Head with scaley lesion at the left posterior lateral aspect of the scalp.  TMs intact bilaterally without any appreciable cerumen in the external auditory canal.  Nasal turbinates are moist and pink.  No significant drainage appreciated. Cardio: bradycardic with seemingly regular rhythm, S1S2 heard, no murmurs appreciated Pulm: clear to auscultation bilaterally, no wheezes, rhonchi or rales; normal work of breathing on room air Skin: dry; intact; no rashes or lesions Neuro: hard of hearing, wears hearing aids. Follows commands Psych: pleasant, does not appear to be responding to internal stimuli     08/10/2022    9:55 AM 04/06/2022   11:02 AM 01/05/2022   11:17 AM  Depression screen PHQ 2/9  Decreased Interest 3 3 0  Down, Depressed, Hopeless 3 3 0  PHQ - 2 Score 6 6 0  Altered sleeping 3 0 0  Tired, decreased energy 3 3 0  Change in appetite 3 2 0  Feeling bad or failure about yourself  3 3 0  Trouble concentrating 3 3 0  Moving slowly or fidgety/restless 3 3 0  Suicidal thoughts 2 2 0  PHQ-9 Score 26 22 0   Difficult doing work/chores Somewhat difficult Somewhat difficult Not difficult at all      08/10/2022    9:55 AM 04/06/2022   11:02 AM 01/05/2022   11:16 AM 10/06/2021   10:28 AM  GAD 7 : Generalized Anxiety Score  Nervous, Anxious, on Edge 3 0 0 1  Control/stop worrying 0 2 0 2  Worry too much - different things 3 2 0 2  Trouble relaxing 0 0 0 2  Restless 0 0 0 0  Easily annoyed or irritable 3 2 0 3  Afraid - awful might happen 0 0 0 0  Total GAD 7 Score 9 6 0 10  Anxiety Difficulty Somewhat difficult Somewhat difficult Not difficult at all Very difficult       Assessment/ Plan: 88 y.o. male   Vascular dementia without behavioral disturbance (HCC)  Stage 3b chronic kidney disease ([place]) - Plan: CMP14+EGFR, CBC, VITAMIN D 25 Hydroxy (Vit-D Deficiency, Fractures)  Hypothyroidism due to acquired atrophy of thyroid - Plan: TSH, T4, Free  Need for immunization against influenza - Plan: Flu Vaccine QUAD High Dose(Fluad)  Depression with anxiety  Rhinorrhea - Plan: azelastine (ASTELIN) 0.1 % nasal spray  Having increasing sundowning so would like to see him in 4 months rather than 6.  He is on Namenda at max dose for renal function  Check renal function, vitamin D, CBC given known CKD 3B  Check thyroid levels  Influenza vaccination administered  PHQ and GAD-7 completed by his wife, symptoms appear to be worse than previous but declined any increase in the mirtazapine at this time  Astelin nasal spray sent for rhinorrhea.  Could consider addition of oral antihistamine  but this would certainly need to be adjusted for renal function  Orders Placed This Encounter  Procedures   TSH   T4, Free   CMP14+EGFR   PSA    Order Specific Question:   CC Results    Answer:   WRENN, Terris [[id number]]   CBC   VITAMIN D 25 Hydroxy (Vit-D Deficiency, Fractures)   No orders of the defined types were placed in this encounter.    Janora Norlander, DO [place] [phone]

## 2022-08-11 LAB — CMP14+EGFR
ALT: 6 IU/L (ref 0–44)
AST: 17 IU/L (ref 0–40)
Albumin/Globulin Ratio: 1.6 (ref 1.2–2.2)
Albumin: 4.1 g/dL (ref 3.7–4.7)
Alkaline Phosphatase: 116 IU/L (ref 44–121)
BUN/Creatinine Ratio: 15 (ref 10–24)
BUN: 31 mg/dL — ABNORMAL HIGH (ref 8–27)
Bilirubin Total: 0.2 mg/dL (ref 0.0–1.2)
CO2: 25 mmol/L (ref 20–29)
Calcium: 10 mg/dL (ref 8.6–10.2)
Chloride: 102 mmol/L (ref 96–106)
Creatinine, Ser: 2.11 mg/dL — ABNORMAL HIGH (ref 0.76–1.27)
Globulin, Total: 2.6 g/dL (ref 1.5–4.5)
Glucose: 97 mg/dL (ref 70–99)
Potassium: 4.3 mmol/L (ref 3.5–5.2)
Sodium: 143 mmol/L (ref 134–144)
Total Protein: 6.7 g/dL (ref 6.0–8.5)
eGFR: 30 mL/min/{1.73_m2} — ABNORMAL LOW (ref >59)

## 2022-08-11 LAB — TSH: TSH: 3.86 u[IU]/mL (ref 0.450–4.500)

## 2022-08-11 LAB — T4, FREE: Free T4: 1.21 ng/dL (ref 0.82–1.77)

## 2022-08-11 LAB — VITAMIN D 25 HYDROXY (VIT D DEFICIENCY, FRACTURES): Vit D, 25-Hydroxy: 32.4 ng/mL (ref 30.0–100.0)

## 2022-08-11 LAB — CBC
Hematocrit: 40.9 % (ref 37.5–51.0)
Hemoglobin: 14 g/dL (ref 13.0–17.7)
MCH: 30.7 pg (ref 26.6–33.0)
MCHC: 34.2 g/dL (ref 31.5–35.7)
MCV: 90 fL (ref 79–97)
Platelets: 167 10*3/uL (ref 150–450)
RBC: 4.56 x10E6/uL (ref 4.14–5.80)
RDW: 13.1 % (ref 11.6–15.4)
WBC: 7.1 10*3/uL (ref 3.4–10.8)

## 2022-08-21 ENCOUNTER — Other Ambulatory Visit: Payer: Self-pay | Admitting: Cardiology

## 2022-09-17 ENCOUNTER — Other Ambulatory Visit: Payer: Self-pay | Admitting: Cardiology

## 2022-09-17 ENCOUNTER — Other Ambulatory Visit: Payer: Self-pay | Admitting: Family Medicine

## 2022-09-17 DIAGNOSIS — E034 Atrophy of thyroid (acquired): Secondary | ICD-10-CM

## 2022-09-21 ENCOUNTER — Other Ambulatory Visit: Payer: Self-pay | Admitting: Family Medicine

## 2022-09-21 ENCOUNTER — Telehealth: Payer: Self-pay | Admitting: Family Medicine

## 2022-09-21 DIAGNOSIS — F418 Other specified anxiety disorders: Secondary | ICD-10-CM

## 2022-09-21 MED ORDER — MIRTAZAPINE 30 MG PO TABS: 30.0000 mg | ORAL_TABLET | Freq: Every day | ORAL | 3 refills | Status: DC

## 2022-09-21 NOTE — Telephone Encounter (Signed)
We discussed raising the Mirtazapine for MOOD but he is on MAX dose of Namenda for renal function.

## 2022-09-21 NOTE — Telephone Encounter (Signed)
30mg  sent.

## 2022-09-21 NOTE — Telephone Encounter (Signed)
She is referring to the mirtazapine for his mood

## 2022-09-21 NOTE — Telephone Encounter (Signed)
PT WIFE AWARE

## 2022-09-22 ENCOUNTER — Telehealth: Payer: Self-pay | Admitting: Family Medicine

## 2022-09-22 NOTE — Telephone Encounter (Signed)
Patient declined the Medicare Wellness Visit with [facility]  Wife stated due to his dementia.  She declined doing on phone for him, but said they could do in office

## 2022-10-02 ENCOUNTER — Other Ambulatory Visit: Payer: Medicare Other

## 2022-10-02 DIAGNOSIS — C61 Malignant neoplasm of prostate: Secondary | ICD-10-CM

## 2022-10-03 LAB — TESTOSTERONE: Testosterone: 4 ng/dL — ABNORMAL LOW (ref 264–916)

## 2022-10-03 LAB — PSA: Prostate Specific Ag, Serum: 5.1 ng/mL — ABNORMAL HIGH (ref 0.0–4.0)

## 2022-10-08 ENCOUNTER — Encounter: Payer: Self-pay | Admitting: Urology

## 2022-10-08 ENCOUNTER — Ambulatory Visit (INDEPENDENT_AMBULATORY_CARE_PROVIDER_SITE_OTHER): Payer: Medicare Other | Admitting: Urology

## 2022-10-08 VITALS — BP 142/83 | HR 100

## 2022-10-08 DIAGNOSIS — R9721 Rising PSA following treatment for malignant neoplasm of prostate: Secondary | ICD-10-CM | POA: Diagnosis not present

## 2022-10-08 DIAGNOSIS — C61 Malignant neoplasm of prostate: Secondary | ICD-10-CM | POA: Diagnosis not present

## 2022-10-08 DIAGNOSIS — R351 Nocturia: Secondary | ICD-10-CM

## 2022-10-08 DIAGNOSIS — R350 Frequency of micturition: Secondary | ICD-10-CM | POA: Diagnosis not present

## 2022-10-08 MED ORDER — LEUPROLIDE ACETATE (6 MONTH) 45 MG ~~LOC~~ KIT
45.0000 mg | PACK | Freq: Once | SUBCUTANEOUS | Status: AC
  Administered 2022-10-08: 45 mg via SUBCUTANEOUS

## 2022-10-08 MED ORDER — LEUPROLIDE ACETATE (6 MONTH) 45 MG IM KIT: 45.0000 mg | PACK | Freq: Once | INTRAMUSCULAR | Status: DC

## 2022-10-08 NOTE — Progress Notes (Signed)
Eligard SubQ Injection   Due to Prostate Cancer patient is present today for a Eligard Injection.  Medication: Eligard 6 month Dose: 45 mg  Location: left  Lot: [id number] Exp: [id number]  Patient tolerated well, no complications were noted  Performed by: Marisue Brooklyn, CMA

## 2022-10-08 NOTE — Progress Notes (Signed)
Subjective:  1. Prostate cancer ([place])   2. Rising PSA following treatment for malignant neoplasm of prostate   3. Urinary frequency   4. Nocturia    10/08/22: Erik Mullins returns today for his history of NM CRCP that is managed with Lupron with the last dose on 04/09/22.  His PSA is up to 5.2 from 4 at the last visit and his testosterone remains castrate.  His weight is table.  He has no new bone pain.  His appetite is down.   04/09/22: Erik Mullins returns today in f/u for his history of NM CRCP.  He is due for Lupron today with the last dose on 10/09/21.  His PSA is back down some to 4 after rising to 6.2 in 3/23.  He is voiding ok but has frequency and some straining to void.  He has minimal nocturia. His weight is stable.  He has no bone pain.  His Cr was 1.88 in March and he is doing well with the lung cancer.  He has some dementia but is doing better with an increased dose of mirtazapine.   10/09/21: Erik Mullins returns today in f/u.  He has NM CRCP.  His last Lupron was on 04/10/21.  His PSA continues to rise slowly and was 5.6 on 10/03/21 which was up from 4.4 on 07/04/21 and 3.2 on 04/02/21.  He also has lung CA treated with SBRT in 11/21 and CKD 3b with a Cr of 2.2 on 10/04/21 which is in his usual range.  He had a PMSA PET scan in 8/22 and had only evidence of local recurrence in the right prostate. He is voiding with some frequency but he sits to void.  His IPSS is 10.  He has no bone pain but he has arthralgias.  He has stable weight.  He has some fecal urgency but no other GI complaints.   04/10/21: Erik Mullins returns today in f/u for his history of NM CRCP.  His last Lupron was on 10/03/20.   His PSA  is back up to 3.2 from 2.26 on 02/11/21 and 1.7 on 01/01/21.  The testosterone level remains low at <3.     He had a Chest CT on 02/11/21 that was stable.   His UA is clear today.   He is voiding well but has some frequency with a diuretic.  He has nocturia x 1.   He has had no hematuria.  He has had no weight loss.  He has no  bone pain.   He has some chest discomfort that is felt to be from the lung radiation therapy.    08/23/20: Erik Mullins returns today in f/u.  He was found to have lung cancer at his last visit and has subsequently been treated with radiation therapy.    His PSA continues to rise and was 2.0 on 08/20/20 which is up from 1.5 in 7/21 and 0.8 in 12/20.  He T was castrate in 04/02/20.  He is voiding well but has stable nocturia 2-3x.  He has no bone pain or weight loss.    05/24/20: Erik Mullins returns today in f/u from an [place] PET done for his history of prostate cancer that was diagnosed in 4/10 and he completed radiation therapy on 08/16/09.  He had gleason 9 disease intiallly.  He had a PSA recurrence and was started on Lupron in 2017 after his PSA rose to 9.9 in 6/17 from 3.6 in 12/16.  A bone scan and CT in 7/17 showed no mets.  His PSA nadired at 0.2 on Lupron in 9/18 but has been rising and was 0.4 in 3/20, 0.8 in 12/20 and 1.5 on 04/02/20.  His testosterone has remained castrate.  His last Lupron injection was on 04/05/20.   He is doing well without other associated signs or symptoms.  He is voiding well with an IPSS of 3-4 with nocturia x 2-3.  He drinks a lot of coffee prior to bed.  He has no bone pain or weight loss.   He has no hematuria.     The PET scan showed a 1cm nodule in the left upper lobe that was hypermetabolic and could be prostatic or a bronchogenic carcinoma.  Tissue sampling was recommended.  There was some diffuse uptake in the prostate as well.     IPSS     Row Name 10/08/22 1500         International Prostate Symptom Score   How often have you had the sensation of not emptying your bladder? Not at All     How often have you had to urinate less than every two hours? About half the time     How often have you found you stopped and started again several times when you urinated? About half the time     How often have you found it difficult to postpone urination? Not at All     How often  have you had a weak urinary stream? About half the time     How often have you had to strain to start urination? About half the time     How many times did you typically get up at night to urinate? 1 Time     Total IPSS Score 13       Quality of Life due to urinary symptoms   If you were to spend the rest of your life with your urinary condition just the way it is now how would you feel about that? Mostly Satisfied                ROS:  Review of Systems  Psychiatric/Behavioral:  Positive for memory loss.   All other systems reviewed and are negative.   Allergies  Allergen Reactions   Other Other (See Comments)   Morphine Other (See Comments)    hallucinate    Outpatient Encounter Medications as of 10/08/2022  Medication Sig   acetaminophen (TYLENOL) 500 MG tablet Take 1,000 mg by mouth every 6 (six) hours as needed for headache.   albuterol (VENTOLIN HFA) 108 (90 Base) MCG/ACT inhaler Inhale 1-2 puffs into the lungs every 6 (six) hours as needed for wheezing or shortness of breath. (Patient taking differently: Inhale 2 puffs into the lungs every 6 (six) hours as needed for wheezing or shortness of breath.)   allopurinol (ZYLOPRIM) 100 MG tablet Take 1 tablet (100 mg total) by mouth daily.   ANORO ELLIPTA 62.5-25 MCG/ACT AEPB INHALE 1 PUFF BY MOUTH EVERY DAY   apixaban (ELIQUIS) 5 MG TABS tablet Take 2.5 mg by mouth 2 (two) times daily.   azelastine (ASTELIN) 0.1 % nasal spray Place 1 spray into both nostrils 2 (two) times daily.   betamethasone valerate ointment (VALISONE) 0.1 % APPLY TOPICALLY 2TIMES DAILY. AVOID FACE, AXILLA AND GROIN. (USE AS NEEDED FOR PSORIASIS FLARES)   Calcium Carbonate-Vitamin D 600-400 MG-UNIT tablet Take 1 tablet by mouth daily.   Carboxymethylcellulose Sodium (ARTIFICIAL TEARS OP) Place 1 drop into both eyes daily as needed (dry eyes).  docusate sodium (COLACE) 250 MG capsule Take 250 mg by mouth 2 (two) times daily.   furosemide (LASIX) 20 MG  tablet TAKE 1 TABLET BY MOUTH EVERY DAY   levothyroxine (SYNTHROID) 25 MCG tablet TAKE 1 TABLET BY MOUTH EVERY DAY   memantine (NAMENDA) 5 MG tablet Take 2.5 mg by mouth 2 (two) times daily.   metoprolol tartrate (LOPRESSOR) 25 MG tablet TAKE 1 TABLET BY MOUTH TWICE A DAY   mirtazapine (REMERON) 30 MG tablet Take 1 tablet (30 mg total) by mouth at bedtime.   Multiple Vitamin (MULTIVITAMIN) tablet Take 1 tablet by mouth daily.   [EXPIRED] leuprolide (6 Month) (ELIGARD) injection 45 mg    [DISCONTINUED] Leuprolide Acetate (6 Month) (LUPRON) injection 45 mg    No facility-administered encounter medications on file as of 10/08/2022.    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 Gery Pray   History of stroke    Hyperlipidemia    PAD (peripheral artery disease) ([place])    Stenting in lower extremities (no records)   Prostate cancer ([place])    XRT; in remission   Type 2 diabetes mellitus ([place])     Past Surgical History:  Procedure Laterality Date   ABDOMINAL AORTIC ANEURYSM REPAIR  1980's   in [place] Right ~ [place], BILATERAL Bilateral 2014   CHOLECYSTECTOMY     COLON SURGERY     PARTIAL COLECTOMY     "I was bleeding to death inside; took out 2/3 of my colon" (08/22/2013)    Social History   Socioeconomic History   Marital status: Married    Spouse name: [name]   Number of children: 2   Years of education: Not on  file   Highest education level: Not on file  Occupational History   Occupation: retired    Comment: [employer]  Tobacco Use   Smoking status: Former    Packs/day: 1.00    Years: 70.00    Total pack years: 70.00    Types: Cigarettes    Start date: 09/11/1944    Quit date: 02/17/2020    Years since quitting: 2.6   Smokeless tobacco: Former    Types: Snuff, Chew  Vaping Use   Vaping Use: Never used  Substance and Sexual Activity   Alcohol use: Not Currently    Alcohol/week: 0.0 standard drinks of alcohol    Comment: 08/22/2013 "drink ~ 1 beer and wine/yr"   Drug use: No   Sexual activity: Never  Other Topics Concern   Not on file  Social History Narrative   From [place]. Relocated to this area from [facility].   Social Determinants of Health   Financial Resource Strain: Low Risk  (07/28/2021)   Overall Financial Resource Strain (CARDIA)    Difficulty of  Paying Living Expenses: Not hard at all  Food Insecurity: No Food Insecurity (07/28/2021)   Hunger Vital Sign    Worried About Running Out of Food in the Last Year: Never true    Ran Out of Food in the Last Year: Never true  Transportation Needs: No Transportation Needs (07/28/2021)   PRAPARE - Hydrologist (Medical): No    Lack of Transportation (Non-Medical): No  Physical Activity: Inactive (07/28/2021)   Exercise Vital Sign    Days of Exercise per Week: 0 days    Minutes of Exercise per Session: 0 min  Stress: No Stress Concern Present (07/28/2021)   [place]    Feeling of Stress : Only a little  Social Connections: Socially Integrated (07/28/2021)   Social Connection and Isolation Panel [NHANES]    Frequency of Communication with Friends and Family: Never    Frequency of Social Gatherings with Friends and Family: More than three times a week    Attends Religious Services: More than 4 times per year    Active  Member of [employer] or Organizations: Yes    Attends Archivist Meetings: 1 to 4 times per year    Marital Status: Married  Human resources officer Violence: Not At Risk (07/28/2021)   Humiliation, Afraid, Rape, and Kick questionnaire    Fear of Current or Ex-Partner: No    Emotionally Abused: No    Physically Abused: No    Sexually Abused: No    Family History  Problem Relation Age of Onset   Cancer Mother        uterine deceased age 30   Hypertension Sister    Arrhythmia Other        Atrial fibrillation   Hypertension Maternal Grandmother        Objective: Vitals:   10/08/22 1459  BP: (!) 142/83  Pulse: 100  I checked his pulse manually and he has an IRR with some quick runs but the rate is variable.    Physical Exam Vitals reviewed.  Constitutional:      Appearance: Normal appearance.  Lymphadenopathy:     Cervical: No cervical adenopathy.     Upper Body:     Right upper body: No supraclavicular or axillary adenopathy.     Left upper body: No supraclavicular or axillary adenopathy.  Neurological:     Mental Status: He is alert.     Lab Results:  No results for input(s): "WBC", "HGB", "HCT", "PLT" in the last 72 hours. BMET No results for input(s): "NA", "K", "CL", "CO2", "GLUCOSE", "BUN", "CREATININE", "CALCIUM" in the last 72 hours. PSA 1.5 04/02/20  0.8 12/20 0.4 in 3/20.    Lab Results  Component Value Date   PSA1 5.1 (H) 10/02/2022   PSA1 4.0 04/06/2022   PSA1 6.2 (H) 12/05/2021    No results found for this or any previous visit (from the past 24 hour(s)).    No results found for this or any previous visit (from the past 24 hour(s)).     Studies/Results:       Assessment & Plan: Castrate resistant prostate cancer. His PSA is up from the last but it has been variable on Lupron.  PMSA PET in 8/22 just showed local recurrence.  His PSA was actually down prior to this visit and he has no worrisome symptoms.  I will repeat the Lupron today  and have him return in 6 months with a PSA.  BOO with frequency.   He has mild/mod LUTS and is tolerating them well.       Orders Placed This Encounter  Procedures   Urinalysis, Routine w reflex microscopic   PSA    Standing Status:   Future    Standing Expiration Date:   10/09/2023      Return in about 6 months (around 04/08/2023) for with PSA.     CC: Janora Norlander, DO and Dr. Derek Jack.     Migel Hannis 10/09/2022 Patient ID: Erik Mullins, male   DOB: [birth year], 89 y.o.   MRN: [id number]

## 2022-10-15 ENCOUNTER — Ambulatory Visit: Payer: Medicare Other | Admitting: Family Medicine

## 2022-10-16 DIAGNOSIS — B356 Tinea cruris: Secondary | ICD-10-CM | POA: Diagnosis not present

## 2022-10-17 DIAGNOSIS — L02818 Cutaneous abscess of other sites: Secondary | ICD-10-CM | POA: Diagnosis not present

## 2022-10-21 DIAGNOSIS — Z5189 Encounter for other specified aftercare: Secondary | ICD-10-CM | POA: Diagnosis not present

## 2022-11-26 ENCOUNTER — Telehealth: Payer: Self-pay | Admitting: Family Medicine

## 2022-11-26 NOTE — Telephone Encounter (Signed)
Pt's wife would like to come in sooner than the appt on 3/27 due to not being able to get around, or eating well. She would like to discuss needed medical devices to help in get around.  Dr. Lajuana Ripple had an opening on 3/22. 77mappt made. Advised that pt may need to keep his 451mollow up.

## 2022-11-29 ENCOUNTER — Encounter (HOSPITAL_COMMUNITY): Payer: Self-pay

## 2022-11-29 ENCOUNTER — Inpatient Hospital Stay (HOSPITAL_COMMUNITY)
Admission: RE | Admit: 2022-11-29 | Discharge: 2022-11-30 | DRG: 682 | Disposition: A | Discharge status: Hospice | Payer: Medicare Other | Source: Other Acute Inpatient Hospital | Attending: Internal Medicine | Admitting: Internal Medicine

## 2022-11-29 ENCOUNTER — Encounter: Payer: Self-pay | Admitting: Internal Medicine

## 2022-11-29 DIAGNOSIS — Y92009 Unspecified place in unspecified non-institutional (private) residence as the place of occurrence of the external cause: Secondary | ICD-10-CM

## 2022-11-29 DIAGNOSIS — I509 Heart failure, unspecified: Secondary | ICD-10-CM | POA: Diagnosis not present

## 2022-11-29 DIAGNOSIS — E860 Dehydration: Secondary | ICD-10-CM | POA: Diagnosis not present

## 2022-11-29 DIAGNOSIS — S51011A Laceration without foreign body of right elbow, initial encounter: Secondary | ICD-10-CM | POA: Diagnosis not present

## 2022-11-29 DIAGNOSIS — C61 Malignant neoplasm of prostate: Secondary | ICD-10-CM

## 2022-11-29 DIAGNOSIS — I480 Paroxysmal atrial fibrillation: Secondary | ICD-10-CM | POA: Diagnosis present

## 2022-11-29 DIAGNOSIS — N179 Acute kidney failure, unspecified: Principal | ICD-10-CM | POA: Diagnosis present

## 2022-11-29 DIAGNOSIS — M25521 Pain in right elbow: Secondary | ICD-10-CM | POA: Diagnosis not present

## 2022-11-29 DIAGNOSIS — R52 Pain, unspecified: Secondary | ICD-10-CM | POA: Diagnosis not present

## 2022-11-29 DIAGNOSIS — J449 Chronic obstructive pulmonary disease, unspecified: Secondary | ICD-10-CM | POA: Diagnosis present

## 2022-11-29 DIAGNOSIS — G9341 Metabolic encephalopathy: Secondary | ICD-10-CM | POA: Diagnosis not present

## 2022-11-29 DIAGNOSIS — Z66 Do not resuscitate: Secondary | ICD-10-CM | POA: Diagnosis present

## 2022-11-29 DIAGNOSIS — E039 Hypothyroidism, unspecified: Secondary | ICD-10-CM | POA: Diagnosis present

## 2022-11-29 DIAGNOSIS — Z961 Presence of intraocular lens: Secondary | ICD-10-CM | POA: Diagnosis present

## 2022-11-29 DIAGNOSIS — I739 Peripheral vascular disease, unspecified: Secondary | ICD-10-CM

## 2022-11-29 DIAGNOSIS — R404 Transient alteration of awareness: Secondary | ICD-10-CM | POA: Diagnosis not present

## 2022-11-29 DIAGNOSIS — I4891 Unspecified atrial fibrillation: Secondary | ICD-10-CM | POA: Diagnosis present

## 2022-11-29 DIAGNOSIS — S2241XA Multiple fractures of ribs, right side, initial encounter for closed fracture: Secondary | ICD-10-CM | POA: Diagnosis not present

## 2022-11-29 DIAGNOSIS — Z7951 Long term (current) use of inhaled steroids: Secondary | ICD-10-CM

## 2022-11-29 DIAGNOSIS — Z6823 Body mass index (BMI) 23.0-23.9, adult: Secondary | ICD-10-CM

## 2022-11-29 DIAGNOSIS — F419 Anxiety disorder, unspecified: Secondary | ICD-10-CM | POA: Diagnosis not present

## 2022-11-29 DIAGNOSIS — R531 Weakness: Secondary | ICD-10-CM | POA: Diagnosis not present

## 2022-11-29 DIAGNOSIS — E1151 Type 2 diabetes mellitus with diabetic peripheral angiopathy without gangrene: Secondary | ICD-10-CM | POA: Diagnosis present

## 2022-11-29 DIAGNOSIS — N1831 Chronic kidney disease, stage 3a: Secondary | ICD-10-CM | POA: Diagnosis present

## 2022-11-29 DIAGNOSIS — Z7901 Long term (current) use of anticoagulants: Secondary | ICD-10-CM

## 2022-11-29 DIAGNOSIS — Z889 Allergy status to unspecified drugs, medicaments and biological substances status: Secondary | ICD-10-CM

## 2022-11-29 DIAGNOSIS — Z8546 Personal history of malignant neoplasm of prostate: Secondary | ICD-10-CM

## 2022-11-29 DIAGNOSIS — Z923 Personal history of irradiation: Secondary | ICD-10-CM

## 2022-11-29 DIAGNOSIS — E1122 Type 2 diabetes mellitus with diabetic chronic kidney disease: Secondary | ICD-10-CM | POA: Diagnosis present

## 2022-11-29 DIAGNOSIS — M109 Gout, unspecified: Secondary | ICD-10-CM | POA: Diagnosis present

## 2022-11-29 DIAGNOSIS — E785 Hyperlipidemia, unspecified: Secondary | ICD-10-CM | POA: Diagnosis present

## 2022-11-29 DIAGNOSIS — G9349 Other encephalopathy: Secondary | ICD-10-CM | POA: Diagnosis not present

## 2022-11-29 DIAGNOSIS — W06XXXA Fall from bed, initial encounter: Secondary | ICD-10-CM | POA: Diagnosis present

## 2022-11-29 DIAGNOSIS — F0150 Vascular dementia without behavioral disturbance: Secondary | ICD-10-CM | POA: Diagnosis present

## 2022-11-29 DIAGNOSIS — Z8249 Family history of ischemic heart disease and other diseases of the circulatory system: Secondary | ICD-10-CM

## 2022-11-29 DIAGNOSIS — Z79818 Long term (current) use of other agents affecting estrogen receptors and estrogen levels: Secondary | ICD-10-CM

## 2022-11-29 DIAGNOSIS — R627 Adult failure to thrive: Secondary | ICD-10-CM | POA: Diagnosis present

## 2022-11-29 DIAGNOSIS — N281 Cyst of kidney, acquired: Secondary | ICD-10-CM | POA: Diagnosis not present

## 2022-11-29 DIAGNOSIS — S2241XG Multiple fractures of ribs, right side, subsequent encounter for fracture with delayed healing: Secondary | ICD-10-CM | POA: Diagnosis not present

## 2022-11-29 DIAGNOSIS — N200 Calculus of kidney: Secondary | ICD-10-CM | POA: Diagnosis not present

## 2022-11-29 DIAGNOSIS — R778 Other specified abnormalities of plasma proteins: Secondary | ICD-10-CM | POA: Diagnosis not present

## 2022-11-29 DIAGNOSIS — I214 Non-ST elevation (NSTEMI) myocardial infarction: Secondary | ICD-10-CM | POA: Insufficient documentation

## 2022-11-29 DIAGNOSIS — E876 Hypokalemia: Secondary | ICD-10-CM | POA: Diagnosis not present

## 2022-11-29 DIAGNOSIS — Z515 Encounter for palliative care: Secondary | ICD-10-CM | POA: Diagnosis not present

## 2022-11-29 DIAGNOSIS — E7800 Pure hypercholesterolemia, unspecified: Secondary | ICD-10-CM

## 2022-11-29 DIAGNOSIS — Z8679 Personal history of other diseases of the circulatory system: Secondary | ICD-10-CM

## 2022-11-29 DIAGNOSIS — Z885 Allergy status to narcotic agent status: Secondary | ICD-10-CM

## 2022-11-29 DIAGNOSIS — E870 Hyperosmolality and hypernatremia: Secondary | ICD-10-CM | POA: Diagnosis not present

## 2022-11-29 DIAGNOSIS — S299XXA Unspecified injury of thorax, initial encounter: Secondary | ICD-10-CM | POA: Diagnosis not present

## 2022-11-29 DIAGNOSIS — K7460 Unspecified cirrhosis of liver: Secondary | ICD-10-CM | POA: Diagnosis present

## 2022-11-29 DIAGNOSIS — I6529 Occlusion and stenosis of unspecified carotid artery: Secondary | ICD-10-CM | POA: Diagnosis present

## 2022-11-29 DIAGNOSIS — I10 Essential (primary) hypertension: Secondary | ICD-10-CM | POA: Diagnosis present

## 2022-11-29 DIAGNOSIS — Z8673 Personal history of transient ischemic attack (TIA), and cerebral infarction without residual deficits: Secondary | ICD-10-CM

## 2022-11-29 DIAGNOSIS — R7989 Other specified abnormal findings of blood chemistry: Secondary | ICD-10-CM

## 2022-11-29 DIAGNOSIS — C3492 Malignant neoplasm of unspecified part of left bronchus or lung: Secondary | ICD-10-CM

## 2022-11-29 DIAGNOSIS — Z79899 Other long term (current) drug therapy: Secondary | ICD-10-CM

## 2022-11-29 DIAGNOSIS — Z7401 Bed confinement status: Secondary | ICD-10-CM | POA: Diagnosis not present

## 2022-11-29 DIAGNOSIS — F17210 Nicotine dependence, cigarettes, uncomplicated: Secondary | ICD-10-CM | POA: Diagnosis not present

## 2022-11-29 DIAGNOSIS — F0390 Unspecified dementia without behavioral disturbance: Secondary | ICD-10-CM | POA: Diagnosis not present

## 2022-11-29 DIAGNOSIS — Z87891 Personal history of nicotine dependence: Secondary | ICD-10-CM

## 2022-11-29 DIAGNOSIS — Z85118 Personal history of other malignant neoplasm of bronchus and lung: Secondary | ICD-10-CM

## 2022-11-29 DIAGNOSIS — Z79890 Hormone replacement therapy (postmenopausal): Secondary | ICD-10-CM

## 2022-11-29 LAB — TROPONIN I (HIGH SENSITIVITY)
Troponin I (High Sensitivity): 827 ng/L (ref <18)
Troponin I (High Sensitivity): 737 ng/L (ref <18)

## 2022-11-29 LAB — COMPREHENSIVE METABOLIC PANEL
ALT: 13 U/L (ref 0–44)
AST: 25 U/L (ref 15–41)
Albumin: 3.5 g/dL (ref 3.5–5.0)
Alkaline Phosphatase: 88 U/L (ref 38–126)
Anion gap: 11 (ref 5–15)
BUN: 43 mg/dL — ABNORMAL HIGH (ref 8–23)
CO2: 29 mmol/L (ref 22–32)
Calcium: 12.3 mg/dL — ABNORMAL HIGH (ref 8.9–10.3)
Chloride: 103 mmol/L (ref 98–111)
Creatinine, Ser: 4.52 mg/dL — ABNORMAL HIGH (ref 0.61–1.24)
GFR, Estimated: 12 mL/min — ABNORMAL LOW (ref >60)
Glucose, Bld: 120 mg/dL — ABNORMAL HIGH (ref 70–99)
Potassium: 2.9 mmol/L — ABNORMAL LOW (ref 3.5–5.1)
Sodium: 143 mmol/L (ref 135–145)
Total Bilirubin: 0.9 mg/dL (ref 0.3–1.2)
Total Protein: 6.6 g/dL (ref 6.5–8.1)

## 2022-11-29 LAB — VITAMIN D 25 HYDROXY (VIT D DEFICIENCY, FRACTURES): Vit D, 25-Hydroxy: 40.7 ng/mL (ref 30–100)

## 2022-11-29 MED ORDER — FUROSEMIDE 10 MG/ML IJ SOLN
60.0000 mg | Freq: Once | INTRAMUSCULAR | Status: AC
  Administered 2022-11-29: 60 mg via INTRAVENOUS
  Filled 2022-11-29: qty 6

## 2022-11-29 MED ORDER — POLYETHYLENE GLYCOL 3350 17 G PO PACK: 17.0000 g | PACK | Freq: Every day | ORAL | Status: DC | PRN

## 2022-11-29 MED ORDER — HEPARIN (PORCINE) 25000 UT/250ML-% IV SOLN
1100.0000 [IU]/h | INTRAVENOUS | Status: DC
  Administered 2022-11-29: 1100 [IU]/h via INTRAVENOUS
  Filled 2022-11-29: qty 250

## 2022-11-29 MED ORDER — MIRTAZAPINE 15 MG PO TABS
30.0000 mg | ORAL_TABLET | Freq: Every day | ORAL | Status: DC
  Administered 2022-11-29: 30 mg via ORAL
  Filled 2022-11-29: qty 2

## 2022-11-29 MED ORDER — APIXABAN 2.5 MG PO TABS: 2.5000 mg | ORAL_TABLET | Freq: Two times a day (BID) | ORAL | Status: DC

## 2022-11-29 MED ORDER — MEMANTINE HCL 5 MG PO TABS
2.5000 mg | ORAL_TABLET | Freq: Two times a day (BID) | ORAL | Status: DC
  Administered 2022-11-29: 2.5 mg via ORAL
  Filled 2022-11-29 (×2): qty 1

## 2022-11-29 MED ORDER — SODIUM CHLORIDE 0.9% FLUSH
3.0000 mL | Freq: Two times a day (BID) | INTRAVENOUS | Status: DC
  Administered 2022-11-29: 3 mL via INTRAVENOUS

## 2022-11-29 MED ORDER — SODIUM CHLORIDE 0.9 % IV SOLN: INTRAVENOUS | Status: DC

## 2022-11-29 MED ORDER — POTASSIUM CHLORIDE CRYS ER 20 MEQ PO TBCR
40.0000 meq | EXTENDED_RELEASE_TABLET | ORAL | Status: AC
  Administered 2022-11-29: 40 meq via ORAL
  Filled 2022-11-29: qty 2

## 2022-11-29 MED ORDER — POLYVINYL ALCOHOL 1.4 % OP SOLN: 1.0000 [drp] | OPHTHALMIC | Status: DC | PRN

## 2022-11-29 MED ORDER — METOPROLOL TARTRATE 25 MG PO TABS
25.0000 mg | ORAL_TABLET | Freq: Two times a day (BID) | ORAL | Status: DC
  Administered 2022-11-29: 25 mg via ORAL
  Filled 2022-11-29: qty 1

## 2022-11-29 MED ORDER — ACETAMINOPHEN 650 MG RE SUPP: 650.0000 mg | Freq: Four times a day (QID) | RECTAL | Status: DC | PRN

## 2022-11-29 MED ORDER — LEVOTHYROXINE SODIUM 25 MCG PO TABS
25.0000 ug | ORAL_TABLET | Freq: Every day | ORAL | Status: DC
  Filled 2022-11-29: qty 1

## 2022-11-29 MED ORDER — ALBUTEROL SULFATE (2.5 MG/3ML) 0.083% IN NEBU: 3.0000 mL | INHALATION_SOLUTION | Freq: Four times a day (QID) | RESPIRATORY_TRACT | Status: DC | PRN

## 2022-11-29 MED ORDER — ACETAMINOPHEN 325 MG PO TABS: 650.0000 mg | ORAL_TABLET | Freq: Four times a day (QID) | ORAL | Status: DC | PRN

## 2022-11-29 MED ORDER — UMECLIDINIUM-VILANTEROL 62.5-25 MCG/ACT IN AEPB
1.0000 | INHALATION_SPRAY | Freq: Every day | RESPIRATORY_TRACT | Status: DC
  Filled 2022-11-29: qty 14

## 2022-11-29 MED ORDER — CALCITONIN (SALMON) 200 UNIT/ML IJ SOLN
4.0000 [IU]/kg | Freq: Once | INTRAMUSCULAR | Status: AC
  Administered 2022-11-29: 304 [IU] via SUBCUTANEOUS
  Filled 2022-11-29: qty 1.52

## 2022-11-29 NOTE — Progress Notes (Signed)
New patient admitted from [facility]. Patient is alert and oriented to self. Patient is extremely hard of hearing with bilateral hearing aids in place. MP shows nsr rate in the 90's. Patientis on 02 at 2lnc. IV fluids infusing stopped. Patient had a elevated probnp. Patient denies complaints at this time.

## 2022-11-29 NOTE — Plan of Care (Signed)
Transfer from [facility] Mr. Orman Allocco is a 89 year old male with pmh hypertension, hyperlipidemia, atrial fibrillation, AVNRT, DM type II, AAA s/p repair cirrhosis, CKD stage III, anemia, prostate cancer  s/p radiation and Lupron injections, and ?dementia(report by ED provider)who presented after falling out of bed this morning.  Reported by wife not to be eating and drinking like normal the last couple days.  Vital signs were noted to be stable labs significant for proBNP 5599, high-sensitivity troponin 1130, potassium 3, BUN 52, creatinine 4.94(previous baseline around 2), and calcium 14.3.  Influenza, COVID-19, and RSV screening were negative.  Chest x-ray noted healed prior right-sided rib fractures.  CT scan of the abdomen pelvis had noted no signs of obstruction.  Case had been discussed with patient's urologist who noted no obstructive cause for symptoms.  Patient had not complained of any chest pain.  Transfer requested due to possible need of nephrology and/or cardiology consultative services.  Unclear if patient's confusion may be related to the hypercalcemia.  Excepted to a progressive bed.

## 2022-11-29 NOTE — Progress Notes (Signed)
ANTICOAGULATION CONSULT NOTE - Initial Consult  Pharmacy Consult for heparin  Indication: chest pain/ACS  Allergies  Allergen Reactions   Other Other (See Comments)   Morphine Other (See Comments)    hallucinate    Patient Measurements: Height: 5\' 10"  (177.8 cm) Weight: 80.6 kg (177 lb 11.1 oz) IBW/kg (Calculated) : 73 HEPARIN DW (KG): 80.6   Vital Signs: Temp: 97.9 F (36.6 C) (03/17 1900) Temp Source: Oral (03/17 1900) BP: 157/98 (03/17 1900) Pulse Rate: 87 (03/17 1900)  Labs: No results for input(s): "HGB", "HCT", "PLT", "APTT", "LABPROT", "INR", "HEPARINUNFRC", "HEPRLOWMOCWT", "CREATININE", "CKTOTAL", "CKMB", "TROPONINIHS" in the last 72 hours.  CrCl cannot be calculated (Patient's most recent lab result is older than the maximum 21 days allowed.).   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 Gery Pray   History of stroke    Hyperlipidemia    PAD (peripheral artery disease) ([place])    Stenting in lower extremities (no records)   Prostate cancer ([place])    XRT; in remission   Type 2 diabetes mellitus ([place])     Medications:  Medications Prior to Admission  Medication Sig Dispense Refill Last Dose   acetaminophen (TYLENOL) 500 MG tablet Take 1,000 mg by mouth every 6 (six) hours as needed for headache.      albuterol (VENTOLIN HFA) 108 (90 Base) MCG/ACT inhaler Inhale 1-2 puffs into the lungs every 6 (six) hours as needed for wheezing or shortness of breath. (Patient taking  differently: Inhale 2 puffs into the lungs every 6 (six) hours as needed for wheezing or shortness of breath.) 18 g 2    allopurinol (ZYLOPRIM) 100 MG tablet Take 1 tablet (100 mg total) by mouth daily. 90 tablet 3    ANORO ELLIPTA 62.5-25 MCG/ACT AEPB INHALE 1 PUFF BY MOUTH EVERY DAY 60 each 3    apixaban (ELIQUIS) 5 MG TABS tablet Take 2.5 mg by mouth 2 (two) times daily.      azelastine (ASTELIN) 0.1 % nasal spray Place 1 spray into both nostrils 2 (two) times daily. 30 mL 12    betamethasone valerate ointment (VALISONE) 0.1 % APPLY TOPICALLY 2TIMES DAILY. AVOID FACE, AXILLA AND GROIN. (USE AS NEEDED FOR PSORIASIS FLARES) 90 g 1    Calcium Carbonate-Vitamin D 600-400 MG-UNIT tablet Take 1 tablet by mouth daily.      Carboxymethylcellulose Sodium (ARTIFICIAL TEARS OP) Place 1 drop into both eyes daily as needed (dry eyes).      docusate sodium (COLACE) 250 MG capsule Take 250 mg by mouth 2 (two) times daily.      furosemide (LASIX) 20 MG tablet TAKE 1 TABLET BY MOUTH EVERY DAY 90 tablet 1    levothyroxine (SYNTHROID) 25 MCG tablet TAKE 1 TABLET BY MOUTH EVERY DAY 90 tablet 2    memantine (NAMENDA) 5 MG tablet Take 2.5 mg by mouth 2 (  two) times daily.      metoprolol tartrate (LOPRESSOR) 25 MG tablet TAKE 1 TABLET BY MOUTH TWICE A DAY 180 tablet 3    mirtazapine (REMERON) 30 MG tablet Take 1 tablet (30 mg total) by mouth at bedtime. 90 tablet 3    Multiple Vitamin (MULTIVITAMIN) tablet Take 1 tablet by mouth daily.      Scheduled:   calcitonin  4 Units/kg (Adjusted) Subcutaneous Once   furosemide  60 mg Intravenous Once   [START ON 11/30/2022] levothyroxine  25 mcg Oral Q0600   memantine  2.5 mg Oral BID   metoprolol tartrate  25 mg Oral BID   mirtazapine  30 mg Oral QHS   potassium chloride  40 mEq Oral NOW   sodium chloride flush  3 mL Intravenous Q12H   [START ON 11/30/2022] umeclidinium-vilanterol  1 puff Inhalation Daily    Assessment: 89 yo male transfer from [place] [facility] with  elevated troponin. Pharmacy consulted to dose heparin. He is noted with history of afib on apixaban at home (last dose not known)  Goal of Therapy:  Heparin level 0.3-0.7 units/ml aPTT 66-102 seconds Monitor platelets by anticoagulation protocol: Yes   Plan:  -No heparin bolus -start heparin at 1100 units/hr -heparin level and aPTT in 8 hrs  Hildred Laser, PharmD Clinical Pharmacist **Pharmacist phone directory can now be found on [email or web address] ([email or web address]).  Listed under [place].

## 2022-11-29 NOTE — H&P (Addendum)
History and Physical   Erik Mullins [id number] DOB: Dec 01, 1930 DOA: 11/29/2022  PCP: Janora Norlander, DO   Patient coming from: [facility] emergency department  Chief Complaint: Fall, laboratory abnormalities.  HPI: Erik Mullins is a 89 y.o. male with medical history significant of dementia, hyperlipidemia, gout, hypertension, COPD, CKD 3A, PAD, carotid artery disease, A-fib, hypothyroidism, lung cancer s/p radiation, prostate cancer on Lupron presenting after a fall at home.  His review of obtained with assistance of chart review and family due to patient's dementia.  He reportedly slipped out of his bed and was unable to bear weight afterwards.  He did not hit his head but is on blood thinners.  Family reports he has had worsening dementia over the past couple of years.  Has had increased confusion over the last couple of days as well.  His wife reports that he has been eating and drinking less for the past couple days.  Unable obtain full review of systems due to patient's baseline dementia.  ED Course: Vital signs in [facility] ED significant for blood pressure in the 160s otherwise stable, remained stable here with blood pressure in the 160s on 2 L of oxygen.  Lab workup significant for CMP showing potassium 3.0, bicarb 34, BUN 32, creatinine elevated to 4.94 from baseline of 2, calcium 14.3, albumin 3.4.  Magnesium normal.  CBC with normal limits.  High sensitive troponin elevated to 1130.  BNP elevated to 5599.  Respiratory panel for flu COVID RSV negative.  Urinalysis with trace protein and moderate blood only.  Chest x-ray showed no acute normality but did show old third fourth and fifth rib fractures.  Right elbow x-ray showed no acute normality.  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  Also noted was renal cysts with new cysts with MRI follow-up recommended. Patient  received 500 cc of IV fluids and was started on a rate of fluids in the ED.  Case was discussed with his urologist who agreed to see in consultation and also reviewed the CT and agreed that his AKI was not secondary to any obstructive disease.  Review of Systems: Unable to obtain full review of systems due to patient's baseline dementia, wife states he complains of pain and is difficult to know if he has any new complaints  Past Medical History:  Diagnosis Date   AAA (abdominal aortic aneurysm) ([place])    Remote ~1994    Anemia    Arthritis    Atrial fibrillation [facility])    Diagnosed December 2014   AVNRT (AV nodal re-entry tachycardia)    Possible   Cataract    Bilateral   Cirrhosis ([place])    CKD (chronic kidney disease) stage 3, GFR 30-59 ml/min ([place])    Essential hypertension    Gout    History of GI bleed    History of gout    History of hepatitis    History of radiation therapy 07/18/2020-08/06/2020   L Lung SBRT; Dr. Gery Pray   History of stroke    Hyperlipidemia    PAD (peripheral artery disease) ([place])    Stenting in lower extremities (no records)   Prostate cancer ([place])    XRT; in remission   Type 2 diabetes mellitus ([place])     Past Surgical History:  Procedure Laterality Date   ABDOMINAL AORTIC ANEURYSM REPAIR  1980's   in [place]  APPENDECTOMY     CAROTID ENDARTERECTOMY Right ~ 1999   CATARACT EXTRACTION W/ INTRAOCULAR LENS  IMPLANT, BILATERAL Bilateral 2014   CHOLECYSTECTOMY     COLON SURGERY     PARTIAL COLECTOMY     "I was bleeding to death inside; took out 2/3 of my colon" (08/22/2013)    Social History  reports that he quit smoking about 2 years ago. His smoking use included cigarettes. He started smoking about 78 years ago. He has a 70.00 pack-year smoking history. He has quit using smokeless tobacco.  His smokeless tobacco use included snuff and chew. He reports that he does not currently use alcohol. He reports that he does not use  drugs.  Allergies  Allergen Reactions   Other Other (See Comments)   Morphine Other (See Comments)    hallucinate    Family History  Problem Relation Age of Onset   Cancer Mother        uterine deceased age 26   Hypertension Sister    Arrhythmia Other        Atrial fibrillation   Hypertension Maternal Grandmother   Reviewed on admission  Prior to Admission medications   Medication Sig Start Date End Date Taking? Authorizing Provider  acetaminophen (TYLENOL) 500 MG tablet Take 1,000 mg by mouth every 6 (six) hours as needed for headache.    [provider]  albuterol (VENTOLIN HFA) 108 (90 Base) MCG/ACT inhaler Inhale 1-2 puffs into the lungs every 6 (six) hours as needed for wheezing or shortness of breath. Patient taking differently: Inhale 2 puffs into the lungs every 6 (six) hours as needed for wheezing or shortness of breath. 03/03/19   Janora Norlander, DO  allopurinol (ZYLOPRIM) 100 MG tablet Take 1 tablet (100 mg total) by mouth daily. 04/06/22   Ronnie Doss M, DO  ANORO ELLIPTA 62.5-25 MCG/ACT AEPB INHALE 1 PUFF BY MOUTH EVERY DAY 06/08/22   Ronnie Doss M, DO  apixaban (ELIQUIS) 5 MG TABS tablet Take 2.5 mg by mouth 2 (two) times daily.    [provider]  azelastine (ASTELIN) 0.1 % nasal spray Place 1 spray into both nostrils 2 (two) times daily. 08/10/22   Janora Norlander, DO  betamethasone valerate ointment (VALISONE) 0.1 % APPLY TOPICALLY 2TIMES DAILY. AVOID FACE, AXILLA AND GROIN. (USE AS NEEDED FOR PSORIASIS FLARES) 07/13/22   Ronnie Doss M, DO  Calcium Carbonate-Vitamin D 600-400 MG-UNIT tablet Take 1 tablet by mouth daily.    [provider]  Carboxymethylcellulose Sodium (ARTIFICIAL TEARS OP) Place 1 drop into both eyes daily as needed (dry eyes).    [provider]  docusate sodium (COLACE) 250 MG capsule Take 250 mg by mouth 2 (two) times daily.    [provider]  furosemide (LASIX) 20 MG tablet  TAKE 1 TABLET BY MOUTH EVERY DAY 08/21/22   Satira Sark, MD  levothyroxine (SYNTHROID) 25 MCG tablet TAKE 1 TABLET BY MOUTH EVERY DAY 09/17/22   Ronnie Doss M, DO  memantine (NAMENDA) 5 MG tablet Take 2.5 mg by mouth 2 (two) times daily.    [provider]  metoprolol tartrate (LOPRESSOR) 25 MG tablet TAKE 1 TABLET BY MOUTH TWICE A DAY 09/17/22   Satira Sark, MD  mirtazapine (REMERON) 30 MG tablet Take 1 tablet (30 mg total) by mouth at bedtime. 09/21/22   Janora Norlander, DO  Multiple Vitamin (MULTIVITAMIN) tablet Take 1 tablet by mouth daily.    [provider]  Physical Exam: Vitals:   11/29/22 1710 11/29/22 1711 11/29/22 1739 11/29/22 1900  BP:   (!) 160/79 (!) 157/98  Pulse: 91 86 90 87  Resp: 14 14 14 15   Temp:    97.9 F (36.6 C)  TempSrc:   Oral Oral  SpO2: 97% 97% 94% 93%  Weight:   80.6 kg   Height:   5\' 10"  (1.778 m)     Physical Exam Constitutional:      General: He is not in acute distress.    Appearance: Normal appearance.  HENT:     Head: Normocephalic and atraumatic.     Mouth/Throat:     Mouth: Mucous membranes are moist.     Pharynx: Oropharynx is clear.  Eyes:     Extraocular Movements: Extraocular movements intact.     Pupils: Pupils are equal, round, and reactive to light.  Cardiovascular:     Rate and Rhythm: Normal rate and regular rhythm.     Pulses: Normal pulses.     Heart sounds: Normal heart sounds.  Pulmonary:     Effort: Pulmonary effort is normal. No respiratory distress.     Breath sounds: Decreased breath sounds present. No rales.  Abdominal:     General: Bowel sounds are normal. There is no distension.     Palpations: Abdomen is soft.     Tenderness: There is no abdominal tenderness.  Musculoskeletal:        General: No swelling or deformity.     Right lower leg: No edema.     Left lower leg: No edema.  Skin:    General: Skin is warm and dry.  Neurological:     General: No focal deficit present.      Mental Status: Mental status is at baseline.    Labs on Admission: I have personally reviewed following labs and imaging studies  CBC: No results for input(s): "WBC", "NEUTROABS", "HGB", "HCT", "MCV", "PLT" in the last 168 hours.  Basic Metabolic Panel: No results for input(s): "NA", "K", "CL", "CO2", "GLUCOSE", "BUN", "CREATININE", "CALCIUM", "MG", "PHOS" in the last 168 hours.  GFR: CrCl cannot be calculated (Patient's most recent lab result is older than the maximum 21 days allowed.).  Liver Function Tests: No results for input(s): "AST", "ALT", "ALKPHOS", "BILITOT", "PROT", "ALBUMIN" in the last 168 hours.  Urine analysis:    Component Value Date/Time   COLORURINE YELLOW 08/22/2013 1237   APPEARANCEUR Clear 04/09/2022 1537   LABSPEC 1.023 08/22/2013 1237   PHURINE 5.0 08/22/2013 1237   GLUCOSEU Negative 04/09/2022 1537   HGBUR NEGATIVE 08/22/2013 1237   BILIRUBINUR Negative 04/09/2022 1537   KETONESUR 15 (A) 08/22/2013 1237   PROTEINUR Negative 04/09/2022 1537   PROTEINUR 100 (A) 08/22/2013 1237   UROBILINOGEN 0.2 04/05/2020 1358   UROBILINOGEN 0.2 08/22/2013 1237   NITRITE Negative 04/09/2022 1537   NITRITE NEGATIVE 08/22/2013 1237   LEUKOCYTESUR Negative 04/09/2022 1537    Radiological Exams on Admission: No results found.  EKG: Does not appear to have been ordered at outside facility.  Will order.  Assessment/Plan Principal Problem:   Acute renal failure superimposed on stage 3a chronic kidney disease, unspecified acute renal failure type (HCC) Active Problems:   Hyperlipidemia   Essential hypertension, benign   Carotid artery stenosis   Atrial fibrillation (HCC)   Gout   Prostate cancer ([place])   Vascular dementia without behavioral disturbance (HCC)   COPD clinically Group B   Squamous cell lung cancer, left (HCC)   PAD (peripheral  artery disease) ([place])   Hypothyroidism   Hypokalemia   Hypercalcemia   NSTEMI (non-ST elevated myocardial  infarction) (HCC)   Elevated brain natriuretic peptide (BNP) level   AKI on CKD 3 Hypokalemia Hypercalcemia > Patient noted incidentally to have potassium 3.0 and calcium of 14.3.  Creatinine elevated to 4.94 from baseline of 2. > Patient reportedly eating and drinking less for the last couple days.  His imaging was reviewed by his urologist and there is no evidence of obstructive changes. > Complicating the picture his troponin and BNP are significantly elevated which would be concerning for CHF which is contradictory to his history of decreased p.o. intake.for the past couple days.  And has continued to take his diuretic.  He is also taking a calcium supplement. > Does have prostate cancer history on Lupron but no evidence of metastases on CT abdomen pelvis noted. > Discussed with nephrology by phone and they feel that IV fluids remains appropriate in his case and can give with Lasix if concern for volume overload.  Is also reasonable to give calcitonin but will hold off on IV bisphosphonate for now. (Did not request formal consult at this time) > On physical exam, no edema nor crackles. > Did receive at least 500 cc at outside hospital.  Did not receive any potassium. - Continue monitor in progressive unit - 40 mill equivalents p.o. potassium, now (prior to lasix) - Recheck CMP now and trend renal function and electrolytes - Vit D, PTH - IV fluids at 200 cc an hour overnight, will schedule a dose of Lasix 60 mg IV to start about an hour after he starts receiving fluids to help with calcium removal. - Calcitonin x 1, reevaluate based on trend of calcium overnight.  NSTEMI Elevated BNP > Patient noted incidentally to have high-sensitivity troponin of 1130 and BNP of 5599. > No history of CHF noted in chart.  Last echo in 2022 showed EF 60 on 6 5%, indeterminate diastolic function and mildly enlarged RV.   > No history of CAD but does have peripheral arterial disease and carotid artery  disease.  Is currently on Eliquis.   > No reports of chest pain or shortness of breath. > On exam no evidence of volume overload. > Discussed with patient's wife who is at bedside.  She agrees that even his renal function was normal it may not be the most appropriate thing for him to undergo invasive procedures such as a catheterization.  And I explained that with an elevated creatinine he was able worse candidate for this procedure.  No plan for catheterization even if he is truly having an MI. > Paged 2 numbers listed for Cardiology fellow, awaiting call back, will hold of on repeat pages unless significant change in lab results/status. - Consider cardiology consult as needed - Lasix along with IV fluids as above, still believe he is likely volume down unclear etiology for strain on heart. - Magnesium level normal at outside facility - Trend renal function and electrolytes - Trend troponin - Strict I's and O's, daily weights - Echocardiogram - Switching anticoagulation to heparin  Dementia > Worsening over the past year or 2.  Has had some worsening confusion in the last few days. > Some goals of care discussions held.  Confirmed patient is DNR/DNI.  Would not want significant invasive interventions such as cardiac catheterization as above. - Continue home Namenda, mirtazapine  Hypertension - Continue home metoprolol - Lasix as above  COPD - Continue home  Anoro Ellipta and as needed albuterol  Hyperlipidemia PAD Carotid artery disease - Is on Eliquis  Atrial fibrillation - Continue home metoprolol and Eliquis  History of lung cancer > Status post radiation - Noted  Prostate cancer history > On Lupron injections - Noted  DVT prophylaxis: Heparin Code Status:   DNR/DNI Family Communication:  Discussed at bedside with wife Disposition Plan:   Patient is from:  [facility]  Anticipated DC to:  Pending clinical course  Anticipated DC date:  3 to 7  days  Anticipated DC barriers: None at this time  Consults called:  Discussed over the phone with nephrology. Admission status:  Inpatient, progressive  Severity of Illness: The appropriate patient status for this patient is INPATIENT. Inpatient status is judged to be reasonable and necessary in order to provide the required intensity of service to ensure the patient's safety. The patient's presenting symptoms, physical exam findings, and initial radiographic and laboratory data in the context of their chronic comorbidities is felt to place them at high risk for further clinical deterioration. Furthermore, it is not anticipated that the patient will be medically stable for discharge from the hospital within 2 midnights of admission.   * I certify that at the point of admission it is my clinical judgment that the patient will require inpatient hospital care spanning beyond 2 midnights from the point of admission due to high intensity of service, high risk for further deterioration and high frequency of surveillance required.Marcelyn Bruins MD [facility]  How to contact the [facility] Attending or Consulting provider [place] or covering provider during after hours [place], for this patient?   Check the care team in [facility] and look for a) attending/consulting [facility] provider listed and b) the [facility] team listed Log into [email or web address] and use [place]'s universal password to access. If you do not have the password, please contact the hospital operator. Locate the [facility] provider you are looking for under [facility] and page to a number that you can be directly reached. If you still have difficulty reaching the provider, please page the [facility] (Director on Call) for the Hospitalists listed on amion for assistance.  11/29/2022, 7:45 PM

## 2022-11-30 ENCOUNTER — Inpatient Hospital Stay (HOSPITAL_COMMUNITY): Payer: Medicare Other

## 2022-11-30 DIAGNOSIS — I214 Non-ST elevation (NSTEMI) myocardial infarction: Secondary | ICD-10-CM | POA: Diagnosis not present

## 2022-11-30 DIAGNOSIS — N179 Acute kidney failure, unspecified: Secondary | ICD-10-CM | POA: Diagnosis not present

## 2022-11-30 DIAGNOSIS — E876 Hypokalemia: Secondary | ICD-10-CM | POA: Diagnosis not present

## 2022-11-30 LAB — COMPREHENSIVE METABOLIC PANEL
ALT: 12 U/L (ref 0–44)
AST: 19 U/L (ref 15–41)
Albumin: 3.1 g/dL — ABNORMAL LOW (ref 3.5–5.0)
Alkaline Phosphatase: 77 U/L (ref 38–126)
Anion gap: 11 (ref 5–15)
BUN: 41 mg/dL — ABNORMAL HIGH (ref 8–23)
CO2: 28 mmol/L (ref 22–32)
Calcium: 11.5 mg/dL — ABNORMAL HIGH (ref 8.9–10.3)
Chloride: 107 mmol/L (ref 98–111)
Creatinine, Ser: 4.41 mg/dL — ABNORMAL HIGH (ref 0.61–1.24)
GFR, Estimated: 12 mL/min — ABNORMAL LOW (ref >60)
Glucose, Bld: 121 mg/dL — ABNORMAL HIGH (ref 70–99)
Potassium: 3.6 mmol/L (ref 3.5–5.1)
Sodium: 146 mmol/L — ABNORMAL HIGH (ref 135–145)
Total Bilirubin: 0.7 mg/dL (ref 0.3–1.2)
Total Protein: 6 g/dL — ABNORMAL LOW (ref 6.5–8.1)

## 2022-11-30 LAB — MAGNESIUM: Magnesium: 1.7 mg/dL (ref 1.7–2.4)

## 2022-11-30 MED ORDER — POTASSIUM CHLORIDE 20 MEQ PO PACK
40.0000 meq | PACK | Freq: Once | ORAL | Status: AC
  Administered 2022-11-30: 40 meq via ORAL
  Filled 2022-11-30: qty 2

## 2022-11-30 MED ORDER — OXYBUTYNIN CHLORIDE 5 MG PO TABS: 2.5000 mg | ORAL_TABLET | Freq: Four times a day (QID) | ORAL | Status: DC | PRN

## 2022-11-30 MED ORDER — ACETAMINOPHEN 325 MG PO TABS: 650.0000 mg | ORAL_TABLET | Freq: Four times a day (QID) | ORAL | Status: DC | PRN

## 2022-11-30 MED ORDER — SODIUM CHLORIDE 0.9% FLUSH: 3.0000 mL | INTRAVENOUS | Status: DC | PRN

## 2022-11-30 MED ORDER — SODIUM CHLORIDE 0.9% FLUSH
3.0000 mL | Freq: Two times a day (BID) | INTRAVENOUS | Status: DC
  Administered 2022-11-30: 3 mL via INTRAVENOUS

## 2022-11-30 MED ORDER — LORAZEPAM 2 MG/ML PO CONC: 1.0000 mg | ORAL | Status: DC | PRN

## 2022-11-30 MED ORDER — HYDROMORPHONE HCL 1 MG/ML IJ SOLN
0.5000 mg | INTRAMUSCULAR | Status: DC | PRN
  Administered 2022-11-30 (×2): 1 mg via INTRAVENOUS
  Filled 2022-11-30 (×2): qty 1

## 2022-11-30 MED ORDER — ONDANSETRON 4 MG PO TBDP: 4.0000 mg | ORAL_TABLET | Freq: Four times a day (QID) | ORAL | Status: DC | PRN

## 2022-11-30 MED ORDER — POTASSIUM CHLORIDE 20 MEQ PO PACK
40.0000 meq | PACK | Freq: Once | ORAL | Status: DC
  Filled 2022-11-30 (×2): qty 2

## 2022-11-30 MED ORDER — LORAZEPAM 2 MG/ML IJ SOLN
0.5000 mg | INTRAMUSCULAR | Status: DC | PRN
  Administered 2022-11-30: 0.5 mg via INTRAVENOUS
  Filled 2022-11-30: qty 1

## 2022-11-30 MED ORDER — SODIUM CHLORIDE 0.9 % IV SOLN: INTRAVENOUS | Status: DC

## 2022-11-30 MED ORDER — QUETIAPINE FUMARATE 25 MG PO TABS: 25.0000 mg | ORAL_TABLET | Freq: Two times a day (BID) | ORAL | Status: DC

## 2022-11-30 MED ORDER — HALOPERIDOL LACTATE 5 MG/ML IJ SOLN: 0.5000 mg | INTRAMUSCULAR | Status: DC | PRN

## 2022-11-30 MED ORDER — LOPERAMIDE HCL 2 MG PO CAPS: 2.0000 mg | ORAL_CAPSULE | ORAL | Status: DC | PRN

## 2022-11-30 MED ORDER — LORAZEPAM 2 MG/ML IJ SOLN: 1.0000 mg | INTRAMUSCULAR | Status: DC | PRN

## 2022-11-30 MED ORDER — HYDROMORPHONE HCL 1 MG/ML IJ SOLN: 0.5000 mg | INTRAMUSCULAR | 0 refills | Status: DC | PRN

## 2022-11-30 MED ORDER — CALCITONIN (SALMON) 200 UNIT/ML IJ SOLN
4.0000 [IU]/kg | Freq: Two times a day (BID) | INTRAMUSCULAR | Status: DC
  Filled 2022-11-30: qty 1.48

## 2022-11-30 MED ORDER — LORAZEPAM 2 MG/ML IJ SOLN: 1.0000 mg | INTRAMUSCULAR | 0 refills | Status: DC | PRN

## 2022-11-30 MED ORDER — LORAZEPAM 1 MG PO TABS: 1.0000 mg | ORAL_TABLET | ORAL | Status: DC | PRN

## 2022-11-30 MED ORDER — ONDANSETRON HCL 4 MG/2ML IJ SOLN: 4.0000 mg | Freq: Four times a day (QID) | INTRAMUSCULAR | Status: DC | PRN

## 2022-11-30 MED ORDER — TRAZODONE HCL 50 MG PO TABS
25.0000 mg | ORAL_TABLET | Freq: Every evening | ORAL | Status: DC | PRN
  Administered 2022-11-30: 25 mg via ORAL
  Filled 2022-11-30: qty 1

## 2022-11-30 MED ORDER — QUETIAPINE FUMARATE 25 MG PO TABS
25.0000 mg | ORAL_TABLET | Freq: Two times a day (BID) | ORAL | Status: DC
  Administered 2022-11-30: 25 mg via ORAL
  Filled 2022-11-30: qty 1

## 2022-11-30 MED ORDER — MAGIC MOUTHWASH: 15.0000 mL | Freq: Four times a day (QID) | ORAL | Status: DC | PRN

## 2022-11-30 MED ORDER — POTASSIUM CHLORIDE 10 MEQ/100ML IV SOLN
10.0000 meq | INTRAVENOUS | Status: AC
  Administered 2022-11-30 (×4): 10 meq via INTRAVENOUS
  Filled 2022-11-30 (×4): qty 100

## 2022-11-30 MED ORDER — DIPHENHYDRAMINE HCL 50 MG/ML IJ SOLN: 12.5000 mg | INTRAMUSCULAR | Status: DC | PRN

## 2022-11-30 MED ORDER — HALOPERIDOL LACTATE 2 MG/ML PO CONC: 0.5000 mg | ORAL | Status: DC | PRN

## 2022-11-30 MED ORDER — ACETAMINOPHEN 650 MG RE SUPP: 650.0000 mg | Freq: Four times a day (QID) | RECTAL | Status: DC | PRN

## 2022-11-30 MED ORDER — ATROPINE SULFATE 1 % OP SOLN: 4.0000 [drp] | OPHTHALMIC | Status: DC | PRN

## 2022-11-30 MED ORDER — SODIUM CHLORIDE 0.9 % IV SOLN: 250.0000 mL | INTRAVENOUS | Status: DC | PRN

## 2022-11-30 MED ORDER — HALOPERIDOL 1 MG PO TABS: 0.5000 mg | ORAL_TABLET | ORAL | Status: DC | PRN

## 2022-11-30 NOTE — Progress Notes (Signed)
Pt has critical lab result. Troponin is 827 as relayed by lab. Argie Ramming, MD made aware; no new further orders made

## 2022-11-30 NOTE — Progress Notes (Signed)
Patient discharged to [facility] via ems transport. Daughter present with patient.

## 2022-11-30 NOTE — Progress Notes (Signed)
PROGRESS NOTE        PATIENT DETAILS Name: Erik Mullins Age: 89 y.o. Sex: male Date of Birth: 01-Oct-1930 Admit Date: 11/29/2022 Admitting Physician Marcelyn Bruins, MD [id number], Koleen Distance, DO  Brief Summary: Patient is a 89 y.o.  male with history of advanced dementia, A-fib on Eliquis, prostate cancer on Lupron, history of lung cancer s/p radiation, CKD stage IIIa, PAD, COPD-who presented to the [facility] ED following a mechanical fall-patient was found to have AKI, hypercalcemia, non-STEMI.  Patient was transferred to [facility] service for further evaluation and treatment.  Significant events: 3/17>> transfer from [facility] to [facility]-AKI/hypercalcemia/non-STEMI 3/18>> discussion with spouse at bedside-comfort care.  Social work consulted for residential hospice placement.  Significant studies: 3/17>> x-ray right elbow: No fracture 3/17>> CXR: No PNA, remote healed right posterior third, fourth, fifth rib fractures.  No acute rib fractures. 3/17>> CT abdomen/pelvis: No acute findings.  Polycystic change involving left kidney.  Significant microbiology data: None  Procedures: None  Consults: Palliative care  Subjective: Confused-wanting to pull out IV lines/telemetry leads.  Spouse at bedside acknowledges that male external catheter really startles him and causes him to get agitated.  Objective: Vitals: Blood pressure (!) 104/53, pulse 99, temperature 97.7 F (36.5 C), temperature source Axillary, resp. rate 17, height 5\' 10"  (1.778 m), weight 75.3 kg, SpO2 92 %.   Exam: Gen Exam: Restless-confused-but not in distress. HEENT:atraumatic, normocephalic Chest: B/L clear to auscultation anteriorly CVS:S1S2 regular Abdomen:soft non tender, non distended Extremities:no edema Neurology: Difficult exam but seems nonfocal. Skin: no rash  Pertinent Labs/Radiology:    Latest Ref Rng & Units 08/10/2022    9:48 AM 03/09/2022   12:00 AM  12/05/2021    9:58 AM  CBC  WBC 3.4 - 10.8 x10E3/uL 7.1  6.9     6.5   Hemoglobin 13.0 - 17.7 g/dL 14.0  14.3     16.3   Hematocrit 37.5 - 51.0 % 40.9  43     47.9   Platelets 150 - 450 x10E3/uL 167  168     166      This result is from an external source.    Lab Results  Component Value Date   NA 146 (H) 11/30/2022   K 3.6 11/30/2022   CL 107 11/30/2022   CO2 28 11/30/2022      Assessment/Plan: AKI on CKD 3 Likely hemodynamically mediated in the setting of decreased oral intake over the past several days/weeks.   Some minimal improvement in renal function overnight after IVF hydration Family not interested in aggressive care including HD Long discussion at bedside-has been transitioned to full comfort measures-see below discussion  Hypercalcemia Some response after IVF/Lasix/calcitonin No further treatment planned-no monitoring plan-comfort measures now ineffective.  Hypokalemia Repleted  Hypernatremia Mild Supportive care-comfort measures now in effect-no plans to recheck labs  Acute metabolic encephalopathy superimposed on dementia Encephalopathy likely due to AKI/severe hypercalcemia Supportive care-transition to full comfort measures-starting scheduled Seroquel Add as needed IV Haldol/Ativan for comfort  Non-STEMI Given advanced dementia-unclear whether he had chest pain/syncope when he had a fall on day of admit Was on IV heparin-but this has been discontinued-as full comfort measures now in effect  PAF Sinus rhythm No plan to resume rate control agents anticoagulation/Eliquis-as full comfort measures and affect  PAD HLD Supportive care  History of lung cancer  s/p radiation History of prostate cancer on Lupron injections  Dementia Advanced dementia at baseline Failure to thrive syndrome Palliative care Per spouse-needs help with almost all activities of daily living [facility] with the help of a walker-but for the past several days/weeks-has gotten  significantly weaker Very poor oral intake for the past several days Long discussion with spouse at bedside-understands poor overall prognosis-patient did not want aggressive care-understands patient would not be a candidate for HD/LHC. He is currently very restless with numerous IV lines, telemetry leads, pulse oximetry, nasal cannula attached to him-and has attempted to remove all of these numerous times.  Patient gets very restless/agitated after using the external urine catheter. After extensive discussion-family is agreeable to transition to full comfort measures at this point.  Have asked social worker to see if patient could qualify for inpatient hospice at [facility]. Start scheduled Seroquel, as needed Haldol/Ativan/Dilaudid ordered for comfort Will get palliative care consultation for symptom management/disposition-and ongoing discussions with family.   BMI: Estimated body mass index is 23.82 kg/m as calculated from the following:   Height as of this encounter: 5\' 10"  (1.778 m).   Weight as of this encounter: 75.3 kg.   Code status:   Code Status: DNR   DVT Prophylaxis: Not needed-comfort care    Family Communication: Spouse at bedside   Disposition Plan: Status is: Inpatient Remains inpatient appropriate because: Severity of illness   Planned Discharge Destination:Residential hospice vs Home Hospice   Diet: Diet Order             Diet regular Room service appropriate? Yes; Fluid consistency: Thin  Diet effective now                     Antimicrobial agents: Anti-infectives (From admission, onward)    None        MEDICATIONS: Scheduled Meds:  QUEtiapine  25 mg Oral BID   sodium chloride flush  3 mL Intravenous Q12H   Continuous Infusions:  sodium chloride Stopped (11/30/22 0803)   PRN Meds:.sodium chloride, acetaminophen **OR** acetaminophen, atropine, diphenhydrAMINE, haloperidol **OR** haloperidol **OR** haloperidol lactate,  HYDROmorphone (DILAUDID) injection, loperamide, LORazepam **OR** LORazepam **OR** LORazepam, magic mouthwash, ondansetron **OR** ondansetron (ZOFRAN) IV, oxybutynin, sodium chloride flush   I have personally reviewed following labs and imaging studies  LABORATORY DATA: CBC: No results for input(s): "WBC", "NEUTROABS", "HGB", "HCT", "MCV", "PLT" in the last 168 hours.  Basic Metabolic Panel: Recent Labs  Lab 11/29/22 2253 11/30/22 0354  NA 143 146*  K 2.9* 3.6  CL 103 107  CO2 29 28  GLUCOSE 120* 121*  BUN 43* 41*  CREATININE 4.52* 4.41*  CALCIUM 12.3* 11.5*  MG  --  1.7    GFR: Estimated Creatinine Clearance: 11.7 mL/min (A) (by C-G formula based on SCr of 4.41 mg/dL (H)).  Liver Function Tests: Recent Labs  Lab 11/29/22 2253 11/30/22 0354  AST 25 19  ALT 13 12  ALKPHOS 88 77  BILITOT 0.9 0.7  PROT 6.6 6.0*  ALBUMIN 3.5 3.1*   No results for input(s): "LIPASE", "AMYLASE" in the last 168 hours. No results for input(s): "AMMONIA" in the last 168 hours.  Coagulation Profile: No results for input(s): "INR", "PROTIME" in the last 168 hours.  Cardiac Enzymes: No results for input(s): "CKTOTAL", "CKMB", "CKMBINDEX", "TROPONINI" in the last 168 hours.  BNP (last 3 results) No results for input(s): "PROBNP" in the last 8760 hours.  Lipid Profile: No results for input(s): "CHOL", "HDL", "LDLCALC", "TRIG", "CHOLHDL", "  LDLDIRECT" in the last 72 hours.  Thyroid Function Tests: No results for input(s): "TSH", "T4TOTAL", "FREET4", "T3FREE", "THYROIDAB" in the last 72 hours.  Anemia Panel: No results for input(s): "VITAMINB12", "FOLATE", "FERRITIN", "TIBC", "IRON", "RETICCTPCT" in the last 72 hours.  Urine analysis:    Component Value Date/Time   COLORURINE YELLOW 08/22/2013 1237   APPEARANCEUR Clear 04/09/2022 1537   LABSPEC 1.023 08/22/2013 1237   PHURINE 5.0 08/22/2013 1237   GLUCOSEU Negative 04/09/2022 1537   HGBUR NEGATIVE 08/22/2013 1237   BILIRUBINUR  Negative 04/09/2022 1537   KETONESUR 15 (A) 08/22/2013 1237   PROTEINUR Negative 04/09/2022 1537   PROTEINUR 100 (A) 08/22/2013 1237   UROBILINOGEN 0.2 04/05/2020 1358   UROBILINOGEN 0.2 08/22/2013 1237   NITRITE Negative 04/09/2022 1537   NITRITE NEGATIVE 08/22/2013 1237   LEUKOCYTESUR Negative 04/09/2022 1537    Sepsis Labs: Lactic Acid, Venous    Component Value Date/Time   LATICACIDVEN 2.09 08/22/2013 1113    MICROBIOLOGY: No results found for this or any previous visit (from the past 240 hour(s)).  RADIOLOGY STUDIES/RESULTS: No results found.   LOS: 1 day   Oren Binet, MD  [facility]    To contact the attending provider between 7A-7P or the covering provider during after hours 7P-7A, please log into the web site [email or web address] and access using universal [place] password for that web site. If you do not have the password, please call the hospital operator.  11/30/2022, 9:11 AM

## 2022-11-30 NOTE — Discharge Summary (Signed)
PATIENT DETAILS Name: Erik Mullins Age: 89 y.o. Sex: male Date of Birth: 11/18/1930 MRN: [id number]. Admitting Physician: Marcelyn Bruins, MD [id number], Koleen Distance, DO  Admit Date: 11/29/2022 Discharge date: 11/30/2022  Recommendations for Outpatient Follow-up:  Optimize comfort care  Admitted From:  Home  Disposition: Hospice care   Discharge Condition: poor  CODE STATUS:   Code Status: DNR   Diet recommendation:  Diet Order             Diet regular Room service appropriate? Yes; Fluid consistency: Thin  Diet effective now           Diet - low sodium heart healthy                    Brief Summary: Patient is a 89 y.o.  male with history of advanced dementia, A-fib on Eliquis, prostate cancer on Lupron, history of lung cancer s/p radiation, CKD stage IIIa, PAD, COPD-who presented to the [facility] ED following a mechanical fall-patient was found to have AKI, hypercalcemia, non-STEMI.  Patient was transferred to [facility] service for further evaluation and treatment.   Significant events: 3/17>> transfer from [facility] to [facility]-AKI/hypercalcemia/non-STEMI 3/18>> discussion with spouse at bedside-comfort care.  Social work consulted for residential hospice placement.   Significant studies: 3/17>> x-ray right elbow: No fracture 3/17>> CXR: No PNA, remote healed right posterior third, fourth, fifth rib fractures.  No acute rib fractures. 3/17>> CT abdomen/pelvis: No acute findings.  Polycystic change involving left kidney.   Significant microbiology data: None   Procedures: None   Consults: Palliative care  Brief Hospital Course: AKI on CKD 3 Likely hemodynamically mediated in the setting of decreased oral intake over the past several days/weeks.   Some minimal improvement in renal function overnight after IVF hydration Family not interested in aggressive care including HD Long discussion at bedside-has been transitioned to full comfort  measures-see below discussion   Hypercalcemia Some response after IVF/Lasix/calcitonin No further treatment planned-no monitoring plan-comfort measures now ineffective.   Hypokalemia Repleted   Hypernatremia Mild Supportive care-comfort measures now in effect-no plans to recheck labs   Acute metabolic encephalopathy superimposed on dementia Encephalopathy likely due to AKI/severe hypercalcemia Supportive care-transition to full comfort measures-starting scheduled Seroquel Add as needed IV Haldol/Ativan for comfort   Non-STEMI Given advanced dementia-unclear whether he had chest pain/syncope when he had a fall on day of admit Was on IV heparin-but this has been discontinued-as full comfort measures now in effect   PAF Sinus rhythm No plan to resume rate control agents anticoagulation/Eliquis-as full comfort measures and affect   PAD HLD Supportive care   History of lung cancer s/p radiation History of prostate cancer on Lupron injections   Dementia Advanced dementia at baseline Failure to thrive syndrome Palliative care Per spouse-needs help with almost all activities of daily living [facility] with the help of a walker-but for the past several days/weeks-has gotten significantly weaker Very poor oral intake for the past several days Long discussion with spouse at bedside-understands poor overall prognosis-patient did not want aggressive care-understands patient would not be a candidate for HD/LHC. He was very restless with numerous IV lines, telemetry leads, pulse oximetry, nasal cannula attached to him-and had attempted to remove all of these numerous times-after transitioning to comfort measures-getting narcotics/Seroquel-he has now gotten much more comfortable.  Suspect he is very close to EOL at this point-continue comfort medications with Haldol/Ativan/Dilaudid-and will benefit from transitioning to residential hospice on discharge.  BMI: Estimated body mass index is  23.82 kg/m as calculated from the following:   Height as of this encounter: 5\' 10"  (1.778 m).   Weight as of this encounter: 75.3 kg.    Discharge Diagnoses:  Principal Problem:   Acute renal failure superimposed on stage 3a chronic kidney disease, unspecified acute renal failure type (HCC) Active Problems:   Hyperlipidemia   Essential hypertension, benign   Carotid artery stenosis   Atrial fibrillation (HCC)   Gout   Prostate cancer ([place])   Vascular dementia without behavioral disturbance (HCC)   COPD clinically Group B   Squamous cell lung cancer, left (HCC)   PAD (peripheral artery disease) (HCC)   Hypothyroidism   Hypokalemia   Hypercalcemia   NSTEMI (non-ST elevated myocardial infarction) (HCC)   Elevated brain natriuretic peptide (BNP) level   Discharge Instructions:  Activity:  As tolerated    Discharge Instructions     Diet - low sodium heart healthy   Complete by: As directed    Increase activity slowly   Complete by: As directed    No dressing needed   Complete by: As directed       Allergies as of 11/30/2022       Reactions   Other Other (See Comments)   Morphine Other (See Comments)   hallucinate        Medication List     STOP taking these medications    acetaminophen 500 MG tablet Commonly known as: TYLENOL   albuterol 108 (90 Base) MCG/ACT inhaler Commonly known as: VENTOLIN HFA   allopurinol 100 MG tablet Commonly known as: ZYLOPRIM   Anoro Ellipta 62.5-25 MCG/ACT Aepb Generic drug: umeclidinium-vilanterol   apixaban 5 MG Tabs tablet Commonly known as: ELIQUIS   ARTIFICIAL TEARS OP   azelastine 0.1 % nasal spray Commonly known as: ASTELIN   betamethasone valerate ointment 0.1 % Commonly known as: VALISONE   Calcium Carbonate-Vitamin D 600-400 MG-UNIT tablet   docusate sodium 250 MG capsule Commonly known as: COLACE   furosemide 20 MG tablet Commonly known as: LASIX   levothyroxine 25 MCG tablet Commonly known  as: SYNTHROID   memantine 5 MG tablet Commonly known as: NAMENDA   metoprolol tartrate 25 MG tablet Commonly known as: LOPRESSOR   mirtazapine 30 MG tablet Commonly known as: REMERON   multivitamin tablet       TAKE these medications    haloperidol lactate 5 MG/ML injection Commonly known as: HALDOL Inject 0.1 mLs (0.5 mg total) into the vein every 4 (four) hours as needed (or delirium).   HYDROmorphone 1 MG/ML injection Commonly known as: DILAUDID Inject 0.5-1 mLs (0.5-1 mg total) into the vein every 2 (two) hours as needed for severe pain (or dyspnea).   LORazepam 2 MG/ML injection Commonly known as: ATIVAN Inject 0.5 mLs (1 mg total) into the vein every 4 (four) hours as needed for anxiety.   QUEtiapine 25 MG tablet Commonly known as: SEROQUEL Take 1 tablet (25 mg total) by mouth 2 (two) times daily.               Discharge Care Instructions  (From admission, onward)           Start     Ordered   11/30/22 0000  No dressing needed        11/30/22 1509            Follow-up Information     Janora Norlander, DO Follow up.   Specialty: Family  Medicine Why: As needed Contact information: [place] [id number] [phone]                Allergies  Allergen Reactions   Other Other (See Comments)   Morphine Other (See Comments)    hallucinate     Other Procedures/Studies: No results found.   TODAY-DAY OF DISCHARGE:  Subjective:   Tre Barcellona today was very restless this morning but has stabilized after Dilaudid/Seroquel.  Objective:   Blood pressure (!) 104/53, pulse 81, temperature 97.7 F (36.5 C), temperature source Axillary, resp. rate 17, height 5\' 10"  (1.778 m), weight 75.3 kg, SpO2 91 %.  Intake/Output Summary (Last 24 hours) at 11/30/2022 1509 Last data filed at 11/30/2022 1300 Gross per 24 hour  Intake 563 ml  Output 2000 ml  Net -1437 ml   Filed Weights   11/29/22 1739 11/30/22 0300  Weight:  80.6 kg 75.3 kg    Exam: No new F.N deficits, Normal affect [place].AT,PERRAL Supple Neck,No JVD, No cervical lymphadenopathy appriciated.  Symmetrical Chest wall movement, Good air movement bilaterally, CTAB RRR,No Gallops,Rubs or new Murmurs, No Parasternal Heave +ve B.Sounds, Abd Soft, Non tender, No organomegaly appriciated, No rebound -guarding or rigidity. No Cyanosis, Clubbing or edema, No new Rash or bruise   PERTINENT RADIOLOGIC STUDIES: No results found.   PERTINENT LAB RESULTS: CBC: No results for input(s): "WBC", "HGB", "HCT", "PLT" in the last 72 hours. CMET CMP     Component Value Date/Time   NA 146 (H) 11/30/2022 0354   NA 143 08/10/2022 0948   K 3.6 11/30/2022 0354   CL 107 11/30/2022 0354   CO2 28 11/30/2022 0354   GLUCOSE 121 (H) 11/30/2022 0354   BUN 41 (H) 11/30/2022 0354   BUN 31 (H) 08/10/2022 0948   CREATININE 4.41 (H) 11/30/2022 0354   CREATININE 1.58 (H) 02/07/2013 0903   CALCIUM 11.5 (H) 11/30/2022 0354   PROT 6.0 (L) 11/30/2022 0354   PROT 6.7 08/10/2022 0948   ALBUMIN 3.1 (L) 11/30/2022 0354   ALBUMIN 4.1 08/10/2022 0948   AST 19 11/30/2022 0354   ALT 12 11/30/2022 0354   ALKPHOS 77 11/30/2022 0354   BILITOT 0.7 11/30/2022 0354   BILITOT 0.2 08/10/2022 0948   GFRNONAA 12 (L) 11/30/2022 0354   GFRNONAA 41 (L) 02/07/2013 0903   GFRAA 38 (L) 08/20/2020 0822   GFRAA 47 (L) 02/07/2013 0903    GFR Estimated Creatinine Clearance: 11.7 mL/min (A) (by C-G formula based on SCr of 4.41 mg/dL (H)). No results for input(s): "LIPASE", "AMYLASE" in the last 72 hours. No results for input(s): "CKTOTAL", "CKMB", "CKMBINDEX", "TROPONINI" in the last 72 hours. Invalid input(s): "POCBNP" No results for input(s): "DDIMER" in the last 72 hours. No results for input(s): "HGBA1C" in the last 72 hours. No results for input(s): "CHOL", "HDL", "LDLCALC", "TRIG", "CHOLHDL", "LDLDIRECT" in the last 72 hours. No results for input(s): "TSH", "T4TOTAL", "T3FREE",  "THYROIDAB" in the last 72 hours.  Invalid input(s): "FREET3" No results for input(s): "VITAMINB12", "FOLATE", "FERRITIN", "TIBC", "IRON", "RETICCTPCT" in the last 72 hours. Coags: No results for input(s): "INR" in the last 72 hours.  Invalid input(s): "PT" Microbiology: No results found for this or any previous visit (from the past 240 hour(s)).  FURTHER DISCHARGE INSTRUCTIONS:  Get Medicines reviewed and adjusted: Please take all your medications with you for your next visit with your Primary MD  Laboratory/radiological data: Please request your Primary MD to go over all hospital tests and procedure/radiological results  at the follow up, please ask your Primary MD to get all Hospital records sent to his/her office.  In some cases, they will be blood work, cultures and biopsy results pending at the time of your discharge. Please request that your primary care M.D. goes through all the records of your hospital data and follows up on these results.  Also Note the following: If you experience worsening of your admission symptoms, develop shortness of breath, life threatening emergency, suicidal or homicidal thoughts you must seek medical attention immediately by calling 911 or calling your MD immediately  if symptoms less severe.  You must read complete instructions/literature along with all the possible adverse reactions/side effects for all the Medicines you take and that have been prescribed to you. Take any new Medicines after you have completely understood and accpet all the possible adverse reactions/side effects.   Do not drive when taking Pain medications or sleeping medications (Benzodaizepines)  Do not take more than prescribed Pain, Sleep and Anxiety Medications. It is not advisable to combine anxiety,sleep and pain medications without talking with your primary care practitioner  Special Instructions: If you have smoked or chewed Tobacco  in the last 2 yrs please stop smoking,  stop any regular Alcohol  and or any Recreational drug use.  Wear Seat belts while driving.  Please note: You were cared for by a hospitalist during your hospital stay. Once you are discharged, your primary care physician will handle any further medical issues. Please note that NO REFILLS for any discharge medications will be authorized once you are discharged, as it is imperative that you return to your primary care physician (or establish a relationship with a primary care physician if you do not have one) for your post hospital discharge needs so that they can reassess your need for medications and monitor your lab values.  Total Time spent coordinating discharge including counseling, education and face to face time equals greater than 30 minutes.  SignedOren Binet 11/30/2022 3:09 PM

## 2022-11-30 NOTE — Progress Notes (Signed)
This chaplain responded to Dr. Nena Alexander consult for spiritual care. The Pt. is resting comfortably and the Pt. daughter-Wendy is at the bedside. The chaplain understands [name] and family are anticipating a transition to residential hospice.  Wendy's accepts the chaplain's invitation for story telling and reflective listening. [name] declines prayer and recognizes her father's faith and reliance on scripture. [name] communicates the Pt. wife-[name] talked to the medical team this morning and may appreciate a chaplain visit.    This chaplain is available for F/U spiritual care as needed.  Chaplain Sallyanne Kuster [phone]

## 2022-11-30 NOTE — TOC Initial Note (Signed)
Transition of Care [facility]) - Initial/Assessment Note    Patient Details  Name: Erik Mullins MRN: [id number] Date of Birth: Apr 01, 1931  Transition of Care [facility]) CM/SW Contact:    Benard Halsted, LCSW Phone Number: 11/30/2022, 11:24 AM  Clinical Narrative:                 CSW received consult for hospice facility placement. Spouse requests [facility]) Hospice. CSW spoke with Sung Amabile and she reported a nurse will come to the hospital to assess patient at 1pm.   Expected Discharge Plan: [place] Barriers to Discharge: Hospice Bed not available   Patient Goals and CMS Choice Patient states their goals for this hospitalization and ongoing recovery are:: Comfort CMS Medicare.gov Compare Post Acute Care list provided to:: Patient Represenative (must comment) Choice offered to / list presented to : [place] ownership interest in [facility].provided to:: Spouse    Expected Discharge Plan and Services In-house Referral: Clinical Social Work   Post Acute Care Choice: Residential Hospice Bed Living arrangements for the past 2 months: Single Family Home                                      Prior Living Arrangements/Services Living arrangements for the past 2 months: Single Family Home Lives with:: Spouse Patient language and need for interpreter reviewed:: Yes Do you feel safe going back to the place where you live?: Yes      Need for Family Participation in Patient Care: Yes (Comment) Care giver support system in place?: Yes (comment)   Criminal Activity/Legal Involvement Pertinent to Current Situation/Hospitalization: No - Comment as needed  Activities of Daily Living Home Assistive Devices/Equipment: Shower chair with back, Walker (specify type) ADL Screening (condition at time of admission) Patient's cognitive ability adequate to safely complete daily activities?: No Is the patient deaf or have difficulty hearing?: Yes Does the  patient have difficulty seeing, even when wearing glasses/contacts?: No Does the patient have difficulty concentrating, remembering, or making decisions?: Yes Patient able to express need for assistance with ADLs?: Yes Does the patient have difficulty dressing or bathing?: Yes Independently performs ADLs?: No Communication: Dependent Is this a change from baseline?: Pre-admission baseline Dressing (OT): Dependent Grooming: Dependent Is this a change from baseline?: Pre-admission baseline Feeding: Independent Bathing: Dependent Toileting: Dependent Is this a change from baseline?: Pre-admission baseline In/Out Bed: Dependent Is this a change from baseline?: Pre-admission baseline Walks in Home: Dependent, Needs assistance Is this a change from baseline?: Pre-admission baseline Does the patient have difficulty walking or climbing stairs?: Yes Weakness of Legs: Both Weakness of Arms/Hands: Both  Permission Sought/Granted Permission sought to share information with : Facility Sport and exercise psychologist, Family Supports Permission granted to share information with : Yes, Verbal Permission Granted  Share Information with NAME: [name]  Permission granted to share info w AGENCY: Hospice  Permission granted to share info w Relationship: Spouse  Permission granted to share info w Contact Information: [phone]  Emotional Assessment Appearance:: Appears stated age Attitude/Demeanor/Rapport: Unable to Assess Affect (typically observed): Unable to Assess Orientation: : Oriented to Self Alcohol / Substance Use: Not Applicable Psych Involvement: No (comment)  Admission diagnosis:  Acute renal failure superimposed on stage 3a chronic kidney disease, unspecified acute renal failure type ([place]) [N17.9, N18.31] Patient Active Problem List   Diagnosis Date Noted   PAD (peripheral artery disease) ([place]) 11/29/2022  Hypothyroidism 11/29/2022   Acute renal failure superimposed on stage 3a chronic  kidney disease, unspecified acute renal failure type ([place]) 11/29/2022   Hypokalemia 11/29/2022   Hypercalcemia 11/29/2022   NSTEMI (non-ST elevated myocardial infarction) ([place]) 11/29/2022   Elevated brain natriuretic peptide (BNP) level 11/29/2022   Squamous cell lung cancer, left ([place]) 06/27/2020   Lung nodule 06/20/2020   Vascular dementia without behavioral disturbance ([place]) 01/05/2020   COPD clinically Group B 01/05/2020   Rising PSA following treatment for malignant neoplasm of prostate 09/01/2019   Bilateral deafness 05/26/2019   Memory loss 05/26/2019   Prostate cancer ([place]) 06/01/2017   Osteoporosis screening 05/16/2014   Hyperkalemia, diminished renal excretion 04/06/2014   Thrombocytopenia, unspecified ([place]) 10/24/2013   Gout    Anticoagulation management encounter 09/05/2013   Atrial fibrillation ([place])    History of prostate cancer 08/14/2013   Carotid artery stenosis 06/20/2013   Tobacco user 02/16/2013   Hyperlipidemia 01/14/2010   Essential hypertension, benign 01/14/2010   PSVT 01/14/2010   AAA 01/14/2010   CKD (chronic kidney disease) stage 3, GFR 30-59 ml/min ([place]) 01/14/2010   PCP:  Janora Norlander, DO Pharmacy:   [id number] - [place], [place] [address] Phone: [phone] Fax: [phone]     Social Determinants of Health ([place]form) Social History: SDOH Screenings   Food Insecurity: Patient Unable To Answer (11/29/2022)  Housing: Low Risk  (11/29/2022)  Transportation Needs: Patient Unable To Answer (11/29/2022)  Utilities: Patient Unable To Answer (11/29/2022)  Alcohol Screen: Low Risk  (07/28/2021)  Depression (PHQ2-9): High Risk (08/10/2022)  Financial Resource Strain: Low Risk  (07/28/2021)  Physical Activity: Inactive (07/28/2021)  Social Connections: Socially Integrated (07/28/2021)  Stress: No Stress Concern Present (07/28/2021)  Tobacco Use: Medium Risk (10/08/2022)   SDOH  Interventions:     Readmission Risk Interventions     No data to display

## 2022-11-30 NOTE — TOC Transition Note (Signed)
Transition of Care [facility]) - CM/SW Discharge Note   Patient Details  Name: Erik Mullins MRN: [id number] Date of Birth: 1930/12/10  [facility] CM/SW Contact:  Benard Halsted, LCSW Phone Number: 11/30/2022, 3:48 PM   Clinical Narrative:    Patient will DC to: [facility] Anticipated DC date: 11/30/22 Family notified: Spouse Transport by: Corey Harold   Per MD patient ready for DC to [facility]. RN, patient, patient's family, and facility notified of DC. Discharge Summary sent to facility. DC packet on chart including signed DNR. Ambulance transport requested for patient.   CSW will sign off for now as social work intervention is no longer needed. Please consult [place] again if new needs arise.     Final next level of care: [place] Barriers to Discharge: Barriers Resolved   Patient Goals and CMS Choice CMS Medicare.gov Compare Post Acute Care list provided to:: Patient Represenative (must comment) Choice offered to / list presented to : Spouse  Discharge Placement                Patient chooses bed at:  [facility] Patient to be transferred to facility by: [place] Name of family member notified: Spouse Patient and family notified of of transfer: 11/30/22  Discharge Plan and Services Additional resources added to the After Visit Summary for   In-house Referral: Clinical Social Work   Post Acute Care Choice: Residential Hospice Bed                               Social Determinants of Health (Agenda) Interventions [place]: Patient Unable To Answer (11/29/2022)  Housing: Low Risk  (11/29/2022)  Transportation Needs: Patient Unable To Answer (11/29/2022)  Utilities: Patient Unable To Answer (11/29/2022)  Alcohol Screen: Low Risk  (07/28/2021)  Depression (PHQ2-9): High Risk (08/10/2022)  Financial Resource Strain: Low Risk  (07/28/2021)  Physical Activity: Inactive (07/28/2021)  Social  Connections: Socially Integrated (07/28/2021)  Stress: No Stress Concern Present (07/28/2021)  Tobacco Use: Medium Risk (10/08/2022)     Readmission Risk Interventions     No data to display

## 2022-12-01 LAB — PTH, INTACT AND CALCIUM
Calcium, Total (PTH): 12 mg/dL — ABNORMAL HIGH (ref 8.6–10.2)
PTH: 10 pg/mL — ABNORMAL LOW (ref 15–65)

## 2022-12-02 ENCOUNTER — Telehealth: Payer: Self-pay | Admitting: Family Medicine

## 2022-12-02 NOTE — Telephone Encounter (Signed)
Thank you for the update!

## 2022-12-04 ENCOUNTER — Ambulatory Visit: Payer: Medicare Other | Admitting: Family Medicine

## 2022-12-09 ENCOUNTER — Ambulatory Visit: Payer: Medicare Other | Admitting: Family Medicine

## 2022-12-14 DEATH — deceased

## 2023-03-30 ENCOUNTER — Other Ambulatory Visit: Payer: Medicare Other

## 2023-04-08 ENCOUNTER — Ambulatory Visit: Payer: Medicare Other | Admitting: Urology
